# Patient Record
Sex: Male | Born: 1945
Health system: Southern US, Community
[De-identification: ages and names within clinical notes are randomized; demographics above are authoritative.]

## PROBLEM LIST (undated history)

## (undated) DIAGNOSIS — B20 Human immunodeficiency virus [HIV] disease: Secondary | ICD-10-CM

## (undated) DIAGNOSIS — C8590 Non-Hodgkin lymphoma, unspecified, unspecified site: Secondary | ICD-10-CM

## (undated) DIAGNOSIS — Z21 Asymptomatic human immunodeficiency virus [HIV] infection status: Secondary | ICD-10-CM

## (undated) DIAGNOSIS — A6920 Lyme disease, unspecified: Secondary | ICD-10-CM

## (undated) DIAGNOSIS — K900 Celiac disease: Secondary | ICD-10-CM

## (undated) HISTORY — DX: Celiac disease: K90.0

## (undated) HISTORY — DX: Human immunodeficiency virus (HIV) disease: B20

## (undated) HISTORY — DX: Asymptomatic human immunodeficiency virus (hiv) infection status: Z21

---

## 2015-06-14 DIAGNOSIS — H2510 Age-related nuclear cataract, unspecified eye: Secondary | ICD-10-CM | POA: Insufficient documentation

## 2016-04-19 DIAGNOSIS — L03211 Cellulitis of face: Secondary | ICD-10-CM | POA: Insufficient documentation

## 2016-04-19 HISTORY — DX: Cellulitis of face: L03.211

## 2016-04-20 DIAGNOSIS — N529 Male erectile dysfunction, unspecified: Secondary | ICD-10-CM | POA: Insufficient documentation

## 2016-04-20 DIAGNOSIS — E785 Hyperlipidemia, unspecified: Secondary | ICD-10-CM | POA: Insufficient documentation

## 2017-08-14 LAB — HM HEPATITIS C SCREENING LAB: HM Hepatitis Screen: NEGATIVE

## 2018-02-17 LAB — CBC AND DIFFERENTIAL
HCT: 30 — AB (ref 41–53)
Hemoglobin: 8.8 — AB (ref 13.5–17.5)

## 2018-02-17 LAB — IRON,TIBC AND FERRITIN PANEL
%SAT: 4
Iron: 17
TIBC: 425

## 2018-02-17 LAB — VITAMIN B12: Vitamin B-12: 229

## 2018-05-19 LAB — CBC AND DIFFERENTIAL
HCT: 37 — AB (ref 41–53)
Hemoglobin: 12 — AB (ref 13.5–17.5)
WBC: 3.8

## 2018-08-15 LAB — HM COLONOSCOPY

## 2018-08-19 LAB — CBC AND DIFFERENTIAL
HCT: 40 — AB (ref 41–53)
Hemoglobin: 13.8 (ref 13.5–17.5)
Platelets: 236 (ref 150–399)
WBC: 4.4

## 2018-08-19 LAB — TSH: TSH: 5.12 (ref 0.41–5.90)

## 2018-08-19 LAB — LIPID PANEL
Cholesterol: 159 (ref 0–200)
HDL: 31 — AB (ref 35–70)
LDL Cholesterol: 109
Triglycerides: 91 (ref 40–160)

## 2018-08-19 LAB — HEPATIC FUNCTION PANEL
ALT: 10 (ref 10–40)
AST: 21 (ref 14–40)
Alkaline Phosphatase: 82 (ref 25–125)
Bilirubin, Total: 0.5

## 2018-08-19 LAB — COMPREHENSIVE METABOLIC PANEL
Albumin: 3.5 (ref 3.5–5.0)
Calcium: 8.7 (ref 8.7–10.7)
Globulin: 3.5

## 2018-08-19 LAB — BASIC METABOLIC PANEL
BUN: 23 — AB (ref 4–21)
CO2: 27 — AB (ref 13–22)
Chloride: 108 (ref 99–108)
Creatinine: 1 (ref 0.6–1.3)
Glucose: 88
Potassium: 4.1 (ref 3.4–5.3)
Sodium: 141 (ref 137–147)

## 2018-08-19 LAB — PSA: PSA: 1.7

## 2020-02-18 ENCOUNTER — Ambulatory Visit: Payer: Self-pay | Admitting: Family Medicine

## 2020-02-24 ENCOUNTER — Ambulatory Visit: Payer: Self-pay | Admitting: Family Medicine

## 2020-03-01 ENCOUNTER — Telehealth: Payer: Self-pay

## 2020-03-01 NOTE — Telephone Encounter (Signed)
Copied from [facility] [phone] [id number]. Topic: General - Inquiry >> Feb 29, 2020  3:54 PM Gillis Ends D wrote: Reason for CRM: Patient believes that he has walking pneumonia and wants to know if it is something he can take besides advil. He asks that the nurse calls him back as soon as she can. Please advise

## 2020-03-01 NOTE — Telephone Encounter (Signed)
Called patient back he is new patient on 03/10/2020, informed him that we can not give any medical advise before seeing Physical but can get evaluated by Urgent care or can be seen sooner, he will wait till his appointment time for next week.

## 2020-03-01 NOTE — Telephone Encounter (Signed)
Thank you. Yes, we cannot treat a new patient before their appointment.  Urgent Care is an excellent place to diagnose and treat Walking Pneumonia if he has problem before 03/10/20  Nobie Putnam, [facility] 03/01/2020, 12:11 PM

## 2020-03-10 ENCOUNTER — Ambulatory Visit (INDEPENDENT_AMBULATORY_CARE_PROVIDER_SITE_OTHER): Payer: Medicare HMO | Admitting: Family Medicine

## 2020-03-10 ENCOUNTER — Telehealth: Payer: Self-pay | Admitting: Family Medicine

## 2020-03-10 ENCOUNTER — Encounter: Payer: Self-pay | Admitting: Family Medicine

## 2020-03-10 ENCOUNTER — Other Ambulatory Visit: Payer: Self-pay

## 2020-03-10 VITALS — BP 92/54 | HR 82 | Temp 97.5°F | Resp 16 | Ht 70.0 in | Wt 158.6 lb

## 2020-03-10 DIAGNOSIS — D508 Other iron deficiency anemias: Secondary | ICD-10-CM

## 2020-03-10 DIAGNOSIS — E559 Vitamin D deficiency, unspecified: Secondary | ICD-10-CM

## 2020-03-10 DIAGNOSIS — K900 Celiac disease: Secondary | ICD-10-CM

## 2020-03-10 DIAGNOSIS — E538 Deficiency of other specified B group vitamins: Secondary | ICD-10-CM | POA: Diagnosis not present

## 2020-03-10 DIAGNOSIS — R5382 Chronic fatigue, unspecified: Secondary | ICD-10-CM | POA: Diagnosis not present

## 2020-03-10 DIAGNOSIS — D649 Anemia, unspecified: Secondary | ICD-10-CM

## 2020-03-10 NOTE — Telephone Encounter (Signed)
Patient is calling to report that he received his [place] vaccine 09/21/19, 10/12/19 Please advise CB- [phone]

## 2020-03-10 NOTE — Patient Instructions (Addendum)
Thank you for coming to the office today.  Labs today, stay tuned for results.  We will advise based on the results how to treat - if there is an anemia or deficiency  Probably regardless of result we can refer - but once we see results we will let you know.  [facility]) [place], [place] [address] Phone: [phone]  [facility]) [place]. [place], [place] [address] Main: [phone]  Please schedule a Follow-up Appointment to: Return in about 3 months (around 06/10/2020) for 3 month follow-up fatigue / celiac / GI / iron deficiency.  If you have any other questions or concerns, please feel free to call the office or send a message through [facility]. You may also schedule an earlier appointment if necessary.  Additionally, you may be receiving a survey about your experience at our office within a few days to 1 week by e-mail or mail. We value your feedback.  Justin Putnam, DO [place]

## 2020-03-10 NOTE — Telephone Encounter (Signed)
Patient is calling to ask has he signed a release for medical records? Patient is request records to request from his previous office [facility] Fax Number [phone] Telephone number- [phone]

## 2020-03-10 NOTE — Progress Notes (Signed)
Subjective:    Patient ID: Justin Wallace, male    DOB: 02/26/1946, 74 y.o.   MRN: [id number]  Justin Wallace is a 74 y.o. male presenting on 03/10/2020 for Fever (as per patient had self diagnosed for walking pneumonia since had low grade fever, chills, bodyache, occ cough, HA but don't last long denies sore throat onset 2 weeks getting improve from yesterday)  Moved from [place], has children/grandkids in this area.  HPI   Fatigue / Reduced Energy Follow-up Low Grade Fever / Chills Bodyache Cough  He was tested NEGATIVE for COVID19 2 weeks ago after not feeling well with symptoms He felt generalized malaise and fatigue, soreness, aching, reduced energy - the respiratory symptoms were very mild or minor, and he does not endorse significant cough or shortness of breath. Yesterday symptoms started feeling better He says self diagnosed based on prior history of "walking pneumonia" in past. However, he seems to endorse more chronic symptoms, Gradual over past 3 years, some decline in his function, he is very active, plays pickleball, and outdoor activities with yardwork  Mild low BP, usually lower but this seems lower than before.  Celiac Disease Weight Loss Diagnosed by [facility] back in 07/2018 on colonoscopy with biopsies and lab confirm. Also had EGD at that time with biopsy see results below. Has major changes to his diet, with gluten free overall He is interested to see GI locally now prefers [place].   Health Maintenance:  Colon CA Screening - see above  COVID19 vaccine UTD, Pfizer - will submit dates to [place] when available.  Screening Hepatitis C antibody was negative 08/14/17 - [facility]  Depression screen PHQ 2/9 03/10/2020  Decreased Interest 0  Down, Depressed, Hopeless 0  PHQ - 2 Score 0    Past Medical History:  Diagnosis Date  . Celiac disease    History reviewed. No pertinent surgical history. Social History   Socioeconomic History  . Marital status: Married    Spouse  name: Not on file  . Number of children: Not on file  . Years of education: Not on file  . Highest education level: Not on file  Occupational History  . Not on file  Tobacco Use  . Smoking status: Never Smoker  . Smokeless tobacco: Never Used  Substance and Sexual Activity  . Alcohol use: Not Currently  . Drug use: Never  . Sexual activity: Never  Other Topics Concern  . Not on file  Social History Narrative  . Not on file   Social Determinants of Health   Financial Resource Strain:   . Difficulty of Paying Living Expenses:   Food Insecurity:   . Worried About Charity fundraiser in the Last Year:   . Arboriculturist in the Last Year:   Transportation Needs:   . Film/video editor (Medical):   Marland Kitchen Lack of Transportation (Non-Medical):   Physical Activity:   . Days of Exercise per Week:   . Minutes of Exercise per Session:   Stress:   . Feeling of Stress :   Social Connections:   . Frequency of Communication with Friends and Family:   . Frequency of Social Gatherings with Friends and Family:   . Attends Religious Services:   . Active Member of Clubs or Organizations:   . Attends Archivist Meetings:   Marland Kitchen Marital Status:   Intimate Partner Violence:   . Fear of Current or Ex-Partner:   . Emotionally Abused:   Marland Kitchen Physically  Abused:   . Sexually Abused:    History reviewed. No pertinent family history. Current Outpatient Medications on File Prior to Visit  Medication Sig  . ferrous sulfate 325 (65 FE) MG EC tablet Take by mouth.  . Multiple Vitamin (THERA) TABS Take 1 tablet by mouth daily.  . tadalafil (CIALIS) 20 MG tablet May take 1/2 to 1 tablet as needed.   No current facility-administered medications on file prior to visit.    Review of Systems Per HPI unless specifically indicated above      Objective:    BP (!) 92/54   Pulse 82   Temp (!) 97.5 F (36.4 C) (Temporal)   Resp 16   Ht 5' 10"  (1.778 m)   Wt 158 lb 9.6 oz (71.9 kg)   SpO2  99%   BMI 22.76 kg/m   Wt Readings from Last 3 Encounters:  03/10/20 158 lb 9.6 oz (71.9 kg)    Physical Exam Vitals and nursing note reviewed.  Constitutional:      General: He is not in acute distress.    Appearance: He is well-developed. He is not diaphoretic.     Comments: Well-appearing, comfortable, cooperative  HENT:     Head: Normocephalic and atraumatic.  Eyes:     General:        Right eye: No discharge.        Left eye: No discharge.     Conjunctiva/sclera: Conjunctivae normal.  Neck:     Thyroid: No thyromegaly.  Cardiovascular:     Rate and Rhythm: Normal rate and regular rhythm.     Heart sounds: Murmur (1-2 / 6 left sternal border systolic, some radiation to L carotid) heard.   Pulmonary:     Effort: Pulmonary effort is normal. No respiratory distress.     Breath sounds: Normal breath sounds. No wheezing or rales.  Musculoskeletal:        General: Normal range of motion.     Cervical back: Normal range of motion and neck supple.     Right lower leg: No edema.     Left lower leg: No edema.  Lymphadenopathy:     Cervical: No cervical adenopathy.  Skin:    General: Skin is warm and dry.     Findings: No erythema or rash.  Neurological:     Mental Status: He is alert and oriented to person, place, and time.  Psychiatric:        Behavior: Behavior normal.     Comments: Well groomed, good eye contact, normal speech and thoughts         Results for orders placed or performed in visit on 03/10/20  HM HEPATITIS C SCREENING LAB  Result Value Ref Range   HM Hepatitis Screen Negative-Validated   HM COLONOSCOPY  Result Value Ref Range   HM Colonoscopy See Report (in chart) See Report (in chart), Patient Reported       Colonoscopy  Anatomical Region Laterality Modality  -- -- Endoscopy  Impression  External hemorrhoids.   Procedure: Colonoscopy   Date of procedure: 08/15/2018 Physician: Sharen Hint H: Proceduralist   Recommendations:   Pending path from upper GI endoscopy may need small bowel capsule exam    Findings:  All observed locations appeared normal.  Protruding and external hemorrhoids with no bleeding       Procedure Information   Procedure Details: The patient underwent general anesthesia, which was  administered by an anesthesia professional. The patient's blood pressure,  heart  rate, level of consciousness, oxygen and respirations were monitored  throughout the procedure. A digital rectal exam was performed. The scope  was introduced through the anus and advanced to the terminal ileum.  Photodocumentation was obtained at the ileocecal valve, appendiceal  orifice and terminal ileum. Retroflexion was performed in the cecum. Bowel  prep was adequate. The patient experienced no blood loss. The procedure  was not difficult. The patient tolerated the procedure well.There were no  apparent complications.   Scope Times:  Insertion Time: 11m59s  Withdrawal Time: 720m4s   Nurse-administered sedation medications (excludes anesthesia  provider-administered medications) (Filter: Administrations occurring from  08/15/18 0827 to 08/15/18 1007) As of 08/15/18 1007   LR infusion (mL/hr) Total volume: Not documented*  *Total volume has not been documented. View each administration to see the  amount administered.   Dosing weight: 77.1 kg  Dose Action RouteAdmin Date/Time  Admin User  25 mL/hr New Bag IntraVENous 08/15/18 0909 SyRoxanne GatesRN    --------------------------------------------------  Specimens:  ID Type Source Tests Collected by Time  1 : Bulbar Polyps bx Tissue Duodenum, Biopsy PATHOLOGY TISSUE REQUEST JoEustaquio MaizeMD 08/15/2018 9:43 AM  2 : (R/O H Pylori) Tissue Gastric Antrum, Biopsy PATHOLOGY TISSUE REQUEST  JoEustaquio MaizeMD 08/15/2018 9:44 AM    Instrument/Scope:  GATheophilus [id number] [id number] Pathology Tissue  Request Specimen:  Tissue - Duodenal structure (body structure), Tissue specimen (specimen) - Stomach structure (bod... Component 1 yr ago  Case Report  Surgical Pathology                Case: [id number]                  Authorizing Provider: JoEustaquio MaizeMD    Collected:      08/15/2018 0943        [facility]  Received:      08/15/2018 1023        Pathologist:      MiJeral Pinch                                     MD                                      Specimens:  1) - Duodenum, Biopsy, Bulbar Polyps bx                               2) - Gastric Antrum, Biopsy, (R/O H Pylori)                         Addendum 1    A Helicobacter pylori immunostain is negative for organisms.  Addendum electronically signed by MiJeral PinchMD on 08/19/2018 at 11:56 AM  Final Diagnosis    1.  Duodenum, biopsy: --Increased intraepithelial lymphocytes with villous blunting and prominent lymphoid aggregates with focal giant cells (see comment).    2.  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 --A Helicobacter pylori immunostain will be performed with the results to be reported in an addendum.  Electronically signed by MiJeral PinchMD  on 08/18/2018 at 2:30 PM    Celiac Ab tTG DGP TIgA   Ref Range & Units 1 yr ago Comments  IgA 61 - 437 mg/dL 436        Deamidated Gliadin Abs, IgA 0 - 19 units 4           Negative          0 - 19           Weak Positive       20 - 30           Moderate to Strong Positive  >30      Deamidated Gliadin Abs, IgG 0 - 19 units 3           Negative          0 - 19           Weak Positive        20 - 30           Moderate to Strong Positive  >30      TTG IgA 0 - 3 U/mL <2                Negative    0 - 3                Weak Positive  4 - 10                Positive      >10  Tissue Transglutaminase (tTG) has been identified  as the endomysial antigen. Studies have demonstr-  ated that endomysial IgA antibodies have over 99%  specificity for gluten sensitive enteropathy.      Tissue Transglut Ab 0 - 5 U/mL 14High                Negative    0 - 5                Weak Positive  6 - 9                Positive      >9      Resulting Agency  [facility]   Narrative Performed by [facility] Performed at: [facility]  [address], [place], [place] [id number]  Lab Director: Rush Farmer MD, Phone: [phone] Specimen Collected: 08/26/18 8:14 AM     Assessment & Plan:   Problem List Items Addressed This Visit    None    Visit Diagnoses    Celiac disease    -  Primary   Relevant Orders   CBC with Differential/Platelet   COMPLETE METABOLIC PANEL WITH GFR   Iron, TIBC and Ferritin Panel   Vitamin B12   VITAMIN D 25 Hydroxy (Vit-D Deficiency, Fractures)   Folate   Other iron deficiency anemia       Relevant Medications   ferrous sulfate 325 (65 FE) MG EC tablet   Other Relevant Orders   Iron, TIBC and Ferritin Panel   Vitamin B12   Folate   Vitamin D deficiency       Relevant Orders   VITAMIN D 25 Hydroxy (Vit-D Deficiency, Fractures)   Vitamin B12 nutritional deficiency       Relevant Orders   Vitamin B12   Folate   Chronic fatigue       Relevant Orders   CBC with Differential/Platelet   COMPLETE METABOLIC PANEL WITH GFR      Request records from  prior PCP Dr Scarlette Ar, who has relocated after leaving [place] in 2019.  Clinical constellation of fatigue, reduced energy, also some initially reported mild  respiratory symptoms however those have resolved. He had NEGATIVE COVID19 test recently. UTD on COVID vaccination [employer]), some of his symptoms seem to be more chronic and gradual in nature.  Based on prior history Celiac disease dx in 2020 on colonoscopy biopsy/testing, he has concerns with iron deficiency anemia, prior results in recent history have shown significant anemia Hgb 9-10 range by report.  No prior documentation on nutritional vitamin deficiency.  No sign of active respiratory disease at this time. Afebrile.  Plan - Initiate lab work up today for source of his symptoms, no recent comparison labs, will check CMET CBC, Anemia Panel, Vitamin B12 and Folate, Vitamin D  Based on results, will determine if any significant nutritional deficiency or anemia and will treat accordingly, anticipate referral to [facility] once labs are reviewed given history of Celiac and some persistent fatigue. Prior GI record notes he may warrant capsule endoscopy in future.  #Possible heart murmur Identified possible new heart murmur today on exam Will follow-up future exams, based on rest of exam and history seems unlikely to be cardiovascular etiology of his current symptoms. Defer further cardiac work up today.  No orders of the defined types were placed in this encounter.  Orders Placed This Encounter  Procedures  . CBC with Differential/Platelet  . COMPLETE METABOLIC PANEL WITH GFR  . Iron, TIBC and Ferritin Panel  . Vitamin B12  . VITAMIN D 25 Hydroxy (Vit-D Deficiency, Fractures)  . Folate  . HM HEPATITIS C SCREENING LAB    This external order was created through the Results Console.  Marland Kitchen HM COLONOSCOPY    This external order was created through the Results Console.    Follow up plan: Return in about 3 months (around 06/10/2020) for 3 month follow-up fatigue / celiac / GI / iron deficiency.  Nobie Putnam, [facility] 03/10/2020,  10:36 AM

## 2020-03-11 LAB — COMPLETE METABOLIC PANEL WITH GFR
AG Ratio: 0.7 (calc) — ABNORMAL LOW (ref 1.0–2.5)
ALT: 13 U/L (ref 9–46)
AST: 20 U/L (ref 10–35)
Albumin: 2.9 g/dL — ABNORMAL LOW (ref 3.6–5.1)
Alkaline phosphatase (APISO): 141 U/L (ref 35–144)
BUN: 23 mg/dL (ref 7–25)
CO2: 27 mmol/L (ref 20–32)
Calcium: 8.4 mg/dL — ABNORMAL LOW (ref 8.6–10.3)
Chloride: 106 mmol/L (ref 98–110)
Creat: 0.91 mg/dL (ref 0.70–1.18)
GFR, Est African American: 98 mL/min/{1.73_m2} (ref >=60)
GFR, Est Non African American: 84 mL/min/{1.73_m2} (ref >=60)
Globulin: 3.9 g/dL (calc) — ABNORMAL HIGH (ref 1.9–3.7)
Glucose, Bld: 94 mg/dL (ref 65–99)
Potassium: 4.8 mmol/L (ref 3.5–5.3)
Sodium: 138 mmol/L (ref 135–146)
Total Bilirubin: 0.4 mg/dL (ref 0.2–1.2)
Total Protein: 6.8 g/dL (ref 6.1–8.1)

## 2020-03-11 LAB — VITAMIN B12: Vitamin B-12: 302 pg/mL (ref 200–1100)

## 2020-03-11 LAB — CBC WITH DIFFERENTIAL/PLATELET
Absolute Monocytes: 706 cells/uL (ref 200–950)
Basophils Absolute: 49 cells/uL (ref 0–200)
Basophils Relative: 1 %
Eosinophils Absolute: 78 cells/uL (ref 15–500)
Eosinophils Relative: 1.6 %
HCT: 40.9 % (ref 38.5–50.0)
Hemoglobin: 13.1 g/dL — ABNORMAL LOW (ref 13.2–17.1)
Lymphs Abs: 931 cells/uL (ref 850–3900)
MCH: 28.2 pg (ref 27.0–33.0)
MCHC: 32 g/dL (ref 32.0–36.0)
MCV: 88 fL (ref 80.0–100.0)
MPV: 10.5 fL (ref 7.5–12.5)
Monocytes Relative: 14.4 %
Neutro Abs: 3136 cells/uL (ref 1500–7800)
Neutrophils Relative %: 64 %
Platelets: 349 10*3/uL (ref 140–400)
RBC: 4.65 10*6/uL (ref 4.20–5.80)
RDW: 12 % (ref 11.0–15.0)
Total Lymphocyte: 19 %
WBC: 4.9 10*3/uL (ref 3.8–10.8)

## 2020-03-11 LAB — VITAMIN D 25 HYDROXY (VIT D DEFICIENCY, FRACTURES): Vit D, 25-Hydroxy: 43 ng/mL (ref 30–100)

## 2020-03-11 LAB — IRON,TIBC AND FERRITIN PANEL
%SAT: 8 % (calc) — ABNORMAL LOW (ref 20–48)
Ferritin: 218 ng/mL (ref 24–380)
Iron: 24 ug/dL — ABNORMAL LOW (ref 50–180)
TIBC: 291 mcg/dL (calc) (ref 250–425)

## 2020-03-11 LAB — FOLATE: Folate: 6.1 ng/mL

## 2020-03-11 NOTE — Addendum Note (Signed)
Addended by: Olin Hauser on: 03/11/2020 07:07 PM   Modules accepted: Orders

## 2020-03-15 NOTE — Telephone Encounter (Signed)
Copied from [facility] [id number]. Topic: General - Other >> Mar 15, 2020 12:40 PM Leward Quan A wrote: Reason for CRM: Patient called to inform Dr Raliegh Ip that the GI Dr that he was referred to will not be able to see him until October. He states that he is not getting any better say he is still weak and would love to see someone this week or next. He is asking Dr Raliegh Ip for what ever help he can give Ph# [phone]

## 2020-03-15 NOTE — Telephone Encounter (Signed)
I called patient.  He has symptomatic anemia with iron deficiency.  His B12 and Folate were normal.  I advised him he should keep his new patient apt with Dr Allen Norris on 05/18/20 for GI consultation for Celiac.  However, he should get a consultation sooner now with Hematology for his anemia, perhaps that is one of main causes of his weakness and they can give [place] further advice and treat his current problem.  I advised for now, we will hold off on switching GI, but we can consider switching it if he absolutely needs it.  Referral sent to [facility] - he will take first available [facility] or [place] site.  Nobie Putnam, [facility] 03/15/2020, 6:04 PM

## 2020-03-17 ENCOUNTER — Inpatient Hospital Stay: Payer: Medicare HMO

## 2020-03-17 ENCOUNTER — Encounter: Payer: Self-pay | Admitting: Oncology

## 2020-03-17 ENCOUNTER — Other Ambulatory Visit: Payer: Self-pay

## 2020-03-17 ENCOUNTER — Inpatient Hospital Stay: Payer: Medicare HMO | Attending: Oncology | Admitting: Oncology

## 2020-03-17 VITALS — BP 102/56 | HR 82 | Temp 98.8°F | Wt 153.6 lb

## 2020-03-17 DIAGNOSIS — D649 Anemia, unspecified: Secondary | ICD-10-CM

## 2020-03-17 DIAGNOSIS — R5383 Other fatigue: Secondary | ICD-10-CM

## 2020-03-17 DIAGNOSIS — R634 Abnormal weight loss: Secondary | ICD-10-CM | POA: Insufficient documentation

## 2020-03-17 DIAGNOSIS — R509 Fever, unspecified: Secondary | ICD-10-CM | POA: Diagnosis not present

## 2020-03-17 DIAGNOSIS — K900 Celiac disease: Secondary | ICD-10-CM | POA: Insufficient documentation

## 2020-03-17 DIAGNOSIS — R519 Headache, unspecified: Secondary | ICD-10-CM | POA: Diagnosis not present

## 2020-03-17 LAB — RETIC PANEL
Immature Retic Fract: 1.3 % — ABNORMAL LOW (ref 2.3–15.9)
RBC.: 4.55 MIL/uL (ref 4.22–5.81)
Retic Count, Absolute: 41 10*3/uL (ref 19.0–186.0)
Retic Ct Pct: 0.9 % (ref 0.4–3.1)
Reticulocyte Hemoglobin: 30.2 pg (ref >27.9)

## 2020-03-17 LAB — CBC WITH DIFFERENTIAL/PLATELET
Abs Immature Granulocytes: 0.09 10*3/uL — ABNORMAL HIGH (ref 0.00–0.07)
Basophils Absolute: 0.1 10*3/uL (ref 0.0–0.1)
Basophils Relative: 1 %
Eosinophils Absolute: 0.1 10*3/uL (ref 0.0–0.5)
Eosinophils Relative: 1 %
HCT: 38.2 % — ABNORMAL LOW (ref 39.0–52.0)
Hemoglobin: 13 g/dL (ref 13.0–17.0)
Immature Granulocytes: 1 %
Lymphocytes Relative: 15 %
Lymphs Abs: 1 10*3/uL (ref 0.7–4.0)
MCH: 29 pg (ref 26.0–34.0)
MCHC: 34 g/dL (ref 30.0–36.0)
MCV: 85.3 fL (ref 80.0–100.0)
Monocytes Absolute: 0.8 10*3/uL (ref 0.1–1.0)
Monocytes Relative: 12 %
Neutro Abs: 4.7 10*3/uL (ref 1.7–7.7)
Neutrophils Relative %: 70 %
Platelets: 337 10*3/uL (ref 150–400)
RBC: 4.48 MIL/uL (ref 4.22–5.81)
RDW: 13 % (ref 11.5–15.5)
WBC: 6.8 10*3/uL (ref 4.0–10.5)
nRBC: 0 % (ref 0.0–0.2)

## 2020-03-17 LAB — COMPREHENSIVE METABOLIC PANEL
ALT: 18 U/L (ref 0–44)
AST: 24 U/L (ref 15–41)
Albumin: 2.7 g/dL — ABNORMAL LOW (ref 3.5–5.0)
Alkaline Phosphatase: 140 U/L — ABNORMAL HIGH (ref 38–126)
Anion gap: 6 (ref 5–15)
BUN: 21 mg/dL (ref 8–23)
CO2: 27 mmol/L (ref 22–32)
Calcium: 8.1 mg/dL — ABNORMAL LOW (ref 8.9–10.3)
Chloride: 104 mmol/L (ref 98–111)
Creatinine, Ser: 1.09 mg/dL (ref 0.61–1.24)
GFR calc Af Amer: >60 mL/min (ref >60)
GFR calc non Af Amer: >60 mL/min (ref >60)
Glucose, Bld: 97 mg/dL (ref 70–99)
Potassium: 3.8 mmol/L (ref 3.5–5.1)
Sodium: 137 mmol/L (ref 135–145)
Total Bilirubin: 0.3 mg/dL (ref 0.3–1.2)
Total Protein: 8 g/dL (ref 6.5–8.1)

## 2020-03-17 LAB — LACTATE DEHYDROGENASE: LDH: 113 U/L (ref 98–192)

## 2020-03-17 LAB — HEPATITIS PANEL, ACUTE
HCV Ab: NONREACTIVE
Hep A IgM: NONREACTIVE
Hep B C IgM: NONREACTIVE
Hepatitis B Surface Ag: NONREACTIVE

## 2020-03-17 LAB — IRON AND TIBC
Iron: 22 ug/dL — ABNORMAL LOW (ref 45–182)
Saturation Ratios: 8 % — ABNORMAL LOW (ref 17.9–39.5)
TIBC: 286 ug/dL (ref 250–450)
UIBC: 264 ug/dL

## 2020-03-17 LAB — FERRITIN: Ferritin: 284 ng/mL (ref 24–336)

## 2020-03-17 LAB — TECHNOLOGIST SMEAR REVIEW: Plt Morphology: ADEQUATE

## 2020-03-17 LAB — TSH: TSH: 4.776 u[IU]/mL — ABNORMAL HIGH (ref 0.350–4.500)

## 2020-03-17 NOTE — Progress Notes (Signed)
Patient here to establish care. Pt reports having severe fatigue and stomach cramps.

## 2020-03-17 NOTE — Progress Notes (Signed)
Hematology/Oncology Consult note [facility] [phone] [phone]   Patient Care Team: Olin Hauser, DO as PCP - General (Family Medicine)  REFERRING PROVIDER: Nobie Putnam *  CHIEF COMPLAINTS/REASON FOR VISIT:  Evaluation of symptomatic anemia  HISTORY OF PRESENTING ILLNESS:   Justin Wallace is a  74 y.o.  male with PMH listed below was seen in consultation at the request of  Nobie Putnam *  for evaluation of symptomatic anemia Patient reported history of sudden onset of diffuse body aches, decreased appetite, headache low-grade fever, profound weakness/fatigue about 2 weeks ago.  Prior to the onset of symptoms, he went to a funeral as well as applicable clinic.  He reports that he has received COVID-19 vaccination previously.  He moved from [place].  Daughter is an Therapist, sports and told him to get COVID-19 PCR checked and he was tested negative. Patient also reports a sudden drop of weight since the onset of symptoms.  Patient has a history of celiac disease.  No colonoscopy records or pathology records in current EMR.  Diagnosis was done by [facility] gastroenterology.  Patient reports that he was in his usual state of health until the onset of symptoms.  03/10/2020, patient was evaluated by primary care provider.  Blood work showed mild anemia with hemoglobin of 13.1, Iron panel showed decreased saturation of 8, ferritin 218, TIBC 291, patient was referred to hematology for evaluation of symptomatic anemia and iron deficiency.  Patient reports that her body aches, fever and headache symptoms have improved.  However he continues to feel very tired and fatigued.  Appetite has decreased and weight remains low.   Review of Systems  Constitutional: Positive for fatigue. Negative for appetite change, chills, fever and unexpected weight change.  HENT:   Negative for hearing loss and voice change.   Eyes: Negative for eye problems  and icterus.  Respiratory: Negative for chest tightness, cough and shortness of breath.   Cardiovascular: Negative for chest pain and leg swelling.  Gastrointestinal: Negative for abdominal distention and abdominal pain.  Endocrine: Negative for hot flashes.  Genitourinary: Negative for difficulty urinating, dysuria and frequency.   Musculoskeletal: Negative for arthralgias.  Skin: Negative for itching and rash.  Neurological: Negative for light-headedness and numbness.  Hematological: Negative for adenopathy. Does not bruise/bleed easily.  Psychiatric/Behavioral: Negative for confusion.    MEDICAL HISTORY:  Past Medical History:  Diagnosis Date  . Celiac disease     SURGICAL HISTORY: History reviewed. No pertinent surgical history.  SOCIAL HISTORY: Social History   Socioeconomic History  . Marital status: Married    Spouse name: Not on file  . Number of children: Not on file  . Years of education: Not on file  . Highest education level: Not on file  Occupational History  . Not on file  Tobacco Use  . Smoking status: Never Smoker  . Smokeless tobacco: Never Used  Vaping Use  . Vaping Use: Never used  Substance and Sexual Activity  . Alcohol use: Not Currently  . Drug use: Never  . Sexual activity: Never  Other Topics Concern  . Not on file  Social History Narrative  . Not on file   Social Determinants of Health   Financial Resource Strain:   . Difficulty of Paying Living Expenses: Not on file  Food Insecurity:   . Worried About Charity fundraiser in the Last Year: Not on file  . Ran Out of Food in the Last Year: Not on file  Transportation Needs:   . Film/video editor (Medical): Not on file  . Lack of Transportation (Non-Medical): Not on file  Physical Activity:   . Days of Exercise per Week: Not on file  . Minutes of Exercise per Session: Not on file  Stress:   . Feeling of Stress : Not on file  Social Connections:   . Frequency of Communication  with Friends and Family: Not on file  . Frequency of Social Gatherings with Friends and Family: Not on file  . Attends Religious Services: Not on file  . Active Member of Clubs or Organizations: Not on file  . Attends Archivist Meetings: Not on file  . Marital Status: Not on file  Intimate Partner Violence:   . Fear of Current or Ex-Partner: Not on file  . Emotionally Abused: Not on file  . Physically Abused: Not on file  . Sexually Abused: Not on file    FAMILY HISTORY: Family History  Problem Relation Age of Onset  . Hyperlipidemia Mother   . Brain cancer Father     ALLERGIES:  has No Known Allergies.  MEDICATIONS:  Current Outpatient Medications  Medication Sig Dispense Refill  . ferrous sulfate 325 (65 FE) MG EC tablet Take 325 mg by mouth daily with breakfast.     . Multiple Vitamin (THERA) TABS Take 1 tablet by mouth daily.    . tadalafil (CIALIS) 20 MG tablet May take 1/2 to 1 tablet as needed.     No current facility-administered medications for this visit.     PHYSICAL EXAMINATION: ECOG PERFORMANCE STATUS: 1 - Symptomatic but completely ambulatory Vitals:   03/17/20 0921  BP: (!) 102/56  Pulse: 82  Temp: 98.8 F (37.1 C)  SpO2: 98%   Filed Weights   03/17/20 0921  Weight: 153 lb 9.6 oz (69.7 kg)    Physical Exam Constitutional:      General: He is not in acute distress. HENT:     Head: Normocephalic and atraumatic.  Eyes:     General: No scleral icterus. Cardiovascular:     Rate and Rhythm: Normal rate and regular rhythm.     Heart sounds: Normal heart sounds.  Pulmonary:     Effort: Pulmonary effort is normal. No respiratory distress.     Breath sounds: No wheezing.  Abdominal:     General: Bowel sounds are normal. There is no distension.     Palpations: Abdomen is soft.  Musculoskeletal:        General: No deformity. Normal range of motion.     Cervical back: Normal range of motion and neck supple.     Comments: Left axillary  enlarged lymph node.   Skin:    General: Skin is warm and dry.     Findings: No erythema or rash.  Neurological:     Mental Status: He is alert and oriented to person, place, and time. Mental status is at baseline.     Cranial Nerves: No cranial nerve deficit.     Coordination: Coordination normal.  Psychiatric:        Mood and Affect: Mood normal.     LABORATORY DATA:  I have reviewed the data as listed Lab Results  Component Value Date   WBC 4.9 03/10/2020   HGB 13.1 (L) 03/10/2020   HCT 40.9 03/10/2020   MCV 88.0 03/10/2020   PLT 349 03/10/2020   Recent Labs    03/10/20 1113  NA 138  K 4.8  CL 106  CO2 27  GLUCOSE 94  BUN 23  CREATININE 0.91  CALCIUM 8.4*  GFRNONAA 84  GFRAA 98  PROT 6.8  AST 20  ALT 13  BILITOT 0.4   Iron/TIBC/Ferritin/ %Sat    Component Value Date/Time   IRON 24 (L) 03/10/2020 1113   TIBC 291 03/10/2020 1113   FERRITIN 218 03/10/2020 1113   IRONPCTSAT 8 (L) 03/10/2020 1113      RADIOGRAPHIC STUDIES: I have personally reviewed the radiological images as listed and agreed with the findings in the report. No results found.    ASSESSMENT & PLAN:  1. Anemia, unspecified type   2. Other fatigue   3. Weight loss    # Anemia, very mild. Hb is 13.1 and I doubt that his fatigue is secondary to this mild level of anemia.  Iron panel also is not typical for iron deficiency, with ferritin >200, decreased iron saturation but normal TIBC, more consistent with inflammation.  Repeat cbc, cmp iron tibc ferritin.   # fatigue/weight loss, etiology unknown.  From his history, it appears that he was at usual state of health until sudden onset of symptoms, more likely due to any acute infection/inflammation.  Check TSH, LDH, hepatitis panel. Recommend pt to further discuss with primary care provide for evaluation of infection/inflammation.   # left axillary mass.  Will need to ask the time frame of his COVID 19 vaccination and which arm he gets.    [place] axillary for further characterization. If we believe this is due to vaccination, may hold off eval.   # celiac disease, recommend pt to continue to establish care with local GI physician.   Orders Placed This Encounter  Procedures  . [place] UPPER EXTREMITY DUPLEX LEFT (NON-WBI)    Standing Status:   Future    Standing Expiration Date:   03/17/2021    Order Specific Question:   Reason for Exam (SYMPTOM  OR DIAGNOSIS REQUIRED)    Answer:   left axillary nod    Order Specific Question:   Preferred imaging location?    Answer:    [facility]  . CBC with Differential/Platelet    Standing Status:   Future    Number of Occurrences:   1    Standing Expiration Date:   03/17/2021  . Comprehensive metabolic panel    Standing Status:   Future    Number of Occurrences:   1    Standing Expiration Date:   03/17/2021  . TSH    Standing Status:   Future    Number of Occurrences:   1    Standing Expiration Date:   03/17/2021  . Hepatitis panel, acute    Standing Status:   Future    Number of Occurrences:   1    Standing Expiration Date:   03/17/2021  . Lactate dehydrogenase    Standing Status:   Future    Number of Occurrences:   1    Standing Expiration Date:   03/17/2021  . Retic Panel    Standing Status:   Future    Number of Occurrences:   1    Standing Expiration Date:   03/17/2021  . Ferritin    Standing Status:   Future    Number of Occurrences:   1    Standing Expiration Date:   09/17/2020  . Iron and TIBC    Standing Status:   Future    Number of Occurrences:   1    Standing Expiration Date:  03/17/2021  . Technologist smear review    Standing Status:   Future    Number of Occurrences:   1    Standing Expiration Date:   03/17/2021    All questions were answered. The patient knows to call the clinic with any problems questions or concerns.  cc Nobie Putnam *    Return of visit: 2 weeks.  Thank you for this kind referral and the opportunity to participate in the  care of this patient. A copy of today's note is routed to referring provider    Earlie Server, MD, PhD Hematology Oncology [facility] at [facility] Pager- [phone] 03/17/2020

## 2020-03-22 ENCOUNTER — Encounter: Payer: Self-pay | Admitting: Oncology

## 2020-03-22 ENCOUNTER — Other Ambulatory Visit: Payer: Self-pay | Admitting: Oncology

## 2020-03-22 ENCOUNTER — Telehealth: Payer: Self-pay | Admitting: *Deleted

## 2020-03-22 DIAGNOSIS — D649 Anemia, unspecified: Secondary | ICD-10-CM

## 2020-03-22 NOTE — Telephone Encounter (Signed)
Did they give details of any symptoms that he is currently having?

## 2020-03-22 NOTE — Telephone Encounter (Signed)
It was a message and I believe she was more upset that she could not see the results in MyChart and was expecting a call from our office and wants something done for him. She just said that he is very sick at the end of her message

## 2020-03-22 NOTE — Telephone Encounter (Signed)
Wife called requesting test results stating they were told they would get a call from our office with them. His next appointment is 9/2. She states her husband is very sick and they need to know what is going on. Please return call to her

## 2020-03-22 NOTE — Telephone Encounter (Signed)
Patient reports he has no energy.  Informed him of Dr. Tasia Catchings recommendation that his lab showed normal hemoglobin, no critical resutls.  He has borderline TSH, elevated ALK. MD is waiting for his [place] axilla to be done (scheduled tomorrow). Dr. Tasia Catchings would like for him to have additional labs of free T4, and GGT, hepatitis.  Will add him to lab scheduled tomorrow after [place].

## 2020-03-23 ENCOUNTER — Ambulatory Visit
Admission: RE | Admit: 2020-03-23 | Discharge: 2020-03-23 | Disposition: A | Discharge status: Home / Self Care | Payer: Medicare HMO | Source: Ambulatory Visit | Attending: Oncology | Admitting: Oncology

## 2020-03-23 ENCOUNTER — Other Ambulatory Visit: Payer: Self-pay

## 2020-03-23 ENCOUNTER — Other Ambulatory Visit
Admission: RE | Admit: 2020-03-23 | Discharge: 2020-03-23 | Disposition: A | Discharge status: Home / Self Care | Payer: Medicare HMO | Source: Home / Self Care | Attending: Oncology | Admitting: Oncology

## 2020-03-23 ENCOUNTER — Inpatient Hospital Stay: Payer: Medicare HMO

## 2020-03-23 DIAGNOSIS — R5383 Other fatigue: Secondary | ICD-10-CM

## 2020-03-23 DIAGNOSIS — R222 Localized swelling, mass and lump, trunk: Secondary | ICD-10-CM | POA: Insufficient documentation

## 2020-03-23 DIAGNOSIS — R2232 Localized swelling, mass and lump, left upper limb: Secondary | ICD-10-CM | POA: Diagnosis not present

## 2020-03-23 DIAGNOSIS — D649 Anemia, unspecified: Secondary | ICD-10-CM

## 2020-03-23 DIAGNOSIS — R2242 Localized swelling, mass and lump, left lower limb: Secondary | ICD-10-CM | POA: Diagnosis not present

## 2020-03-23 DIAGNOSIS — R634 Abnormal weight loss: Secondary | ICD-10-CM | POA: Diagnosis not present

## 2020-03-23 DIAGNOSIS — R531 Weakness: Secondary | ICD-10-CM | POA: Diagnosis not present

## 2020-03-23 LAB — GAMMA GT: GGT: 23 U/L (ref 7–50)

## 2020-03-23 LAB — T4, FREE: Free T4: 1.01 ng/dL (ref 0.61–1.12)

## 2020-03-24 ENCOUNTER — Encounter: Payer: Self-pay | Admitting: Family Medicine

## 2020-03-24 ENCOUNTER — Telehealth: Payer: Self-pay | Admitting: *Deleted

## 2020-03-24 DIAGNOSIS — R5383 Other fatigue: Secondary | ICD-10-CM

## 2020-03-24 DIAGNOSIS — R2232 Localized swelling, mass and lump, left upper limb: Secondary | ICD-10-CM

## 2020-03-24 DIAGNOSIS — R2231 Localized swelling, mass and lump, right upper limb: Secondary | ICD-10-CM

## 2020-03-24 DIAGNOSIS — R634 Abnormal weight loss: Secondary | ICD-10-CM

## 2020-03-24 NOTE — Telephone Encounter (Signed)
Please let him know that his US showed a axillary mass. I recommend additional work up with CT chest w contrast. Thank you. If he agrees, please arrange. If he has questions, please move his follow up appt. Virtual visit is ok if he is able to do.

## 2020-03-24 NOTE — Telephone Encounter (Signed)
Called Report  Study Result  Narrative & Impression  CLINICAL DATA:  Palpable abnormality in the left axilla. Weight loss, anemia, weakness  EXAM: ULTRASOUND LEFT UPPER EXTREMITY LIMITED  TECHNIQUE: Ultrasound examination of the upper extremity soft tissues was performed in the area of clinical concern.  COMPARISON:  None.  FINDINGS: Targeted ultrasound was performed at site of patient's palpable abnormality within the left axilla. Corresponding to this site is a large heterogeneously hypoechoic solid soft tissue mass within the subcutaneous soft tissues measuring approximately 6.8 x 2.0 x 3.7 cm. Internal vascularity is seen throughout the mass. Along the deep margin of this mass are multiple small morphologically normal appearing axillary lymph nodes.  IMPRESSION: Large soft tissue mass within the left axilla measuring up to 6.8 cm with internal vascularity. Differential includes primary soft tissue neoplasm such as sarcoma versus a markedly enlarged axillary lymph node in the setting of lymphoma or other primary malignancy. Contrast enhanced CT of the chest is recommended for further evaluation.  These results will be called to the ordering clinician or representative by the Radiologist Assistant, and communication documented in the PACS or [employer].   Electronically Signed   By: Davina Poke D.O.   On: 03/24/2020 08:52

## 2020-03-24 NOTE — Telephone Encounter (Signed)
Please move MD appt to after CT.

## 2020-03-24 NOTE — Telephone Encounter (Signed)
Done. CT has been sched as requested Called and made pt aware of his sched CT @  [facility] CT [address] Npo 4hrs prior/liquids only and to arrive by 9:45am

## 2020-03-24 NOTE — Telephone Encounter (Signed)
Left message for patient to call for results.

## 2020-03-24 NOTE — Telephone Encounter (Signed)
Patient informed and would like to have CT scheduled.  Please schedule and inform him of appt detail.

## 2020-03-29 ENCOUNTER — Telehealth: Payer: Self-pay | Admitting: *Deleted

## 2020-03-29 NOTE — Telephone Encounter (Signed)
Wife called reporting that patient is having CT tomorrow and that she wants patient seen ASAP after CT results are back because he is losing weight and it has been a month of testing being done and he needs something to be done. His next follow up appointment is 04/05/20. Please advise

## 2020-03-29 NOTE — Telephone Encounter (Signed)
Justin Wallace has called again asking about moving [name] appointment up to this week after his CT scan. Please call her

## 2020-03-30 ENCOUNTER — Ambulatory Visit
Admission: RE | Admit: 2020-03-30 | Discharge: 2020-03-30 | Disposition: A | Discharge status: Home / Self Care | Payer: Medicare HMO | Source: Ambulatory Visit | Attending: Oncology | Admitting: Oncology

## 2020-03-30 ENCOUNTER — Other Ambulatory Visit: Payer: Self-pay

## 2020-03-30 DIAGNOSIS — R918 Other nonspecific abnormal finding of lung field: Secondary | ICD-10-CM | POA: Diagnosis not present

## 2020-03-30 DIAGNOSIS — R5383 Other fatigue: Secondary | ICD-10-CM | POA: Diagnosis not present

## 2020-03-30 DIAGNOSIS — I700 Atherosclerosis of aorta: Secondary | ICD-10-CM | POA: Diagnosis not present

## 2020-03-30 DIAGNOSIS — R2232 Localized swelling, mass and lump, left upper limb: Secondary | ICD-10-CM

## 2020-03-30 DIAGNOSIS — I2510 Atherosclerotic heart disease of native coronary artery without angina pectoris: Secondary | ICD-10-CM | POA: Insufficient documentation

## 2020-03-30 DIAGNOSIS — R634 Abnormal weight loss: Secondary | ICD-10-CM | POA: Diagnosis not present

## 2020-03-30 MED ORDER — IOHEXOL 300 MG/ML  SOLN
75.0000 mL | Freq: Once | INTRAMUSCULAR | Status: AC | PRN
  Administered 2020-03-30: 75 mL via INTRAVENOUS

## 2020-03-30 NOTE — Telephone Encounter (Signed)
Done. A detailed message was left on pts wife VM making her aware

## 2020-03-30 NOTE — Telephone Encounter (Signed)
Per Dr Tasia Catchings, move appointment to tomorrow

## 2020-03-30 NOTE — Telephone Encounter (Signed)
Wife has called this morning again asking for appointment to be moved up to this week ASAP after CT and she is requesting a call regarding appointment

## 2020-03-31 ENCOUNTER — Other Ambulatory Visit: Payer: Self-pay

## 2020-03-31 ENCOUNTER — Inpatient Hospital Stay: Payer: Medicare HMO | Attending: Oncology | Admitting: Oncology

## 2020-03-31 ENCOUNTER — Ambulatory Visit: Payer: Medicare HMO | Admitting: Oncology

## 2020-03-31 ENCOUNTER — Ambulatory Visit: Payer: Medicare HMO

## 2020-03-31 ENCOUNTER — Inpatient Hospital Stay: Payer: Medicare HMO

## 2020-03-31 ENCOUNTER — Encounter: Payer: Self-pay | Admitting: Oncology

## 2020-03-31 VITALS — BP 95/59 | HR 88 | Temp 96.7°F | Resp 16 | Wt 148.6 lb

## 2020-03-31 DIAGNOSIS — N6332 Unspecified lump in axillary tail of the left breast: Secondary | ICD-10-CM | POA: Diagnosis not present

## 2020-03-31 DIAGNOSIS — R591 Generalized enlarged lymph nodes: Secondary | ICD-10-CM | POA: Insufficient documentation

## 2020-03-31 DIAGNOSIS — C8514 Unspecified B-cell lymphoma, lymph nodes of axilla and upper limb: Secondary | ICD-10-CM | POA: Diagnosis not present

## 2020-03-31 DIAGNOSIS — A6920 Lyme disease, unspecified: Secondary | ICD-10-CM | POA: Insufficient documentation

## 2020-03-31 DIAGNOSIS — D649 Anemia, unspecified: Secondary | ICD-10-CM | POA: Diagnosis not present

## 2020-03-31 DIAGNOSIS — R2232 Localized swelling, mass and lump, left upper limb: Secondary | ICD-10-CM | POA: Insufficient documentation

## 2020-03-31 DIAGNOSIS — R519 Headache, unspecified: Secondary | ICD-10-CM | POA: Insufficient documentation

## 2020-03-31 DIAGNOSIS — R590 Localized enlarged lymph nodes: Secondary | ICD-10-CM | POA: Insufficient documentation

## 2020-03-31 DIAGNOSIS — R634 Abnormal weight loss: Secondary | ICD-10-CM | POA: Diagnosis not present

## 2020-03-31 LAB — SEDIMENTATION RATE: Sed Rate: 127 mm/hr — ABNORMAL HIGH (ref 0–20)

## 2020-03-31 NOTE — Progress Notes (Signed)
Hematology/Oncology follow up note [facility] Telephone:([phone] [phone]   Patient Care Team: Olin Hauser, DO as PCP - General (Family Medicine)  REFERRING PROVIDER: Nobie Putnam *  CHIEF COMPLAINTS/REASON FOR VISIT:  Follow up for weight loss. Fatigue, axillary mass  HISTORY OF PRESENTING ILLNESS:   Justin Wallace is a  74 y.o.  male with PMH listed below was seen in consultation at the request of  Nobie Putnam *  for evaluation of symptomatic anemia Patient reported history of sudden onset of diffuse body aches, decreased appetite, headache low-grade fever, profound weakness/fatigue about 2 weeks ago.  Prior to the onset of symptoms, he went to a funeral as well as applicable clinic.  He reports that he has received COVID-19 vaccination previously.  He moved from [place].  Daughter is an Therapist, sports and told him to get COVID-19 PCR checked and he was tested negative. Patient also reports a sudden drop of weight since the onset of symptoms.  Patient has a history of celiac disease.  No colonoscopy records or pathology records in current EMR.  Diagnosis was done by [facility] gastroenterology.  Patient reports that he was in his usual state of health until the onset of symptoms.  03/10/2020, patient was evaluated by primary care provider.  Blood work showed mild anemia with hemoglobin of 13.1, Iron panel showed decreased saturation of 8, ferritin 218, TIBC 291, patient was referred to hematology for evaluation of symptomatic anemia and iron deficiency.  Patient reports that her body aches, fever and headache symptoms have improved.  However he continues to feel very tired and fatigued.  Appetite has decreased and weight remains low.  INTERVAL HISTORY Justin Wallace is a 74 y.o. male who has above history reviewed by me today presents for follow up visit for weight loss, axillary mass, fatigue Problems and complaints are listed  below: Patient reports continues to have weight loss lack of appetite.  Physical examination during last examination showed left axillary mass and he has had ultrasound followed by CT chest.  He presents for further discussion. Today he weighs 148 pounds which is 5 pounds loss since last visit.   Review of Systems  Constitutional: Positive for fatigue and unexpected weight change. Negative for appetite change, chills and fever.  HENT:   Negative for hearing loss and voice change.   Eyes: Negative for eye problems and icterus.  Respiratory: Negative for chest tightness, cough and shortness of breath.   Cardiovascular: Negative for chest pain and leg swelling.  Gastrointestinal: Negative for abdominal distention and abdominal pain.  Endocrine: Negative for hot flashes.  Genitourinary: Negative for difficulty urinating, dysuria and frequency.   Musculoskeletal: Negative for arthralgias.  Skin: Negative for itching and rash.  Neurological: Negative for light-headedness and numbness.  Hematological: Negative for adenopathy. Does not bruise/bleed easily.  Psychiatric/Behavioral: Negative for confusion.    MEDICAL HISTORY:  Past Medical History:  Diagnosis Date  . Celiac disease     SURGICAL HISTORY: History reviewed. No pertinent surgical history.  SOCIAL HISTORY: Social History   Socioeconomic History  . Marital status: Married    Spouse name: Not on file  . Number of children: Not on file  . Years of education: Not on file  . Highest education level: Not on file  Occupational History  . Not on file  Tobacco Use  . Smoking status: Never Smoker  . Smokeless tobacco: Never Used  Vaping Use  . Vaping Use: Never used  Substance and Sexual Activity  .  Alcohol use: Not Currently  . Drug use: Never  . Sexual activity: Never  Other Topics Concern  . Not on file  Social History Narrative  . Not on file   Social Determinants of Health   Financial Resource Strain:   .  Difficulty of Paying Living Expenses: Not on file  Food Insecurity:   . Worried About Charity fundraiser in the Last Year: Not on file  . Ran Out of Food in the Last Year: Not on file  Transportation Needs:   . Lack of Transportation (Medical): Not on file  . Lack of Transportation (Non-Medical): Not on file  Physical Activity:   . Days of Exercise per Week: Not on file  . Minutes of Exercise per Session: Not on file  Stress:   . Feeling of Stress : Not on file  Social Connections:   . Frequency of Communication with Friends and Family: Not on file  . Frequency of Social Gatherings with Friends and Family: Not on file  . Attends Religious Services: Not on file  . Active Member of Clubs or Organizations: Not on file  . Attends Archivist Meetings: Not on file  . Marital Status: Not on file  Intimate Partner Violence:   . Fear of Current or Ex-Partner: Not on file  . Emotionally Abused: Not on file  . Physically Abused: Not on file  . Sexually Abused: Not on file    FAMILY HISTORY: Family History  Problem Relation Age of Onset  . Hyperlipidemia Mother   . Brain cancer Father     ALLERGIES:  has No Known Allergies.  MEDICATIONS:  Current Outpatient Medications  Medication Sig Dispense Refill  . ferrous sulfate 325 (65 FE) MG EC tablet Take 325 mg by mouth daily with breakfast.     . Multiple Vitamin (THERA) TABS Take 1 tablet by mouth daily.    . tadalafil (CIALIS) 20 MG tablet May take 1/2 to 1 tablet as needed.     No current facility-administered medications for this visit.     PHYSICAL EXAMINATION: ECOG PERFORMANCE STATUS: 1 - Symptomatic but completely ambulatory Vitals:   03/31/20 0854  BP: (!) 95/59  Pulse: 88  Resp: 16  Temp: (!) 96.7 F (35.9 C)   Filed Weights   03/31/20 0854  Weight: 148 lb 9.6 oz (67.4 kg)    Physical Exam Constitutional:      General: He is not in acute distress. HENT:     Head: Normocephalic and atraumatic.   Eyes:     General: No scleral icterus. Cardiovascular:     Rate and Rhythm: Normal rate and regular rhythm.     Heart sounds: Normal heart sounds.  Pulmonary:     Effort: Pulmonary effort is normal. No respiratory distress.     Breath sounds: No wheezing.  Abdominal:     General: Bowel sounds are normal. There is no distension.     Palpations: Abdomen is soft.  Musculoskeletal:        General: No deformity. Normal range of motion.     Cervical back: Normal range of motion and neck supple.     Comments: Left axillary enlarged lymph node.   Skin:    General: Skin is warm and dry.     Findings: No erythema or rash.  Neurological:     Mental Status: He is alert and oriented to person, place, and time. Mental status is at baseline.     Cranial Nerves: No cranial  nerve deficit.     Coordination: Coordination normal.  Psychiatric:        Mood and Affect: Mood normal.     LABORATORY DATA:  I have reviewed the data as listed Lab Results  Component Value Date   WBC 6.8 03/17/2020   HGB 13.0 03/17/2020   HCT 38.2 (L) 03/17/2020   MCV 85.3 03/17/2020   PLT 337 03/17/2020   Recent Labs    03/10/20 1113 03/17/20 1029  NA 138 137  K 4.8 3.8  CL 106 104  CO2 27 27  GLUCOSE 94 97  BUN 23 21  CREATININE 0.91 1.09  CALCIUM 8.4* 8.1*  GFRNONAA 84 >60  GFRAA 98 >60  PROT 6.8 8.0  ALBUMIN  --  2.7*  AST 20 24  ALT 13 18  ALKPHOS  --  140*  BILITOT 0.4 0.3   Iron/TIBC/Ferritin/ %Sat    Component Value Date/Time   IRON 22 (L) 03/17/2020 1029   IRON 17 02/17/2018 0000   TIBC 286 03/17/2020 1029   TIBC 425 02/17/2018 0000   FERRITIN 284 03/17/2020 1029   IRONPCTSAT 8 (L) 03/17/2020 1029   IRONPCTSAT 8 (L) 03/10/2020 1113      RADIOGRAPHIC STUDIES: I have personally reviewed the radiological images as listed and agreed with the findings in the report. CT CHEST W CONTRAST  Result Date: 03/30/2020 CLINICAL DATA:  Left axillary mass detected by ultrasound, fatigue,  weight loss EXAM: CT CHEST WITH CONTRAST TECHNIQUE: Multidetector CT imaging of the chest was performed during intravenous contrast administration. CONTRAST:  56m OMNIPAQUE IOHEXOL 300 MG/ML  SOLN COMPARISON:  None. FINDINGS: Cardiovascular: Aortic atherosclerosis. Normal heart size. Left coronary artery calcifications. No pericardial effusion. Mediastinum/Nodes: There is a soft tissue mass or grossly enlarged lymph node in the left axilla measuring 6.3 x 4.6 x 3.0 cm (series 2, image 47, series 6, image 155). There are additional prominent, although much smaller right axillary lymph nodes, pretracheal, and superior mediastinal lymph nodes, measuring up to 1.0 x 0.8 cm in the superior mediastinum (series 2, image 42). No other enlarged mediastinal, hilar, or axillary lymph nodes. Thyroid gland, trachea, and esophagus demonstrate no significant findings. Lungs/Pleura: Mild dependent bibasilar scarring and or atelectasis. No pleural effusion or pneumothorax. Upper Abdomen: No acute abnormality. Musculoskeletal: No chest wall mass or suspicious bone lesions identified. IMPRESSION: 1. There is a soft tissue mass or grossly enlarged lymph node in the left axilla measuring 6.3 cm, concerning for primary mass or metastatic lymphadenopathy. 2. There are additional prominent, although much smaller right axillary lymph nodes, pretracheal, and superior mediastinal lymph nodes. Findings are concerning for malignancy such as lymphoma or nodal metastatic disease. Whole-body PET-CT may be helpful to assess for abnormal metabolic activity and additional abnormal lymph nodes or other evidence of metastatic disease in the abdomen and pelvis. 3. No evidence of pulmonary metastatic disease. 4. Coronary artery disease.  Aortic Atherosclerosis (ICD10-I70.0). Electronically Signed   By: AEddie CandleM.D.   On: 03/30/2020 11:46   [place] UPPER EXTREM LTD SOFT TISSUE NON VASCULAR  Result Date: 03/24/2020 CLINICAL DATA:  Palpable  abnormality in the left axilla. Weight loss, anemia, weakness EXAM: ULTRASOUND LEFT UPPER EXTREMITY LIMITED TECHNIQUE: Ultrasound examination of the upper extremity soft tissues was performed in the area of clinical concern. COMPARISON:  None. FINDINGS: Targeted ultrasound was performed at site of patient's palpable abnormality within the left axilla. Corresponding to this site is a large heterogeneously hypoechoic solid soft tissue mass within the subcutaneous  soft tissues measuring approximately 6.8 x 2.0 x 3.7 cm. Internal vascularity is seen throughout the mass. Along the deep margin of this mass are multiple small morphologically normal appearing axillary lymph nodes. IMPRESSION: Large soft tissue mass within the left axilla measuring up to 6.8 cm with internal vascularity. Differential includes primary soft tissue neoplasm such as sarcoma versus a markedly enlarged axillary lymph node in the setting of lymphoma or other primary malignancy. Contrast enhanced CT of the chest is recommended for further evaluation. These results will be called to the ordering clinician or representative by the Radiologist Assistant, and communication documented in the PACS or [employer]. Electronically Signed   By: Davina Poke D.O.   On: 03/24/2020 08:52      ASSESSMENT & PLAN:  1. Lymphadenopathy   2. Axillary mass, left   3. Weight loss    #Left axillary mass, ultrasound confirmed a left axillary large soft tissue mass up to 6.8 cm with internal vascularity. CT chest with contrast showed soft tissue mass/enlarged lymph node in the left axilla 6.3 cm, concerning for primary mass or metastatic lymphadenopathy.  There are additional prominent although much smaller right axillary lymph nodes, pretracheal and superior mediastinal lymph nodes.  Findings are concerning for malignancy such as lymphoma or nodal metastatic disease. Image were independently viewed by me and discussed with patient and wife.   Differential diagnosis includes lymphoma, nodal metastasis from solid tumor, soft tissue neoplasm.  Onset of symptoms was abrupt.  Check infectious etiology as well.  Check flow cytometry, ESR, Lyme's disease and cat scrach disease work-up.  Recommend to obtain ultrasound-guided left axillary mass biopsy.  Obtain PET scan whole body for further evaluation. Further management plan pending work-up. # celiac disease,  to establish care with local GI physician.   Orders Placed This Encounter  Procedures  . NM PET Image Initial (PI) Skull Base To Thigh    Standing Status:   Future    Standing Expiration Date:   03/31/2021    Order Specific Question:   If indicated for the ordered procedure, I authorize the administration of a radiopharmaceutical per Radiology protocol    Answer:   Yes    Order Specific Question:   Preferred imaging location?    Answer:   [facility]    Order Specific Question:   Radiology Contrast Protocol - do NOT remove file path    Answer:   [email or web address]  . [place] AXILLARY NODE CORE BIOPSY LEFT    Standing Status:   Future    Standing Expiration Date:   03/31/2021    Order Specific Question:   Reason for Exam (SYMPTOM  OR DIAGNOSIS REQUIRED)    Answer:   biopsy of left axillary mass    Order Specific Question:   Preferred location?    Answer:   [facility]  . Flow cytometry panel-leukemia/lymphoma work-up    Standing Status:   Future    Standing Expiration Date:   03/31/2021  . Sedimentation rate    Standing Status:   Future    Number of Occurrences:   1    Standing Expiration Date:   03/31/2021  . B. burgdorfi antibodies (Lyme, total Ab test/reflex)    Standing Status:   Future    Number of Occurrences:   1    Standing Expiration Date:   03/31/2021  . Bartonella Antibody Panel    Standing Status:   Future    Number of Occurrences:   1    Standing  Expiration Date:   03/31/2021  . Multiple Myeloma Panel (SPEP&IFE w/QIG)     Standing Status:   Future    Number of Occurrences:   1    Standing Expiration Date:   03/31/2021  . Kappa/lambda light chains    Standing Status:   Future    Number of Occurrences:   1    Standing Expiration Date:   03/31/2021    All questions were answered. The patient knows to call the clinic with any problems questions or concerns.  cc Nobie Putnam *    Return of visit: To be determined Thank you for this kind referral and the opportunity to participate in the care of this patient. A copy of today's note is routed to referring provider    Earlie Server, MD, PhD Hematology Oncology [facility] Pager- [phone] 03/31/2020

## 2020-03-31 NOTE — Progress Notes (Signed)
Patient here to discuss results of CT yesterday.  He feels that his energy is slowly improving.

## 2020-04-01 ENCOUNTER — Ambulatory Visit: Payer: Medicare HMO

## 2020-04-01 ENCOUNTER — Encounter: Payer: Self-pay | Admitting: Oncology

## 2020-04-01 ENCOUNTER — Telehealth: Payer: Self-pay | Admitting: *Deleted

## 2020-04-01 LAB — KAPPA/LAMBDA LIGHT CHAINS
Kappa free light chain: 183.3 mg/L — ABNORMAL HIGH (ref 3.3–19.4)
Kappa, lambda light chain ratio: 0.83 (ref 0.26–1.65)
Lambda free light chains: 221.2 mg/L — ABNORMAL HIGH (ref 5.7–26.3)

## 2020-04-01 LAB — BARTONELLA ANTIBODY PANEL
B Quintana IgM: NEGATIVE titer
B henselae IgG: NEGATIVE titer
B henselae IgM: NEGATIVE titer
B quintana IgG: NEGATIVE titer

## 2020-04-01 NOTE — Telephone Encounter (Signed)
Per Margreta Journey 04/01/20 staff message- cx 04/06/20 PET scan due to pending auth.  A detailed message was left on pts VM making him aware of the 04/06/20 sched PET being cx and the reasoning. Pt was asked to contact the office letting us know that the message was received. Pt called back/Message received.

## 2020-04-02 LAB — MULTIPLE MYELOMA PANEL, SERUM
Albumin SerPl Elph-Mcnc: 2.7 g/dL — ABNORMAL LOW (ref 2.9–4.4)
Albumin/Glob SerPl: 0.7 (ref 0.7–1.7)
Alpha 1: 0.2 g/dL (ref 0.0–0.4)
Alpha2 Glob SerPl Elph-Mcnc: 0.8 g/dL (ref 0.4–1.0)
B-Globulin SerPl Elph-Mcnc: 1 g/dL (ref 0.7–1.3)
Gamma Glob SerPl Elph-Mcnc: 2.4 g/dL — ABNORMAL HIGH (ref 0.4–1.8)
Globulin, Total: 4.4 g/dL — ABNORMAL HIGH (ref 2.2–3.9)
IgA: 644 mg/dL — ABNORMAL HIGH (ref 61–437)
IgG (Immunoglobin G), Serum: 2273 mg/dL — ABNORMAL HIGH (ref 603–1613)
IgM (Immunoglobulin M), Srm: 212 mg/dL — ABNORMAL HIGH (ref 15–143)
Total Protein ELP: 7.1 g/dL (ref 6.0–8.5)

## 2020-04-05 ENCOUNTER — Inpatient Hospital Stay: Payer: Medicare HMO | Admitting: Oncology

## 2020-04-05 ENCOUNTER — Telehealth: Payer: Self-pay | Admitting: Oncology

## 2020-04-05 ENCOUNTER — Inpatient Hospital Stay: Payer: Medicare HMO

## 2020-04-05 ENCOUNTER — Other Ambulatory Visit: Payer: Self-pay

## 2020-04-05 DIAGNOSIS — N6332 Unspecified lump in axillary tail of the left breast: Secondary | ICD-10-CM | POA: Diagnosis not present

## 2020-04-05 DIAGNOSIS — R519 Headache, unspecified: Secondary | ICD-10-CM | POA: Diagnosis not present

## 2020-04-05 DIAGNOSIS — A6920 Lyme disease, unspecified: Secondary | ICD-10-CM | POA: Diagnosis not present

## 2020-04-05 DIAGNOSIS — R590 Localized enlarged lymph nodes: Secondary | ICD-10-CM | POA: Diagnosis not present

## 2020-04-05 DIAGNOSIS — R591 Generalized enlarged lymph nodes: Secondary | ICD-10-CM

## 2020-04-05 DIAGNOSIS — D649 Anemia, unspecified: Secondary | ICD-10-CM | POA: Diagnosis not present

## 2020-04-05 DIAGNOSIS — C8514 Unspecified B-cell lymphoma, lymph nodes of axilla and upper limb: Secondary | ICD-10-CM | POA: Diagnosis not present

## 2020-04-05 DIAGNOSIS — R634 Abnormal weight loss: Secondary | ICD-10-CM | POA: Diagnosis not present

## 2020-04-05 LAB — LYME, WESTERN BLOT, SERUM (REFLEXED)
IgG P28 Ab.: ABSENT
IgG P30 Ab.: ABSENT
IgG P45 Ab.: ABSENT
IgG P58 Ab.: ABSENT
IgG P66 Ab.: ABSENT
IgG P93 Ab.: ABSENT
IgM P23 Ab.: ABSENT
Lyme IgG Wb: NEGATIVE
Lyme IgM Wb: POSITIVE — AB

## 2020-04-05 LAB — B. BURGDORFI ANTIBODIES: B burgdorferi Ab IgG+IgM: 2.48 {ISR} — ABNORMAL HIGH (ref 0.00–0.90)

## 2020-04-05 NOTE — Telephone Encounter (Signed)
Message received from Margreta Journey that the PET is approved Auth# [id number] valid 04/05/20-05/05/20. Please schedule and let patient know appt details.  Thanks!

## 2020-04-05 NOTE — Telephone Encounter (Signed)
Pt had question regarding a "missed appt on 9/2". This was a symptom management encounter for possible IVF that he did not need that day. Explained to him this and he voiced understanding.

## 2020-04-05 NOTE — Telephone Encounter (Signed)
Pt would like a call to discuss his canceled appts. Informed pt that a team member had been notified and he would receive a call later.

## 2020-04-06 ENCOUNTER — Other Ambulatory Visit: Payer: Self-pay | Admitting: Internal Medicine

## 2020-04-06 ENCOUNTER — Other Ambulatory Visit: Payer: Self-pay

## 2020-04-06 ENCOUNTER — Telehealth: Payer: Self-pay | Admitting: Family Medicine

## 2020-04-06 ENCOUNTER — Ambulatory Visit: Payer: Medicare HMO

## 2020-04-06 DIAGNOSIS — R2232 Localized swelling, mass and lump, left upper limb: Secondary | ICD-10-CM

## 2020-04-06 DIAGNOSIS — R591 Generalized enlarged lymph nodes: Secondary | ICD-10-CM

## 2020-04-06 DIAGNOSIS — A6920 Lyme disease, unspecified: Secondary | ICD-10-CM

## 2020-04-06 MED ORDER — DOXYCYCLINE HYCLATE 100 MG PO TABS: 100.0000 mg | ORAL_TABLET | Freq: Two times a day (BID) | ORAL | 0 refills | Status: DC

## 2020-04-06 NOTE — Telephone Encounter (Signed)
Details of new appt given to patient's wife, per [place].

## 2020-04-06 NOTE — Telephone Encounter (Signed)
Called patient. Reviewed notes below. He agrees to start Doxycycline.  Start taking Doxycycline antibiotic 166m twice daily for 21 days - Take with full glass of water and stay upright for at least 30 min after taking, may be seated or standing, but should NOT lay down. This is just a safety precaution, if this medicine does not go all the way down throat well it could cause some burning discomfort to throat and esophagus.  Instructions given.  Sent rx to [facility] pills for 3 weeks.  See him next week. If not improving we can refer to Dr RDelaine LameID specialist.  ANobie Putnam [facility] 04/06/2020, 6:17 PM

## 2020-04-06 NOTE — Telephone Encounter (Signed)
Appointment scheduled for 04/12/2020 around 2:20 pm as office visit.

## 2020-04-06 NOTE — Telephone Encounter (Signed)
Notified by Dr Tasia Catchings [facility] Heme/Onc regarding recent infectious work up done for Left Axillary LAD/mass, identified on exam and CT imaging - he will have PET scan done as well.  She is doing further work up and he has symptomatic anemia.  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  I advised that I would like to speak to patient more and follow-up on this topic.  I will also reach out to Dr Delaine Lame Larence Penning ID specialist to get input to see if this is something she would recommend a consultation or if we should proceed with treatment first through PCP now.  Please notify patient that I would like to see him in follow-up within about 1 week - we can do in person or virtual telephone or mychart video - I would prefer next week if possible to give me time to speak with Infection Disease specialist.  Nobie Putnam, DO [facility] 04/06/2020, 12:55 PM

## 2020-04-07 ENCOUNTER — Telehealth: Payer: Self-pay

## 2020-04-07 NOTE — Telephone Encounter (Signed)
Done.. Pt has been scheduled to RTC on 04/21/20 @ 9:30

## 2020-04-07 NOTE — Telephone Encounter (Signed)
Justin Wallace scheduled for Biopsy on Wed 04/13/20 @ 1:30p, needs to arrive at 12:30p. Please schedule patient for MD only 1 week after biopsy. I will contact pt with appt details.

## 2020-04-07 NOTE — Telephone Encounter (Signed)
Pt has been informed of appts and repeated appt details.

## 2020-04-08 LAB — COMP PANEL: LEUKEMIA/LYMPHOMA

## 2020-04-11 ENCOUNTER — Other Ambulatory Visit: Payer: Self-pay | Admitting: Student

## 2020-04-11 NOTE — Progress Notes (Signed)
Patient on schedule for LN biopsy 04/13/2020, spoke with patient on phone. Made aware to be here @ 1230, NPO after 0600 in case sedation necessary, and driver for discharge post recovery.

## 2020-04-12 ENCOUNTER — Ambulatory Visit (INDEPENDENT_AMBULATORY_CARE_PROVIDER_SITE_OTHER): Payer: Medicare HMO | Admitting: Family Medicine

## 2020-04-12 ENCOUNTER — Other Ambulatory Visit: Payer: Self-pay

## 2020-04-12 ENCOUNTER — Encounter: Payer: Self-pay | Admitting: Family Medicine

## 2020-04-12 ENCOUNTER — Ambulatory Visit
Admission: RE | Admit: 2020-04-12 | Discharge: 2020-04-12 | Disposition: A | Discharge status: Home / Self Care | Payer: Medicare HMO | Source: Ambulatory Visit | Attending: Oncology | Admitting: Oncology

## 2020-04-12 VITALS — BP 80/44 | HR 82 | Temp 97.3°F | Resp 16 | Ht 69.0 in | Wt 149.0 lb

## 2020-04-12 DIAGNOSIS — R2232 Localized swelling, mass and lump, left upper limb: Secondary | ICD-10-CM | POA: Diagnosis not present

## 2020-04-12 DIAGNOSIS — R591 Generalized enlarged lymph nodes: Secondary | ICD-10-CM

## 2020-04-12 DIAGNOSIS — A6920 Lyme disease, unspecified: Secondary | ICD-10-CM | POA: Diagnosis not present

## 2020-04-12 DIAGNOSIS — C9030 Solitary plasmacytoma not having achieved remission: Secondary | ICD-10-CM | POA: Diagnosis not present

## 2020-04-12 LAB — GLUCOSE, CAPILLARY: Glucose-Capillary: 85 mg/dL (ref 70–99)

## 2020-04-12 MED ORDER — FLUDEOXYGLUCOSE F - 18 (FDG) INJECTION
7.8800 | Freq: Once | INTRAVENOUS | Status: AC | PRN
  Administered 2020-04-12: 7.88 via INTRAVENOUS

## 2020-04-12 NOTE — Patient Instructions (Addendum)
Thank you for coming to the office today.  Keep in touch with [place] and we will follow your results.  For iron, low reserves, but current appropriate hemoglobin, keep on oral OTC iron supplement 367m daily WITH FOOD (Fe 652m  May start Multivitamin with variety - double check if it has iron.  Recommend Vitamin D3 up to 2,000 unit daily  Recommend Folic Acid up to 10761PJaily  Recommend Vitamin B12 up to 100017mdaily  Stay tuned for results  Keep a watch on BP, if low blood pressure continues, weight loss, dizziness, lightheaded, may need further management.   Please schedule a Follow-up Appointment to: Return if symptoms worsen or fail to improve, for return as scheduled November 2021.  If you have any other questions or concerns, please feel free to call the office or send a message through [facility] may also schedule an earlier appointment if necessary.  Additionally, you may be receiving a survey about your experience at our office within a few days to 1 week by e-mail or mail. We value your feedback.  AleNobie PutnamO [facility]

## 2020-04-12 NOTE — Progress Notes (Signed)
Subjective:    Patient ID: Justin Wallace, male    DOB: [birth year], 74 y.o.   MRN: [id number]  Justin Wallace is a 74 y.o. male presenting on 04/12/2020 for Follow-up ( after Infection Disease specialist) and Lyme Disease   HPI   Suspected Lyme Disease Left Axillary Lymphadenopathy, worsening Reports initial size of lymph node was about size of half pencil eraser, now increased to approximately size of 50 cent piece PET Scan this AM, awaiting result. Tomorrow is the scheduled Ultrasound and Biopsy of this lymph node Diagnosed on labs with Lyme Disease based on antibody, case was discussed with [facility] ID Dr Delaine Lame and patient was started on doxycycline last week 04/06/20 after he was contacted by me and discussed potential diagnosis. Reports gradual improvement in fatigue/tired each day, he is on 21 day course doxycycline, still taking, tolerating well Onset about 6 weeks ago, now about 50% improved overall Admits some episodic pain associated with lymph node under arm Lowest weight down to 142 lbs, seems to avg up to 144-145, without clothes, and our scales have read around 148-149 lbs. Recently, overall weight loss approximately 20 lbs from onset.  Denies focal joint pain or swelling, chest pain or dyspnea, near syncope, dizziness, lightheadedness, numbness tingling  Depression screen [facility] 2/9 03/10/2020  Decreased Interest 0  Down, Depressed, Hopeless 0  PHQ - 2 Score 0    Social History   Tobacco Use  . Smoking status: Never Smoker  . Smokeless tobacco: Never Used  Vaping Use  . Vaping Use: Never used  Substance Use Topics  . Alcohol use: Not Currently  . Drug use: Never    Review of Systems Per HPI unless specifically indicated above     Objective:    BP (!) 80/44   Pulse 82   Temp (!) 97.3 F (36.3 C) (Temporal)   Resp 16   Ht 5' 9"  (1.753 m)   Wt 149 lb (67.6 kg)   SpO2 99%   BMI 22.00 kg/m   Wt Readings from Last 3 Encounters:  04/12/20 149 lb (67.6 kg)    03/31/20 148 lb 9.6 oz (67.4 kg)  03/17/20 153 lb 9.6 oz (69.7 kg)    Physical Exam Vitals and nursing note reviewed.  Constitutional:      General: He is not in acute distress.    Appearance: He is well-developed. He is not diaphoretic.     Comments: Well-appearing, comfortable, cooperative  HENT:     Head: Normocephalic and atraumatic.  Eyes:     General:        Right eye: No discharge.        Left eye: No discharge.     Conjunctiva/sclera: Conjunctivae normal.  Neck:     Thyroid: No thyromegaly.  Cardiovascular:     Rate and Rhythm: Normal rate and regular rhythm.     Heart sounds: Normal heart sounds. No murmur heard.   Pulmonary:     Effort: Pulmonary effort is normal. No respiratory distress.     Breath sounds: Normal breath sounds. No wheezing or rales.  Musculoskeletal:        General: Normal range of motion.     Cervical back: Normal range of motion and neck supple.  Lymphadenopathy:     Head:     Right side of head: No submental, submandibular, tonsillar, preauricular, posterior auricular or occipital adenopathy.     Left side of head: No submental, submandibular, tonsillar, preauricular, posterior auricular or occipital adenopathy.  Cervical: No cervical adenopathy.     Right cervical: No superficial or posterior cervical adenopathy.    Left cervical: No superficial or posterior cervical adenopathy.     Upper Body:     Right upper body: No supraclavicular or axillary adenopathy.     Left upper body: Axillary adenopathy present. No supraclavicular adenopathy.  Skin:    General: Skin is warm and dry.     Findings: No erythema or rash.  Neurological:     Mental Status: He is alert and oriented to person, place, and time.  Psychiatric:        Behavior: Behavior normal.     Comments: Well groomed, good eye contact, normal speech and thoughts      NM PET Image Initial (PI) Whole Body [[id number]] Resulted: 04/12/20 1440  Order Status: Completed Updated:  04/12/20 1442  Narrative:   CLINICAL DATA: Initial treatment strategy for LEFT axillary mass.   EXAM:  NUCLEAR MEDICINE PET WHOLE BODY   TECHNIQUE:  7.8 mCi F-18 FDG was injected intravenously. Full-ring PET imaging  was performed from the head to foot after the radiotracer. CT data  was obtained and used for attenuation correction and anatomic  localization.   Fasting blood glucose: 85 mg/dl   COMPARISON: None.   FINDINGS:  Mediastinal blood pool activity: SUV max 2.3   HEAD/NECK: No hypermetabolic activity in the scalp. No  hypermetabolic cervical lymph nodes.   Incidental CT findings: none   CHEST: Large LEFT axillary mass measures 2.7 by 5.0 cm short axis  with intense metabolic activity (SUV max equal 28)   Several smaller LEFT axillary nodes more superiorly are not  pathologic by size criteria and have minimal metabolic activity (SUV  max 2.0)   Similar small RIGHT axillary lymph nodes are normal morphology with  low metabolic activity (SUV max equal 2.7 (image 472.)   Incidental CT findings: No suspicious pulmonary nodules.   ABDOMEN/PELVIS: There is moderate activity in the porta hepatis  region with SUV max equal 4.7. No discrete enlarged nodes are  present. No abnormal activity liver.   The spleen is normal size and normal metabolic activity. No  periaortic or iliac adenopathy. Pelvic adenopathy or inguinal  adenopathy.   Incidental CT findings: none   SKELETON: No focal hypermetabolic activity to suggest skeletal  metastasis.   Incidental CT findings: No lytic lesion.   EXTREMITIES: No abnormal hypermetabolic activity in the lower  extremities.   Incidental CT findings: No lytic or sclerotic lesion   IMPRESSION:  1. Large intensely hypermetabolic LEFT axillary mass with  differential including solitary massive lymph node versus soft  tissue plasmacytoma.  2. Additional small bilateral axial lymph nodes with mild metabolic  activity. Favor  reactive nodes.  3. Metabolic activity through the porta hepatis region without  enlarged nodes identified. Favor reactive activity or benign GI  activity.  4. Normal spleen and bone marrow.  5. No lytic or hypermetabolic skeletal lesion.    Electronically Signed  By: Suzy Bouchard M.D.  On: 04/12/2020 14:40       Results for orders placed or performed during the hospital encounter of 04/12/20  Glucose, capillary  Result Value Ref Range   Glucose-Capillary 85 70 - 99 mg/dL      Assessment & Plan:   Problem List Items Addressed This Visit    Lymphadenopathy    Other Visit Diagnoses    Lyme disease    -  Primary      #Lyme Positive lyme  b burgdorferi titers and confirm western blot Discussed case with Dr Sidney Ace ID on Epic Chat Continue doxycycline course up to 21 days, 1st dose 04/06/20, last dose 04/27/20. Clinical evaluation of possible lyme complications today, no history or exam to support, discussed may monitor in future if ultimately lyme was issue can investigate with ECHO for lyme carditis or other complications if indicated. May refer to ID if not improved  #Lymphadenopathy, L Axillary Followed by Heme/Onc with current work up underway PET scan earlier today Upcoming US / Biopsy tomorrow of mass Discussed goal for tissue diagnosis and other testing to evaluate cause of mass and etiology    No orders of the defined types were placed in this encounter.     Follow up plan: Return if symptoms worsen or fail to improve, for return as scheduled November 2021.   Nobie Putnam, [facility] 04/12/2020, 2:49 PM

## 2020-04-13 ENCOUNTER — Ambulatory Visit
Admission: RE | Admit: 2020-04-13 | Discharge: 2020-04-13 | Disposition: A | Discharge status: Home / Self Care | Payer: Medicare HMO | Source: Ambulatory Visit | Attending: Oncology | Admitting: Oncology

## 2020-04-13 DIAGNOSIS — R591 Generalized enlarged lymph nodes: Secondary | ICD-10-CM

## 2020-04-13 DIAGNOSIS — R2232 Localized swelling, mass and lump, left upper limb: Secondary | ICD-10-CM | POA: Diagnosis not present

## 2020-04-13 DIAGNOSIS — R590 Localized enlarged lymph nodes: Secondary | ICD-10-CM | POA: Insufficient documentation

## 2020-04-13 DIAGNOSIS — R599 Enlarged lymph nodes, unspecified: Secondary | ICD-10-CM | POA: Diagnosis not present

## 2020-04-13 DIAGNOSIS — C8514 Unspecified B-cell lymphoma, lymph nodes of axilla and upper limb: Secondary | ICD-10-CM | POA: Diagnosis not present

## 2020-04-13 DIAGNOSIS — C8519 Unspecified B-cell lymphoma, extranodal and solid organ sites: Secondary | ICD-10-CM | POA: Diagnosis not present

## 2020-04-13 MED ORDER — SODIUM CHLORIDE 0.9 % IV SOLN: INTRAVENOUS | Status: DC

## 2020-04-13 NOTE — Discharge Instructions (Signed)
Needle Biopsy, Care After These instructions tell you how to care for yourself after your procedure. Your doctor may also give you more specific instructions. Call your doctor if you have any problems or questions. What can I expect after the procedure? After the procedure, it is common to have:  Soreness.  Bruising.  Mild pain. Follow these instructions at home:   Return to your normal activities as told by your doctor. Ask your doctor what activities are safe for you.  Take over-the-counter and prescription medicines only as told by your doctor.  Wash your hands with soap and water before you change your bandage (dressing). If you cannot use soap and water, use hand sanitizer.  Follow instructions from your doctor about: ? How to take care of your puncture site. ? When and how to change your bandage. ? When to remove your bandage.  Check your puncture site every day for signs of infection. Watch for: ? Redness, swelling, or pain. ? Fluid or blood. ? Pus or a bad smell. ? Warmth.  Do not take baths, swim, or use a hot tub until your doctor approves. Ask your doctor if you may take showers. You may only be allowed to take sponge baths.  Keep all follow-up visits as told by your doctor. This is important. Contact a doctor if you have:  A fever.  Redness, swelling, or pain at the puncture site, and it lasts longer than a few days.  Fluid, blood, or pus coming from the puncture site.  Warmth coming from the puncture site. Get help right away if:  You have a lot of bleeding from the puncture site. Summary  After the procedure, it is common to have soreness, bruising, or mild pain at the puncture site.  Check your puncture site every day for signs of infection, such as redness, swelling, or pain.  Get help right away if you have severe bleeding from your puncture site. This information is not intended to replace advice given to you by your health care provider. Make  sure you discuss any questions you have with your health care provider. Document Revised: 07/29/2017 Document Reviewed: 07/29/2017 Elsevier Patient Education  2020 [employer].

## 2020-04-13 NOTE — Procedures (Signed)
Pre Procedure Dx: Left axillary Lymphadenopathy Post Procedural Dx: Same  Technically successful US guided biopsy of hypermetabolic left axillary lymph node.  EBL: None  No immediate complications.   Ronny Bacon, MD Pager #: [phone]

## 2020-04-14 DIAGNOSIS — R599 Enlarged lymph nodes, unspecified: Secondary | ICD-10-CM | POA: Diagnosis not present

## 2020-04-19 ENCOUNTER — Encounter: Payer: Self-pay | Admitting: Oncology

## 2020-04-21 ENCOUNTER — Other Ambulatory Visit: Payer: Self-pay

## 2020-04-21 ENCOUNTER — Inpatient Hospital Stay: Payer: Medicare HMO | Admitting: Oncology

## 2020-04-21 ENCOUNTER — Telehealth (INDEPENDENT_AMBULATORY_CARE_PROVIDER_SITE_OTHER): Payer: Self-pay

## 2020-04-21 ENCOUNTER — Encounter: Payer: Self-pay | Admitting: Oncology

## 2020-04-21 VITALS — BP 102/57 | HR 71 | Temp 96.7°F | Resp 16 | Wt 150.9 lb

## 2020-04-21 DIAGNOSIS — C8510 Unspecified B-cell lymphoma, unspecified site: Secondary | ICD-10-CM | POA: Diagnosis not present

## 2020-04-21 DIAGNOSIS — C8514 Unspecified B-cell lymphoma, lymph nodes of axilla and upper limb: Secondary | ICD-10-CM | POA: Diagnosis not present

## 2020-04-21 DIAGNOSIS — N6332 Unspecified lump in axillary tail of the left breast: Secondary | ICD-10-CM | POA: Diagnosis not present

## 2020-04-21 DIAGNOSIS — Z7189 Other specified counseling: Secondary | ICD-10-CM | POA: Diagnosis not present

## 2020-04-21 DIAGNOSIS — R634 Abnormal weight loss: Secondary | ICD-10-CM | POA: Diagnosis not present

## 2020-04-21 DIAGNOSIS — D649 Anemia, unspecified: Secondary | ICD-10-CM | POA: Diagnosis not present

## 2020-04-21 DIAGNOSIS — R590 Localized enlarged lymph nodes: Secondary | ICD-10-CM | POA: Diagnosis not present

## 2020-04-21 DIAGNOSIS — A6920 Lyme disease, unspecified: Secondary | ICD-10-CM | POA: Diagnosis not present

## 2020-04-21 DIAGNOSIS — R519 Headache, unspecified: Secondary | ICD-10-CM | POA: Diagnosis not present

## 2020-04-21 NOTE — Progress Notes (Signed)
Patient denies new problems/concerns today.   °

## 2020-04-21 NOTE — Telephone Encounter (Signed)
Spoke with the patient, he is scheduled with Dr. Lucky Cowboy for a port placement on 04/25/20 with a 10:15 am arrival to the [facility]. Covid testing on 04/22/20 before 11:00 am. Pre-procedure instructions were discussed and per the patient he wrote down the information.

## 2020-04-21 NOTE — Progress Notes (Signed)
Hematology/Oncology follow up note [facility] Telephone:([phone]) [phone] [phone]   Patient Care Team: Olin Hauser, DO as PCP - General (Family Medicine)  REFERRING PROVIDER: Nobie Putnam *  CHIEF COMPLAINTS/REASON FOR VISIT:  Follow up for axillary mass  HISTORY OF PRESENTING ILLNESS:   Justin Wallace is a  74 y.o.  male with PMH listed below was seen in consultation at the request of  Nobie Putnam *  for evaluation of symptomatic anemia Patient reported history of sudden onset of diffuse body aches, decreased appetite, headache low-grade fever, profound weakness/fatigue about 2 weeks ago.  Prior to the onset of symptoms, he went to a funeral as well as applicable clinic.  He reports that he has received COVID-19 vaccination previously.  He moved from [place].  Daughter is an Therapist, sports and told him to get COVID-19 PCR checked and he was tested negative. Patient also reports a sudden drop of weight since the onset of symptoms.  Patient has a history of celiac disease.  No colonoscopy records or pathology records in current EMR.  Diagnosis was done by [facility] gastroenterology.  Patient reports that he was in his usual state of health until the onset of symptoms.  03/10/2020, patient was evaluated by primary care provider.  Blood work showed mild anemia with hemoglobin of 13.1, Iron panel showed decreased saturation of 8, ferritin 218, TIBC 291, patient was referred to hematology for evaluation of symptomatic anemia and iron deficiency.  Patient reports that her body aches, fever and headache symptoms have improved.  However he continues to feel very tired and fatigued.  Appetite has decreased and weight remains low.  # Physical examination showed left axillary mass  ultrasound confirmed a left axillary large soft tissue mass up to 6.8 cm with internal vascularity. 03/30/2020 CT chest with contrast showed soft tissue mass/enlarged lymph  node in the left axilla 6.3 cm, concerning for primary mass or metastatic lymphadenopathy.  There are additional prominent although much smaller right axillary lymph nodes, pretracheal and superior mediastinal lymph nodes.  Findings are concerning for malignancy such as lymphoma or nodal metastatic disease.   INTERVAL HISTORY Justin Wallace is a 74 y.o. 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  patient was treated for Lymes disease, he is on doxycycline 162m BID for 21 days course.  He feels better, appetite has improved and he has gained weight since last visit, gained 6 pounds.  Also he has had PET scan done and underwent axillary mass biopsy.  He presents to discuss results and management plan.      Review of Systems  Constitutional: Positive for fatigue. Negative for appetite change, chills, fever and unexpected weight change.  HENT:   Negative for hearing loss and voice change.   Eyes: Negative for eye problems and icterus.  Respiratory: Negative for chest tightness, cough and shortness of breath.   Cardiovascular: Negative for chest pain and leg swelling.  Gastrointestinal: Negative for abdominal distention and abdominal pain.  Endocrine: Negative for hot flashes.  Genitourinary: Negative for difficulty urinating, dysuria and frequency.   Musculoskeletal: Negative for arthralgias.  Skin: Negative for itching and rash.  Neurological: Negative for light-headedness and numbness.  Hematological: Negative for adenopathy. Does not bruise/bleed easily.  Psychiatric/Behavioral: Negative for confusion.    MEDICAL HISTORY:  Past Medical History:  Diagnosis Date  . Celiac disease     SURGICAL HISTORY: History reviewed. No pertinent surgical history.  SOCIAL HISTORY: Social History   Socioeconomic History   . Marital status: Married    Spouse name: Not on file  . Number of children: Not on file  . Years of education: Not on file  . Highest education level: Not on file  Occupational History  . Not on file  Tobacco Use  . Smoking status: Never Smoker  . Smokeless tobacco: Never Used  Vaping Use  . Vaping Use: Never used  Substance and Sexual Activity  . Alcohol use: Not Currently  . Drug use: Never  . Sexual activity: Never  Other Topics Concern  . Not on file  Social History Narrative  . Not on file   Social Determinants of Health   Financial Resource Strain:   . Difficulty of Paying Living Expenses: Not on file  Food Insecurity:   . Worried About Charity fundraiser in the Last Year: Not on file  . Ran Out of Food in the Last Year: Not on file  Transportation Needs:   . Lack of Transportation (Medical): Not on file  . Lack of Transportation (Non-Medical): Not on file  Physical Activity:   . Days of Exercise per Week: Not on file  . Minutes of Exercise per Session: Not on file  Stress:   . Feeling of Stress : Not on file  Social Connections:   . Frequency of Communication with Friends and Family: Not on file  . Frequency of Social Gatherings with Friends and Family: Not on file  . Attends Religious Services: Not on file  . Active Member of Clubs or Organizations: Not on file  . Attends Archivist Meetings: Not on file  . Marital Status: Not on file  Intimate Partner Violence:   . Fear of Current or Ex-Partner: Not on file  . Emotionally Abused: Not on file  . Physically Abused: Not on file  . Sexually Abused: Not on file    FAMILY HISTORY: Family History  Problem Relation Age of Onset  . Hyperlipidemia Mother   . Brain cancer Father     ALLERGIES:  has No Known Allergies.  MEDICATIONS:  Current Outpatient Medications  Medication Sig Dispense Refill  . doxycycline (VIBRA-TABS) 100 MG tablet Take 1 tablet (100 mg total) by mouth 2 (two) times daily.  For 21 days. Take with full glass of water, stay upright 30 min after taking. 42 tablet 0  . ferrous sulfate 325 (65 FE) MG EC tablet Take 325 mg by mouth daily with breakfast.     . Multiple Vitamin (THERA) TABS Take 1 tablet by mouth daily.    . tadalafil (CIALIS) 20 MG tablet May take 1/2 to 1 tablet as needed.     No current facility-administered medications for this visit.     PHYSICAL EXAMINATION: ECOG PERFORMANCE STATUS: 1 - Symptomatic but completely ambulatory Vitals:   04/21/20 0925  BP: (!) 102/57  Pulse: 71  Resp: 16  Temp: (!) 96.7 F (35.9 C)   Filed Weights   04/21/20 0925  Weight: 150 lb 14.4 oz (68.4 kg)    Physical Exam Constitutional:      General: He is not in acute distress. HENT:     Head: Normocephalic and atraumatic.  Eyes:     General: No scleral icterus. Cardiovascular:     Rate and Rhythm: Normal rate and regular rhythm.     Heart  sounds: Normal heart sounds.  Pulmonary:     Effort: Pulmonary effort is normal. No respiratory distress.     Breath sounds: No wheezing.  Abdominal:     General: Bowel sounds are normal. There is no distension.     Palpations: Abdomen is soft.  Musculoskeletal:        General: No deformity. Normal range of motion.     Cervical back: Normal range of motion and neck supple.     Comments: Left axillary enlarged lymph node.   Skin:    General: Skin is warm and dry.     Findings: No erythema or rash.  Neurological:     Mental Status: He is alert and oriented to person, place, and time. Mental status is at baseline.     Cranial Nerves: No cranial nerve deficit.     Coordination: Coordination normal.  Psychiatric:        Mood and Affect: Mood normal.     LABORATORY DATA:  I have reviewed the data as listed Lab Results  Component Value Date   WBC 6.8 03/17/2020   HGB 13.0 03/17/2020   HCT 38.2 (L) 03/17/2020   MCV 85.3 03/17/2020   PLT 337 03/17/2020   Recent Labs    03/10/20 1113 03/17/20 1029  NA  138 137  K 4.8 3.8  CL 106 104  CO2 27 27  GLUCOSE 94 97  BUN 23 21  CREATININE 0.91 1.09  CALCIUM 8.4* 8.1*  GFRNONAA 84 >60  GFRAA 98 >60  PROT 6.8 8.0  ALBUMIN  --  2.7*  AST 20 24  ALT 13 18  ALKPHOS  --  140*  BILITOT 0.4 0.3   Iron/TIBC/Ferritin/ %Sat    Component Value Date/Time   IRON 22 (L) 03/17/2020 1029   IRON 17 02/17/2018 0000   TIBC 286 03/17/2020 1029   TIBC 425 02/17/2018 0000   FERRITIN 284 03/17/2020 1029   IRONPCTSAT 8 (L) 03/17/2020 1029   IRONPCTSAT 8 (L) 03/10/2020 1113      RADIOGRAPHIC STUDIES: I have personally reviewed the radiological images as listed and agreed with the findings in the report. CT CHEST W CONTRAST  Result Date: 03/30/2020 CLINICAL DATA:  Left axillary mass detected by ultrasound, fatigue, weight loss EXAM: CT CHEST WITH CONTRAST TECHNIQUE: Multidetector CT imaging of the chest was performed during intravenous contrast administration. CONTRAST:  61m OMNIPAQUE IOHEXOL 300 MG/ML  SOLN COMPARISON:  None. FINDINGS: Cardiovascular: Aortic atherosclerosis. Normal heart size. Left coronary artery calcifications. No pericardial effusion. Mediastinum/Nodes: There is a soft tissue mass or grossly enlarged lymph node in the left axilla measuring 6.3 x 4.6 x 3.0 cm (series 2, image 47, series 6, image 155). There are additional prominent, although much smaller right axillary lymph nodes, pretracheal, and superior mediastinal lymph nodes, measuring up to 1.0 x 0.8 cm in the superior mediastinum (series 2, image 42). No other enlarged mediastinal, hilar, or axillary lymph nodes. Thyroid gland, trachea, and esophagus demonstrate no significant findings. Lungs/Pleura: Mild dependent bibasilar scarring and or atelectasis. No pleural effusion or pneumothorax. Upper Abdomen: No acute abnormality. Musculoskeletal: No chest wall mass or suspicious bone lesions identified. IMPRESSION: 1. There is a soft tissue mass or grossly enlarged lymph node in the left  axilla measuring 6.3 cm, concerning for primary mass or metastatic lymphadenopathy. 2. There are additional prominent, although much smaller right axillary lymph nodes, pretracheal, and superior mediastinal lymph nodes. Findings are concerning for malignancy such as lymphoma or nodal metastatic disease. Whole-body  PET-CT may be helpful to assess for abnormal metabolic activity and additional abnormal lymph nodes or other evidence of metastatic disease in the abdomen and pelvis. 3. No evidence of pulmonary metastatic disease. 4. Coronary artery disease.  Aortic Atherosclerosis (ICD10-I70.0). Electronically Signed   By: Eddie Candle M.D.   On: 03/30/2020 11:46   NM PET Image Initial (PI) Whole Body  Result Date: 04/12/2020 CLINICAL DATA:  Initial treatment strategy for LEFT axillary mass. EXAM: NUCLEAR MEDICINE PET WHOLE BODY TECHNIQUE: 7.8 mCi F-18 FDG was injected intravenously. Full-ring PET imaging was performed from the head to foot after the radiotracer. CT data was obtained and used for attenuation correction and anatomic localization. Fasting blood glucose: 85 mg/dl COMPARISON:  None. FINDINGS: Mediastinal blood pool activity: SUV max 2.3 HEAD/NECK: No hypermetabolic activity in the scalp. No hypermetabolic cervical lymph nodes. Incidental CT findings: none CHEST: Large LEFT axillary mass measures 2.7 by 5.0 cm short axis with intense metabolic activity (SUV max equal 28) Several smaller LEFT axillary nodes more superiorly are not pathologic by size criteria and have minimal metabolic activity (SUV max 2.0) Similar small RIGHT axillary lymph nodes are normal morphology with low metabolic activity (SUV max equal 2.7 (image 472.) Incidental CT findings: No suspicious pulmonary nodules. ABDOMEN/PELVIS: There is moderate activity in the porta hepatis region with SUV max equal 4.7. No discrete enlarged nodes are present. No abnormal activity liver. The spleen is normal size and normal metabolic activity. No  periaortic or iliac adenopathy. Pelvic adenopathy or inguinal adenopathy. Incidental CT findings: none SKELETON: No focal hypermetabolic activity to suggest skeletal metastasis. Incidental CT findings: No lytic lesion. EXTREMITIES: No abnormal hypermetabolic activity in the lower extremities. Incidental CT findings: No lytic or sclerotic lesion IMPRESSION: 1. Large intensely hypermetabolic LEFT axillary mass with differential including solitary massive lymph node versus soft tissue plasmacytoma. 2. Additional small bilateral axial lymph nodes with mild metabolic activity. Favor reactive nodes. 3. Metabolic activity through the porta hepatis region without enlarged nodes identified. Favor reactive activity or benign GI activity. 4. Normal spleen and bone marrow. 5. No lytic or hypermetabolic skeletal lesion. Electronically Signed   By: Suzy Bouchard M.D.   On: 04/12/2020 14:40   [place] LT UPPER EXTREM LTD SOFT TISSUE NON VASCULAR  Result Date: 03/24/2020 CLINICAL DATA:  Palpable abnormality in the left axilla. Weight loss, anemia, weakness EXAM: ULTRASOUND LEFT UPPER EXTREMITY LIMITED TECHNIQUE: Ultrasound examination of the upper extremity soft tissues was performed in the area of clinical concern. COMPARISON:  None. FINDINGS: Targeted ultrasound was performed at site of patient's palpable abnormality within the left axilla. Corresponding to this site is a large heterogeneously hypoechoic solid soft tissue mass within the subcutaneous soft tissues measuring approximately 6.8 x 2.0 x 3.7 cm. Internal vascularity is seen throughout the mass. Along the deep margin of this mass are multiple small morphologically normal appearing axillary lymph nodes. IMPRESSION: Large soft tissue mass within the left axilla measuring up to 6.8 cm with internal vascularity. Differential includes primary soft tissue neoplasm such as sarcoma versus a markedly enlarged axillary lymph node in the setting of lymphoma or other primary  malignancy. Contrast enhanced CT of the chest is recommended for further evaluation. These results will be called to the ordering clinician or representative by the Radiologist Assistant, and communication documented in the PACS or [employer]. Electronically Signed   By: Davina Poke D.O.   On: 03/24/2020 08:52   [place] CORE BIOPSY (LYMPH NODES)  Result Date: 04/13/2020 INDICATION: No known  primary, now with hypermetabolic left axillary nodal mass. Please perform ultrasound-guided biopsy for tissue diagnostic purposes. EXAM: ULTRASOUND-GUIDED LEFT AXILLARY LYMPH NODE BIOPSY COMPARISON:  PET-CT-04/12/2020; chest CT-03/30/2020 MEDICATIONS: None ANESTHESIA/SEDATION: None COMPLICATIONS: None immediate. TECHNIQUE: Informed written consent was obtained from the patient after a discussion of the risks, benefits and alternatives to treatment. Questions regarding the procedure were encouraged and answered. Initial ultrasound scanning demonstrated a large, at least 6.7 x 2.7 cm left axillary nodal conglomeration compatible with the findings seen on preceding PET-CT. An ultrasound image was saved for documentation purposes. The procedure was planned. A timeout was performed prior to the initiation of the procedure. The operative was prepped and draped in the usual sterile fashion, and a sterile drape was applied covering the operative field. A timeout was performed prior to the initiation of the procedure. Local anesthesia was provided with 1% lidocaine with epinephrine. Under direct ultrasound guidance, an 18 gauge core needle device was utilized to obtain to obtain 7 core needle biopsies of the hypermetabolic left axillary lymph node. The samples were placed in saline and submitted to pathology. The needle was removed and hemostasis was achieved with manual compression. Post procedure scan was negative for significant hematoma. A dressing was placed. The patient tolerated the procedure well without immediate  postprocedural complication. IMPRESSION: Technically successful ultrasound guided biopsy of dominant hypermetabolic left axillary lymph node. Electronically Signed   By: Sandi Mariscal M.D.   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 Goals of care, counseling/discussion    #Left axillary mass, 04/13/2020 PET Image were independently viewed by me and discussed with patient and wife.  Large intensely hypermetabolic LEFT axillary mass  Additional small bilateral axial lymph nodes with mild metabolic activity Metabolic activity through the porta hepatis region without enlarged nodes identified. Normal spleen and bone marrow. No lytic or hypermetabolic skeletal lesion.  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 Awaiting FISH to determine classification, Burkitt lymphoma vs DLBCL vs other high grade lymphoma.  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   Recommend bone marrow biopsy, MUGA for pre-chemotherapy evaluation and medi port placement.  He will most likely need R-CHOP vs Dose adjusted R- EPOCH vs other lymphoma regimens Will send him to chemotherapy class once the lymphoma is further classified.  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  Check HIV status  The diagnosis and care plan were discussed with patient in detail.  NCCN guidelines were reviewed and shared with patient.   The goal of treatment was discussed. Chemotherapy education was provided.  I explained to the patient the risks and benefits of chemotherapy including all but not limited to hair loss, mouth sore, nausea, vomiting, diarrhea, low blood counts, bleeding, neuropathy, cardiovascular disease,  and risk of life threatening infection and even death, secondary malignancy etc.  . Patient voices understanding and willing to proceed chemotherapy.   #  Antiemetics-Zofran and Compazine; EMLA cream will be sent to pharmacy  Supportive care measures are necessary for patient well-being and will be provided as necessary. We spent sufficient time to discuss many aspect of care, questions were answered to patient's satisfaction. Follow up to be determined. Pending above testing.    Orders Placed This Encounter  Procedures  . NM Cardiac [place] Rest    Standing Status:   Future  Standing Expiration Date:   04/21/2021    Order Specific Question:   Preferred imaging location?    Answer:   [facility]    Order Specific Question:   Radiology Contrast Protocol - do NOT remove file path    Answer:   _0 [email or web address]  . CT BONE MARROW BIOPSY & ASPIRATION    Standing Status:   Future    Standing Expiration Date:   04/21/2021    Order Specific Question:   Reason for Exam (SYMPTOM  OR DIAGNOSIS REQUIRED)    Answer:   high grade lymphoma staging    Order Specific Question:   Preferred location?    Answer:   [facility]    Order Specific Question:   Radiology Contrast Protocol - do NOT remove file path    Answer:   _1 [email or web address]  . HIV ANTIBODY (ROUTINE TETSING W RELFEX)    Standing Status:   Future    Standing Expiration Date:   04/21/2021  . Ambulatory referral to Vascular Surgery    Referral Priority:   Routine    Referral Type:   Surgical    Referral Reason:   Specialty Services Required    Referred to Provider:   Algernon Huxley, MD    Requested Specialty:   Vascular Surgery    Number of Visits Requested:   1    All questions were answered. The patient knows to call the clinic with any problems questions or concerns.  cc Jeneen Rinks, MD, PhD Hematology Oncology [facility] at [facility] Pager- [phone] 04/21/2020

## 2020-04-21 NOTE — H&P (View-Only) (Signed)
Hematology/Oncology follow up note [facility] [phone] [phone]   Patient Care Team: Olin Hauser, DO as PCP - General (Family Medicine)  REFERRING PROVIDER: Nobie Putnam *  CHIEF COMPLAINTS/REASON FOR VISIT:  Follow up for axillary mass  HISTORY OF PRESENTING ILLNESS:   Justin Wallace is a  74 y.o.  male with PMH listed below was seen in consultation at the request of  Nobie Putnam *  for evaluation of symptomatic anemia Patient reported history of sudden onset of diffuse body aches, decreased appetite, headache low-grade fever, profound weakness/fatigue about 2 weeks ago.  Prior to the onset of symptoms, he went to a funeral as well as applicable clinic.  He reports that he has received COVID-19 vaccination previously.  He moved from [place].  Daughter is an Therapist, sports and told him to get COVID-19 PCR checked and he was tested negative. Patient also reports a sudden drop of weight since the onset of symptoms.  Patient has a history of celiac disease.  No colonoscopy records or pathology records in current EMR.  Diagnosis was done by [facility] gastro[facility]ology.  Patient reports that he was in his usual state of health until the onset of symptoms.  03/10/2020, patient was evaluated by primary care provider.  Blood work showed mild anemia with hemoglobin of 13.1, Iron panel showed decreased saturation of 8, ferritin 218, TIBC 291, patient was referred to hematology for evaluation of symptomatic anemia and iron deficiency.  Patient reports that her body aches, fever and headache symptoms have improved.  However he continues to feel very tired and fatigued.  Appetite has decreased and weight remains low.  # Physical examination showed left axillary mass  ultrasound confirmed a left axillary large soft tissue mass up to 6.8 cm with internal vascularity. 03/30/2020 CT chest with contrast showed soft tissue mass/enlarged lymph  node in the left axilla 6.3 cm, concerning for primary mass or metastatic lymphadenopathy.  There are additional prominent although much smaller right axillary lymph nodes, pretracheal and superior mediastinal lymph nodes.  Findings are concerning for malignancy such as lymphoma or nodal metastatic disease.   INTERVAL HISTORY Justin Wallace is a 74 y.o. 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  patient was treated for Lymes disease, he is on doxycycline 154m BID for 21 days course.  He feels better, appetite has improved and he has gained weight since last visit, gained 6 pounds.  Also he has had PET scan done and underwent axillary mass biopsy.  He presents to discuss results and management plan.      Review of Systems  Constitutional: Positive for fatigue. Negative for appetite change, chills, fever and unexpected weight change.  HENT:   Negative for hearing loss and voice change.   Eyes: Negative for eye problems and icterus.  Respiratory: Negative for chest tightness, cough and shortness of breath.   Cardiovascular: Negative for chest pain and leg swelling.  Gastrointestinal: Negative for abdominal distention and abdominal pain.  Endocrine: Negative for hot flashes.  Genitourinary: Negative for difficulty urinating, dysuria and frequency.   Musculoskeletal: Negative for arthralgias.  Skin: Negative for itching and rash.  Neurological: Negative for light-headedness and numbness.  Hematological: Negative for adenopathy. Does not bruise/bleed easily.  Psychiatric/Behavioral: Negative for confusion.    MEDICAL HISTORY:  Past Medical History:  Diagnosis Date  . Celiac disease     SURGICAL HISTORY: History reviewed. No pertinent surgical history.  SOCIAL HISTORY: Social History   Socioeconomic History   . Marital status: Married    Spouse name: Not on file  . Number of children: Not on file  . Years of education: Not on file  . Highest education level: Not on file  Occupational History  . Not on file  Tobacco Use  . Smoking status: Never Smoker  . Smokeless tobacco: Never Used  Vaping Use  . Vaping Use: Never used  Substance and Sexual Activity  . Alcohol use: Not Currently  . Drug use: Never  . Sexual activity: Never  Other Topics Concern  . Not on file  Social History Narrative  . Not on file   Social Determinants of Health   Financial Resource Strain:   . Difficulty of Paying Living Expenses: Not on file  Food Insecurity:   . Worried About Running Out of Food in the Last Year: Not on file  . Ran Out of Food in the Last Year: Not on file  Transportation Needs:   . Lack of Transportation (Medical): Not on file  . Lack of Transportation (Non-Medical): Not on file  Physical Activity:   . Days of Exercise per Week: Not on file  . Minutes of Exercise per Session: Not on file  Stress:   . Feeling of Stress : Not on file  Social Connections:   . Frequency of Communication with Friends and Family: Not on file  . Frequency of Social Gatherings with Friends and Family: Not on file  . Attends Religious Services: Not on file  . Active Member of Clubs or Organizations: Not on file  . Attends Club or Organization Meetings: Not on file  . Marital Status: Not on file  Intimate Partner Violence:   . Fear of Current or Ex-Partner: Not on file  . Emotionally Abused: Not on file  . Physically Abused: Not on file  . Sexually Abused: Not on file    FAMILY HISTORY: Family History  Problem Relation Age of Onset  . Hyperlipidemia Mother   . Brain cancer Father     ALLERGIES:  has No Known Allergies.  MEDICATIONS:  Current Outpatient Medications  Medication Sig Dispense Refill  . doxycycline (VIBRA-TABS) 100 MG tablet Take 1 tablet (100 mg total) by mouth 2 (two) times daily.  For 21 days. Take with full glass of water, stay upright 30 min after taking. 42 tablet 0  . ferrous sulfate 325 (65 FE) MG EC tablet Take 325 mg by mouth daily with breakfast.     . Multiple Vitamin (THERA) TABS Take 1 tablet by mouth daily.    . tadalafil (CIALIS) 20 MG tablet May take 1/2 to 1 tablet as needed.     No current facility-administered medications for this visit.     PHYSICAL EXAMINATION: ECOG PERFORMANCE STATUS: 1 - Symptomatic but completely ambulatory Vitals:   04/21/20 0925  BP: (!) 102/57  Pulse: 71  Resp: 16  Temp: (!) 96.7 F (35.9 C)   Filed Weights   04/21/20 0925  Weight: 150 lb 14.4 oz (68.4 kg)    Physical Exam Constitutional:      General: He is not in acute distress. HENT:     Head: Normocephalic and atraumatic.  Eyes:     General: No scleral icterus. Cardiovascular:     Rate and Rhythm: Normal rate and regular rhythm.     Heart   sounds: Normal heart sounds.  Pulmonary:     Effort: Pulmonary effort is normal. No respiratory distress.     Breath sounds: No wheezing.  Abdominal:     General: Bowel sounds are normal. There is no distension.     Palpations: Abdomen is soft.  Musculoskeletal:        General: No deformity. Normal range of motion.     Cervical back: Normal range of motion and neck supple.     Comments: Left axillary enlarged lymph node.   Skin:    General: Skin is warm and dry.     Findings: No erythema or rash.  Neurological:     Mental Status: He is alert and oriented to person, place, and time. Mental status is at baseline.     Cranial Nerves: No cranial nerve deficit.     Coordination: Coordination normal.  Psychiatric:        Mood and Affect: Mood normal.     LABORATORY DATA:  I have reviewed the data as listed Lab Results  Component Value Date   WBC 6.8 03/17/2020   HGB 13.0 03/17/2020   HCT 38.2 (L) 03/17/2020   MCV 85.3 03/17/2020   PLT 337 03/17/2020   Recent Labs    03/10/20 1113 03/17/20 1029  NA  138 137  K 4.8 3.8  CL 106 104  CO2 27 27  GLUCOSE 94 97  BUN 23 21  CREATININE 0.91 1.09  CALCIUM 8.4* 8.1*  GFRNONAA 84 >60  GFRAA 98 >60  PROT 6.8 8.0  ALBUMIN  --  2.7*  AST 20 24  ALT 13 18  ALKPHOS  --  140*  BILITOT 0.4 0.3   Iron/TIBC/Ferritin/ %Sat    Component Value Date/Time   IRON 22 (L) 03/17/2020 1029   IRON 17 02/17/2018 0000   TIBC 286 03/17/2020 1029   TIBC 425 02/17/2018 0000   FERRITIN 284 03/17/2020 1029   IRONPCTSAT 8 (L) 03/17/2020 1029   IRONPCTSAT 8 (L) 03/10/2020 1113      RADIOGRAPHIC STUDIES: I have personally reviewed the radiological images as listed and agreed with the findings in the report. CT CHEST W CONTRAST  Result Date: 03/30/2020 CLINICAL DATA:  Left axillary mass detected by ultrasound, fatigue, weight loss EXAM: CT CHEST WITH CONTRAST TECHNIQUE: Multidetector CT imaging of the chest was performed during intravenous contrast administration. CONTRAST:  75mL OMNIPAQUE IOHEXOL 300 MG/ML  SOLN COMPARISON:  None. FINDINGS: Cardiovascular: Aortic atherosclerosis. Normal heart size. Left coronary artery calcifications. No pericardial effusion. Mediastinum/Nodes: There is a soft tissue mass or grossly enlarged lymph node in the left axilla measuring 6.3 x 4.6 x 3.0 cm (series 2, image 47, series 6, image 155). There are additional prominent, although much smaller right axillary lymph nodes, pretracheal, and superior mediastinal lymph nodes, measuring up to 1.0 x 0.8 cm in the superior mediastinum (series 2, image 42). No other enlarged mediastinal, hilar, or axillary lymph nodes. Thyroid gland, trachea, and esophagus demonstrate no significant findings. Lungs/Pleura: Mild dependent bibasilar scarring and or atelectasis. No pleural effusion or pneumothorax. Upper Abdomen: No acute abnormality. Musculoskeletal: No chest wall mass or suspicious bone lesions identified. IMPRESSION: 1. There is a soft tissue mass or grossly enlarged lymph node in the left  axilla measuring 6.3 cm, concerning for primary mass or metastatic lymphadenopathy. 2. There are additional prominent, although much smaller right axillary lymph nodes, pretracheal, and superior mediastinal lymph nodes. Findings are concerning for malignancy such as lymphoma or nodal metastatic disease. Whole-body   PET-CT may be helpful to assess for abnormal metabolic activity and additional abnormal lymph nodes or other evidence of metastatic disease in the abdomen and pelvis. 3. No evidence of pulmonary metastatic disease. 4. Coronary artery disease.  Aortic Atherosclerosis (ICD10-I70.0). Electronically Signed   By: Eddie Candle M.D.   On: 03/30/2020 11:46   NM PET Image Initial (PI) Whole Body  Result Date: 04/12/2020 CLINICAL DATA:  Initial treatment strategy for LEFT axillary mass. EXAM: NUCLEAR MEDICINE PET WHOLE BODY TECHNIQUE: 7.8 mCi F-18 FDG was injected intravenously. Full-ring PET imaging was performed from the head to foot after the radiotracer. CT data was obtained and used for attenuation correction and anatomic localization. Fasting blood glucose: 85 mg/dl COMPARISON:  None. FINDINGS: Mediastinal blood pool activity: SUV max 2.3 HEAD/NECK: No hypermetabolic activity in the scalp. No hypermetabolic cervical lymph nodes. Incidental CT findings: none CHEST: Large LEFT axillary mass measures 2.7 by 5.0 cm short axis with intense metabolic activity (SUV max equal 28) Several smaller LEFT axillary nodes more superiorly are not pathologic by size criteria and have minimal metabolic activity (SUV max 2.0) Similar small RIGHT axillary lymph nodes are normal morphology with low metabolic activity (SUV max equal 2.7 (image 472.) Incidental CT findings: No suspicious pulmonary nodules. ABDOMEN/PELVIS: There is moderate activity in the porta hepatis region with SUV max equal 4.7. No discrete enlarged nodes are present. No abnormal activity liver. The spleen is normal size and normal metabolic activity. No  periaortic or iliac adenopathy. Pelvic adenopathy or inguinal adenopathy. Incidental CT findings: none SKELETON: No focal hypermetabolic activity to suggest skeletal metastasis. Incidental CT findings: No lytic lesion. EXTREMITIES: No abnormal hypermetabolic activity in the lower extremities. Incidental CT findings: No lytic or sclerotic lesion IMPRESSION: 1. Large intensely hypermetabolic LEFT axillary mass with differential including solitary massive lymph node versus soft tissue plasmacytoma. 2. Additional small bilateral axial lymph nodes with mild metabolic activity. Favor reactive nodes. 3. Metabolic activity through the porta hepatis region without enlarged nodes identified. Favor reactive activity or benign GI activity. 4. Normal spleen and bone marrow. 5. No lytic or hypermetabolic skeletal lesion. Electronically Signed   By: Suzy Bouchard M.D.   On: 04/12/2020 14:40   [place] LT UPPER EXTREM LTD SOFT TISSUE NON VASCULAR  Result Date: 03/24/2020 CLINICAL DATA:  Palpable abnormality in the left axilla. Weight loss, anemia, weakness EXAM: ULTRASOUND LEFT UPPER EXTREMITY LIMITED TECHNIQUE: Ultrasound examination of the upper extremity soft tissues was performed in the area of clinical concern. COMPARISON:  None. FINDINGS: Targeted ultrasound was performed at site of patient's palpable abnormality within the left axilla. Corresponding to this site is a large heterogeneously hypoechoic solid soft tissue mass within the subcutaneous soft tissues measuring approximately 6.8 x 2.0 x 3.7 cm. Internal vascularity is seen throughout the mass. Along the deep margin of this mass are multiple small morphologically normal appearing axillary lymph nodes. IMPRESSION: Large soft tissue mass within the left axilla measuring up to 6.8 cm with internal vascularity. Differential includes primary soft tissue neoplasm such as sarcoma versus a markedly enlarged axillary lymph node in the setting of lymphoma or other primary  malignancy. Contrast enhanced CT of the chest is recommended for further evaluation. These results will be called to the ordering clinician or representative by the Radiologist Assistant, and communication documented in the PACS or [employer]. Electronically Signed   By: Davina Poke D.O.   On: 03/24/2020 08:52   [place] CORE BIOPSY (LYMPH NODES)  Result Date: 04/13/2020 INDICATION: No known  primary, now with hypermetabolic left axillary nodal mass. Please perform ultrasound-guided biopsy for tissue diagnostic purposes. EXAM: ULTRASOUND-GUIDED LEFT AXILLARY LYMPH NODE BIOPSY COMPARISON:  PET-CT-04/12/2020; chest CT-03/30/2020 MEDICATIONS: None ANESTHESIA/SEDATION: None COMPLICATIONS: None immediate. TECHNIQUE: Informed written consent was obtained from the patient after a discussion of the risks, benefits and alternatives to treatment. Questions regarding the procedure were encouraged and answered. Initial ultrasound scanning demonstrated a large, at least 6.7 x 2.7 cm left axillary nodal conglomeration compatible with the findings seen on preceding PET-CT. An ultrasound image was saved for documentation purposes. The procedure was planned. A timeout was performed prior to the initiation of the procedure. The operative was prepped and draped in the usual sterile fashion, and a sterile drape was applied covering the operative field. A timeout was performed prior to the initiation of the procedure. Local anesthesia was provided with 1% lidocaine with epinephrine. Under direct ultrasound guidance, an 18 gauge core needle device was utilized to obtain to obtain 7 core needle biopsies of the hypermetabolic left axillary lymph node. The samples were placed in saline and submitted to pathology. The needle was removed and hemostasis was achieved with manual compression. Post procedure scan was negative for significant hematoma. A dressing was placed. The patient tolerated the procedure well without immediate  postprocedural complication. IMPRESSION: Technically successful ultrasound guided biopsy of dominant hypermetabolic left axillary lymph node. Electronically Signed   By: John  Watts M.D.   On: 04/13/2020 15:49      ASSESSMENT & PLAN:  1. 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HCC)   2. Goals of care, counseling/discussion    #Left axillary mass, 04/13/2020 PET Image were independently viewed by me and discussed with patient and wife.  Large intensely hypermetabolic LEFT axillary mass  Additional small bilateral axial lymph nodes with mild metabolic activity Metabolic activity through the porta hepatis region without enlarged nodes identified. Normal spleen and bone marrow. No lytic or hypermetabolic skeletal lesion.  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 Awaiting FISH to determine classification, Burkitt lymphoma vs DLBCL vs other high grade lymphoma.  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   Recommend bone marrow biopsy, MUGA for pre-chemotherapy evaluation and medi port placement.  He will most likely need R-CHOP vs Dose adjusted R- EPOCH vs other lymphoma regimens Will send him to chemotherapy class once the lymphoma is further classified.  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  Check HIV status  The diagnosis and care plan were discussed with patient in detail.  NCCN guidelines were reviewed and shared with patient.   The goal of treatment was discussed. Chemotherapy education was provided.  I explained to the patient the risks and benefits of chemotherapy including all but not limited to hair loss, mouth sore, nausea, vomiting, diarrhea, low blood counts, bleeding, neuropathy, cardiovascular disease,  and risk of life threatening infection and even death, secondary malignancy etc.  . Patient voices understanding and willing to proceed chemotherapy.   #  Antiemetics-Zofran and Compazine; EMLA cream will be sent to pharmacy  Supportive care measures are necessary for patient well-being and will be provided as necessary. We spent sufficient time to discuss many aspect of care, questions were answered to patient's satisfaction. Follow up to be determined. Pending above testing.    Orders Placed This Encounter  Procedures  . NM Cardiac Muga Rest    Standing Status:   Future      Standing Expiration Date:   04/21/2021    Order Specific Question:   Preferred imaging location?    Answer:   [facility]    Order Specific Question:   Radiology Contrast Protocol - do NOT remove file path    Answer:   [email or web address]  . CT BONE MARROW BIOPSY & ASPIRATION    Standing Status:   Future    Standing Expiration Date:   04/21/2021    Order Specific Question:   Reason for Exam (SYMPTOM  OR DIAGNOSIS REQUIRED)    Answer:   high grade lymphoma staging    Order Specific Question:   Preferred location?    Answer:   [facility]    Order Specific Question:   Radiology Contrast Protocol - do NOT remove file path    Answer:   [email or web address]  . HIV ANTIBODY (ROUTINE TETSING W RELFEX)    Standing Status:   Future    Standing Expiration Date:   04/21/2021  . Ambulatory referral to Vascular Surgery    Referral Priority:   Routine    Referral Type:   Surgical    Referral Reason:   Specialty Services Required    Referred to Provider:   Dew, Jason S, MD    Requested Specialty:   Vascular Surgery    Number of Visits Requested:   1    All questions were answered. The patient knows to call the clinic with any problems questions or concerns.  cc Karamalegos, Alexander *    Elodie Panameno, MD, PhD Hematology Oncology [facility] Pager- [phone] 04/21/2020  

## 2020-04-22 ENCOUNTER — Other Ambulatory Visit
Admission: RE | Admit: 2020-04-22 | Discharge: 2020-04-22 | Disposition: A | Discharge status: Home / Self Care | Payer: Medicare HMO | Source: Ambulatory Visit | Attending: Vascular Surgery | Admitting: Vascular Surgery

## 2020-04-22 ENCOUNTER — Other Ambulatory Visit: Payer: Self-pay

## 2020-04-22 ENCOUNTER — Telehealth: Payer: Self-pay

## 2020-04-22 DIAGNOSIS — Z20822 Contact with and (suspected) exposure to covid-19: Secondary | ICD-10-CM | POA: Insufficient documentation

## 2020-04-22 DIAGNOSIS — Z01812 Encounter for preprocedural laboratory examination: Secondary | ICD-10-CM | POA: Diagnosis not present

## 2020-04-22 LAB — SARS CORONAVIRUS 2 (TAT 6-24 HRS): SARS Coronavirus 2: NEGATIVE

## 2020-04-22 NOTE — Telephone Encounter (Signed)
Justin Wallace is scheduled for his BMB on Mon 05/02/20 at 8:30a.  He needs to arrive at 7:30a and the IR Nurse will call him to go over all of his instructions with him at the end of next week. Spoke to pt and notified him of appt.

## 2020-04-25 ENCOUNTER — Ambulatory Visit
Admission: RE | Admit: 2020-04-25 | Discharge: 2020-04-25 | Disposition: A | Discharge status: Home / Self Care | Payer: Medicare HMO | Attending: Vascular Surgery | Admitting: Vascular Surgery

## 2020-04-25 ENCOUNTER — Other Ambulatory Visit: Payer: Self-pay

## 2020-04-25 ENCOUNTER — Other Ambulatory Visit (INDEPENDENT_AMBULATORY_CARE_PROVIDER_SITE_OTHER): Payer: Self-pay | Admitting: Nurse Practitioner

## 2020-04-25 ENCOUNTER — Encounter: Payer: Self-pay | Admitting: Vascular Surgery

## 2020-04-25 ENCOUNTER — Encounter
Admission: RE | Disposition: A | Discharge status: Home / Self Care | Payer: Self-pay | Source: Home / Self Care | Attending: Vascular Surgery

## 2020-04-25 DIAGNOSIS — Z79899 Other long term (current) drug therapy: Secondary | ICD-10-CM | POA: Diagnosis not present

## 2020-04-25 DIAGNOSIS — K900 Celiac disease: Secondary | ICD-10-CM | POA: Diagnosis not present

## 2020-04-25 DIAGNOSIS — C8510 Unspecified B-cell lymphoma, unspecified site: Secondary | ICD-10-CM | POA: Diagnosis not present

## 2020-04-25 DIAGNOSIS — D509 Iron deficiency anemia, unspecified: Secondary | ICD-10-CM | POA: Diagnosis not present

## 2020-04-25 DIAGNOSIS — C8590 Non-Hodgkin lymphoma, unspecified, unspecified site: Secondary | ICD-10-CM | POA: Diagnosis not present

## 2020-04-25 HISTORY — DX: Non-Hodgkin lymphoma, unspecified, unspecified site: C85.90

## 2020-04-25 HISTORY — PX: PORTA CATH INSERTION: CATH118285

## 2020-04-25 HISTORY — DX: Lyme disease, unspecified: A69.20

## 2020-04-25 SURGERY — PORTA CATH INSERTION
Anesthesia: Moderate Sedation

## 2020-04-25 MED ORDER — SODIUM CHLORIDE 0.9 % IV SOLN
Freq: Once | INTRAVENOUS | Status: DC
  Filled 2020-04-25: qty 2

## 2020-04-25 MED ORDER — CEFAZOLIN SODIUM-DEXTROSE 2-4 GM/100ML-% IV SOLN
INTRAVENOUS | Status: AC
  Administered 2020-04-25: 2 g via INTRAVENOUS
  Filled 2020-04-25: qty 100

## 2020-04-25 MED ORDER — MIDAZOLAM HCL 2 MG/2ML IJ SOLN
INTRAMUSCULAR | Status: DC | PRN
  Administered 2020-04-25: 2 mg via INTRAVENOUS
  Administered 2020-04-25: 1 mg via INTRAVENOUS

## 2020-04-25 MED ORDER — FAMOTIDINE 20 MG PO TABS: 40.0000 mg | ORAL_TABLET | Freq: Once | ORAL | Status: DC | PRN

## 2020-04-25 MED ORDER — DIPHENHYDRAMINE HCL 50 MG/ML IJ SOLN: 50.0000 mg | Freq: Once | INTRAMUSCULAR | Status: DC | PRN

## 2020-04-25 MED ORDER — MIDAZOLAM HCL 5 MG/5ML IJ SOLN
INTRAMUSCULAR | Status: AC
  Filled 2020-04-25: qty 5

## 2020-04-25 MED ORDER — FENTANYL CITRATE (PF) 100 MCG/2ML IJ SOLN
INTRAMUSCULAR | Status: DC | PRN
Start: 2020-04-25 — End: 2020-04-25
  Administered 2020-04-25: 50 ug via INTRAVENOUS

## 2020-04-25 MED ORDER — METHYLPREDNISOLONE SODIUM SUCC 125 MG IJ SOLR: 125.0000 mg | Freq: Once | INTRAMUSCULAR | Status: DC | PRN

## 2020-04-25 MED ORDER — CEFAZOLIN SODIUM-DEXTROSE 2-4 GM/100ML-% IV SOLN: 2.0000 g | Freq: Once | INTRAVENOUS | Status: AC

## 2020-04-25 MED ORDER — SODIUM CHLORIDE 0.9 % IV SOLN: INTRAVENOUS | Status: DC

## 2020-04-25 MED ORDER — CHLORHEXIDINE GLUCONATE CLOTH 2 % EX PADS: 6.0000 | MEDICATED_PAD | Freq: Every day | CUTANEOUS | Status: DC

## 2020-04-25 MED ORDER — FENTANYL CITRATE (PF) 100 MCG/2ML IJ SOLN
INTRAMUSCULAR | Status: AC
  Filled 2020-04-25: qty 2

## 2020-04-25 MED ORDER — HYDROMORPHONE HCL 1 MG/ML IJ SOLN: 1.0000 mg | Freq: Once | INTRAMUSCULAR | Status: DC | PRN

## 2020-04-25 MED ORDER — ONDANSETRON HCL 4 MG/2ML IJ SOLN: 4.0000 mg | Freq: Four times a day (QID) | INTRAMUSCULAR | Status: DC | PRN

## 2020-04-25 MED ORDER — MIDAZOLAM HCL 2 MG/ML PO SYRP: 8.0000 mg | ORAL_SOLUTION | Freq: Once | ORAL | Status: DC | PRN

## 2020-04-25 SURGICAL SUPPLY — 12 items
DERMABOND ADVANCED (GAUZE/BANDAGES/DRESSINGS) ×1
DERMABOND ADVANCED .7 DNX12 (GAUZE/BANDAGES/DRESSINGS) ×1 IMPLANT
KIT PORT POWER 8FR ISP CVUE (Port) ×2 IMPLANT
PACK ANGIOGRAPHY (CUSTOM PROCEDURE TRAY) ×2 IMPLANT
PENCIL ELECTRO HAND CTR (MISCELLANEOUS) ×2 IMPLANT
SUT MNCRL 4-0 (SUTURE) ×1
SUT MNCRL 4-0 27XMFL (SUTURE) ×1
SUT PROLENE 0 CT 1 30 (SUTURE) ×2 IMPLANT
SUT VIC AB 3-0 CT1 27 (SUTURE) ×1
SUT VIC AB 3-0 CT1 TAPERPNT 27 (SUTURE) ×1 IMPLANT
SUTURE MNCRL 4-0 27XMF (SUTURE) ×1 IMPLANT
TOWEL OR 17X26 4PK STRL BLUE (TOWEL DISPOSABLE) ×2 IMPLANT

## 2020-04-25 NOTE — Interval H&P Note (Signed)
History and Physical Interval Note:  04/25/2020 10:33 AM  Justin Wallace  has presented today for surgery, with the diagnosis of Porta Cath Placement   B-cell lymphoma  Covid  Sept 24.  The various methods of treatment have been discussed with the patient and family. After consideration of risks, benefits and other options for treatment, the patient has consented to  Procedure(s): PORTA CATH INSERTION (N/A) as a surgical intervention.  The patient's history has been reviewed, patient examined, no change in status, stable for surgery.  I have reviewed the patient's chart and labs.  Questions were answered to the patient's satisfaction.     Leotis Pain

## 2020-04-25 NOTE — Discharge Instructions (Signed)
Moderate Conscious Sedation, Adult, Care After These instructions provide you with information about caring for yourself after your procedure. Your health care provider may also give you more specific instructions. Your treatment has been planned according to current medical practices, but problems sometimes occur. Call your health care provider if you have any problems or questions after your procedure. What can I expect after the procedure? After your procedure, it is common:  To feel sleepy for several hours.  To feel clumsy and have poor balance for several hours.  To have poor judgment for several hours.  To vomit if you eat too soon. Follow these instructions at home: For at least 24 hours after the procedure:   Do not: ? Participate in activities where you could fall or become injured. ? Drive. ? Use heavy machinery. ? Drink alcohol. ? Take sleeping pills or medicines that cause drowsiness. ? Make important decisions or sign legal documents. ? Take care of children on your own.  Rest. Eating and drinking  Follow the diet recommended by your health care provider.  If you vomit: ? Drink water, juice, or soup when you can drink without vomiting. ? Make sure you have little or no nausea before eating solid foods. General instructions  Have a responsible adult stay with you until you are awake and alert.  Take over-the-counter and prescription medicines only as told by your health care provider.  If you smoke, do not smoke without supervision.  Keep all follow-up visits as told by your health care provider. This is important. Contact a health care provider if:  You keep feeling nauseous or you keep vomiting.  You feel light-headed.  You develop a rash.  You have a fever. Get help right away if:  You have trouble breathing. This information is not intended to replace advice given to you by your health care provider. Make sure you discuss any questions you have  with your health care provider. Document Revised: 06/28/2017 Document Reviewed: 11/05/2015 Elsevier Patient Education  [employer] Guide    Moderate Conscious Sedation, Adult, Care After These instructions provide you with information about caring for yourself after your procedure. Your health care provider may also give you more specific instructions. Your treatment has been planned according to current medical practices, but problems sometimes occur. Call your health care provider if you have any problems or questions after your procedure. What can I expect after the procedure? After your procedure, it is common:  To feel sleepy for several hours.  To feel clumsy and have poor balance for several hours.  To have poor judgment for several hours.  To vomit if you eat too soon. Follow these instructions at home: For at least 24 hours after the procedure:   Do not: ? Participate in activities where you could fall or become injured. ? Drive. ? Use heavy machinery. ? Drink alcohol. ? Take sleeping pills or medicines that cause drowsiness. ? Make important decisions or sign legal documents. ? Take care of children on your own.  Rest. Eating and drinking  Follow the diet recommended by your health care provider.  If you vomit: ? Drink water, juice, or soup when you can drink without vomiting. ? Make sure you have little or no nausea before eating solid foods. General instructions  Have a responsible adult stay with you until you are awake and alert.  Take over-the-counter and prescription medicines only as told by your health care provider.  If you smoke, do not smoke without supervision.  Keep all follow-up visits as told by your health care provider. This is important. Contact a health care provider if:  You keep feeling nauseous or you keep vomiting.  You feel light-headed.  You develop a rash.  You have a fever. Get help right  away if:  You have trouble breathing. This information is not intended to replace advice given to you by your health care provider. Make sure you discuss any questions you have with your health care provider. Document Revised: 06/28/2017 Document Reviewed: 11/05/2015 Elsevier Patient Education  [employer].  An implanted port is a device that is placed under the skin. It is usually placed in the chest. The device can be used to give IV medicine, to take blood, or for dialysis. You may have an implanted port if:  You need IV medicine that would be irritating to the small veins in your hands or arms.  You need IV medicines, such as antibiotics, for a long period of time.  You need IV nutrition for a long period of time.  You need dialysis. Having a port means that your health care provider will not need to use the veins in your arms for these procedures. You may have fewer limitations when using a port than you would if you used other types of long-term IVs, and you will likely be able to return to normal activities after your incision heals. An implanted port has two main parts:  Reservoir. The reservoir is the part where a needle is inserted to give medicines or draw blood. The reservoir is round. After it is placed, it appears as a small, raised area under your skin.  Catheter. The catheter is a thin, flexible tube that connects the reservoir to a vein. Medicine that is inserted into the reservoir goes into the catheter and then into the vein. How is my port accessed? To access your port:  A numbing cream may be placed on the skin over the port site.  Your health care provider will put on a mask and sterile gloves.  The skin over your port will be cleaned carefully with a germ-killing soap and allowed to dry.  Your health care provider will gently pinch the port and insert a needle into it.  Your health care provider will check for a blood return to make sure the port is in  the vein and is not clogged.  If your port needs to remain accessed to get medicine continuously (constant infusion), your health care provider will place a clear bandage (dressing) over the needle site. The dressing and needle will need to be changed every week, or as told by your health care provider. What is flushing? Flushing helps keep the port from getting clogged. Follow instructions from your health care provider about how and when to flush the port. Ports are usually flushed with saline solution or a medicine called heparin. The need for flushing will depend on how the port is used:  If the port is only used from time to time to give medicines or draw blood, the port may need to be flushed: ? Before and after medicines have been given. ? Before and after blood has been drawn. ? As part of routine maintenance. Flushing may be recommended every 4-6 weeks.  If a constant infusion is running, the port may not need to be flushed.  Throw away any syringes in a disposal container that is meant for sharp  items (sharps container). You can buy a sharps container from a pharmacy, or you can make one by using an empty hard plastic bottle with a cover. How long will my port stay implanted? The port can stay in for as long as your health care provider thinks it is needed. When it is time for the port to come out, a surgery will be done to remove it. The surgery will be similar to the procedure that was done to put the port in. Follow these instructions at home:   Flush your port as told by your health care provider.  If you need an infusion over several days, follow instructions from your health care provider about how to take care of your port site. Make sure you: ? Wash your hands with soap and water before you change your dressing. If soap and water are not available, use alcohol-based hand sanitizer. ? Change your dressing as told by your health care provider. ? Place any used dressings or  infusion bags into a plastic bag. Throw that bag in the trash. ? Keep the dressing that covers the needle clean and dry. Do not get it wet. ? Do not use scissors or sharp objects near the tube. ? Keep the tube clamped, unless it is being used.  Check your port site every day for signs of infection. Check for: ? Redness, swelling, or pain. ? Fluid or blood. ? Pus or a bad smell.  Protect the skin around the port site. ? Avoid wearing bra straps that rub or irritate the site. ? Protect the skin around your port from seat belts. Place a soft pad over your chest if needed.  Bathe or shower as told by your health care provider. The site may get wet as long as you are not actively receiving an infusion.  Return to your normal activities as told by your health care provider. Ask your health care provider what activities are safe for you.  Carry a medical alert card or wear a medical alert bracelet at all times. This will let health care providers know that you have an implanted port in case of an emergency. Get help right away if:  You have redness, swelling, or pain at the port site.  You have fluid or blood coming from your port site.  You have pus or a bad smell coming from the port site.  You have a fever. Summary  Implanted ports are usually placed in the chest for long-term IV access.  Follow instructions from your health care provider about flushing the port and changing bandages (dressings).  Take care of the area around your port by avoiding clothing that puts pressure on the area, and by watching for signs of infection.  Protect the skin around your port from seat belts. Place a soft pad over your chest if needed.  Get help right away if you have a fever or you have redness, swelling, pain, drainage, or a bad smell at the port site. This information is not intended to replace advice given to you by your health care provider. Make sure you discuss any questions you have with  your health care provider. Document Revised: 11/07/2018 Document Reviewed: 08/18/2016 Elsevier Patient Education  2020 [employer].

## 2020-04-25 NOTE — Op Note (Signed)
      [facility]       Operative Note  Date: 04/25/2020  Preoperative diagnosis:  1. lymphoma  Postoperative diagnosis:  Same as above  Procedures: #1. Ultrasound guidance for vascular access to the right internal jugular vein. #2. Fluoroscopic guidance for placement of catheter. #3. Placement of CT compatible Port-A-Cath, right internal jugular vein.  Surgeon: Leotis Pain, MD.   Anesthesia: Local with moderate conscious sedation for approximately 25  minutes using 3 mg of Versed and 50 mcg of Fentanyl  Fluoroscopy time: less than 1 minute  Contrast used: 0  Estimated blood loss: 3 cc  Indication for the procedure:  The patient is a 74 y.o.male with lymphoma.  The patient needs a Port-A-Cath for durable venous access, chemotherapy, lab draws, and CT scans. We are asked to place this. Risks and benefits were discussed and informed consent was obtained.  Description of procedure: The patient was brought to the vascular and interventional radiology suite.  Moderate conscious sedation was administered throughout the procedure during a face to face encounter with the patient with my supervision of the RN administering medicines and monitoring the patient's vital signs, pulse oximetry, telemetry and mental status throughout from the start of the procedure until the patient was taken to the recovery room. The right neck chest and shoulder were sterilely prepped and draped, and a sterile surgical field was created. Ultrasound was used to help visualize a patent right internal jugular vein. This was then accessed under direct ultrasound guidance without difficulty with the Seldinger needle and a permanent image was recorded. A J-wire was placed. After skin nick and dilatation, the peel-away sheath was then placed over the wire. I then anesthetized an area under the clavicle approximately 1-2 fingerbreadths. A transverse incision was created and an inferior pocket was created  with electrocautery and blunt dissection. The port was then brought onto the field, placed into the pocket and secured to the chest wall with 2 Prolene sutures. The catheter was connected to the port and tunneled from the subclavicular incision to the access site. Fluoroscopic guidance was then used to cut the catheter to an appropriate length. The catheter was then placed through the peel-away sheath and the peel-away sheath was removed. The catheter tip was parked in excellent location under fluorocoscopic guidance in the cavoatrial junction. The pocket was then irrigated with antibiotic impregnated saline and the wound was closed with a running 3-0 Vicryl and a 4-0 Monocryl. The access incision was closed with a single 4-0 Monocryl. The Huber needle was used to withdraw blood and flush the port with heparinized saline. Dermabond was then placed as a dressing. The patient tolerated the procedure well and was taken to the recovery room in stable condition.   Leotis Pain 04/25/2020 12:39 PM   This note was created with Dragon Medical transcription system. Any errors in dictation are purely unintentional.

## 2020-04-26 ENCOUNTER — Encounter
Admission: RE | Admit: 2020-04-26 | Discharge: 2020-04-26 | Disposition: A | Discharge status: Home / Self Care | Payer: Medicare HMO | Source: Ambulatory Visit | Attending: Oncology | Admitting: Oncology

## 2020-04-26 ENCOUNTER — Inpatient Hospital Stay: Payer: Medicare HMO

## 2020-04-26 DIAGNOSIS — N6332 Unspecified lump in axillary tail of the left breast: Secondary | ICD-10-CM | POA: Diagnosis not present

## 2020-04-26 DIAGNOSIS — R519 Headache, unspecified: Secondary | ICD-10-CM | POA: Diagnosis not present

## 2020-04-26 DIAGNOSIS — C8510 Unspecified B-cell lymphoma, unspecified site: Secondary | ICD-10-CM

## 2020-04-26 DIAGNOSIS — Z0189 Encounter for other specified special examinations: Secondary | ICD-10-CM | POA: Diagnosis not present

## 2020-04-26 DIAGNOSIS — Z01818 Encounter for other preprocedural examination: Secondary | ICD-10-CM | POA: Diagnosis not present

## 2020-04-26 DIAGNOSIS — A6920 Lyme disease, unspecified: Secondary | ICD-10-CM | POA: Diagnosis not present

## 2020-04-26 DIAGNOSIS — D649 Anemia, unspecified: Secondary | ICD-10-CM | POA: Diagnosis not present

## 2020-04-26 DIAGNOSIS — R590 Localized enlarged lymph nodes: Secondary | ICD-10-CM | POA: Diagnosis not present

## 2020-04-26 DIAGNOSIS — R634 Abnormal weight loss: Secondary | ICD-10-CM | POA: Diagnosis not present

## 2020-04-26 DIAGNOSIS — C8514 Unspecified B-cell lymphoma, lymph nodes of axilla and upper limb: Secondary | ICD-10-CM | POA: Diagnosis not present

## 2020-04-26 MED ORDER — TECHNETIUM TC 99M-LABELED RED BLOOD CELLS IV KIT: 20.0000 | PACK | Freq: Once | INTRAVENOUS | Status: DC | PRN

## 2020-04-27 LAB — HIV ANTIBODY (ROUTINE TESTING W REFLEX): HIV Screen 4th Generation wRfx: REACTIVE — AB

## 2020-04-28 ENCOUNTER — Encounter: Payer: Self-pay | Admitting: Oncology

## 2020-04-28 LAB — HIV-1/2 AB - DIFFERENTIATION
HIV 1 Ab: POSITIVE — AB
HIV 2 Ab: UNDETERMINED — AB

## 2020-04-29 ENCOUNTER — Other Ambulatory Visit: Payer: Self-pay | Admitting: Student

## 2020-04-29 ENCOUNTER — Telehealth: Payer: Self-pay | Admitting: *Deleted

## 2020-04-29 ENCOUNTER — Other Ambulatory Visit: Payer: Self-pay

## 2020-04-29 DIAGNOSIS — Z21 Asymptomatic human immunodeficiency virus [HIV] infection status: Secondary | ICD-10-CM

## 2020-04-29 NOTE — Telephone Encounter (Signed)
-----   Message from Golden Circle, Davis sent at 04/28/2020  5:15 PM EDT ----- Regarding: Positive HIV Test Hello Dr. Tasia Catchings. 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 Please let us know if we can be of any assistance or if you have any questions. We would be happy to have him get connected in care at [facility].  Thanks for all you do. Terri Piedra, NP

## 2020-04-29 NOTE — Telephone Encounter (Signed)
Reached out to patient  Justin Wallace spoke with him he will contact Dr Tasia Catchings and then contact us back

## 2020-04-29 NOTE — Telephone Encounter (Signed)
Faxed patient information to [facility], attention Gwenlyn Perking. Will connect patient to [facility] for scheduling new B20 appointment. [name], RN

## 2020-04-29 NOTE — Progress Notes (Signed)
Patient on schedule for BMB 05/02/2020, spoke with patient on phone with pre procedure instructions given. Made aware to be here @ 0730, NPO after MN as well as driver for discharge after procedure/recovery.

## 2020-05-02 ENCOUNTER — Ambulatory Visit
Admission: RE | Admit: 2020-05-02 | Discharge: 2020-05-02 | Disposition: A | Discharge status: Home / Self Care | Payer: Medicare HMO | Source: Ambulatory Visit | Attending: Oncology | Admitting: Oncology

## 2020-05-02 ENCOUNTER — Other Ambulatory Visit: Payer: Self-pay

## 2020-05-02 ENCOUNTER — Telehealth: Payer: Self-pay

## 2020-05-02 DIAGNOSIS — D72819 Decreased white blood cell count, unspecified: Secondary | ICD-10-CM | POA: Diagnosis not present

## 2020-05-02 DIAGNOSIS — C8330 Diffuse large B-cell lymphoma, unspecified site: Secondary | ICD-10-CM | POA: Diagnosis not present

## 2020-05-02 DIAGNOSIS — C8590 Non-Hodgkin lymphoma, unspecified, unspecified site: Secondary | ICD-10-CM | POA: Diagnosis not present

## 2020-05-02 DIAGNOSIS — Z79899 Other long term (current) drug therapy: Secondary | ICD-10-CM | POA: Insufficient documentation

## 2020-05-02 DIAGNOSIS — K900 Celiac disease: Secondary | ICD-10-CM | POA: Diagnosis not present

## 2020-05-02 DIAGNOSIS — D72822 Plasmacytosis: Secondary | ICD-10-CM | POA: Diagnosis not present

## 2020-05-02 DIAGNOSIS — C8510 Unspecified B-cell lymphoma, unspecified site: Secondary | ICD-10-CM | POA: Insufficient documentation

## 2020-05-02 LAB — CBC WITH DIFFERENTIAL/PLATELET
Abs Immature Granulocytes: 0.04 10*3/uL (ref 0.00–0.07)
Basophils Absolute: 0 10*3/uL (ref 0.0–0.1)
Basophils Relative: 1 %
Eosinophils Absolute: 0.1 10*3/uL (ref 0.0–0.5)
Eosinophils Relative: 4 %
HCT: 36.4 % — ABNORMAL LOW (ref 39.0–52.0)
Hemoglobin: 12.3 g/dL — ABNORMAL LOW (ref 13.0–17.0)
Immature Granulocytes: 1 %
Lymphocytes Relative: 34 %
Lymphs Abs: 1.1 10*3/uL (ref 0.7–4.0)
MCH: 28.7 pg (ref 26.0–34.0)
MCHC: 33.8 g/dL (ref 30.0–36.0)
MCV: 85 fL (ref 80.0–100.0)
Monocytes Absolute: 0.6 10*3/uL (ref 0.1–1.0)
Monocytes Relative: 17 %
Neutro Abs: 1.5 10*3/uL — ABNORMAL LOW (ref 1.7–7.7)
Neutrophils Relative %: 43 %
Platelets: 184 10*3/uL (ref 150–400)
RBC: 4.28 MIL/uL (ref 4.22–5.81)
RDW: 15.7 % — ABNORMAL HIGH (ref 11.5–15.5)
Smear Review: NORMAL
WBC: 3.4 10*3/uL — ABNORMAL LOW (ref 4.0–10.5)
nRBC: 0 % (ref 0.0–0.2)

## 2020-05-02 MED ORDER — HEPARIN SOD (PORK) LOCK FLUSH 100 UNIT/ML IV SOLN
INTRAVENOUS | Status: AC
  Filled 2020-05-02: qty 5

## 2020-05-02 MED ORDER — FENTANYL CITRATE (PF) 100 MCG/2ML IJ SOLN
INTRAMUSCULAR | Status: AC
  Filled 2020-05-02: qty 2

## 2020-05-02 MED ORDER — MIDAZOLAM HCL 2 MG/2ML IJ SOLN
INTRAMUSCULAR | Status: AC | PRN
  Administered 2020-05-02 (×2): 1 mg via INTRAVENOUS

## 2020-05-02 MED ORDER — FENTANYL CITRATE (PF) 100 MCG/2ML IJ SOLN
INTRAMUSCULAR | Status: AC | PRN
  Administered 2020-05-02 (×2): 50 ug via INTRAVENOUS

## 2020-05-02 MED ORDER — MIDAZOLAM HCL 2 MG/2ML IJ SOLN
INTRAMUSCULAR | Status: AC
  Filled 2020-05-02: qty 2

## 2020-05-02 MED ORDER — SODIUM CHLORIDE 0.9 % IV SOLN: INTRAVENOUS | Status: DC

## 2020-05-02 NOTE — Telephone Encounter (Signed)
Called [facility] to confirm that they received referral. Spoke to Jeff who states that they spoke to patient before he knew about the diagnosis. Pt told them that he would call back after he spoke with Dr. Tasia Catchings.

## 2020-05-02 NOTE — Progress Notes (Signed)
Patient clinically stable post BMB per Dr Laurence Ferrari, tolerated well. Vitals stable pre and post procedure. Denies complaints at this time. Awake/alert and oriented post procedure. Report given to Fransico Michael Rn/post procedure with questions answered. Received Versed 2 mg along with Fentanyl 100 mcg IV for procedure.

## 2020-05-02 NOTE — Procedures (Signed)
Interventional Radiology Procedure Note  Procedure: CT guided aspirate and core biopsy of right iliac bone Complications: None Recommendations: - Bedrest supine x 1 hrs - Hydrocodone PRN  Pain - Follow biopsy results  Signed,  Criselda Peaches, MD

## 2020-05-02 NOTE — Telephone Encounter (Signed)
-----   Message from Evelina Dun, RN sent at 05/02/2020  3:42 PM EDT ----- Regarding: FW: Positive HIV Test  ----- Message ----- From: Evelina Dun, RN Sent: 04/29/2020   3:56 PM EDT To: Golden Circle, FNP, Bing Quarry, # Subject: RE: Positive HIV Test                          Just put in referral for [facility].   ----- Message ----- From: Golden Circle, FNP Sent: 04/29/2020   3:30 PM EDT To: Bing Quarry, Evelina Dun, RN, # Subject: RE: Positive HIV Test                          [facility]! If you will put in the referral we can take it from there.   Marya Amsler ----- Message ----- From: Earlie Server, MD Sent: 04/29/2020   1:00 PM EDT To: Golden Circle, FNP, Evelina Dun, RN, # Subject: Melton Alar: Positive HIV Test                          Discussed with his PCP and we both agree that he needs to set up care with [facility] clinic.  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  Let me know what we can do from oncology to facilitate that. I sent him a mychart message regarding HIV test results.  ----- Message ----- From: Earlie Server, MD Sent: 04/28/2020  11:12 PM EDT To: Golden Circle, FNP, [name], RN Subject: RE: Positive HIV Test                          Thanks. I just sent Dr.Karamalegos a message regarding this before seeing your message.  ----- Message ----- From: Golden Circle, FNP Sent: 04/28/2020   5:15 PM EDT To: [name], RN, Earlie Server, MD Subject: Positive HIV Test                              Hello Dr. Tasia Catchings. We received notification that Mr. 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 Please let us know if we can be of any assistance or if you have any questions. We would be happy to have him get connected in care at [facility].  Thanks for all you do. Terri Piedra, NP

## 2020-05-02 NOTE — H&P (Signed)
Chief Complaint: Patient was seen in consultation today for high grade B-cell lymphoma at the request of Yu,Zhou  Referring Physician(s): Yu,Zhou  Patient Status: [facility] - Out-pt  History of Present Illness: Justin [facility] is a 74 y.o. male who was recently diagnosed with high-grade B-cell lymphoma.  He presents today at the kind request of Dr. Tasia Catchings for possible CT guided bone marrow biopsy.  He is currently in his usual state of health and denies acute complaints.   Past Medical History:  Diagnosis Date  . Celiac disease   . Lyme disease   . Lymphoma [facility])     Past Surgical History:  Procedure Laterality Date  . PORTA CATH INSERTION N/A 04/25/2020   Procedure: PORTA CATH INSERTION;  Surgeon: Algernon Huxley, MD;  Location: [facility];  Service: Cardiovascular;  Laterality: N/A;    Allergies: Patient has no known allergies.  Medications: Prior to Admission medications   Medication Sig Start Date End Date Taking? Authorizing Provider  ferrous sulfate 325 (65 FE) MG EC tablet Take 325 mg by mouth daily with breakfast.    Yes [provider]  Multiple Vitamin (THERA) TABS Take 1 tablet by mouth daily.   Yes [provider]  doxycycline (VIBRA-TABS) 100 MG tablet Take 1 tablet (100 mg total) by mouth 2 (two) times daily. For 21 days. Take with full glass of water, stay upright 30 min after taking. Patient not taking: Reported on 05/02/2020 04/06/20   Olin Hauser, DO  tadalafil (CIALIS) 20 MG tablet May take 1/2 to 1 tablet as needed. Patient not taking: Reported on 05/02/2020 07/15/14   [provider]     Family History  Problem Relation Age of Onset  . Hyperlipidemia Mother   . Brain cancer Father     Social History   Socioeconomic History  . Marital status: Married    Spouse name: Opal Sidles   . Number of children: 2  . Years of education: Not on file  . Highest education level: Not on file  Occupational History  . Not on file   Tobacco Use  . Smoking status: Never Smoker  . Smokeless tobacco: Never Used  Vaping Use  . Vaping Use: Never used  Substance and Sexual Activity  . Alcohol use: Yes    Comment: occasional  . Drug use: Never  . Sexual activity: Never  Other Topics Concern  . Not on file  Social History Narrative  . Not on file   Social Determinants of Health   Financial Resource Strain:   . Difficulty of Paying Living Expenses: Not on file  Food Insecurity:   . Worried About Charity fundraiser in the Last Year: Not on file  . Ran Out of Food in the Last Year: Not on file  Transportation Needs:   . Lack of Transportation (Medical): Not on file  . Lack of Transportation (Non-Medical): Not on file  Physical Activity:   . Days of Exercise per Week: Not on file  . Minutes of Exercise per Session: Not on file  Stress:   . Feeling of Stress : Not on file  Social Connections:   . Frequency of Communication with Friends and Family: Not on file  . Frequency of Social Gatherings with Friends and Family: Not on file  . Attends Religious Services: Not on file  . Active Member of Clubs or Organizations: Not on file  . Attends Archivist Meetings: Not on file  . Marital Status:  Not on file    ECOG Status: 1 - Symptomatic but completely ambulatory  Review of Systems: A 12 point ROS discussed and pertinent positives are indicated in the HPI above.  All other systems are negative.  Review of Systems  Vital Signs: BP 104/72   Pulse 66   Temp 97.7 F (36.5 C) (Oral)   Resp 13   Ht 5' 9"  (1.753 m)   Wt 66.7 kg   SpO2 96%   BMI 21.71 kg/m   Physical Exam Constitutional:      General: He is not in acute distress.    Appearance: Normal appearance. He is not ill-appearing.  HENT:     Head: Normocephalic and atraumatic.  Eyes:     General: No scleral icterus. Cardiovascular:     Rate and Rhythm: Normal rate.  Pulmonary:     Effort: Pulmonary effort is normal.     Breath  sounds: Normal breath sounds.  Abdominal:     General: Abdomen is flat.     Palpations: Abdomen is soft.  Skin:    General: Skin is warm and dry.  Neurological:     Mental Status: He is alert and oriented to person, place, and time.  Psychiatric:        Mood and Affect: Mood normal.        Behavior: Behavior normal.     Imaging: NM Cardiac Muga Rest  Result Date: 04/27/2020 CLINICAL DATA:  High-grade B-cell lymphoma, pre chemotherapy assessment EXAM: NUCLEAR MEDICINE CARDIAC BLOOD POOL IMAGING (MUGA) TECHNIQUE: Cardiac multi-gated acquisition was performed at rest following intravenous injection of Tc-9mlabeled red blood cells. RADIOPHARMACEUTICALS:  22.01 mCi Tc-948mertechnetate in-vitro labeled red blood cells IV COMPARISON:  None FINDINGS: Calculated LEFT ventricular ejection fraction is 56%, normal. Study was obtained at a cardiac rate of 62 bpm. Patient was rhythmic during imaging. Cine analysis of the LEFT ventricle in 3 projections demonstrates normal LV wall motion. IMPRESSION: Normal LEFT ventricular ejection fraction of 56% with normal LV wall motion. Electronically Signed   By: MaLavonia Dana.D.   On: 04/27/2020 11:42   PERIPHERAL VASCULAR CATHETERIZATION  Result Date: 04/25/2020 See op note  NM PET Image Initial (PI) Whole Body  Result Date: 04/12/2020 CLINICAL DATA:  Initial treatment strategy for LEFT axillary mass. EXAM: NUCLEAR MEDICINE PET WHOLE BODY TECHNIQUE: 7.8 mCi F-18 FDG was injected intravenously. Full-ring PET imaging was performed from the head to foot after the radiotracer. CT data was obtained and used for attenuation correction and anatomic localization. Fasting blood glucose: 85 mg/dl COMPARISON:  None. FINDINGS: Mediastinal blood pool activity: SUV max 2.3 HEAD/NECK: No hypermetabolic activity in the scalp. No hypermetabolic cervical lymph nodes. Incidental CT findings: none CHEST: Large LEFT axillary mass measures 2.7 by 5.0 cm short axis with intense  metabolic activity (SUV max equal 28) Several smaller LEFT axillary nodes more superiorly are not pathologic by size criteria and have minimal metabolic activity (SUV max 2.0) Similar small RIGHT axillary lymph nodes are normal morphology with low metabolic activity (SUV max equal 2.7 (image 472.) Incidental CT findings: No suspicious pulmonary nodules. ABDOMEN/PELVIS: There is moderate activity in the porta hepatis region with SUV max equal 4.7. No discrete enlarged nodes are present. No abnormal activity liver. The spleen is normal size and normal metabolic activity. No periaortic or iliac adenopathy. Pelvic adenopathy or inguinal adenopathy. Incidental CT findings: none SKELETON: No focal hypermetabolic activity to suggest skeletal metastasis. Incidental CT findings: No lytic lesion. EXTREMITIES: No abnormal hypermetabolic  activity in the lower extremities. Incidental CT findings: No lytic or sclerotic lesion IMPRESSION: 1. Large intensely hypermetabolic LEFT axillary mass with differential including solitary massive lymph node versus soft tissue plasmacytoma. 2. Additional small bilateral axial lymph nodes with mild metabolic activity. Favor reactive nodes. 3. Metabolic activity through the porta hepatis region without enlarged nodes identified. Favor reactive activity or benign GI activity. 4. Normal spleen and bone marrow. 5. No lytic or hypermetabolic skeletal lesion. Electronically Signed   By: Suzy Bouchard M.D.   On: 04/12/2020 14:40   [place] CORE BIOPSY (LYMPH NODES)  Result Date: 04/13/2020 INDICATION: No known primary, now with hypermetabolic left axillary nodal mass. Please perform ultrasound-guided biopsy for tissue diagnostic purposes. EXAM: ULTRASOUND-GUIDED LEFT AXILLARY LYMPH NODE BIOPSY COMPARISON:  PET-CT-04/12/2020; chest CT-03/30/2020 MEDICATIONS: None ANESTHESIA/SEDATION: None COMPLICATIONS: None immediate. TECHNIQUE: Informed written consent was obtained from the patient after a  discussion of the risks, benefits and alternatives to treatment. Questions regarding the procedure were encouraged and answered. Initial ultrasound scanning demonstrated a large, at least 6.7 x 2.7 cm left axillary nodal conglomeration compatible with the findings seen on preceding PET-CT. An ultrasound image was saved for documentation purposes. The procedure was planned. A timeout was performed prior to the initiation of the procedure. The operative was prepped and draped in the usual sterile fashion, and a sterile drape was applied covering the operative field. A timeout was performed prior to the initiation of the procedure. Local anesthesia was provided with 1% lidocaine with epinephrine. Under direct ultrasound guidance, an 18 gauge core needle device was utilized to obtain to obtain 7 core needle biopsies of the hypermetabolic left axillary lymph node. The samples were placed in saline and submitted to pathology. The needle was removed and hemostasis was achieved with manual compression. Post procedure scan was negative for significant hematoma. A dressing was placed. The patient tolerated the procedure well without immediate postprocedural complication. IMPRESSION: Technically successful ultrasound guided biopsy of dominant hypermetabolic left axillary lymph node. Electronically Signed   By: Sandi Mariscal M.D.   On: 04/13/2020 15:49    Labs:  CBC: Recent Labs    03/10/20 1113 03/17/20 1029 05/02/20 0741  WBC 4.9 6.8 3.4*  HGB 13.1* 13.0 12.3*  HCT 40.9 38.2* 36.4*  PLT 349 337 184    COAGS: No results for input(s): INR, APTT in the last 8760 hours.  BMP: Recent Labs    03/10/20 1113 03/17/20 1029  NA 138 137  K 4.8 3.8  CL 106 104  CO2 27 27  GLUCOSE 94 97  BUN 23 21  CALCIUM 8.4* 8.1*  CREATININE 0.91 1.09  GFRNONAA 84 >60  GFRAA 98 >60    LIVER FUNCTION TESTS: Recent Labs    03/10/20 1113 03/17/20 1029  BILITOT 0.4 0.3  AST 20 24  ALT 13 18  ALKPHOS  --  140*   PROT 6.8 8.0  ALBUMIN  --  2.7*    TUMOR MARKERS: No results for input(s): AFPTM, CEA, CA199, CHROMGRNA in the last 8760 hours.  Assessment and Plan:  Recently diagnosed high-grade B-cell lymphoma.  This gentleman needs a bone marrow biopsy to assess for marrow involvement for staging.   1.) CT guided bone marrow biopsy  Risks and benefits of bone marrow biopsy were discussed with the patient and/or patient's family including, but not limited to bleeding, infection, damage to adjacent structures or low yield requiring additional tests.  All of the questions were answered and there is agreement to proceed.  Consent signed and in chart.    Thank you for this interesting consult.  I greatly enjoyed meeting Justin [facility] and look forward to participating in their care.  A copy of this report was sent to the requesting provider on this date.  Electronically Signed: Jacqulynn Cadet, MD 05/02/2020, 9:18 AM   I spent a total of 15 Minutes in face to face in clinical consultation, greater than 50% of which was counseling/coordinating care for high grade B-Cell lymphoma.

## 2020-05-03 ENCOUNTER — Telehealth: Payer: Self-pay | Admitting: Oncology

## 2020-05-03 NOTE — Telephone Encounter (Signed)
I called patient and updated him that attempts of fish test failed and currently pathology not able to further narrow down his histology.  We also discussed about positive HIV test.  Patient has been referred to ID clinic and he has an appointment tomorrow. Patient also informs me that he has received a call from the state and he understands that his wife needs to be notified about his HIV status and needs to be tested.  He plans to further discuss with ID physician tomorrow.  He prefers to keep his HIV status confidential to other family members.

## 2020-05-03 NOTE — Telephone Encounter (Signed)
Dr. Tasia Catchings has spoken with patient. Patient to follow up with [facility].

## 2020-05-04 LAB — SURGICAL PATHOLOGY

## 2020-05-04 NOTE — Telephone Encounter (Signed)
Patient has scheduled appointments at [facility], connected with Almyra Free at [facility].

## 2020-05-05 ENCOUNTER — Other Ambulatory Visit: Payer: Self-pay

## 2020-05-05 ENCOUNTER — Other Ambulatory Visit: Payer: Medicare HMO

## 2020-05-05 ENCOUNTER — Ambulatory Visit: Payer: Medicare HMO | Admitting: *Deleted

## 2020-05-05 ENCOUNTER — Ambulatory Visit: Payer: Medicare HMO

## 2020-05-05 ENCOUNTER — Other Ambulatory Visit (HOSPITAL_COMMUNITY)
Admission: RE | Admit: 2020-05-05 | Discharge: 2020-05-05 | Disposition: A | Discharge status: Home / Self Care | Payer: Medicare HMO | Source: Ambulatory Visit | Attending: Infectious Diseases | Admitting: Infectious Diseases

## 2020-05-05 ENCOUNTER — Encounter: Payer: Self-pay | Admitting: Infectious Diseases

## 2020-05-05 ENCOUNTER — Telehealth: Payer: Self-pay

## 2020-05-05 DIAGNOSIS — C8510 Unspecified B-cell lymphoma, unspecified site: Secondary | ICD-10-CM

## 2020-05-05 DIAGNOSIS — Z113 Encounter for screening for infections with a predominantly sexual mode of transmission: Secondary | ICD-10-CM

## 2020-05-05 DIAGNOSIS — Z79899 Other long term (current) drug therapy: Secondary | ICD-10-CM

## 2020-05-05 DIAGNOSIS — B20 Human immunodeficiency virus [HIV] disease: Secondary | ICD-10-CM

## 2020-05-05 NOTE — Telephone Encounter (Signed)
Rcceived notification that patient had follow up at [facility] today and will follow up with Dr. Alphonsa Gin in 2 weeks.  Patient has been informed that final pathology results are still pending and chemotherapy regimen will be based upon those results. Meanwhile, he will need baseline echocardiogram.

## 2020-05-05 NOTE — Progress Notes (Signed)
Patient here for intake financial counseling and labs.  RN met with Justin [facility] before his labs today.  Introduced our clinic, support available.  He has spoken with Kristine Garbe ([place]) and Almyra Free [facility] Providers).  He will be following with Dr West Bali in 2 weeks to review lab results and to start treatment.  Some of his labs will take longer to result than others, but [name] knows we'll reach out if any of his results are troublesome or if he needs to start medication.   Cassie, our pharmacist, is available to talk about PrEP. [name], RN

## 2020-05-06 LAB — T-HELPER CELL (CD4) - (RCID CLINIC ONLY)
CD4 % Helper T Cell: 14 % — ABNORMAL LOW (ref 33–65)
CD4 T Cell Abs: 64 /uL — ABNORMAL LOW (ref 400–1790)

## 2020-05-06 LAB — URINE CYTOLOGY ANCILLARY ONLY
Chlamydia: NEGATIVE
Comment: NEGATIVE
Comment: NORMAL
Neisseria Gonorrhea: NEGATIVE

## 2020-05-06 NOTE — Telephone Encounter (Signed)
05/06/2020  Called pt and confirmed day and time for echocardiogram  srw

## 2020-05-10 ENCOUNTER — Encounter (HOSPITAL_COMMUNITY): Payer: Self-pay | Admitting: Oncology

## 2020-05-13 ENCOUNTER — Telehealth: Payer: Self-pay

## 2020-05-13 ENCOUNTER — Ambulatory Visit
Admission: RE | Admit: 2020-05-13 | Discharge: 2020-05-13 | Disposition: A | Discharge status: Home / Self Care | Payer: Medicare HMO | Source: Ambulatory Visit | Attending: Oncology | Admitting: Oncology

## 2020-05-13 ENCOUNTER — Telehealth: Payer: Self-pay | Admitting: *Deleted

## 2020-05-13 ENCOUNTER — Other Ambulatory Visit: Payer: Self-pay

## 2020-05-13 DIAGNOSIS — I080 Rheumatic disorders of both mitral and aortic valves: Secondary | ICD-10-CM | POA: Diagnosis not present

## 2020-05-13 DIAGNOSIS — C8510 Unspecified B-cell lymphoma, unspecified site: Secondary | ICD-10-CM

## 2020-05-13 DIAGNOSIS — B20 Human immunodeficiency virus [HIV] disease: Secondary | ICD-10-CM

## 2020-05-13 DIAGNOSIS — Z5111 Encounter for antineoplastic chemotherapy: Secondary | ICD-10-CM | POA: Diagnosis not present

## 2020-05-13 LAB — ECHOCARDIOGRAM LIMITED
AR max vel: 2.03 cm2
AV Area VTI: 2.56 cm2
AV Area mean vel: 2.01 cm2
AV Mean grad: 3 mmHg
AV Peak grad: 5.1 mmHg
Ao pk vel: 1.13 m/s
Area-P 1/2: 3.93 cm2
P 1/2 time: 499 msec
S' Lateral: 3.55 cm

## 2020-05-13 MED ORDER — SULFAMETHOXAZOLE-TRIMETHOPRIM 800-160 MG PO TABS: 1.0000 | ORAL_TABLET | Freq: Every day | ORAL | 3 refills | Status: DC

## 2020-05-13 NOTE — Progress Notes (Signed)
*  PRELIMINARY RESULTS* Echocardiogram 2D Echocardiogram has been performed.  Sherrie Sport 05/13/2020, 10:25 AM

## 2020-05-13 NOTE — Telephone Encounter (Signed)
Per verbal order from Dr Juleen China, RN sent in bactrim ds one tablet daily to patient's pharmacy - [facility] at [address].  RN left voicemail for Jarnell letting him know that the prescription had been sent as we had discussed. Reminded him of upcoming appointment with Dr Juleen China 10/20 an asked him to call back with any questions. [name], RN

## 2020-05-13 NOTE — Telephone Encounter (Signed)
[facility] Patient Teacher, English as a foreign language completed.    The patient is insured through [employer].  Insurance will pat for [place] or [place] with a PA.     will continue to follow to see if copay assistance is needed.  Ileene Patrick, [facility] Patient [facility] for Infectious Disease Phone: [phone] Fax:  [phone]

## 2020-05-13 NOTE — Telephone Encounter (Signed)
Called patient to inform him that Dr. Tasia Catchings is still watiing on additional testing results to decide on a tx regimen. Pt voiced understanding. Pt reports that he is feeling well.

## 2020-05-13 NOTE — Telephone Encounter (Signed)
Patient is calling to see if his "medication" was called to the pharmacy. He states Sharyn Lull, RN told him it would be at the pharmacy and he has not received a call. Patient was advised after his office visit with the provider a medication will be prescribed.  He is a new patient who was here for labs prior and met with Sharyn Lull since he was a new B-20.   Laverle Patter, RN

## 2020-05-16 ENCOUNTER — Telehealth: Payer: Self-pay | Admitting: Gastroenterology

## 2020-05-16 NOTE — Telephone Encounter (Signed)
Patient called is feeling better, appt is no longer needed scheduled on 05/18/2020.

## 2020-05-17 LAB — COMPLETE METABOLIC PANEL WITH GFR
AG Ratio: 0.9 (calc) — ABNORMAL LOW (ref 1.0–2.5)
ALT: 8 U/L — ABNORMAL LOW (ref 9–46)
AST: 15 U/L (ref 10–35)
Albumin: 3.5 g/dL — ABNORMAL LOW (ref 3.6–5.1)
Alkaline phosphatase (APISO): 96 U/L (ref 35–144)
BUN: 19 mg/dL (ref 7–25)
CO2: 27 mmol/L (ref 20–32)
Calcium: 9.1 mg/dL (ref 8.6–10.3)
Chloride: 107 mmol/L (ref 98–110)
Creat: 0.88 mg/dL (ref 0.70–1.18)
GFR, Est African American: 100 mL/min/{1.73_m2} (ref >=60)
GFR, Est Non African American: 86 mL/min/{1.73_m2} (ref >=60)
Globulin: 3.9 g/dL (calc) — ABNORMAL HIGH (ref 1.9–3.7)
Glucose, Bld: 97 mg/dL (ref 65–99)
Potassium: 4.4 mmol/L (ref 3.5–5.3)
Sodium: 139 mmol/L (ref 135–146)
Total Bilirubin: 0.5 mg/dL (ref 0.2–1.2)
Total Protein: 7.4 g/dL (ref 6.1–8.1)

## 2020-05-17 LAB — CBC WITH DIFFERENTIAL/PLATELET
Absolute Monocytes: 408 cells/uL (ref 200–950)
Basophils Absolute: 30 cells/uL (ref 0–200)
Basophils Relative: 1 %
Eosinophils Absolute: 39 cells/uL (ref 15–500)
Eosinophils Relative: 1.3 %
HCT: 38.6 % (ref 38.5–50.0)
Hemoglobin: 12.4 g/dL — ABNORMAL LOW (ref 13.2–17.1)
Lymphs Abs: 519 cells/uL — ABNORMAL LOW (ref 850–3900)
MCH: 28 pg (ref 27.0–33.0)
MCHC: 32.1 g/dL (ref 32.0–36.0)
MCV: 87.1 fL (ref 80.0–100.0)
MPV: 10.5 fL (ref 7.5–12.5)
Monocytes Relative: 13.6 %
Neutro Abs: 2004 cells/uL (ref 1500–7800)
Neutrophils Relative %: 66.8 %
Platelets: 211 10*3/uL (ref 140–400)
RBC: 4.43 10*6/uL (ref 4.20–5.80)
RDW: 14.9 % (ref 11.0–15.0)
Total Lymphocyte: 17.3 %
WBC: 3 10*3/uL — ABNORMAL LOW (ref 3.8–10.8)

## 2020-05-17 LAB — HEPATITIS B SURFACE ANTIGEN: Hepatitis B Surface Ag: NONREACTIVE

## 2020-05-17 LAB — HEPATITIS C ANTIBODY
Hepatitis C Ab: NONREACTIVE
SIGNAL TO CUT-OFF: 0.29 (ref <1.00)

## 2020-05-17 LAB — URINALYSIS
Bilirubin Urine: NEGATIVE
Glucose, UA: NEGATIVE
Hgb urine dipstick: NEGATIVE
Leukocytes,Ua: NEGATIVE
Nitrite: NEGATIVE
Specific Gravity, Urine: 1.023 (ref 1.001–1.03)
pH: 6 (ref 5.0–8.0)

## 2020-05-17 LAB — LIPID PANEL
Cholesterol: 158 mg/dL (ref <200)
HDL: 34 mg/dL — ABNORMAL LOW (ref >=40)
LDL Cholesterol (Calc): 105 mg/dL (calc) — ABNORMAL HIGH
Non-HDL Cholesterol (Calc): 124 mg/dL (calc) (ref <130)
Total CHOL/HDL Ratio: 4.6 (calc) (ref <5.0)
Triglycerides: 92 mg/dL (ref <150)

## 2020-05-17 LAB — QUANTIFERON-TB GOLD PLUS
Mitogen-NIL: 1.82 IU/mL
NIL: 0.03 IU/mL
QuantiFERON-TB Gold Plus: NEGATIVE
TB1-NIL: 0 IU/mL
TB2-NIL: 0 IU/mL

## 2020-05-17 LAB — HEPATITIS A ANTIBODY, TOTAL: Hepatitis A AB,Total: REACTIVE — AB

## 2020-05-17 LAB — RPR: RPR Ser Ql: NONREACTIVE

## 2020-05-17 LAB — HIV-1 GENOTYPE: HIV-1 Genotype: DETECTED — AB

## 2020-05-17 LAB — HEPATITIS B CORE ANTIBODY, TOTAL: Hep B Core Total Ab: NONREACTIVE

## 2020-05-17 LAB — HIV-1 RNA ULTRAQUANT REFLEX TO GENTYP+
HIV 1 RNA Quant: 642000 copies/mL — ABNORMAL HIGH
HIV-1 RNA Quant, Log: 5.81 Log copies/mL — ABNORMAL HIGH

## 2020-05-17 LAB — HEPATITIS B SURFACE ANTIBODY,QUALITATIVE: Hep B S Ab: NONREACTIVE

## 2020-05-17 LAB — HLA B*5701: HLA-B*5701 w/rflx HLA-B High: NEGATIVE

## 2020-05-18 ENCOUNTER — Ambulatory Visit: Payer: Medicare HMO | Admitting: Internal Medicine

## 2020-05-18 ENCOUNTER — Ambulatory Visit: Payer: Medicare HMO | Admitting: Gastroenterology

## 2020-05-18 ENCOUNTER — Telehealth: Payer: Self-pay

## 2020-05-18 ENCOUNTER — Other Ambulatory Visit: Payer: Self-pay | Admitting: Internal Medicine

## 2020-05-18 ENCOUNTER — Other Ambulatory Visit: Payer: Self-pay

## 2020-05-18 ENCOUNTER — Encounter: Payer: Self-pay | Admitting: Internal Medicine

## 2020-05-18 DIAGNOSIS — B20 Human immunodeficiency virus [HIV] disease: Secondary | ICD-10-CM | POA: Diagnosis not present

## 2020-05-18 DIAGNOSIS — C8510 Unspecified B-cell lymphoma, unspecified site: Secondary | ICD-10-CM | POA: Diagnosis not present

## 2020-05-18 DIAGNOSIS — Z7185 Encounter for immunization safety counseling: Secondary | ICD-10-CM | POA: Diagnosis not present

## 2020-05-18 DIAGNOSIS — C8590 Non-Hodgkin lymphoma, unspecified, unspecified site: Secondary | ICD-10-CM | POA: Insufficient documentation

## 2020-05-18 DIAGNOSIS — Z0000 Encounter for general adult medical examination without abnormal findings: Secondary | ICD-10-CM | POA: Diagnosis not present

## 2020-05-18 DIAGNOSIS — Z298 Encounter for other specified prophylactic measures: Secondary | ICD-10-CM | POA: Insufficient documentation

## 2020-05-18 MED ORDER — BICTEGRAVIR-EMTRICITAB-TENOFOV 50-200-25 MG PO TABS: 1.0000 | ORAL_TABLET | Freq: Every day | ORAL | 5 refills | Status: DC

## 2020-05-18 NOTE — Telephone Encounter (Signed)
[facility] Patient Advocate Encounter   I was successful in securing patient a $7500.00 grant from [employer] ([employer]) to provide copayment coverage for Biktarvy.  This will make the out of pocket cost $0.00.     I have spoken with the patient.    The billing information is as follows and has been shared with [facility].        Patient knows to call the office with questions or concerns.  Kiwana Deblasi, CPhT Specialty Pharmacy Patient Advocate [facility] for Infectious Disease Phone: [phone] Fax:  [phone]  

## 2020-05-18 NOTE — Assessment & Plan Note (Signed)
Recently diagnosed and following with Dr Tasia Catchings for oncology care. Hoping to start chemotherapy soon.

## 2020-05-18 NOTE — Progress Notes (Addendum)
[place] for Infectious Disease  Reason for Consult: HIV new patient Referring Provider: Dr Justin [facility]  HPI:  Justin [facility] is a 74 y.o. male with a past medical history as below who presents to clinic as a new patient for HIV care.  This was in the setting of a new diagnosis of high grade B-cell lymphoma.  He is unsure of when/how he acquired HIV.  He reports only being sexually active with women in the past.  Has not been sexually active in several years.  No hx of injection drugs or needle sticks.   HIV diagnosed: 03/2020 CD4 at diagnosis: 64 (14%) on 05/05/20 VL at diagnosis: 642,000 on 05/05/20 Recent CD4: as above Recent viral load: as above Prior ART: treatment naive Current ART: none Quantiferon: negative HLA-[id number]: negative Hx of OI: None Hx of STI: None   Genotype Hx: 05/05/2020: No resistance mutations predicted   Patient's Medications  New Prescriptions   BICTEGRAVIR-EMTRICITABINE-TENOFOVIR AF (BIKTARVY) 50-200-25 MG TABS TABLET    Take 1 tablet by mouth daily.  Previous Medications   FERROUS SULFATE 325 (65 FE) MG EC TABLET    Take 325 mg by mouth daily with breakfast.    MULTIPLE VITAMIN (THERA) TABS    Take 1 tablet by mouth daily.   SULFAMETHOXAZOLE-TRIMETHOPRIM (BACTRIM DS) 800-160 MG TABLET    Take 1 tablet by mouth daily.  Modified Medications   No medications on file  Discontinued Medications   DOXYCYCLINE (VIBRA-TABS) 100 MG TABLET    Take 1 tablet (100 mg total) by mouth 2 (two) times daily. For 21 days. Take with full glass of water, stay upright 30 min after taking.   TADALAFIL (CIALIS) 20 MG TABLET    May take 1/2 to 1 tablet as needed.      Past Medical History:  Diagnosis Date  . Celiac disease   . HIV (human immunodeficiency virus infection) ([place])   . Lyme disease   . Lymphoma ([place])     Social History   Tobacco Use  . Smoking status: Never Smoker  . Smokeless tobacco: Never Used  Vaping Use  . Vaping Use: Never used    Substance Use Topics  . Alcohol use: Yes    Comment: occasional  . Drug use: Never    Family History  Problem Relation Age of Onset  . Hyperlipidemia Mother   . Brain cancer Father     No Known Allergies  Review of Systems: Review of Systems  Constitutional: Positive for weight loss. Negative for chills and fever.  Respiratory: Negative.   Cardiovascular: Negative.   Gastrointestinal: Negative.   All other systems reviewed and are negative.    Objective: Vitals:   05/18/20 1419  BP: 123/70  Pulse: 77  Temp: 98.5 F (36.9 C)  TempSrc: Oral  SpO2: 98%  Weight: 161 lb (73 kg)  Height: 5' 9"  (1.753 m)     Body mass index is 23.78 kg/m.   Physical Exam Constitutional:      General: He is not in acute distress.    Appearance: Normal appearance.  HENT:     Head: Normocephalic and atraumatic.  Cardiovascular:     Rate and Rhythm: Normal rate and regular rhythm.     Comments: RIght sided Mediport in place. Pulmonary:     Effort: Pulmonary effort is normal. No respiratory distress.  Skin:    General: Skin is warm and dry.  Neurological:     General: No focal deficit  present.     Mental Status: He is alert and oriented to person, place, and time.     Pertinent Labs and Microbiology: CMP Latest Ref Rng & Units 05/05/2020 03/17/2020 03/10/2020  Glucose 65 - 99 mg/dL 97 97 94  BUN 7 - 25 mg/dL 19 21 23   Creatinine 0.70 - 1.18 mg/dL 0.88 1.09 0.91  Sodium 135 - 146 mmol/L 139 137 138  Potassium 3.5 - 5.3 mmol/L 4.4 3.8 4.8  Chloride 98 - 110 mmol/L 107 104 106  CO2 20 - 32 mmol/L 27 27 27   Calcium 8.6 - 10.3 mg/dL 9.1 8.1(L) 8.4(L)  Total Protein 6.1 - 8.1 g/dL 7.4 8.0 6.8  Total Bilirubin 0.2 - 1.2 mg/dL 0.5 0.3 0.4  Alkaline Phos 38 - 126 U/L - 140(H) -  AST 10 - 35 U/L 15 24 20   ALT 9 - 46 U/L 8(L) 18 13   CBC Latest Ref Rng & Units 05/05/2020 05/02/2020 03/17/2020  WBC 3.8 - 10.8 Thousand/uL 3.0(L) 3.4(L) 6.8  Hemoglobin 13.2 - 17.1 g/dL 12.4(L) 12.3(L)  13.0  Hematocrit 38 - 50 % 38.6 36.4(L) 38.2(L)  Platelets 140 - 400 Thousand/uL 211 184 337     Lab Results  Component Value Date   HIV1RNAQUANT 642,000 (H) 05/05/2020   CD4TABS 64 (L) 05/05/2020    RPR and STI Lab Results  Component Value Date   LABRPR NON-REACTIVE 05/05/2020    STI Results GC CT  05/05/2020 Negative Negative    Hepatitis B Lab Results  Component Value Date   HEPBSAB NON-REACTIVE 05/05/2020   HEPBSAG NON-REACTIVE 05/05/2020   HEPBCAB NON-REACTIVE 05/05/2020   Hepatitis C Lab Results  Component Value Date   HEPCAB NON-REACTIVE 05/05/2020   Hepatitis A Lab Results  Component Value Date   HAV REACTIVE (A) 05/05/2020   Lipids: Lab Results  Component Value Date   CHOL 158 05/05/2020   TRIG 92 05/05/2020   HDL 34 (L) 05/05/2020   CHOLHDL 4.6 05/05/2020   LDLCALC 105 (H) 05/05/2020    Assessment and Plan: Health care maintenance Immunizations: COVID-19: received Pfizer 08/2019 and 09/2019.  Reports will receive booster closer to home Influenza: Reports he will receive closer to his home Tdap: 2004 PCV-13: 02/2015 PPSV-23: 07/2017 Hepatitis A: immune by labs 04/2020 Hepatitis B: non-immune by labs 04/2020 Meningitis: 04/2003 Shingles: need to assess if previously been vaccinated MMR: born prior to 1955  Screening: Quantiferon: negative Hepatitis C: negative 04/2020 DEXA: needs to be addressed HLA [id number]: negative  Lymphoma ([place]) Recently diagnosed and following with Dr Justin Wallace for oncology care. Hoping to start chemotherapy soon.   AIDS (acquired immune deficiency syndrome) [facility]) Mr Justin Wallace is here today to initiate care for newly diagnosed HIV infection.  He is treatment naive with an initial HIV viral load of 642,000 and a CD4 count of 64.  No resistance mutations found on initial genotype. Will start patient on [place].  Discussed with patient treatment options and side effects, benefits of treatment, long term outcomes.  I discussed the  severity of untreated HIV including higher cancer risk, opportunistic infections, renal failure.  Also discussed needing to use condoms, partner disclosure, necessary vaccines, blood monitoring.  All questions answered.    Plan: -- start [place].  Will send Rx to [place] -- RTC labs 4 weeks, visit in 6 weeks   Need for pneumocystis prophylaxis Started on Bactrim for PCP prophylaxis.   Vaccine counseling Discussed COVID booster and influenza vaccination and have recommended both.  Patient will  obtain closer to his home.    Orders Placed This Encounter  Procedures  . Comprehensive metabolic panel    Standing Status:   Future    Standing Expiration Date:   07/18/2020  . CBC    Standing Status:   Future    Standing Expiration Date:   07/18/2020  . HIV-1 RNA quant-no reflex-bld    Standing Status:   Future    Standing Expiration Date:   07/18/2020  . T-helper cell (CD4)- ([facility] clinic only)    Standing Status:   Future    Standing Expiration Date:   07/18/2020   I spent greater than 60 minutes with the patient including greater than 50% of time in face to face counsel of the patient and in coordination of their care.   [name] for Infectious Disease [place] [facility] 05/18/2020, 3:04 PM

## 2020-05-18 NOTE — Assessment & Plan Note (Signed)
Mr Legrand is here today to initiate care for newly diagnosed HIV infection.  He is treatment naive with an initial HIV viral load of 642,000 and a CD4 count of 64.  No resistance mutations found on initial genotype. Will start patient on [place].  Discussed with patient treatment options and side effects, benefits of treatment, long term outcomes.  I discussed the severity of untreated HIV including higher cancer risk, opportunistic infections, renal failure.  Also discussed needing to use condoms, partner disclosure, necessary vaccines, blood monitoring.  All questions answered.    Plan: -- start [place].  Will send Rx to [place] -- RTC labs 4 weeks, visit in 6 weeks

## 2020-05-18 NOTE — Assessment & Plan Note (Signed)
Immunizations: COVID-19: received Pfizer 08/2019 and 09/2019.  Reports will receive booster closer to home Influenza: Reports he will receive closer to his home Tdap: 2004 PCV-13: 02/2015 PPSV-23: 07/2017 Hepatitis A: immune by labs 04/2020 Hepatitis B: non-immune by labs 04/2020 Meningitis: 04/2003 Shingles: need to assess if previously been vaccinated MMR: born prior to 1955  Screening: Quantiferon: negative Hepatitis C: negative 04/2020 DEXA: needs to be addressed HLA [id number]: negative

## 2020-05-18 NOTE — Patient Instructions (Signed)
It was nice meeting you today.  Please schedule an appointment with me in about 6 weeks and a lab appointment in about 4 weeks.  Below is some information regarding your medications.   Bictegravir; Emtricitabine; Tenofovir Alafenamide tablets What is this medicine? BICTEGRAVIR; EMTRICITABINE: TENOFOVIR ALAFENAMIDE (bik teg' ra veer; em tri SIT uh bean; ten of' oh vir al" a fen' a mide) is 3 antiretroviral medicines in 1 tablet. It is used to treat HIV. This medicine is not a cure for HIV. This medicine can lower, but not fully prevent, the risk of spreading HIV to others. This medicine may be used for other purposes; ask your health care provider or pharmacist if you have questions. COMMON BRAND NAME(S): BIKTARVY What should I tell my health care provider before I take this medicine? They need to know if you have any of these conditions:  kidney disease  liver disease  an unusual or allergic reaction to bictegravir, emtricitabine, tenofovir, other medicines, foods, dyes, or preservatives  pregnant or trying to get pregnant  breast-feeding How should I use this medicine? Take this medicine by mouth with a glass of water. Follow the directions on the prescription label. You can take it with or without food. If it upsets your stomach, take it with food. Take your medicine at regular intervals. Do not take your medicine more often than directed. For your anti-HIV therapy to work as well as possible, take each dose exactly as prescribed. Do not skip doses or stop your medicine even if you feel better. Skipping doses may make the HIV virus resistant to this medicine and other medicines. Do not stop taking except on your doctor's advice. Talk to your pediatrician regarding the use of this medicine in children. While this drug may be prescribed for selected conditions, precautions do apply. Overdosage: If you think you have taken too much of this medicine contact a poison control center or  emergency room at once. NOTE: This medicine is only for you. Do not share this medicine with others. What if I miss a dose? If you miss a dose, take it as soon as you can. If it is almost time for your next dose, take only that dose. Do not take double or extra doses. What may interact with this medicine? Do not take this medicine with any of the following medications:  adefovir  any medicine that contains lamivudine  dofetilide  rifampin This medicine may also interact with the following medications:  antacids  certain antibiotics like rifabutin, rifapentine, aminoglycosides  certain medicines for seizures like carbamazepine, oxcarbazepine, phenobarbital, phenytoin  medicines for viral infection like cidofovir, acyclovir, valacyclovir, ganciclovir, valganciclovir  metformin  non-steroidal antiinflammatory drugs (NSAIDs)  St. John's Wort  sucralfate  supplements containing calcium or iron This list may not describe all possible interactions. Give your health care provider a list of all the medicines, herbs, non-prescription drugs, or dietary supplements you use. Also tell them if you smoke, drink alcohol, or use illegal drugs. Some items may interact with your medicine. What should I watch for while using this medicine? Visit your doctor or health care professional for regular checkups. Discuss any new symptoms with your doctor. You will need to have important blood work done while on this medicine. HIV is spread to others through sexual or blood contact. Talk to your doctor about how to stop the spread of HIV. If you have hepatitis B, talk to your doctor if you plan to stop this medicine. The symptoms of hepatitis  B may get worse if you stop this medicine. What side effects may I notice from receiving this medicine? Side effects that you should report to your doctor or health care professional as soon as possible:  allergic reactions like skin rash, itching or hives,  swelling of the face, lips, or tongue  breathing problems  fast, irregular heartbeat  muscle pain or weakness  signs of infection like fever or chills; cough; sore throat  signs and symptoms of kidney injury like trouble passing urine or change in the amount of urine  signs and symptoms of liver injury like dark yellow or brown urine; general ill feeling or flu-like symptoms; light-colored stools; loss of appetite; right upper belly pain; unusually weak or tired; yellowing of the eyes or skin Side effects that usually do not require medical attention (report these to your doctor or health care professional if they continue or are bothersome):  diarrhea  headache  nausea This list may not describe all possible side effects. Call your doctor for medical advice about side effects. You may report side effects to FDA at 1-800-FDA-1088. Where should I keep my medicine? Keep out of the reach of children. Store at room temperature below 30 degrees C (86 degrees F). Throw away any unused medicine after the expiration date. NOTE: This sheet is a summary. It may not cover all possible information. If you have questions about this medicine, talk to your doctor, pharmacist, or health care provider.  2020 [employer] Standard (2018-01-15 09:59:22)

## 2020-05-18 NOTE — Assessment & Plan Note (Signed)
Discussed COVID booster and influenza vaccination and have recommended both.  Patient will obtain closer to his home.

## 2020-05-18 NOTE — Assessment & Plan Note (Signed)
Started on Bactrim for PCP prophylaxis.

## 2020-05-19 ENCOUNTER — Ambulatory Visit: Payer: Medicare HMO | Admitting: Pharmacist

## 2020-05-19 ENCOUNTER — Encounter: Payer: Medicare HMO | Admitting: Infectious Diseases

## 2020-05-19 MED FILL — BIKTARVY 50-200-25 MG TABS: 50-200-25 | 30 days supply | Qty: 30 | Fill #0

## 2020-05-25 ENCOUNTER — Encounter: Payer: Self-pay | Admitting: Oncology

## 2020-05-27 ENCOUNTER — Encounter: Payer: Self-pay | Admitting: Oncology

## 2020-05-27 ENCOUNTER — Telehealth: Payer: Self-pay

## 2020-05-27 ENCOUNTER — Ambulatory Visit (INDEPENDENT_AMBULATORY_CARE_PROVIDER_SITE_OTHER): Payer: Medicare HMO

## 2020-05-27 ENCOUNTER — Other Ambulatory Visit: Payer: Self-pay | Admitting: Oncology

## 2020-05-27 ENCOUNTER — Other Ambulatory Visit: Payer: Self-pay

## 2020-05-27 DIAGNOSIS — Z23 Encounter for immunization: Secondary | ICD-10-CM

## 2020-05-27 DIAGNOSIS — C8510 Unspecified B-cell lymphoma, unspecified site: Secondary | ICD-10-CM

## 2020-05-27 HISTORY — DX: Unspecified B-cell lymphoma, unspecified site: C85.10

## 2020-05-27 NOTE — Progress Notes (Signed)
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   [id number]:DA-EPOCH-R q21 Days:   A cycle is every 21 days:     Prednisone      Rituximab-xxxx      Etoposide      Doxorubicin      Vincristine      Cyclophosphamide      Filgrastim-xxxx   **Always confirm dose/schedule in your pharmacy ordering system**  REASON: Other Reason PRIOR TREATMENT: Off Pathway: DA-EPOCH-R q21 Days TREATMENT RESPONSE: Unable to Evaluate  START OFF PATHWAY REGIMEN - Lymphoma and CLL   [id number]:DA-EPOCH-R q21 Days:   A cycle is every 21 days:     Prednisone      Rituximab-xxxx      Etoposide      Doxorubicin      Vincristine      Cyclophosphamide      Filgrastim-xxxx   **Always confirm dose/schedule in your pharmacy ordering system**  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

## 2020-05-27 NOTE — Telephone Encounter (Signed)
Please schedule  chemo education for dose adjusted EPOCH one day next week.   Please schedule the following, the week of 11/8:  day 1: lab/MD/ Rituximab/doxorubicin/ etoposide/vincristine day 2-4 : doxorubicin, etoposide/ vincristine  day 5: Cytoxan & onpro   We will work on scheuduling intrathecal methotrexate and we will call pt with appts once everything is scheduled.

## 2020-05-27 NOTE — Telephone Encounter (Signed)
Dr. 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

## 2020-05-27 NOTE — Telephone Encounter (Signed)
Order for intrathecal methotrexate placed in epic. Request faxed to centralized scheduling.

## 2020-05-27 NOTE — Telephone Encounter (Signed)
Per secure chat conversation on 10/29.   Justin Wallace, NT: day 1 will takes about 8.5hrs. lab/MD will have to be on day 1 and Chemo on day 2. meaning this tx will run into another week with day 5.  Haywood Pao, RN: could he have labs & MD appt on a Friday and start chemo Monday?  Earlie Server, MD: can he do Rituximab on Day 0 with MD visit, will that decrease his infusion hours?  Kim Hinson,RN: Sonia Baller is going to look into it, she says there are several different ways to give it.    waiting on response from Pharmacy [facility] [place], [facility]).

## 2020-05-27 NOTE — Progress Notes (Signed)
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

## 2020-05-30 ENCOUNTER — Other Ambulatory Visit: Payer: Self-pay | Admitting: Oncology

## 2020-05-30 NOTE — Patient Instructions (Signed)
Rituximab injection What is this medicine? RITUXIMAB (ri TUX i mab) is a monoclonal antibody. It is used to treat certain types of cancer like non-Hodgkin lymphoma and chronic lymphocytic leukemia. It is also used to treat rheumatoid arthritis, granulomatosis with polyangiitis (or Wegener's granulomatosis), microscopic polyangiitis, and pemphigus vulgaris. This medicine may be used for other purposes; ask your health care provider or pharmacist if you have questions. COMMON BRAND NAME(S): Rituxan, RUXIENCE What should I tell my health care provider before I take this medicine? They need to know if you have any of these conditions:  heart disease  infection (especially a virus infection such as hepatitis B, chickenpox, cold sores, or herpes)  immune system problems  irregular heartbeat  kidney disease  low blood counts, like low white cell, platelet, or red cell counts  lung or breathing disease, like asthma  recently received or scheduled to receive a vaccine  an unusual or allergic reaction to rituximab, other medicines, foods, dyes, or preservatives  pregnant or trying to get pregnant  breast-feeding How should I use this medicine? This medicine is for infusion into a vein. It is administered in a hospital or clinic by a specially trained health care professional. A special MedGuide will be given to you by the pharmacist with each prescription and refill. Be sure to read this information carefully each time. Talk to your pediatrician regarding the use of this medicine in children. This medicine is not approved for use in children. Overdosage: If you think you have taken too much of this medicine contact a poison control center or emergency room at once. NOTE: This medicine is only for you. Do not share this medicine with others. What if I miss a dose? It is important not to miss a dose. Call your doctor or health care professional if you are unable to keep an appointment. What  may interact with this medicine?  cisplatin  live virus vaccines This list may not describe all possible interactions. Give your health care provider a list of all the medicines, herbs, non-prescription drugs, or dietary supplements you use. Also tell them if you smoke, drink alcohol, or use illegal drugs. Some items may interact with your medicine. What should I watch for while using this medicine? Your condition will be monitored carefully while you are receiving this medicine. You may need blood work done while you are taking this medicine. This medicine can cause serious allergic reactions. To reduce your risk you may need to take medicine before treatment with this medicine. Take your medicine as directed. In some patients, this medicine may cause a serious brain infection that may cause death. If you have any problems seeing, thinking, speaking, walking, or standing, tell your healthcare professional right away. If you cannot reach your healthcare professional, urgently seek other source of medical care. Call your doctor or health care professional for advice if you get a fever, chills or sore throat, or other symptoms of a cold or flu. Do not treat yourself. This drug decreases your body's ability to fight infections. Try to avoid being around people who are sick. Do not become pregnant while taking this medicine or for at least 12 months after stopping it. Women should inform their doctor if they wish to become pregnant or think they might be pregnant. There is a potential for serious side effects to an unborn child. Talk to your health care professional or pharmacist for more information. Do not breast-feed an infant while taking this medicine or for at  least 6 months after stopping it. What side effects may I notice from receiving this medicine? Side effects that you should report to your doctor or health care professional as soon as possible:  allergic reactions like skin rash, itching or  hives; swelling of the face, lips, or tongue  breathing problems  chest pain  changes in vision  diarrhea  headache with fever, neck stiffness, sensitivity to light, nausea, or confusion  fast, irregular heartbeat  loss of memory  low blood counts - this medicine may decrease the number of white blood cells, red blood cells and platelets. You may be at increased risk for infections and bleeding.  mouth sores  problems with balance, talking, or walking  redness, blistering, peeling or loosening of the skin, including inside the mouth  signs of infection - fever or chills, cough, sore throat, pain or difficulty passing urine  signs and symptoms of kidney injury like trouble passing urine or change in the amount of urine  signs and symptoms of liver injury like dark yellow or brown urine; general ill feeling or flu-like symptoms; light-colored stools; loss of appetite; nausea; right upper belly pain; unusually weak or tired; yellowing of the eyes or skin  signs and symptoms of low blood pressure like dizziness; feeling faint or lightheaded, falls; unusually weak or tired  stomach pain  swelling of the ankles, feet, hands  unusual bleeding or bruising  vomiting Side effects that usually do not require medical attention (report to your doctor or health care professional if they continue or are bothersome):  headache  joint pain  muscle cramps or muscle pain  nausea  tiredness This list may not describe all possible side effects. Call your doctor for medical advice about side effects. You may report side effects to FDA at 1-800-FDA-1088. Where should I keep my medicine? This drug is given in a hospital or clinic and will not be stored at home. NOTE: This sheet is a summary. It may not cover all possible information. If you have questions about this medicine, talk to your doctor, pharmacist, or health care provider.  2020 Elsevier/Gold Standard (2018-08-27  22:01:36) Doxorubicin injection What is this medicine? DOXORUBICIN (dox oh ROO bi sin) is a chemotherapy drug. It is used to treat many kinds of cancer like leukemia, lymphoma, neuroblastoma, sarcoma, and Wilms' tumor. It is also used to treat bladder cancer, breast cancer, lung cancer, ovarian cancer, stomach cancer, and thyroid cancer. This medicine may be used for other purposes; ask your health care provider or pharmacist if you have questions. COMMON BRAND NAME(S): Adriamycin, Adriamycin PFS, Adriamycin RDF, Rubex What should I tell my health care provider before I take this medicine? They need to know if you have any of these conditions:  heart disease  history of low blood counts caused by a medicine  liver disease  recent or ongoing radiation therapy  an unusual or allergic reaction to doxorubicin, other chemotherapy agents, other medicines, foods, dyes, or preservatives  pregnant or trying to get pregnant  breast-feeding How should I use this medicine? This drug is given as an infusion into a vein. It is administered in a hospital or clinic by a specially trained health care professional. If you have pain, swelling, burning or any unusual feeling around the site of your injection, tell your health care professional right away. Talk to your pediatrician regarding the use of this medicine in children. Special care may be needed. Overdosage: If you think you have taken too much of  this medicine contact a poison control center or emergency room at once. NOTE: This medicine is only for you. Do not share this medicine with others. What if I miss a dose? It is important not to miss your dose. Call your doctor or health care professional if you are unable to keep an appointment. What may interact with this medicine? This medicine may interact with the following medications:  6-mercaptopurine  paclitaxel  phenytoin  St. John's Wort  trastuzumab  verapamil This list may not  describe all possible interactions. Give your health care provider a list of all the medicines, herbs, non-prescription drugs, or dietary supplements you use. Also tell them if you smoke, drink alcohol, or use illegal drugs. Some items may interact with your medicine. What should I watch for while using this medicine? This drug may make you feel generally unwell. This is not uncommon, as chemotherapy can affect healthy cells as well as cancer cells. Report any side effects. Continue your course of treatment even though you feel ill unless your doctor tells you to stop. There is a maximum amount of this medicine you should receive throughout your life. The amount depends on the medical condition being treated and your overall health. Your doctor will watch how much of this medicine you receive in your lifetime. Tell your doctor if you have taken this medicine before. You may need blood work done while you are taking this medicine. Your urine may turn red for a few days after your dose. This is not blood. If your urine is dark or brown, call your doctor. In some cases, you may be given additional medicines to help with side effects. Follow all directions for their use. Call your doctor or health care professional for advice if you get a fever, chills or sore throat, or other symptoms of a cold or flu. Do not treat yourself. This drug decreases your body's ability to fight infections. Try to avoid being around people who are sick. This medicine may increase your risk to bruise or bleed. Call your doctor or health care professional if you notice any unusual bleeding. Talk to your doctor about your risk of cancer. You may be more at risk for certain types of cancers if you take this medicine. Do not become pregnant while taking this medicine or for 6 months after stopping it. Women should inform their doctor if they wish to become pregnant or think they might be pregnant. Men should not father a child while  taking this medicine and for 6 months after stopping it. There is a potential for serious side effects to an unborn child. Talk to your health care professional or pharmacist for more information. Do not breast-feed an infant while taking this medicine. This medicine has caused ovarian failure in some women and reduced sperm counts in some men This medicine may interfere with the ability to have a child. Talk with your doctor or health care professional if you are concerned about your fertility. This medicine may cause a decrease in Co-Enzyme Q-10. You should make sure that you get enough Co-Enzyme Q-10 while you are taking this medicine. Discuss the foods you eat and the vitamins you take with your health care professional. What side effects may I notice from receiving this medicine? Side effects that you should report to your doctor or health care professional as soon as possible:  allergic reactions like skin rash, itching or hives, swelling of the face, lips, or tongue  breathing problems  chest  pain  fast or irregular heartbeat  low blood counts - this medicine may decrease the number of white blood cells, red blood cells and platelets. You may be at increased risk for infections and bleeding.  pain, redness, or irritation at site where injected  signs of infection - fever or chills, cough, sore throat, pain or difficulty passing urine  signs of decreased platelets or bleeding - bruising, pinpoint red spots on the skin, black, tarry stools, blood in the urine  swelling of the ankles, feet, hands  tiredness  weakness Side effects that usually do not require medical attention (report to your doctor or health care professional if they continue or are bothersome):  diarrhea  hair loss  mouth sores  nail discoloration or damage  nausea  red colored urine  vomiting This list may not describe all possible side effects. Call your doctor for medical advice about side effects.  You may report side effects to FDA at 1-800-FDA-1088. Where should I keep my medicine? This drug is given in a hospital or clinic and will not be stored at home. NOTE: This sheet is a summary. It may not cover all possible information. If you have questions about this medicine, talk to your doctor, pharmacist, or health care provider.  2020 Elsevier/Gold Standard (2017-02-27 11:01:26) Vincristine injection What is this medicine? VINCRISTINE (vin KRIS teen) is a chemotherapy drug. It slows the growth of cancer cells. This medicine is used to treat many types of cancer like Hodgkin's disease, leukemia, non-Hodgkin's lymphoma, neuroblastoma (brain cancer), rhabdomyosarcoma, and Wilms' tumor. This medicine may be used for other purposes; ask your health care provider or pharmacist if you have questions. COMMON BRAND NAME(S): Oncovin, Vincasar PFS What should I tell my health care provider before I take this medicine? They need to know if you have any of these conditions:  blood disorders  gout  infection (especially chickenpox, cold sores, or herpes)  kidney disease  liver disease  lung disease  nervous system disease like Charcot-Marie-Tooth (CMT)  recent or ongoing radiation therapy  an unusual or allergic reaction to vincristine, other chemotherapy agents, other medicines, foods, dyes, or preservatives  pregnant or trying to get pregnant  breast-feeding How should I use this medicine? This drug is given as an infusion into a vein. It is administered in a hospital or clinic by a specially trained health care professional. If you have pain, swelling, burning, or any unusual feeling around the site of your injection, tell your health care professional right away. Talk to your pediatrician regarding the use of this medicine in children. While this drug may be prescribed for selected conditions, precautions do apply. Overdosage: If you think you have taken too much of this medicine  contact a poison control center or emergency room at once. NOTE: This medicine is only for you. Do not share this medicine with others. What if I miss a dose? It is important not to miss your dose. Call your doctor or health care professional if you are unable to keep an appointment. What may interact with this medicine? Do not take this medicine with any of the following medications:  itraconazole  mibefradil  voriconazole This medicine may also interact with the following medications:  cyclosporine  erythromycin  fluconazole  ketoconazole  medicines for HIV like delavirdine, efavirenz, nevirapine  medicines for seizures like ethotoin, fosphenotoin, phenytoin  medicines to increase blood counts like filgrastim, pegfilgrastim, sargramostim  other chemotherapy drugs like cisplatin, L-asparaginase, methotrexate, mitomycin, paclitaxel  pegaspargase  vaccines  zalcitabine, ddC Talk to your doctor or health care professional before taking any of these medicines:  acetaminophen  aspirin  ibuprofen  ketoprofen  naproxen This list may not describe all possible interactions. Give your health care provider a list of all the medicines, herbs, non-prescription drugs, or dietary supplements you use. Also tell them if you smoke, drink alcohol, or use illegal drugs. Some items may interact with your medicine. What should I watch for while using this medicine? This drug may make you feel generally unwell. This is not uncommon, as chemotherapy can affect healthy cells as well as cancer cells. Report any side effects. Continue your course of treatment even though you feel ill unless your doctor tells you to stop. You may need blood work done while you are taking this medicine. This medicine will cause constipation. Try to have a bowel movement at least every 2 to 3 days. If you do not have a bowel movement for 3 days, call your doctor or health care professional. In some cases, you  may be given additional medicines to help with side effects. Follow all directions for their use. Do not become pregnant while taking this medicine. Women should inform their doctor if they wish to become pregnant or think they might be pregnant. There is a potential for serious side effects to an unborn child. Talk to your health care professional or pharmacist for more information. Do not breast-feed an infant while taking this medicine. This medicine may make it more difficult to get pregnant or to father a child. Talk to your healthcare professional if you are concerned about your fertility. What side effects may I notice from receiving this medicine? Side effects that you should report to your doctor or health care professional as soon as possible:  allergic reactions like skin rash, itching or hives, swelling of the face, lips, or tongue  breathing problems  confusion or changes in emotions or moods  constipation  cough  mouth sores  muscle weakness  nausea and vomiting  pain, swelling, redness or irritation at the injection site  pain, tingling, numbness in the hands or feet  problems with balance, talking, walking  seizures  stomach pain  trouble passing urine or change in the amount of urine Side effects that usually do not require medical attention (report to your doctor or health care professional if they continue or are bothersome):  diarrhea  hair loss  jaw pain  loss of appetite This list may not describe all possible side effects. Call your doctor for medical advice about side effects. You may report side effects to FDA at 1-800-FDA-1088. Where should I keep my medicine? This drug is given in a hospital or clinic and will not be stored at home. NOTE: This sheet is a summary. It may not cover all possible information. If you have questions about this medicine, talk to your doctor, pharmacist, or health care provider.  2020 Elsevier/Gold Standard  (2018-04-18 08:55:02) Etoposide, VP-16 injection What is this medicine? ETOPOSIDE, VP-16 (e toe POE side) is a chemotherapy drug. It is used to treat testicular cancer, lung cancer, and other cancers. This medicine may be used for other purposes; ask your health care provider or pharmacist if you have questions. COMMON BRAND NAME(S): Etopophos, Toposar, VePesid What should I tell my health care provider before I take this medicine? They need to know if you have any of these conditions:  infection  kidney disease  liver disease  low blood counts, like  low white cell, platelet, or red cell counts  an unusual or allergic reaction to etoposide, other medicines, foods, dyes, or preservatives  pregnant or trying to get pregnant  breast-feeding How should I use this medicine? This medicine is for infusion into a vein. It is administered in a hospital or clinic by a specially trained health care professional. Talk to your pediatrician regarding the use of this medicine in children. Special care may be needed. Overdosage: If you think you have taken too much of this medicine contact a poison control center or emergency room at once. NOTE: This medicine is only for you. Do not share this medicine with others. What if I miss a dose? It is important not to miss your dose. Call your doctor or health care professional if you are unable to keep an appointment. What may interact with this medicine? This medicine may interact with the following medications:  warfarin This list may not describe all possible interactions. Give your health care provider a list of all the medicines, herbs, non-prescription drugs, or dietary supplements you use. Also tell them if you smoke, drink alcohol, or use illegal drugs. Some items may interact with your medicine. What should I watch for while using this medicine? Visit your doctor for checks on your progress. This drug may make you feel generally unwell. This is  not uncommon, as chemotherapy can affect healthy cells as well as cancer cells. Report any side effects. Continue your course of treatment even though you feel ill unless your doctor tells you to stop. In some cases, you may be given additional medicines to help with side effects. Follow all directions for their use. Call your doctor or health care professional for advice if you get a fever, chills or sore throat, or other symptoms of a cold or flu. Do not treat yourself. This drug decreases your body's ability to fight infections. Try to avoid being around people who are sick. This medicine may increase your risk to bruise or bleed. Call your doctor or health care professional if you notice any unusual bleeding. Talk to your doctor about your risk of cancer. You may be more at risk for certain types of cancers if you take this medicine. Do not become pregnant while taking this medicine or for at least 6 months after stopping it. Women should inform their doctor if they wish to become pregnant or think they might be pregnant. Women of child-bearing potential will need to have a negative pregnancy test before starting this medicine. There is a potential for serious side effects to an unborn child. Talk to your health care professional or pharmacist for more information. Do not breast-feed an infant while taking this medicine. Men must use a latex condom during sexual contact with a woman while taking this medicine and for at least 4 months after stopping it. A latex condom is needed even if you have had a vasectomy. Contact your doctor right away if your partner becomes pregnant. Do not donate sperm while taking this medicine and for at least 4 months after you stop taking this medicine. Men should inform their doctors if they wish to father a child. This medicine may lower sperm counts. What side effects may I notice from receiving this medicine? Side effects that you should report to your doctor or health  care professional as soon as possible:  allergic reactions like skin rash, itching or hives, swelling of the face, lips, or tongue  low blood counts - this medicine  may decrease the number of white blood cells, red blood cells, and platelets. You may be at increased risk for infections and bleeding  nausea, vomiting  redness, blistering, peeling or loosening of the skin, including inside the mouth  signs and symptoms of infection like fever; chills; cough; sore throat; pain or trouble passing urine  signs and symptoms of low red blood cells or anemia such as unusually weak or tired; feeling faint or lightheaded; falls; breathing problems  unusual bruising or bleeding Side effects that usually do not require medical attention (report to your doctor or health care professional if they continue or are bothersome):  changes in taste  diarrhea  hair loss  loss of appetite  mouth sores This list may not describe all possible side effects. Call your doctor for medical advice about side effects. You may report side effects to FDA at 1-800-FDA-1088. Where should I keep my medicine? This drug is given in a hospital or clinic and will not be stored at home. NOTE: This sheet is a summary. It may not cover all possible information. If you have questions about this medicine, talk to your doctor, pharmacist, or health care provider.  2020 Elsevier/Gold Standard (2018-09-10 16:57:15) Cyclophosphamide Injection What is this medicine? CYCLOPHOSPHAMIDE (sye kloe FOSS fa mide) is a chemotherapy drug. It slows the growth of cancer cells. This medicine is used to treat many types of cancer like lymphoma, myeloma, leukemia, breast cancer, and ovarian cancer, to name a few. This medicine may be used for other purposes; ask your health care provider or pharmacist if you have questions. COMMON BRAND NAME(S): Cytoxan, Neosar What should I tell my health care provider before I take this medicine? They need  to know if you have any of these conditions:  heart disease  history of irregular heartbeat  infection  kidney disease  liver disease  low blood counts, like white cells, platelets, or red blood cells  on hemodialysis  recent or ongoing radiation therapy  scarring or thickening of the lungs  trouble passing urine  an unusual or allergic reaction to cyclophosphamide, other medicines, foods, dyes, or preservatives  pregnant or trying to get pregnant  breast-feeding How should I use this medicine? This drug is usually given as an injection into a vein or muscle or by infusion into a vein. It is administered in a hospital or clinic by a specially trained health care professional. Talk to your pediatrician regarding the use of this medicine in children. Special care may be needed. Overdosage: If you think you have taken too much of this medicine contact a poison control center or emergency room at once. NOTE: This medicine is only for you. Do not share this medicine with others. What if I miss a dose? It is important not to miss your dose. Call your doctor or health care professional if you are unable to keep an appointment. What may interact with this medicine?  amphotericin B  azathioprine  certain antivirals for HIV or hepatitis  certain medicines for blood pressure, heart disease, irregular heart beat  certain medicines that treat or prevent blood clots like warfarin  certain other medicines for cancer  cyclosporine  etanercept  indomethacin  medicines that relax muscles for surgery  medicines to increase blood counts  metronidazole This list may not describe all possible interactions. Give your health care provider a list of all the medicines, herbs, non-prescription drugs, or dietary supplements you use. Also tell them if you smoke, drink alcohol, or use  illegal drugs. Some items may interact with your medicine. What should I watch for while using this  medicine? Your condition will be monitored carefully while you are receiving this medicine. You may need blood work done while you are taking this medicine. Drink water or other fluids as directed. Urinate often, even at night. Some products may contain alcohol. Ask your health care professional if this medicine contains alcohol. Be sure to tell all health care professionals you are taking this medicine. Certain medicines, like metronidazole and disulfiram, can cause an unpleasant reaction when taken with alcohol. The reaction includes flushing, headache, nausea, vomiting, sweating, and increased thirst. The reaction can last from 30 minutes to several hours. Do not become pregnant while taking this medicine or for 1 year after stopping it. Women should inform their health care professional if they wish to become pregnant or think they might be pregnant. Men should not father a child while taking this medicine and for 4 months after stopping it. There is potential for serious side effects to an unborn child. Talk to your health care professional for more information. Do not breast-feed an infant while taking this medicine or for 1 week after stopping it. This medicine has caused ovarian failure in some women. This medicine may make it more difficult to get pregnant. Talk to your health care professional if you are concerned about your fertility. This medicine has caused decreased sperm counts in some men. This may make it more difficult to father a child. Talk to your health care professional if you are concerned about your fertility. Call your health care professional for advice if you get a fever, chills, or sore throat, or other symptoms of a cold or flu. Do not treat yourself. This medicine decreases your body's ability to fight infections. Try to avoid being around people who are sick. Avoid taking medicines that contain aspirin, acetaminophen, ibuprofen, naproxen, or ketoprofen unless instructed by  your health care professional. These medicines may hide a fever. Talk to your health care professional about your risk of cancer. You may be more at risk for certain types of cancer if you take this medicine. If you are going to need surgery or other procedure, tell your health care professional that you are using this medicine. Be careful brushing or flossing your teeth or using a toothpick because you may get an infection or bleed more easily. If you have any dental work done, tell your dentist you are receiving this medicine. What side effects may I notice from receiving this medicine? Side effects that you should report to your doctor or health care professional as soon as possible:  allergic reactions like skin rash, itching or hives, swelling of the face, lips, or tongue  breathing problems  nausea, vomiting  signs and symptoms of bleeding such as bloody or black, tarry stools; red or dark brown urine; spitting up blood or brown material that looks like coffee grounds; red spots on the skin; unusual bruising or bleeding from the eyes, gums, or nose  signs and symptoms of heart failure like fast, irregular heartbeat, sudden weight gain; swelling of the ankles, feet, hands  signs and symptoms of infection like fever; chills; cough; sore throat; pain or trouble passing urine  signs and symptoms of kidney injury like trouble passing urine or change in the amount of urine  signs and symptoms of liver injury like dark yellow or brown urine; general ill feeling or flu-like symptoms; light-colored stools; loss of appetite; nausea; right  upper belly pain; unusually weak or tired; yellowing of the eyes or skin Side effects that usually do not require medical attention (report to your doctor or health care professional if they continue or are bothersome):  confusion  decreased hearing  diarrhea  facial flushing  hair loss  headache  loss of appetite  missed menstrual periods  signs  and symptoms of low red blood cells or anemia such as unusually weak or tired; feeling faint or lightheaded; falls  skin discoloration This list may not describe all possible side effects. Call your doctor for medical advice about side effects. You may report side effects to FDA at 1-800-FDA-1088. Where should I keep my medicine? This drug is given in a hospital or clinic and will not be stored at home. NOTE: This sheet is a summary. It may not cover all possible information. If you have questions about this medicine, talk to your doctor, pharmacist, or health care provider.  2020 [employer] (2019-04-20 09:53:29)

## 2020-05-31 ENCOUNTER — Other Ambulatory Visit: Payer: Self-pay

## 2020-05-31 ENCOUNTER — Inpatient Hospital Stay (HOSPITAL_BASED_OUTPATIENT_CLINIC_OR_DEPARTMENT_OTHER): Payer: Medicare HMO | Admitting: Oncology

## 2020-05-31 ENCOUNTER — Inpatient Hospital Stay: Payer: Medicare HMO | Attending: Oncology

## 2020-05-31 ENCOUNTER — Encounter: Payer: Self-pay | Admitting: Oncology

## 2020-05-31 DIAGNOSIS — Z5189 Encounter for other specified aftercare: Secondary | ICD-10-CM | POA: Insufficient documentation

## 2020-05-31 DIAGNOSIS — C8510 Unspecified B-cell lymphoma, unspecified site: Secondary | ICD-10-CM

## 2020-05-31 DIAGNOSIS — Z5111 Encounter for antineoplastic chemotherapy: Secondary | ICD-10-CM | POA: Insufficient documentation

## 2020-05-31 DIAGNOSIS — Z5112 Encounter for antineoplastic immunotherapy: Secondary | ICD-10-CM | POA: Insufficient documentation

## 2020-05-31 DIAGNOSIS — C8514 Unspecified B-cell lymphoma, lymph nodes of axilla and upper limb: Secondary | ICD-10-CM | POA: Insufficient documentation

## 2020-05-31 DIAGNOSIS — K900 Celiac disease: Secondary | ICD-10-CM | POA: Insufficient documentation

## 2020-05-31 DIAGNOSIS — B20 Human immunodeficiency virus [HIV] disease: Secondary | ICD-10-CM | POA: Insufficient documentation

## 2020-05-31 DIAGNOSIS — D649 Anemia, unspecified: Secondary | ICD-10-CM | POA: Insufficient documentation

## 2020-05-31 MED ORDER — MAGIC MOUTHWASH W/LIDOCAINE: 5.0000 mL | Freq: Four times a day (QID) | ORAL | 1 refills | Status: DC | PRN

## 2020-05-31 NOTE — Telephone Encounter (Signed)
Contacted Justin Wallace in Same Day Surgery dept and spoke to her regarding medication release, the day of procedure. She confirmed that medication is to be released here at the cancer center. [name] notified of this as well. Injection encounter added on 11/16 for medication release purposes.   Contacted main pharmacy and spoke to, [name], in regards to medication delivery to IR on the day of procedure. Per [name], once medication is ready, we can call main pharmacy to come and pickup medication and take to IR. [name] notified.   Pt has been called and notified of all appointments. He states that he received a call from [facility] regarding his procedure appt. Pt voiced being very pleased with the care he has been getting from Dr. Collie Siad team.

## 2020-05-31 NOTE — Telephone Encounter (Signed)
Per Haywood Pao RN, Ok to schedule lab/MD/ rituximamab and dox,etop,vin combo on day 1 as long as patient is back in infusion by 9am. days 2-4 will need to be in the afternoon because a new Rituxan is a long tx the first day.  Please schedule the following, the week of 11/8:    day 1: lab/MD/ Rituximab/doxorubicin/ etoposide/vincristine day 2-4 : doxorubicin, etoposide/ vincristine  day 5: Cytoxan & onpro   I will contact patient with appts once scheduled.

## 2020-05-31 NOTE — Telephone Encounter (Signed)
Done Pt has been scheduled as requested Day 1-5

## 2020-05-31 NOTE — Progress Notes (Signed)
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9 % 250 mL chemo infusion, 750 mg/m2, Intravenous,  Once, 0 of 4 cycles  for chemotherapy treatment.    06/06/2020 -  Chemotherapy   The patient had palonosetron (ALOXI) injection 0.25 mg, 0.25 mg, Intravenous,  Once, 0 of 6 cycles pegfilgrastim (NEULASTA ONPRO KIT) injection 6 mg, 6 mg, Subcutaneous, Once, 0 of 6 cycles methotrexate (PF) 12 mg in sodium chloride (PF) 0.9 % INTRATHECAL chemo injection, , Intrathecal,  Once, 0 of 1 cycle cyclophosphamide (CYTOXAN) 1,420 mg in sodium chloride 0.9 % 250 mL chemo infusion, 750 mg/m2, Intravenous,  Once, 0 of 6 cycles DOXOrubicin (ADRIAMYCIN) 18 mg, etoposide (VEPESID) 94 mg, vinCRIStine (ONCOVIN) 0.8 mg in sodium chloride 0.9 % 500 mL chemo infusion, , Intravenous, Once, 0 of 6 cycles riTUXimab-pvvr (RUXIENCE) 700 mg in sodium chloride 0.9 % 250 mL (2.1875 mg/mL)  infusion, 375 mg/m2 = 700 mg (original dose ), Intravenous,  Once, 0 of 6 cycles Dose modification: 375 mg/m2 (Cycle 1)  for chemotherapy treatment.      Interval history-Justin Wallace is a 74 year old male who presents to chemo care clinic today for initial meeting in preparation for starting chemotherapy. I introduced the chemo care clinic and we discussed that the role of the clinic is to assist those who are at an increased risk of emergency room visits and/or complications during the course of chemotherapy treatment. We discussed that the increased risk takes into account factors such as age, performance status, and co-morbidities. We also discussed that for some, this might include barriers to care such as not having a primary care provider, lack of insurance/transportation, or not being able to afford medications. We discussed that the goal of the program is to help prevent unplanned ER visits and help reduce complications during chemotherapy. We do this by discussing specific risk factors to each individual and identifying ways that we can help improve these risk factors and reduce barriers to care.   ECOG FS:1 - Symptomatic but completely ambulatory  Review of systems- Review of Systems  Constitutional: Negative.  Negative for chills, fever, malaise/fatigue and weight loss.  HENT: Negative for congestion, ear pain and tinnitus.   Eyes: Negative.  Negative for blurred vision and double vision.  Respiratory: Negative.  Negative for cough, sputum production and shortness of breath.   Cardiovascular: Negative.  Negative for chest pain, palpitations and leg swelling.  Gastrointestinal: Negative.  Negative for abdominal pain, constipation, diarrhea, nausea and vomiting.  Genitourinary: Negative for dysuria, frequency and urgency.  Musculoskeletal: Negative for back pain and falls.  Skin: Negative.  Negative for rash.  Neurological: Negative.  Negative for weakness and headaches.    Endo/Heme/Allergies: Negative.  Does not bruise/bleed easily.  Psychiatric/Behavioral: Negative.  Negative for depression. The patient is not nervous/anxious and does not have insomnia.      Current treatment-Rituxan  No Known Allergies  Past Medical History:  Diagnosis Date  . Celiac disease   . 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 HIV (human immunodeficiency virus infection) ([place])   . Lyme disease   . Lymphoma [facility])     Past Surgical History:  Procedure Laterality Date  . PORTA CATH INSERTION N/A 04/25/2020   Procedure: PORTA CATH INSERTION;  Surgeon: Algernon Huxley, MD;  Location: [place] [facility];  Service: Cardiovascular;  Laterality: N/A;    Social History   Socioeconomic History  . Marital status: Married    Spouse name: Justin Wallace   . Number of children: 2  . Years of education: Not on file  . Highest education level: Not on file  Occupational History  . Not on file  Tobacco Use  . Smoking status: Never Smoker  . Smokeless tobacco: Never Used  Vaping Use  . Vaping Use: Never used  Substance and Sexual Activity  . Alcohol use: Yes    Comment: occasional  . Drug use: Never  . Sexual activity: Never  Other Topics Concern  . Not on file  Social History Narrative  . Not on file   Social Determinants of Health   Financial Resource Strain:   . Difficulty of Paying Living Expenses: Not on file  Food Insecurity:   . Worried About Charity fundraiser in the Last Year: Not on file  . Ran Out of Food in the Last Year: Not on file  Transportation Needs:   . Lack of Transportation (Medical): Not on file  . Lack of Transportation (Non-Medical): Not on file  Physical Activity:   . Days of Exercise per Week: Not on file  . Minutes of Exercise per Session: Not on file  Stress:   . Feeling of Stress : Not on file  Social Connections:   . Frequency of Communication with Friends and Family: Not on file  . Frequency of Social Gatherings with Friends and  Family: Not on file  . Attends Religious Services: Not on file  . Active Member of Clubs or Organizations: Not on file  . Attends Archivist Meetings: Not on file  . Marital Status: Not on file  Intimate Partner Violence:   . Fear of Current or Ex-Partner: Not on file  . Emotionally Abused: Not on file  . Physically Abused: Not on file  . Sexually Abused: Not on file    Family History  Problem Relation Age of Onset  . Hyperlipidemia Mother   . Brain cancer Father      Current Outpatient Medications:  .  bictegravir-emtricitabine-tenofovir AF (BIKTARVY) 50-200-25 MG TABS tablet, Take 1 tablet by mouth daily., Disp: 30 tablet, Rfl: 5 .  ferrous sulfate 325 (65 FE) MG EC tablet, Take 325 mg by mouth daily with breakfast. , Disp: , Rfl:  .  Multiple Vitamin (THERA) TABS, Take 1 tablet by mouth daily., Disp: , Rfl:  .  sulfamethoxazole-trimethoprim (BACTRIM DS) 800-160 MG tablet, Take 1 tablet by mouth daily., Disp: 30 tablet, Rfl: 3  Physical exam: There were no vitals filed for this visit.  Physical Exam Constitutional:      Appearance: Normal appearance.  HENT:     Head: Normocephalic and atraumatic.  Eyes:     Pupils: Pupils are equal, round, and reactive to light.  Cardiovascular:     Rate and Rhythm: Normal rate and regular rhythm.     Heart sounds: Normal heart sounds. No murmur heard.   Pulmonary:     Effort: Pulmonary effort is normal.     Breath sounds: Normal breath sounds. No wheezing.  Abdominal:     General: Bowel sounds are normal. There is no distension.     Palpations: Abdomen is soft.     Tenderness: There is no abdominal tenderness.  Musculoskeletal:        General: Normal range of motion.     Cervical back: Normal range of motion.  Skin:    General: Skin is warm and dry.     Findings: No rash.  Neurological:     Mental Status: He is alert and oriented to person, place, and time.  Psychiatric:        Judgment: Judgment normal.      CMP  Latest Ref Rng & Units 05/05/2020  Glucose 65 - 99 mg/dL 97  BUN 7 - 25 mg/dL 19  Creatinine 0.70 - 1.18 mg/dL 0.88  Sodium 135 - 146 mmol/L 139  Potassium 3.5 - 5.3 mmol/L 4.4  Chloride 98 - 110 mmol/L 107  CO2 20 - 32 mmol/L 27  Calcium 8.6 - 10.3 mg/dL 9.1  Total Protein 6.1 - 8.1 g/dL 7.4  Total Bilirubin 0.2 - 1.2 mg/dL 0.5  Alkaline Phos 38 - 126 U/L -  AST 10 - 35 U/L 15  ALT 9 - 46 U/L 8(L)   CBC Latest Ref Rng & Units 05/05/2020  WBC 3.8 - 10.8 Thousand/uL 3.0(L)  Hemoglobin 13.2 - 17.1 g/dL 12.4(L)  Hematocrit 38 - 50 % 38.6  Platelets 140 - 400 Thousand/uL 211    No images are attached to the encounter.  CT BONE MARROW BIOPSY & ASPIRATION  Result Date: 05/02/2020 INDICATION: 74 year old male with recent diagnosis of high-grade lymphoma. He presents for CT-guided bone marrow aspiration and core biopsy for staging. EXAM: CT GUIDED BONE MARROW ASPIRATION AND CORE BIOPSY Interventional Radiologist:  Criselda Peaches, MD MEDICATIONS: None. ANESTHESIA/SEDATION: Moderate (conscious) sedation was employed during this procedure. A total of 2 milligrams versed and 100 micrograms fentanyl were administered intravenously. The patient's level of consciousness and vital signs were monitored continuously by radiology nursing throughout the procedure under my direct supervision. Total monitored sedation time: 8 minutes FLUOROSCOPY TIME:  None COMPLICATIONS: None immediate. Estimated blood loss: <25 mL PROCEDURE: Informed written consent was obtained from the patient after a thorough discussion of the procedural risks, benefits and alternatives. All questions were addressed. Maximal Sterile Barrier Technique was utilized including caps, mask, sterile gowns, sterile gloves, sterile drape, hand hygiene and skin antiseptic. A timeout was performed prior to the initiation of the procedure. The patient was positioned prone and non-contrast localization CT was performed of the pelvis to demonstrate  the iliac marrow spaces. Maximal barrier sterile technique utilized including caps, mask, sterile gowns, sterile gloves, large sterile drape, hand hygiene, and betadine prep. Under sterile conditions and local anesthesia, an 11 gauge coaxial bone biopsy needle was advanced into the right iliac marrow space. Needle position was confirmed with CT imaging. Initially, bone marrow aspiration was performed. Next, the 11 gauge outer cannula was utilized to obtain a right iliac bone marrow  core biopsy. Needle was removed. Hemostasis was obtained with compression. The patient tolerated the procedure well. Samples were prepared with the cytotechnologist. IMPRESSION: Technically successful CT-guided iliac bone marrow aspiration and core biopsy. Electronically Signed   By: Jacqulynn Cadet M.D.   On: 05/02/2020 10:06   ECHOCARDIOGRAM LIMITED  Result Date: 05/13/2020    ECHOCARDIOGRAM LIMITED REPORT   Patient Name:   NAHUEL Kluth  Date of Exam: 05/13/2020 Medical Rec #:  [id number]  Height:       69.0 in Accession #:    [id number] Weight:       147.0 lb Date of Birth:  03-30-1946  BSA:          1.813 m Patient Age:    36 years   BP:           93/63 mmHg Patient Gender: M          HR:           60 bpm. Exam Location:  [facility] Procedure: 2D Echo, Cardiac Doppler and Color Doppler Indications:     Chemotherapy evaluation V 87.41  History:         Patient has no prior history of Echocardiogram examinations. No                  heart history listed in chart.  Sonographer:     Sherrie Sport RDCS (AE) Referring Phys:  [id number] ZHOU YU Diagnosing Phys: Bartholome Bill MD IMPRESSIONS  1. Left ventricular ejection fraction, by estimation, is 50 to 55%. The left ventricle has low normal function. Left ventricular diastolic parameters were normal.  2. Right ventricular systolic function is normal. The right ventricular size is mildly enlarged.  3. Right atrial size was mildly dilated.  4. The mitral valve is grossly normal. Mild mitral valve  regurgitation.  5. The aortic valve is tricuspid. Aortic valve regurgitation is mild. FINDINGS  Left Ventricle: Left ventricular ejection fraction, by estimation, is 50 to 55%. The left ventricle has low normal function. The left ventricular internal cavity size was normal in size. There is no left ventricular hypertrophy. Left ventricular diastolic parameters were normal. Right Ventricle: The right ventricular size is mildly enlarged. No increase in right ventricular wall thickness. Right ventricular systolic function is normal. Left Atrium: Left atrial size was normal in size. Right Atrium: Right atrial size was mildly dilated. Pericardium: There is no evidence of pericardial effusion. Mitral Valve: The mitral valve is grossly normal. Mild mitral valve regurgitation. Tricuspid Valve: The tricuspid valve is grossly normal. Tricuspid valve regurgitation is mild. Aortic Valve: The aortic valve is tricuspid. Aortic valve regurgitation is mild. Aortic regurgitation PHT measures 499 msec. Aortic valve mean gradient measures 3.0 mmHg. Aortic valve peak gradient measures 5.1 mmHg. Aortic valve area, by VTI measures 2.56 cm. Pulmonic Valve: The pulmonic valve was not well visualized. Pulmonic valve regurgitation is mild. Aorta: The aortic root is normal in size and structure. IAS/Shunts: The interatrial septum was not assessed. LEFT VENTRICLE PLAX 2D LVIDd:         5.08 cm  Diastology LVIDs:         3.55 cm  LV e' medial:    7.07 cm/s LV PW:         1.02 cm  LV E/e' medial:  8.7 LV IVS:        0.77 cm  LV e' lateral:   4.03 cm/s LVOT diam:     2.10 cm  LV E/e' lateral: 15.2 LV  SV:         56 LV SV Index:   31 LVOT Area:     3.46 cm  RIGHT VENTRICLE RV Basal diam:  3.86 cm RV S prime:     12.70 cm/s TAPSE (M-mode): 3.5 cm LEFT ATRIUM             Index       RIGHT ATRIUM           Index LA diam:        3.30 cm 1.82 cm/m  RA Area:     19.00 cm LA Vol (A2C):   52.2 ml 28.80 ml/m RA Volume:   54.60 ml  30.12 ml/m LA Vol  (A4C):   22.9 ml 12.63 ml/m LA Biplane Vol: 36.4 ml 20.08 ml/m  AORTIC VALVE                   PULMONIC VALVE AV Area (Vmax):    2.03 cm    PV Vmax:        0.69 m/s AV Area (Vmean):   2.01 cm    PV Peak grad:   1.9 mmHg AV Area (VTI):     2.56 cm    RVOT Peak grad: 2 mmHg AV Vmax:           112.50 cm/s AV Vmean:          78.500 cm/s AV VTI:            0.218 m AV Peak Grad:      5.1 mmHg AV Mean Grad:      3.0 mmHg LVOT Vmax:         65.90 cm/s LVOT Vmean:        45.500 cm/s LVOT VTI:          0.161 m LVOT/AV VTI ratio: 0.74 AI PHT:            499 msec  AORTA Ao Root diam: 3.00 cm MITRAL VALVE               TRICUSPID VALVE MV Area (PHT): 3.93 cm    TR Peak grad:   28.9 mmHg MV Decel Time: 193 msec    TR Vmax:        269.00 cm/s MV E velocity: 61.30 cm/s MV A velocity: 66.80 cm/s  SHUNTS MV E/A ratio:  0.92        Systemic VTI:  0.16 m                            Systemic Diam: 2.10 cm Bartholome Bill MD Electronically signed by Bartholome Bill MD Signature Date/Time: 05/13/2020/12:58:21 PM    Final      Assessment and plan- Patient is a 74 y.o. male who presents to [facility] for initial meeting in preparation for starting chemotherapy for the treatment of B-cell lymphoma.   1. HPI: Mr. Fischel is a 74 year old male with past medical history significant for celiac disease, HIV and Lyme disease who recently presented for symptomatic anemia, body aches, decreased appetite, headache, low-grade fever for about 2 weeks.  He presented to his PCP where blood work showed a hemoglobin of 13.3, decreased iron saturations at 8%, ferritin 218 and he was referred to hematology for anemia.  On assessment he was noted to have left axillary lymphadenopathy.  Imaging showed soft tissue mass enlarged lymph nodes in the left axilla 6.3 cm in size concerning  for primary or metastatic lymphadenopathy.  Had biopsy which revealed high-grade B-cell lymphoma with Burkitt morphology.  Plan is for repeat bone marrow, MUGA and port  placement.  Patient will proceed with R-CHOP or R-EPOCH.  He will have to finish treatment for his Lyme disease prior to initiating chemo.  Plan is to begin in the next couple weeks.  2. Chemo Care Clinic/High Risk for ER/Hospitalization during chemotherapy- We discussed the role of the chemo care clinic and identified patient specific risk factors. I discussed that patient was identified as high risk primarily based on: Stage of disease  Patient has past medical history positive for: Past Medical History:  Diagnosis Date  . Celiac disease   . 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 HIV (human immunodeficiency virus infection) ([place])   . Lyme disease   . Lymphoma [facility])     Patient has past surgical history positive for: Past Surgical History:  Procedure Laterality Date  . PORTA CATH INSERTION N/A 04/25/2020   Procedure: PORTA CATH INSERTION;  Surgeon: Algernon Huxley, MD;  Location: [place] [facility];  Service: Cardiovascular;  Laterality: N/A;    Based on our high risk symptom management report; this patient has a high risk of ED utilization.  The percentage below indicates how "at risk "  this patient based on the factors in this table within one year.   General Risk Score: 2  Values used to calculate this score:   Points  Metrics      1        Age: 82      [facility] Admissions: 1      0        ED Visits: 0      0        Has Chronic Obstructive Pulmonary Disease: No      0        Has Diabetes: No      0        Has Congestive Heart Failure: No      0        Has liver disease: No      0        Has Depression: No      0        Current PCP: Justin Hauser, DO      0        Has Medicaid: No    3. We discussed that social determinants of health may have significant impacts on health and outcomes for cancer patients.  Today we discussed specific social determinants of performance status, alcohol use, depression, financial needs, food insecurity, housing,  interpersonal violence, social connections, stress, tobacco use, and transportation.    After lengthy discussion the following were identified as areas of need: None at this time  Outpatient services: We discussed options including home based and outpatient services, DME and care program. We discusssed that patients who participate in regular physical activity report fewer negative impacts of cancer and treatments and report less fatigue.   Financial Concerns: We discussed that living with cancer can create tremendous financial burden.  We discussed options for assistance. I asked that if assistance is needed in affording medications or paying bills to please let us know so that we can provide assistance. We discussed options for food including social services, [employer] ($50 every 2 weeks) and onsite food pantry.  We will also notify Elease Etienne  to see if cancer center can provide additional support.  Referral to Social work: Introduced Education officer, museum Elease Etienne and the services he can provide such as support with [employer], cell phone and gas vouchers.   Support groups: We discussed options for support groups at the cancer center. If interested, please notify nurse navigator to enroll. We discussed options for managing stress including healthy eating, exercise as well as participating in no charge counseling services at the cancer center and support groups.  If these are of interest, patient can notify either myself or primary nursing team.We discussed options for management including medications and referral to quit Smart program  Transportation: We discussed options for transportation including acta, paratransit, bus routes, link transit, taxi/uber/lyft, and cancer center [place].  I have notified primary oncology team who will help assist with arranging Lucianne Lei transportation for appointments when/if needed. We also discussed options for transportation on short notice/acute  visits.  Palliative care services: We have palliative care services available in the cancer center to discuss goals of care and advanced care planning.  Please let us know if you have any questions or would like to speak to our palliative nurse practitioner.  Symptom Management Clinic: We discussed our symptom management clinic which is available for acute concerns while receiving treatment such as nausea, vomiting or diarrhea.  We can be reached via telephone at [phone] or through my chart.  We are available for virtual or in person visits on the same day from 830 to 4 PM Monday through Friday. He denies needing specific assistance at this time and He will be followed by Dr. Tasia Catchings clinical team.  Plan: Discussed symptom management clinic. Discussed palliative care services. Discussed resources that are available here at the cancer center. Discussed medications and new prescriptions to begin treatment such as anti-nausea or steroids.   Disposition: RTC on 06/07/2020 for cardiology appointment. RTC on 06/14/20 for repeat bone marrow.   Visit Diagnosis No diagnosis found.  Patient expressed understanding and was in agreement with this plan. He also understands that He can call clinic at any time with any questions, concerns, or complaints.   Greater than 50% was spent in counseling and coordination of care with this patient including but not limited to discussion of the relevant topics above (See A&P) including, but not limited to diagnosis and management of acute and chronic medical conditions.   [facility]  CC: Dr. Tasia Catchings

## 2020-05-31 NOTE — Telephone Encounter (Signed)
Pt has been scheduled for intrathecal methotrexate on 11/16 @ 9:30, arrive @ 8:30.

## 2020-06-01 MED ORDER — LIDOCAINE-PRILOCAINE 2.5-2.5 % EX CREA: 1.0000 "application " | TOPICAL_CREAM | CUTANEOUS | 1 refills | Status: DC | PRN

## 2020-06-03 ENCOUNTER — Ambulatory Visit: Payer: Medicare HMO | Admitting: Internal Medicine

## 2020-06-03 ENCOUNTER — Other Ambulatory Visit: Payer: Self-pay

## 2020-06-03 DIAGNOSIS — C8510 Unspecified B-cell lymphoma, unspecified site: Secondary | ICD-10-CM

## 2020-06-06 ENCOUNTER — Encounter: Payer: Self-pay | Admitting: Oncology

## 2020-06-06 ENCOUNTER — Inpatient Hospital Stay: Payer: Medicare HMO

## 2020-06-06 ENCOUNTER — Other Ambulatory Visit: Payer: Self-pay

## 2020-06-06 ENCOUNTER — Inpatient Hospital Stay (HOSPITAL_BASED_OUTPATIENT_CLINIC_OR_DEPARTMENT_OTHER): Payer: Medicare HMO | Admitting: Oncology

## 2020-06-06 VITALS — BP 110/66 | HR 54 | Temp 96.6°F | Resp 16 | Wt 162.5 lb

## 2020-06-06 VITALS — BP 95/55 | HR 55 | Temp 96.7°F

## 2020-06-06 DIAGNOSIS — C8510 Unspecified B-cell lymphoma, unspecified site: Secondary | ICD-10-CM

## 2020-06-06 DIAGNOSIS — Z5112 Encounter for antineoplastic immunotherapy: Secondary | ICD-10-CM | POA: Diagnosis not present

## 2020-06-06 DIAGNOSIS — Z5189 Encounter for other specified aftercare: Secondary | ICD-10-CM | POA: Diagnosis not present

## 2020-06-06 DIAGNOSIS — C8512 Unspecified B-cell lymphoma, intrathoracic lymph nodes: Secondary | ICD-10-CM | POA: Diagnosis not present

## 2020-06-06 DIAGNOSIS — B20 Human immunodeficiency virus [HIV] disease: Secondary | ICD-10-CM | POA: Diagnosis not present

## 2020-06-06 DIAGNOSIS — K900 Celiac disease: Secondary | ICD-10-CM | POA: Diagnosis not present

## 2020-06-06 DIAGNOSIS — Z5111 Encounter for antineoplastic chemotherapy: Secondary | ICD-10-CM | POA: Diagnosis not present

## 2020-06-06 DIAGNOSIS — D649 Anemia, unspecified: Secondary | ICD-10-CM | POA: Diagnosis not present

## 2020-06-06 DIAGNOSIS — C8514 Unspecified B-cell lymphoma, lymph nodes of axilla and upper limb: Secondary | ICD-10-CM | POA: Diagnosis not present

## 2020-06-06 LAB — PHOSPHORUS: Phosphorus: 2 mg/dL — ABNORMAL LOW (ref 2.5–4.6)

## 2020-06-06 LAB — COMPREHENSIVE METABOLIC PANEL
ALT: 14 U/L (ref 0–44)
AST: 21 U/L (ref 15–41)
Albumin: 3.5 g/dL (ref 3.5–5.0)
Alkaline Phosphatase: 82 U/L (ref 38–126)
Anion gap: 5 (ref 5–15)
BUN: 29 mg/dL — ABNORMAL HIGH (ref 8–23)
CO2: 24 mmol/L (ref 22–32)
Calcium: 9 mg/dL (ref 8.9–10.3)
Chloride: 108 mmol/L (ref 98–111)
Creatinine, Ser: 0.96 mg/dL (ref 0.61–1.24)
GFR, Estimated: >60 mL/min (ref >60)
Glucose, Bld: 97 mg/dL (ref 70–99)
Potassium: 4.1 mmol/L (ref 3.5–5.1)
Sodium: 137 mmol/L (ref 135–145)
Total Bilirubin: 0.5 mg/dL (ref 0.3–1.2)
Total Protein: 7.9 g/dL (ref 6.5–8.1)

## 2020-06-06 LAB — CBC WITH DIFFERENTIAL/PLATELET
Abs Immature Granulocytes: 0.05 10*3/uL (ref 0.00–0.07)
Basophils Absolute: 0.1 10*3/uL (ref 0.0–0.1)
Basophils Relative: 2 %
Eosinophils Absolute: 0.1 10*3/uL (ref 0.0–0.5)
Eosinophils Relative: 3 %
HCT: 36.5 % — ABNORMAL LOW (ref 39.0–52.0)
Hemoglobin: 12.4 g/dL — ABNORMAL LOW (ref 13.0–17.0)
Immature Granulocytes: 1 %
Lymphocytes Relative: 24 %
Lymphs Abs: 1 10*3/uL (ref 0.7–4.0)
MCH: 29.4 pg (ref 26.0–34.0)
MCHC: 34 g/dL (ref 30.0–36.0)
MCV: 86.5 fL (ref 80.0–100.0)
Monocytes Absolute: 0.6 10*3/uL (ref 0.1–1.0)
Monocytes Relative: 14 %
Neutro Abs: 2.3 10*3/uL (ref 1.7–7.7)
Neutrophils Relative %: 56 %
Platelets: 246 10*3/uL (ref 150–400)
RBC: 4.22 MIL/uL (ref 4.22–5.81)
RDW: 15.9 % — ABNORMAL HIGH (ref 11.5–15.5)
WBC: 4 10*3/uL (ref 4.0–10.5)
nRBC: 0 % (ref 0.0–0.2)

## 2020-06-06 LAB — URIC ACID: Uric Acid, Serum: 7.1 mg/dL (ref 3.7–8.6)

## 2020-06-06 MED ORDER — SODIUM CHLORIDE 0.9 % IV SOLN
Freq: Once | INTRAVENOUS | Status: AC
  Filled 2020-06-06: qty 250

## 2020-06-06 MED ORDER — PROCHLORPERAZINE MALEATE 10 MG PO TABS: 10.0000 mg | ORAL_TABLET | Freq: Four times a day (QID) | ORAL | 1 refills | Status: DC | PRN

## 2020-06-06 MED ORDER — PALONOSETRON HCL INJECTION 0.25 MG/5ML
0.2500 mg | Freq: Once | INTRAVENOUS | Status: AC
  Administered 2020-06-06: 0.25 mg via INTRAVENOUS
  Filled 2020-06-06: qty 5

## 2020-06-06 MED ORDER — DIPHENHYDRAMINE HCL 25 MG PO CAPS
50.0000 mg | ORAL_CAPSULE | Freq: Once | ORAL | Status: AC
  Administered 2020-06-06: 50 mg via ORAL
  Filled 2020-06-06: qty 2

## 2020-06-06 MED ORDER — LIDOCAINE-PRILOCAINE 2.5-2.5 % EX CREA: TOPICAL_CREAM | CUTANEOUS | 3 refills | Status: DC

## 2020-06-06 MED ORDER — SODIUM CHLORIDE 0.9 % IV SOLN
20.0000 mg | Freq: Once | INTRAVENOUS | Status: AC
  Administered 2020-06-06: 20 mg via INTRAVENOUS
  Filled 2020-06-06: qty 20

## 2020-06-06 MED ORDER — SODIUM CHLORIDE 0.9 % IV SOLN
375.0000 mg/m2 | Freq: Once | INTRAVENOUS | Status: AC
  Administered 2020-06-06: 700 mg via INTRAVENOUS
  Filled 2020-06-06: qty 50

## 2020-06-06 MED ORDER — ONDANSETRON HCL 8 MG PO TABS: 8.0000 mg | ORAL_TABLET | Freq: Two times a day (BID) | ORAL | 1 refills | Status: DC | PRN

## 2020-06-06 MED ORDER — PREDNISONE 20 MG PO TABS: 60.0000 mg/m2 | ORAL_TABLET | Freq: Every day | ORAL | 6 refills | Status: DC

## 2020-06-06 MED ORDER — ALLOPURINOL 300 MG PO TABS: 300.0000 mg | ORAL_TABLET | Freq: Two times a day (BID) | ORAL | 0 refills | Status: DC

## 2020-06-06 MED ORDER — ACYCLOVIR 400 MG PO TABS: 400.0000 mg | ORAL_TABLET | Freq: Two times a day (BID) | ORAL | 5 refills | Status: DC

## 2020-06-06 MED ORDER — ACETAMINOPHEN 325 MG PO TABS
650.0000 mg | ORAL_TABLET | Freq: Once | ORAL | Status: AC
  Administered 2020-06-06: 650 mg via ORAL
  Filled 2020-06-06: qty 2

## 2020-06-06 MED ORDER — VINCRISTINE SULFATE CHEMO INJECTION 1 MG/ML
Freq: Once | INTRAVENOUS | Status: DC
  Filled 2020-06-06: qty 9

## 2020-06-06 MED ORDER — SULFAMETHOXAZOLE-TRIMETHOPRIM 800-160 MG PO TABS: 1.0000 | ORAL_TABLET | ORAL | 5 refills | Status: DC

## 2020-06-06 NOTE — Progress Notes (Signed)
Hematology/Oncology follow up note [facility] Telephone:([phone] Fax:([phone]) [phone]   Patient Care Team: Olin Hauser, DO as PCP - General (Family Medicine)  REFERRING PROVIDER: Nobie Putnam *  CHIEF COMPLAINTS/REASON FOR VISIT:  Follow up for axillary mass  HISTORY OF PRESENTING ILLNESS:   Justin Wallace is a  74 y.o.  male with PMH listed below was seen in consultation at the request of  Nobie Putnam *  for evaluation of symptomatic anemia Patient reported history of sudden onset of diffuse body aches, decreased appetite, headache low-grade fever, profound weakness/fatigue about 2 weeks ago.  Prior to the onset of symptoms, he went to a funeral as well as applicable clinic.  He reports that he has received COVID-19 vaccination previously.  He moved from [place].  Daughter is an Therapist, sports and told him to get COVID-19 PCR checked and he was tested negative. Patient also reports a sudden drop of weight since the onset of symptoms.  Patient has a history of celiac disease.  No colonoscopy records or pathology records in current EMR.  Diagnosis was done by [facility].  Patient reports that he was in his usual state of health until the onset of symptoms.  03/10/2020, patient was evaluated by primary care provider.  Blood work showed mild anemia with hemoglobin of 13.1, Iron panel showed decreased saturation of 8, ferritin 218, TIBC 291, patient was referred to hematology for evaluation of symptomatic anemia and iron deficiency.  Patient reports that her body aches, fever and headache symptoms have improved.  However he continues to feel very tired and fatigued.  Appetite has decreased and weight remains low.  # Physical examination showed left axillary mass  ultrasound confirmed a left axillary large soft tissue mass up to 6.8 cm with internal vascularity. 03/30/2020 CT chest with contrast showed soft tissue mass/enlarged lymph  node in the left axilla 6.3 cm, concerning for primary mass or metastatic lymphadenopathy.  There are additional prominent although much smaller right axillary lymph nodes, pretracheal and superior mediastinal lymph nodes.  Findings are concerning for malignancy such as lymphoma or nodal metastatic disease.  # treated for Lymes disease, he is on doxycycline 160m BID for 21 days course.  INTERVAL HISTORY Justin Matuszakis a 74y.o. 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 he has started treatment with Biktarvy, also started on Bactrim daily.  04/26/2020 PET scan showed large intensely hypermetabolic left axillary mass SUV 28.  Additional small bilateral axilla lymph node with mild metabolic activity SUV 2.  Metabolic activity through the porta hepatis region without enlarged nodes identified.  No bone lesions. 05/02/2020 bone marrow biopsy showed hypercellular marrow for age with trilineage hematopoiesis.  Increased number of CD10 positive B cells, cannot rule out minimal or subtle involvement of marrow by the high-grade B-cell lymphoma. Normal cytogenetics. 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  Positive for BCL6 and MYC expression.  FISH testing showed negative for BCL-2/BCL6/MYC gene rearrangement.   Trisomy 3 and 18 were identified and are nonspecific findings seen in clonal lymphoid neoplasm.  Final results were signed out on 05/30/2020-due to the delay of  FISH results.  B sed on the FISH results, lymphoma is best classified as high-grade B-cell lymphoma not otherwise specified.  It does not meet morphological criteria for diffuse large B-cell  lymphoma nor molecular criteria for BRCA lymphoma.  11 q. alteration testing has been sent.  Results are pending. 04/26/2020, MUGA testing showed normal LVEF  of 56% with normal LV wall motion.  05/13/2020, echocardiogram showed low normal end ejection fraction of 50 to 55%.  I have communicated with patient about his PET scan and the final pathology results and plan of systemic chemotherapy treatments. He presents for evaluation prior to cycle 1 chemotherapy treatments. He has gained weight since last visit.  He is compliant with HIV treatments.  No unintentional weight loss, night sweats fever or chills.  Intermittently he has some pain of the left axillary mass.  Patient informs me that he has discussed his AIDS diagnosis with his wife and wife has been supportive.    Review of Systems  Constitutional: Positive for fatigue. Negative for appetite change, chills, fever and unexpected weight change.  HENT:   Negative for hearing loss and voice change.   Eyes: Negative for eye problems and icterus.  Respiratory: Negative for chest tightness, cough and shortness of breath.   Cardiovascular: Negative for chest pain and leg swelling.  Gastrointestinal: Negative for abdominal distention and abdominal pain.  Endocrine: Negative for hot flashes.  Genitourinary: Negative for difficulty urinating, dysuria and frequency.   Musculoskeletal: Negative for arthralgias.       Left axillary mass pain  Skin: Negative for itching and rash.  Neurological: Negative for light-headedness and numbness.  Hematological: Negative for adenopathy. Does not bruise/bleed easily.  Psychiatric/Behavioral: Negative for confusion.    MEDICAL HISTORY:  Past Medical History:  Diagnosis Date  . Celiac disease   . 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 HIV (human immunodeficiency virus infection) ([place])   . Lyme disease   . Lymphoma ([place])     SURGICAL HISTORY: Past Surgical History:  Procedure Laterality Date  . PORTA CATH INSERTION N/A 04/25/2020   Procedure: PORTA CATH INSERTION;  Surgeon: Algernon Huxley, MD;  Location: [place] [facility];  Service:  Cardiovascular;  Laterality: N/A;    SOCIAL HISTORY: Social History   Socioeconomic History  . Marital status: Married    Spouse name: Opal Sidles   . Number of children: 2  . Years of education: Not on file  . Highest education level: Not on file  Occupational History  . Not on file  Tobacco Use  . Smoking status: Never Smoker  . Smokeless tobacco: Never Used  Vaping Use  . Vaping Use: Never used  Substance and Sexual Activity  . Alcohol use: Yes    Comment: occasional  . Drug use: Never  . Sexual activity: Never  Other Topics Concern  . Not on file  Social History Narrative  . Not on file   Social Determinants of Health   Financial Resource Strain:   . Difficulty of Paying Living Expenses: Not on file  Food Insecurity:   . Worried About Charity fundraiser in the Last Year: Not on file  . Ran Out of Food in the Last Year: Not on file  Transportation Needs:   . Lack of Transportation (Medical): Not on file  . Lack of Transportation (Non-Medical): Not on file  Physical Activity:   . Days of Exercise per Week: Not on file  . Minutes of Exercise per Session: Not on file  Stress:   . Feeling of Stress : Not on file  Social Connections:   . Frequency of Communication with Friends and Family: Not on file  . Frequency  of Social Gatherings with Friends and Family: Not on file  . Attends Religious Services: Not on file  . Active Member of Clubs or Organizations: Not on file  . Attends Archivist Meetings: Not on file  . Marital Status: Not on file  Intimate Partner Violence:   . Fear of Current or Ex-Partner: Not on file  . Emotionally Abused: Not on file  . Physically Abused: Not on file  . Sexually Abused: Not on file    FAMILY HISTORY: Family History  Problem Relation Age of Onset  . Hyperlipidemia Mother   . Brain cancer Father     ALLERGIES:  has No Known Allergies.  MEDICATIONS:  Current Outpatient Medications  Medication Sig Dispense Refill  .  acyclovir (ZOVIRAX) 400 MG tablet Take 1 tablet (400 mg total) by mouth 2 (two) times daily. 60 tablet 5  . bictegravir-emtricitabine-tenofovir AF (BIKTARVY) 50-200-25 MG TABS tablet Take 1 tablet by mouth daily. 30 tablet 5  . lidocaine-prilocaine (EMLA) cream Apply to affected area once 30 g 3  . magic mouthwash w/lidocaine SOLN Take 5 mLs by mouth 4 (four) times daily as needed for mouth pain. Sig: Swish/spit 5-10 ml four times a day as needed 480 mL 1  . sulfamethoxazole-trimethoprim (BACTRIM DS) 800-160 MG tablet Take 1 tablet by mouth daily. 30 tablet 3  . ferrous sulfate 325 (65 FE) MG EC tablet Take 325 mg by mouth daily with breakfast.  (Patient not taking: Reported on 06/06/2020)    . Multiple Vitamin (THERA) TABS Take 1 tablet by mouth daily. (Patient not taking: Reported on 06/06/2020)    . ondansetron (ZOFRAN) 8 MG tablet Take 1 tablet (8 mg total) by mouth 2 (two) times daily as needed for refractory nausea / vomiting. Start on the third day after chemotherapy. (Patient not taking: Reported on 06/06/2020) 30 tablet 1  . predniSONE (DELTASONE) 20 MG tablet Take 5.5 tablets (110 mg total) by mouth daily before breakfast. Daily for from Day 2 -Day 5 (Patient not taking: Reported on 06/06/2020) 22 tablet 6  . prochlorperazine (COMPAZINE) 10 MG tablet Take 1 tablet (10 mg total) by mouth every 6 (six) hours as needed (Nausea or vomiting). (Patient not taking: Reported on 06/06/2020) 30 tablet 1   No current facility-administered medications for this visit.   Facility-Administered Medications Ordered in Other Visits  Medication Dose Route Frequency Provider Last Rate Last Admin  . DOXOrubicin (ADRIAMYCIN) 18 mg, etoposide (VEPESID) 94 mg, vinCRIStine (ONCOVIN) 0.8 mg in sodium chloride 0.9 % 500 mL chemo infusion   Intravenous Once Earlie Server, MD      . palonosetron (ALOXI) injection 0.25 mg  0.25 mg Intravenous Once Earlie Server, MD         PHYSICAL EXAMINATION: ECOG PERFORMANCE STATUS: 1 -  Symptomatic but completely ambulatory Vitals:   06/06/20 0843  BP: 110/66  Pulse: (!) 54  Resp: 16  Temp: (!) 96.6 F (35.9 C)   Filed Weights   06/06/20 0843  Weight: 162 lb 8 oz (73.7 kg)    Physical Exam Constitutional:      General: He is not in acute distress. HENT:     Head: Normocephalic and atraumatic.  Eyes:     General: No scleral icterus. Cardiovascular:     Rate and Rhythm: Normal rate and regular rhythm.     Heart sounds: Normal heart sounds.  Pulmonary:     Effort: Pulmonary effort is normal. No respiratory distress.     Breath sounds: No  wheezing.  Abdominal:     General: Bowel sounds are normal. There is no distension.     Palpations: Abdomen is soft.  Musculoskeletal:        General: No deformity. Normal range of motion.     Cervical back: Normal range of motion and neck supple.     Comments: Left axillary mass  Skin:    General: Skin is warm and dry.     Findings: No erythema or rash.  Neurological:     Mental Status: He is alert and oriented to person, place, and time. Mental status is at baseline.     Cranial Nerves: No cranial nerve deficit.     Coordination: Coordination normal.  Psychiatric:        Mood and Affect: Mood normal.     LABORATORY DATA:  I have reviewed the data as listed Lab Results  Component Value Date   WBC 4.0 06/06/2020   HGB 12.4 (L) 06/06/2020   HCT 36.5 (L) 06/06/2020   MCV 86.5 06/06/2020   PLT 246 06/06/2020   Recent Labs    03/10/20 1113 03/10/20 1113 03/17/20 1029 05/05/20 0956 06/06/20 0822  NA 138   < > 137 139 137  K 4.8   < > 3.8 4.4 4.1  CL 106   < > 104 107 108  CO2 27   < > _0 GLUCOSE 94   < > 97 97 97  BUN 23   < > 21 19 29*  CREATININE 0.91  --  1.09 0.88 0.96  CALCIUM 8.4*   < > 8.1* 9.1 9.0  GFRNONAA 84  --  >60 86 >60  GFRAA 98  --  >60 100  --   PROT 6.8   < > 8.0 7.4 7.9  ALBUMIN  --   --  2.7*  --  3.5  AST 20   < > _1 ALT 13   < > 18 8* 14  ALKPHOS  --   --  140*   --  82  BILITOT 0.4   < > 0.3 0.5 0.5   < > = values in this interval not displayed.   Iron/TIBC/Ferritin/ %Sat    Component Value Date/Time   IRON 22 (L) 03/17/2020 1029   IRON 17 02/17/2018 0000   TIBC 286 03/17/2020 1029   TIBC 425 02/17/2018 0000   FERRITIN 284 03/17/2020 1029   IRONPCTSAT 8 (L) 03/17/2020 1029   IRONPCTSAT 8 (L) 03/10/2020 1113      RADIOGRAPHIC STUDIES: I have personally reviewed the radiological images as listed and agreed with the findings in the report. ECHOCARDIOGRAM LIMITED  Result Date: 05/13/2020    ECHOCARDIOGRAM LIMITED REPORT   Patient Name:   YIANNIS TULLOCH  Date of Exam: 05/13/2020 Medical Rec #:  [id number]  Height:       69.0 in Accession #:    [id number] Weight:       147.0 lb Date of Birth:  Oct 04, 1945  BSA:          1.813 m Patient Age:    19 years   BP:           93/63 mmHg Patient Gender: M          HR:           60 bpm. Exam Location:  [facility] Procedure: 2D Echo, Cardiac Doppler and Color Doppler Indications:     Chemotherapy evaluation V 87.41  History:  Patient has no prior history of Echocardiogram examinations. No                  heart history listed in chart.  Sonographer:     Sherrie Sport RDCS (AE) Referring Phys:  [id number]   Diagnosing Phys: Bartholome Bill MD IMPRESSIONS  1. Left ventricular ejection fraction, by estimation, is 50 to 55%. The left ventricle has low normal function. Left ventricular diastolic parameters were normal.  2. Right ventricular systolic function is normal. The right ventricular size is mildly enlarged.  3. Right atrial size was mildly dilated.  4. The mitral valve is grossly normal. Mild mitral valve regurgitation.  5. The aortic valve is tricuspid. Aortic valve regurgitation is mild. FINDINGS  Left Ventricle: Left ventricular ejection fraction, by estimation, is 50 to 55%. The left ventricle has low normal function. The left ventricular internal cavity size was normal in size. There is no left ventricular hypertrophy.  Left ventricular diastolic parameters were normal. Right Ventricle: The right ventricular size is mildly enlarged. No increase in right ventricular wall thickness. Right ventricular systolic function is normal. Left Atrium: Left atrial size was normal in size. Right Atrium: Right atrial size was mildly dilated. Pericardium: There is no evidence of pericardial effusion. Mitral Valve: The mitral valve is grossly normal. Mild mitral valve regurgitation. Tricuspid Valve: The tricuspid valve is grossly normal. Tricuspid valve regurgitation is mild. Aortic Valve: The aortic valve is tricuspid. Aortic valve regurgitation is mild. Aortic regurgitation PHT measures 499 msec. Aortic valve mean gradient measures 3.0 mmHg. Aortic valve peak gradient measures 5.1 mmHg. Aortic valve area, by VTI measures 2.56 cm. Pulmonic Valve: The pulmonic valve was not well visualized. Pulmonic valve regurgitation is mild. Aorta: The aortic root is normal in size and structure. IAS/Shunts: The interatrial septum was not assessed. LEFT VENTRICLE PLAX 2D LVIDd:         5.08 cm  Diastology LVIDs:         3.55 cm  LV e' medial:    7.07 cm/s LV PW:         1.02 cm  LV E/e' medial:  8.7 LV IVS:        0.77 cm  LV e' lateral:   4.03 cm/s LVOT diam:     2.10 cm  LV E/e' lateral: 15.2 LV SV:         56 LV SV Index:   31 LVOT Area:     3.46 cm  RIGHT VENTRICLE RV Basal diam:  3.86 cm RV S prime:     12.70 cm/s TAPSE (M-mode): 3.5 cm LEFT ATRIUM             Index       RIGHT ATRIUM           Index LA diam:        3.30 cm 1.82 cm/m  RA Area:     19.00 cm LA Vol (A2C):   52.2 ml 28.80 ml/m RA Volume:   54.60 ml  30.12 ml/m LA Vol (A4C):   22.9 ml 12.63 ml/m LA Biplane Vol: 36.4 ml 20.08 ml/m  AORTIC VALVE                   PULMONIC VALVE AV Area (Vmax):    2.03 cm    PV Vmax:        0.69 m/s AV Area (Vmean):   2.01 cm    PV Peak grad:   1.9 mmHg AV Area (  VTI):     2.56 cm    RVOT Peak grad: 2 mmHg AV Vmax:           112.50 cm/s AV Vmean:           78.500 cm/s AV VTI:            0.218 m AV Peak Grad:      5.1 mmHg AV Mean Grad:      3.0 mmHg LVOT Vmax:         65.90 cm/s LVOT Vmean:        45.500 cm/s LVOT VTI:          0.161 m LVOT/AV VTI ratio: 0.74 AI PHT:            499 msec  AORTA Ao Root diam: 3.00 cm MITRAL VALVE               TRICUSPID VALVE MV Area (PHT): 3.93 cm    TR Peak grad:   28.9 mmHg MV Decel Time: 193 msec    TR Vmax:        269.00 cm/s MV E velocity: 61.30 cm/s MV A velocity: 66.80 cm/s  SHUNTS MV E/A ratio:  0.92        Systemic VTI:  0.16 m                            Systemic Diam: 2.10 cm Bartholome Bill MD Electronically signed by Bartholome Bill MD Signature Date/Time: 05/13/2020/12:58:21 PM    Final       ASSESSMENT & PLAN:  1. 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 Left axillary mass final pathology is high-grade B-cell lymphoma, nonspecific type.   CD 20+, MYC+, BCL2-, BCL-6+, Cyclin D1-, Ki-67 >95% I communicated with Dr.Rubinas Recommend systemic chemotherapy with dose adjusted EPOCH with rituximab. The diagnosis and care plan were discussed with patient in detail.  The goal of treatment with curative intent.  chemotherapy education was provided.  We had discussed the composition of chemotherapy regimen, length of chemo cycle, duration of treatment and the time to assess response to treatment.    I explained to the patient the risks and benefits of chemotherapy R-EPOCH including all but not limited to hair loss, mouth sore, nausea, vomiting, diarrhea, low blood counts, bleeding, neuropathy, reactivation of virus infection, heart failure, and risk of life threatening infection and even death, secondary malignancy etc.  Patient voices understanding and willing to proceed chemotherapy.   Patient is at high risk of developing CNS disease.  Due to age above 47, HIV-associated lymphoma.  I recommend CNS prophylactic therapy.  Patient will  receive intrathecal methotrexate along with his systemic treatments.  Discussed about the rationale and potential side effects of intrathecal chemo therapy -Continue Bactrim daily. -Start acyclovir 400 mg twice daily -TLS prophylaxis check uric acid and phosphorus level today.  Recommend oral hydration- at least 8 glasses of liquid intake.  Recommend patient to start allopurinol 300 mg twice daily daily  # Chemotherapy education; patient has had port placed.. Antiemetics-Zofran and Compazine; EMLA cream sent to pharmacy and I discussed with patient about the antiemetics instructions.  # AIDS patient follows with ID physician.  Has been on antiretroviral therapy with Biktarvy  Supportive care measures are necessary for patient well-being and will be provided as necessary. We spent sufficient time to discuss many aspect of care, questions  were answered to patient's satisfaction. Follow up to be determined. Pending above testing.    Orders Placed This Encounter  Procedures  . CBC with Differential    Standing Status:   Standing    Number of Occurrences:   20    Standing Expiration Date:   06/06/2021  . Comprehensive metabolic panel    Standing Status:   Standing    Number of Occurrences:   20    Standing Expiration Date:   06/06/2021  . PHYSICIAN COMMUNICATION ORDER    A baseline Echo/ Muga should be obtained prior to initiation of Anthracycline Chemotherapy    All questions were answered. The patient knows to call the clinic with any problems questions or concerns.  cc Nobie Putnam *  Follow-up in 1 week plus minus IV fluid  Earlie Server, MD, PhD Hematology Oncology [facility] Pager- [phone] 06/06/2020

## 2020-06-06 NOTE — Progress Notes (Signed)
Pt evaluated prior to tx. Pt here for C1D1 of EPOCH (infusional)  Consent obtained. Reviewed regimen with pt. Educated on home pump. Premeds/ treatment given as ordered. Pt aware of meds that need to pick up at pharmacy today and start . Tx completed without incident. Pump connected . Pt discharged stable. Return as scheduled 1 day.

## 2020-06-06 NOTE — Progress Notes (Signed)
Patient denies new problems/concerns today.   °

## 2020-06-07 ENCOUNTER — Telehealth: Payer: Self-pay

## 2020-06-07 ENCOUNTER — Ambulatory Visit: Payer: Medicare HMO | Admitting: Cardiovascular Disease

## 2020-06-07 ENCOUNTER — Encounter: Payer: Self-pay | Admitting: Cardiovascular Disease

## 2020-06-07 ENCOUNTER — Inpatient Hospital Stay: Payer: Medicare HMO

## 2020-06-07 VITALS — BP 96/50 | HR 65 | Ht 69.0 in | Wt 170.0 lb

## 2020-06-07 VITALS — BP 111/64 | HR 67 | Temp 97.6°F | Resp 16

## 2020-06-07 DIAGNOSIS — I420 Dilated cardiomyopathy: Secondary | ICD-10-CM | POA: Diagnosis not present

## 2020-06-07 DIAGNOSIS — I428 Other cardiomyopathies: Secondary | ICD-10-CM

## 2020-06-07 DIAGNOSIS — C8514 Unspecified B-cell lymphoma, lymph nodes of axilla and upper limb: Secondary | ICD-10-CM | POA: Diagnosis not present

## 2020-06-07 DIAGNOSIS — C8510 Unspecified B-cell lymphoma, unspecified site: Secondary | ICD-10-CM

## 2020-06-07 DIAGNOSIS — D649 Anemia, unspecified: Secondary | ICD-10-CM | POA: Diagnosis not present

## 2020-06-07 DIAGNOSIS — K900 Celiac disease: Secondary | ICD-10-CM | POA: Diagnosis not present

## 2020-06-07 DIAGNOSIS — B20 Human immunodeficiency virus [HIV] disease: Secondary | ICD-10-CM | POA: Diagnosis not present

## 2020-06-07 DIAGNOSIS — Z5189 Encounter for other specified aftercare: Secondary | ICD-10-CM | POA: Diagnosis not present

## 2020-06-07 DIAGNOSIS — Z5111 Encounter for antineoplastic chemotherapy: Secondary | ICD-10-CM | POA: Diagnosis not present

## 2020-06-07 DIAGNOSIS — C8512 Unspecified B-cell lymphoma, intrathoracic lymph nodes: Secondary | ICD-10-CM | POA: Diagnosis not present

## 2020-06-07 DIAGNOSIS — Z5112 Encounter for antineoplastic immunotherapy: Secondary | ICD-10-CM | POA: Diagnosis not present

## 2020-06-07 MED ORDER — SODIUM CHLORIDE 0.9% FLUSH
10.0000 mL | INTRAVENOUS | Status: DC | PRN
  Administered 2020-06-07: 10 mL
  Filled 2020-06-07: qty 10

## 2020-06-07 MED ORDER — SODIUM CHLORIDE 0.9 % IV SOLN
Freq: Once | INTRAVENOUS | Status: AC
  Filled 2020-06-07: qty 250

## 2020-06-07 MED ORDER — SODIUM CHLORIDE 0.9 % IV SOLN
10.0000 mg | Freq: Once | INTRAVENOUS | Status: AC
  Administered 2020-06-07: 10 mg via INTRAVENOUS
  Filled 2020-06-07: qty 10

## 2020-06-07 MED ORDER — VINCRISTINE SULFATE CHEMO INJECTION 1 MG/ML
Freq: Once | INTRAVENOUS | Status: DC
  Filled 2020-06-07: qty 9

## 2020-06-07 NOTE — Progress Notes (Signed)
Cardiology Office Note  Date:  06/07/2020   ID:  Justin Wallace, DOB [birth year], MRN [id number]  PCP:  Olin Hauser, DO   Chief Complaint  Patient presents with  . New Patient (Initial Visit)    Referred by Dr. Tasia Catchings to discuss echo results. Patient is getting chemotherapy; Meds verbally reviewed with patient.    HPI:  Justin Wallace is a  74 y.o.  male with PMH of  symptomatic anemia HIV Followed by oncology Symptoms of body ache, shortness of breath anorexia, fever, weakness,, Diagnosed with lymphoma, Recently started chemotherapy Echocardiogram ejection fraction mildly depressed 50% Who presents by referral from Dr. Tasia Catchings for consultation of his cardiomyopathy  Presents with family on today's visit He reports no significant smoking history History of AIDS, HIV, treatment with Biktarvy  Left axillary mass noted by oncology,  large soft tissue mass up to 6.8 cm with internal vascularity.  03/30/2020 CT chest with contrast showed soft tissue mass/enlarged lymph node in the left axilla 6.3 cm, concerning for primary mass or metastatic lymphadenopathy.  oncerning for malignancy such as lymphoma or nodal metastatic disease.  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    05/13/2020, echocardiogram showed low normal end ejection fraction of 50 to 55%.  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  PSH:    Past Surgical History:  Procedure Laterality Date  . PORTA CATH INSERTION N/A 04/25/2020   Procedure: PORTA CATH INSERTION;  Surgeon: Algernon Huxley, MD;  Location: [facility];  Service: Cardiovascular;  Laterality: N/A;    Current Outpatient Medications  Medication Sig Dispense Refill  . acyclovir (ZOVIRAX) 400 MG tablet Take 1 tablet (400 mg total) by mouth 2 (two) times daily. 60 tablet 5  . allopurinol (ZYLOPRIM) 300 MG tablet Take 1 tablet (300 mg total) by mouth 2 (two) times daily. 60 tablet 0  . bictegravir-emtricitabine-tenofovir AF (BIKTARVY) 50-200-25 MG TABS tablet Take 1 tablet by mouth daily. 30 tablet 5  . lidocaine-prilocaine (EMLA) cream Apply to affected area once 30 g 3  . magic mouthwash w/lidocaine SOLN Take 5 mLs by mouth 4 (four) times daily as needed for mouth pain. Sig: Swish/spit 5-10 ml four times a day as needed 480 mL 1  . ondansetron (ZOFRAN) 8 MG tablet Take 1 tablet (8 mg total) by mouth 2 (two) times daily as needed for refractory nausea / vomiting. Start on the third day after chemotherapy. 30 tablet 1  . predniSONE (DELTASONE) 20 MG tablet Take 5.5 tablets (110 mg total) by mouth daily before breakfast. Daily for from Day 2 -Day 5 22 tablet 6  . sulfamethoxazole-trimethoprim (BACTRIM DS) 800-160 MG tablet Take 1 tablet by mouth daily. 30 tablet 3  . prochlorperazine (COMPAZINE) 10 MG tablet Take 1  tablet (10 mg total) by mouth every 6 (six) hours as needed (Nausea or vomiting). (Patient not taking: Reported on 06/06/2020) 30 tablet 1   No current facility-administered medications for this visit.   Facility-Administered Medications Ordered in Other Visits  Medication Dose Route Frequency Provider Last Rate Last Admin  . DOXOrubicin (ADRIAMYCIN) 18 mg, etoposide (VEPESID) 94 mg, vinCRIStine (ONCOVIN) 0.8  mg in sodium chloride 0.9 % 500 mL chemo infusion   Intravenous Once Earlie Server, MD   Given at 06/07/20 1441  . sodium chloride flush (NS) 0.9 % injection 10 mL  10 mL Intracatheter PRN Earlie Server, MD   10 mL at 06/07/20 1421     Allergies:   Patient has no known allergies.   Social History:  The patient  reports that he has never smoked. He has never used smokeless tobacco. He reports current alcohol use. He reports that he does not use drugs.   Family History:   family history includes Brain cancer in his father; Hyperlipidemia in his mother.    Review of Systems: Review of Systems  Constitutional: Positive for weight loss.  HENT: Negative.   Respiratory: Negative.   Cardiovascular: Negative.   Gastrointestinal: Negative.   Musculoskeletal: Negative.   Neurological: Negative.   Psychiatric/Behavioral: Negative.   All other systems reviewed and are negative.    PHYSICAL EXAM: VS:  BP (!) 96/50 (BP Location: Right Arm, Patient Position: Sitting, Cuff Size: Normal)   Pulse 65   Ht 5' 9"  (1.753 m)   Wt 170 lb (77.1 kg)   SpO2 98%   BMI 25.10 kg/m  , BMI Body mass index is 25.1 kg/m. GEN: Well nourished, well developed, in no acute distress HEENT: normal Neck: no JVD, carotid bruits, or masses Cardiac: RRR; no murmurs, rubs, or gallops,no edema  Respiratory:  clear to auscultation bilaterally, normal work of breathing GI: soft, nontender, nondistended, + BS MS: no deformity or atrophy Skin: warm and dry, no rash Neuro:  Strength and sensation are intact Psych: euthymic mood, full affect   Recent Labs: 03/17/2020: TSH 4.776 06/06/2020: ALT 14; BUN 29; Creatinine, Ser 0.96; Hemoglobin 12.4; Platelets 246; Potassium 4.1; Sodium 137    Lipid Panel Lab Results  Component Value Date   CHOL 158 05/05/2020   HDL 34 (L) 05/05/2020   LDLCALC 105 (H) 05/05/2020   TRIG 92 05/05/2020      Wt Readings from Last 3 Encounters:  06/07/20 170 lb (77.1 kg)  06/06/20 162 lb 8 oz  (73.7 kg)  05/18/20 161 lb (73 kg)     ASSESSMENT AND PLAN:  Problem List Items Addressed This Visit    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[place])   AIDS (acquired immune deficiency syndrome) ([place])    Other Visit Diagnoses    Dilated cardiomyopathy ([place])    -  Primary   Relevant Orders   EKG 12-Lead   NM Myocar Multi W/Spect W/Wall Motion / EF   Other cardiomyopathy ([place])         Dilated cardiomyopathy Etiology discussed with him, there is some coronary calcification, no significant prior smoking history, cholesterol is reasonable Ejection fraction I would estimate 45 to 50% Etiology is unclear Unable to exclude ischemia, he prefers full work-up to make sure we are not missing anything -After long discussion concerning various treatment options, we have arranged pharmacologic Myoview later this week -Unable to add medication such as ACE, ARB, Entresto, beta-blockers given markedly depressed blood pressure  Discussed that he monitor for symptoms  of worsening leg swelling or signs of heart failure as he goes through his chemo treatment This would include abdominal distention, PND orthopnea, cough    Total encounter time more than 45 minutes  Greater than 50% was spent in counseling and coordination of care with the patient    Signed, Esmond Plants, M.D., Ph.D. [place], [place]

## 2020-06-07 NOTE — Telephone Encounter (Signed)
Telephone call to patient for follow up after receiving first infusion.   Patient states infusion went great.  States eating good and drinking plenty of fluids.   Denies any nausea or vomiting.  Encouraged patient to call for any concerns or questions. 

## 2020-06-07 NOTE — Patient Instructions (Addendum)
Medication Instructions:  No changes  If you need a refill on your cardiac medications before your next appointment, please call your pharmacy.    Lab work: No new labs needed   If you have labs (blood work) drawn today and your tests are completely normal, you will receive your results only by: Marland Kitchen MyChart Message (if you have MyChart) OR . A paper copy in the mail If you have any lab test that is abnormal or we need to change your treatment, we will call you to review the results.   Testing/Procedures:lexiscan myoview for cardiomyopathy  [facility] MYOVIEW  Your caregiver has ordered a Stress Test with nuclear imaging. The purpose of this test is to evaluate the blood supply to your heart muscle. This procedure is referred to as a "Non-Invasive Stress Test." This is because other than having an IV started in your vein, nothing is inserted or "invades" your body. Cardiac stress tests are done to find areas of poor blood flow to the heart by determining the extent of coronary artery disease (CAD). Some patients exercise on a treadmill, which naturally increases the blood flow to your heart, while others who are  unable to walk on a treadmill due to physical limitations have a pharmacologic/chemical stress agent called Lexiscan . This medicine will mimic walking on a treadmill by temporarily increasing your coronary blood flow.   Please note: these test may take anywhere between 2-4 hours to complete  PLEASE REPORT TO [facility] AT THE FIRST DESK WILL DIRECT YOU WHERE TO GO  Date of Procedure:_____________________________________  Arrival Time for Procedure:______________________________    PLEASE NOTIFY THE OFFICE AT LEAST 24 HOURS IN ADVANCE IF YOU ARE UNABLE TO KEEP YOUR APPOINTMENT.  [phone] AND  PLEASE NOTIFY NUCLEAR MEDICINE AT [facility] AT LEAST 24 HOURS IN ADVANCE IF YOU ARE UNABLE TO KEEP YOUR APPOINTMENT. [phone]  How to prepare for your  Myoview test:  1. Do not eat or drink after midnight 2. No caffeine for 24 hours prior to test 3. No smoking 24 hours prior to test. 4. Your medication may be taken with water.  If your doctor stopped a medication because of this test, do not take that medication. 5. Ladies, please do not wear dresses.  Skirts or pants are appropriate. Please wear a short sleeve shirt. 6. No perfume, cologne or lotion. 7. Wear comfortable walking shoes. No heels!    Follow-Up: At [facility], you and your health needs are our priority.  As part of our continuing mission to provide you with exceptional heart care, we have created designated Provider Care Teams.  These Care Teams include your primary Cardiologist (physician) and Advanced Practice Providers (APPs -  Physician Assistants and Nurse Practitioners) who all work together to provide you with the care you need, when you need it.  . You will need a follow up appointment as needed  . Providers on your designated Care Team:   . Murray Hodgkins, NP . Christell Faith, PA-C . Marrianne Mood, PA-C  Any Other Special Instructions Will Be Listed Below (If Applicable).  COVID-19 Vaccine Information can be found at: ShippingScam.co.uk For questions related to vaccine distribution or appointments, please email vaccine@[email or web address] .com or call [phone].

## 2020-06-08 ENCOUNTER — Other Ambulatory Visit: Payer: Self-pay

## 2020-06-08 ENCOUNTER — Inpatient Hospital Stay: Payer: Medicare HMO

## 2020-06-08 VITALS — BP 120/60 | HR 67 | Temp 97.4°F | Resp 16

## 2020-06-08 DIAGNOSIS — C8510 Unspecified B-cell lymphoma, unspecified site: Secondary | ICD-10-CM

## 2020-06-08 DIAGNOSIS — D649 Anemia, unspecified: Secondary | ICD-10-CM | POA: Diagnosis not present

## 2020-06-08 DIAGNOSIS — K900 Celiac disease: Secondary | ICD-10-CM | POA: Diagnosis not present

## 2020-06-08 DIAGNOSIS — C8514 Unspecified B-cell lymphoma, lymph nodes of axilla and upper limb: Secondary | ICD-10-CM | POA: Diagnosis not present

## 2020-06-08 DIAGNOSIS — Z5111 Encounter for antineoplastic chemotherapy: Secondary | ICD-10-CM | POA: Diagnosis not present

## 2020-06-08 DIAGNOSIS — B20 Human immunodeficiency virus [HIV] disease: Secondary | ICD-10-CM | POA: Diagnosis not present

## 2020-06-08 DIAGNOSIS — C8512 Unspecified B-cell lymphoma, intrathoracic lymph nodes: Secondary | ICD-10-CM | POA: Diagnosis not present

## 2020-06-08 DIAGNOSIS — Z5112 Encounter for antineoplastic immunotherapy: Secondary | ICD-10-CM | POA: Diagnosis not present

## 2020-06-08 DIAGNOSIS — Z5189 Encounter for other specified aftercare: Secondary | ICD-10-CM | POA: Diagnosis not present

## 2020-06-08 MED ORDER — VINCRISTINE SULFATE CHEMO INJECTION 1 MG/ML
Freq: Once | INTRAVENOUS | Status: DC
  Filled 2020-06-08: qty 9

## 2020-06-08 MED ORDER — PALONOSETRON HCL INJECTION 0.25 MG/5ML
0.2500 mg | Freq: Once | INTRAVENOUS | Status: AC
  Administered 2020-06-08: 0.25 mg via INTRAVENOUS
  Filled 2020-06-08: qty 5

## 2020-06-09 ENCOUNTER — Inpatient Hospital Stay: Payer: Medicare HMO

## 2020-06-09 VITALS — BP 115/67 | HR 64 | Resp 16

## 2020-06-09 DIAGNOSIS — K900 Celiac disease: Secondary | ICD-10-CM | POA: Diagnosis not present

## 2020-06-09 DIAGNOSIS — Z5189 Encounter for other specified aftercare: Secondary | ICD-10-CM | POA: Diagnosis not present

## 2020-06-09 DIAGNOSIS — C8510 Unspecified B-cell lymphoma, unspecified site: Secondary | ICD-10-CM

## 2020-06-09 DIAGNOSIS — B20 Human immunodeficiency virus [HIV] disease: Secondary | ICD-10-CM | POA: Diagnosis not present

## 2020-06-09 DIAGNOSIS — D649 Anemia, unspecified: Secondary | ICD-10-CM | POA: Diagnosis not present

## 2020-06-09 DIAGNOSIS — Z5111 Encounter for antineoplastic chemotherapy: Secondary | ICD-10-CM | POA: Diagnosis not present

## 2020-06-09 DIAGNOSIS — C8514 Unspecified B-cell lymphoma, lymph nodes of axilla and upper limb: Secondary | ICD-10-CM | POA: Diagnosis not present

## 2020-06-09 DIAGNOSIS — Z5112 Encounter for antineoplastic immunotherapy: Secondary | ICD-10-CM | POA: Diagnosis not present

## 2020-06-09 DIAGNOSIS — C8512 Unspecified B-cell lymphoma, intrathoracic lymph nodes: Secondary | ICD-10-CM | POA: Diagnosis not present

## 2020-06-09 LAB — BASIC METABOLIC PANEL
Anion gap: 6 (ref 5–15)
BUN: 28 mg/dL — ABNORMAL HIGH (ref 8–23)
CO2: 24 mmol/L (ref 22–32)
Calcium: 9 mg/dL (ref 8.9–10.3)
Chloride: 107 mmol/L (ref 98–111)
Creatinine, Ser: 0.94 mg/dL (ref 0.61–1.24)
GFR, Estimated: >60 mL/min (ref >60)
Glucose, Bld: 110 mg/dL — ABNORMAL HIGH (ref 70–99)
Potassium: 4 mmol/L (ref 3.5–5.1)
Sodium: 137 mmol/L (ref 135–145)

## 2020-06-09 LAB — PHOSPHORUS: Phosphorus: 3.3 mg/dL (ref 2.5–4.6)

## 2020-06-09 LAB — URIC ACID: Uric Acid, Serum: 5 mg/dL (ref 3.7–8.6)

## 2020-06-09 MED ORDER — VINCRISTINE SULFATE CHEMO INJECTION 1 MG/ML
Freq: Once | INTRAVENOUS | Status: DC
  Filled 2020-06-09: qty 9

## 2020-06-10 ENCOUNTER — Other Ambulatory Visit: Payer: Self-pay

## 2020-06-10 ENCOUNTER — Inpatient Hospital Stay: Payer: Medicare HMO

## 2020-06-10 VITALS — BP 119/63 | HR 67 | Temp 98.2°F | Resp 16

## 2020-06-10 DIAGNOSIS — C8514 Unspecified B-cell lymphoma, lymph nodes of axilla and upper limb: Secondary | ICD-10-CM | POA: Diagnosis not present

## 2020-06-10 DIAGNOSIS — D649 Anemia, unspecified: Secondary | ICD-10-CM | POA: Diagnosis not present

## 2020-06-10 DIAGNOSIS — B20 Human immunodeficiency virus [HIV] disease: Secondary | ICD-10-CM | POA: Diagnosis not present

## 2020-06-10 DIAGNOSIS — Z5112 Encounter for antineoplastic immunotherapy: Secondary | ICD-10-CM | POA: Diagnosis not present

## 2020-06-10 DIAGNOSIS — Z5189 Encounter for other specified aftercare: Secondary | ICD-10-CM | POA: Diagnosis not present

## 2020-06-10 DIAGNOSIS — K900 Celiac disease: Secondary | ICD-10-CM | POA: Diagnosis not present

## 2020-06-10 DIAGNOSIS — C8510 Unspecified B-cell lymphoma, unspecified site: Secondary | ICD-10-CM

## 2020-06-10 DIAGNOSIS — Z5111 Encounter for antineoplastic chemotherapy: Secondary | ICD-10-CM | POA: Diagnosis not present

## 2020-06-10 MED ORDER — SODIUM CHLORIDE 0.9 % IV SOLN
Freq: Once | INTRAVENOUS | Status: AC
  Filled 2020-06-10: qty 250

## 2020-06-10 MED ORDER — HEPARIN SOD (PORK) LOCK FLUSH 100 UNIT/ML IV SOLN
500.0000 [IU] | Freq: Once | INTRAVENOUS | Status: AC | PRN
  Administered 2020-06-10: 500 [IU]
  Filled 2020-06-10: qty 5

## 2020-06-10 MED ORDER — PEGFILGRASTIM 6 MG/0.6ML ~~LOC~~ PSKT
6.0000 mg | PREFILLED_SYRINGE | Freq: Once | SUBCUTANEOUS | Status: AC
  Administered 2020-06-10: 6 mg via SUBCUTANEOUS
  Filled 2020-06-10: qty 0.6

## 2020-06-10 MED ORDER — SODIUM CHLORIDE 0.9 % IV SOLN
750.0000 mg/m2 | Freq: Once | INTRAVENOUS | Status: AC
  Administered 2020-06-10: 1420 mg via INTRAVENOUS
  Filled 2020-06-10: qty 50

## 2020-06-10 MED ORDER — PALONOSETRON HCL INJECTION 0.25 MG/5ML
0.2500 mg | Freq: Once | INTRAVENOUS | Status: AC
  Administered 2020-06-10: 0.25 mg via INTRAVENOUS
  Filled 2020-06-10: qty 5

## 2020-06-12 ENCOUNTER — Telehealth: Payer: Self-pay | Admitting: Internal Medicine

## 2020-06-12 NOTE — Telephone Encounter (Signed)
On 11/13-patient called regarding on pro malfunction [non-firing].  Patie on 11/15  has appointment in the cancer center at ~10 am.  Recommend keep that appointment.   Will inform Dr. Tasia Catchings if patient will need alternative growth factor/insurance permitting.  GB

## 2020-06-13 ENCOUNTER — Inpatient Hospital Stay (HOSPITAL_BASED_OUTPATIENT_CLINIC_OR_DEPARTMENT_OTHER): Payer: Medicare HMO | Admitting: Oncology

## 2020-06-13 ENCOUNTER — Inpatient Hospital Stay: Payer: Medicare HMO

## 2020-06-13 ENCOUNTER — Encounter: Payer: Self-pay | Admitting: Oncology

## 2020-06-13 VITALS — BP 109/63 | HR 73 | Temp 97.4°F | Resp 18 | Wt 176.4 lb

## 2020-06-13 DIAGNOSIS — Z5189 Encounter for other specified aftercare: Secondary | ICD-10-CM | POA: Diagnosis not present

## 2020-06-13 DIAGNOSIS — B20 Human immunodeficiency virus [HIV] disease: Secondary | ICD-10-CM

## 2020-06-13 DIAGNOSIS — Z21 Asymptomatic human immunodeficiency virus [HIV] infection status: Secondary | ICD-10-CM | POA: Insufficient documentation

## 2020-06-13 DIAGNOSIS — C8510 Unspecified B-cell lymphoma, unspecified site: Secondary | ICD-10-CM | POA: Diagnosis not present

## 2020-06-13 DIAGNOSIS — Z5111 Encounter for antineoplastic chemotherapy: Secondary | ICD-10-CM | POA: Diagnosis not present

## 2020-06-13 DIAGNOSIS — Z7189 Other specified counseling: Secondary | ICD-10-CM | POA: Diagnosis not present

## 2020-06-13 DIAGNOSIS — Z5112 Encounter for antineoplastic immunotherapy: Secondary | ICD-10-CM | POA: Diagnosis not present

## 2020-06-13 DIAGNOSIS — D649 Anemia, unspecified: Secondary | ICD-10-CM | POA: Diagnosis not present

## 2020-06-13 DIAGNOSIS — K900 Celiac disease: Secondary | ICD-10-CM | POA: Diagnosis not present

## 2020-06-13 DIAGNOSIS — C8514 Unspecified B-cell lymphoma, lymph nodes of axilla and upper limb: Secondary | ICD-10-CM | POA: Diagnosis not present

## 2020-06-13 LAB — COMPREHENSIVE METABOLIC PANEL
ALT: 18 U/L (ref 0–44)
AST: 18 U/L (ref 15–41)
Albumin: 2.8 g/dL — ABNORMAL LOW (ref 3.5–5.0)
Alkaline Phosphatase: 76 U/L (ref 38–126)
Anion gap: 6 (ref 5–15)
BUN: 33 mg/dL — ABNORMAL HIGH (ref 8–23)
CO2: 24 mmol/L (ref 22–32)
Calcium: 8 mg/dL — ABNORMAL LOW (ref 8.9–10.3)
Chloride: 105 mmol/L (ref 98–111)
Creatinine, Ser: 1.08 mg/dL (ref 0.61–1.24)
GFR, Estimated: >60 mL/min (ref >60)
Glucose, Bld: 92 mg/dL (ref 70–99)
Potassium: 3.8 mmol/L (ref 3.5–5.1)
Sodium: 135 mmol/L (ref 135–145)
Total Bilirubin: 0.5 mg/dL (ref 0.3–1.2)
Total Protein: 6 g/dL — ABNORMAL LOW (ref 6.5–8.1)

## 2020-06-13 LAB — CBC WITH DIFFERENTIAL/PLATELET
Abs Immature Granulocytes: 2.25 10*3/uL — ABNORMAL HIGH (ref 0.00–0.07)
Basophils Absolute: 0.1 10*3/uL (ref 0.0–0.1)
Basophils Relative: 1 %
Eosinophils Absolute: 0.3 10*3/uL (ref 0.0–0.5)
Eosinophils Relative: 2 %
HCT: 35.6 % — ABNORMAL LOW (ref 39.0–52.0)
Hemoglobin: 12.1 g/dL — ABNORMAL LOW (ref 13.0–17.0)
Immature Granulocytes: 17 %
Lymphocytes Relative: 2 %
Lymphs Abs: 0.3 10*3/uL — ABNORMAL LOW (ref 0.7–4.0)
MCH: 29.4 pg (ref 26.0–34.0)
MCHC: 34 g/dL (ref 30.0–36.0)
MCV: 86.4 fL (ref 80.0–100.0)
Monocytes Absolute: 0.2 10*3/uL (ref 0.1–1.0)
Monocytes Relative: 1 %
Neutro Abs: 10.4 10*3/uL — ABNORMAL HIGH (ref 1.7–7.7)
Neutrophils Relative %: 77 %
Platelets: 162 10*3/uL (ref 150–400)
RBC: 4.12 MIL/uL — ABNORMAL LOW (ref 4.22–5.81)
RDW: 15.7 % — ABNORMAL HIGH (ref 11.5–15.5)
Smear Review: NORMAL
WBC: 13.5 10*3/uL — ABNORMAL HIGH (ref 4.0–10.5)
nRBC: 0 % (ref 0.0–0.2)

## 2020-06-13 LAB — PHOSPHORUS: Phosphorus: 3.9 mg/dL (ref 2.5–4.6)

## 2020-06-13 LAB — URIC ACID: Uric Acid, Serum: 3.5 mg/dL — ABNORMAL LOW (ref 3.7–8.6)

## 2020-06-13 MED ORDER — SODIUM CHLORIDE 0.9 % IV SOLN
Freq: Once | INTRAVENOUS | Status: AC
  Filled 2020-06-13: qty 250

## 2020-06-13 MED ORDER — SODIUM CHLORIDE 0.9% FLUSH
10.0000 mL | Freq: Once | INTRAVENOUS | Status: AC
  Administered 2020-06-13: 10 mL via INTRAVENOUS
  Filled 2020-06-13: qty 10

## 2020-06-13 MED ORDER — HEPARIN SOD (PORK) LOCK FLUSH 100 UNIT/ML IV SOLN
INTRAVENOUS | Status: AC
  Filled 2020-06-13: qty 5

## 2020-06-13 MED FILL — BIKTARVY 50-200-25 MG TABS: 50-200-25 | 30 days supply | Qty: 30 | Fill #1

## 2020-06-13 NOTE — Telephone Encounter (Signed)
Message sent to [place]. To see if [place] needs insurance authorization

## 2020-06-13 NOTE — Telephone Encounter (Signed)
He will need neulasta today.

## 2020-06-13 NOTE — Telephone Encounter (Signed)
Dr. Tasia Catchings assessed patient today and she thinks the OnPro did work, no Neulasta needed today.

## 2020-06-13 NOTE — Progress Notes (Signed)
Pt here for follow up. Pt called over weekend reporting that his onpro malfunctioned. Pt now reporting that he believes he did receive medication as he is having some leg pain. He did not see any medication leaking from device.

## 2020-06-13 NOTE — Progress Notes (Signed)
Hematology/Oncology follow up note [facility] Telephone:([phone]) [phone] Fax:([phone]   Patient Care Team: Olin Hauser, DO as PCP - General (Family Medicine)  REFERRING PROVIDER: Nobie Putnam *  CHIEF COMPLAINTS/REASON FOR VISIT:  Follow up for axillary mass  HISTORY OF PRESENTING ILLNESS:   Justin Wallace is a  74 y.o.  male with PMH listed below was seen in consultation at the request of  Nobie Putnam *  for evaluation of symptomatic anemia Patient reported history of sudden onset of diffuse body aches, decreased appetite, headache low-grade fever, profound weakness/fatigue about 2 weeks ago.  Prior to the onset of symptoms, he went to a funeral as well as applicable clinic.  He reports that he has received COVID-19 vaccination previously.  He moved from [place].  Daughter is an Therapist, sports and told him to get COVID-19 PCR checked and he was tested negative. Patient also reports a sudden drop of weight since the onset of symptoms.  Patient has a history of celiac disease.  No colonoscopy records or pathology records in current EMR.  Diagnosis was done by [facility] gastroenterology.  Patient reports that he was in his usual state of health until the onset of symptoms.  03/10/2020, patient was evaluated by primary care provider.  Blood work showed mild anemia with hemoglobin of 13.1, Iron panel showed decreased saturation of 8, ferritin 218, TIBC 291, patient was referred to hematology for evaluation of symptomatic anemia and iron deficiency.  Patient reports that her body aches, fever and headache symptoms have improved.  However he continues to feel very tired and fatigued.  Appetite has decreased and weight remains low.  # Physical examination showed left axillary mass  ultrasound confirmed a left axillary large soft tissue mass up to 6.8 cm with internal vascularity. 03/30/2020 CT chest with contrast showed soft tissue mass/enlarged lymph  node in the left axilla 6.3 cm, concerning for primary mass or metastatic lymphadenopathy.  There are additional prominent although much smaller right axillary lymph nodes, pretracheal and superior mediastinal lymph nodes.  Findings are concerning for malignancy such as lymphoma or nodal metastatic disease.  # treated for Lymes disease, finished doxycycline 15m BID for 21 days course. 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 he has started treatment with Biktarvy, also started on Bactrim daily.  04/26/2020 PET scan showed large intensely hypermetabolic left axillary mass SUV 28.  Additional small bilateral axilla lymph node with mild metabolic activity SUV 2.  Metabolic activity through the porta hepatis region without enlarged nodes identified.  No bone lesions. 05/02/2020 bone marrow biopsy showed hypercellular marrow for age with trilineage hematopoiesis.  Increased number of CD10 positive B cells, cannot rule out minimal or subtle involvement of marrow by the high-grade B-cell lymphoma. Normal cytogenetics. 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  Positive for BCL6 and MYC expression.  FISH testing showed negative for BCL-2/BCL6/MYC gene rearrangement.   Trisomy 3 and 18 were identified and are nonspecific findings seen in clonal lymphoid neoplasm.  Final results were signed out on 05/30/2020-due to the delay of  FISH results.  B sed on the FISH results, lymphoma is best classified as high-grade B-cell lymphoma not otherwise specified.  It does not meet morphological criteria for diffuse large B-cell lymphoma nor molecular criteria for BRCA lymphoma.  11 q. alteration testing has been sent.  Results are pending. 04/26/2020, MUGA testing showed normal LVEF of 56% with normal LV wall motion.  05/13/2020, echocardiogram showed low normal end ejection fraction of 50 to 55%.  #04/19/2020 Mediport placed by Dr. Lucky Cowboy  INTERVAL  HISTORY Justin Wallace is a 74 y.o. male who has above history reviewed by me today presents for follow up visit for high-grade B-cell lymphoma Problems and complaints are listed below: Patient finished cycle 1R-EPOCH treatment.  Overall he tolerates well.  Denies any nausea vomiting diarrhea, denies fever or chills. He feels that the left axillary mass has decreased in size. Patient was accompanied by his wife today. Patient reports that he did not hear the ring from [place].  Prior examination today, Onpro Neulasta device was empty. He also reports having bilateral leg pain after.    Review of Systems  Constitutional: Negative for appetite change, chills, fatigue, fever and unexpected weight change.  HENT:   Negative for hearing loss and voice change.   Eyes: Negative for eye problems and icterus.  Respiratory: Negative for chest tightness, cough and shortness of breath.   Cardiovascular: Negative for chest pain and leg swelling.  Gastrointestinal: Negative for abdominal distention and abdominal pain.  Endocrine: Negative for hot flashes.  Genitourinary: Negative for difficulty urinating, dysuria and frequency.   Musculoskeletal: Negative for arthralgias.  Skin: Negative for itching and rash.  Neurological: Negative for light-headedness and numbness.  Hematological: Negative for adenopathy. Does not bruise/bleed easily.  Psychiatric/Behavioral: Negative for confusion.    MEDICAL HISTORY:  Past Medical History:  Diagnosis Date  . Celiac disease   . 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[place]) 05/27/2020  . HIV (human immunodeficiency virus infection) ([place])   . Lyme disease   . Lymphoma ([place])     SURGICAL HISTORY: Past Surgical History:  Procedure Laterality Date  . PORTA CATH INSERTION N/A 04/25/2020   Procedure: PORTA CATH INSERTION;  Surgeon: Algernon Huxley, MD;  Location: [place] [facility];  Service: Cardiovascular;  Laterality: N/A;    SOCIAL HISTORY: Social History    Socioeconomic History  . Marital status: Married    Spouse name: Opal Sidles   . Number of children: 2  . Years of education: Not on file  . Highest education level: Not on file  Occupational History  . Not on file  Tobacco Use  . Smoking status: Never Smoker  . Smokeless tobacco: Never Used  Vaping Use  . Vaping Use: Never used  Substance and Sexual Activity  . Alcohol use: Yes    Comment: occasional  . Drug use: Never  . Sexual activity: Never  Other Topics Concern  . Not on file  Social History Narrative  . Not on file   Social Determinants of Health   Financial Resource Strain:   . Difficulty of Paying Living Expenses: Not on file  Food Insecurity:   . Worried About Charity fundraiser in the Last Year: Not on file  . Ran Out of Food in the Last Year: Not on file  Transportation Needs:   . Lack of Transportation (Medical): Not on file  . Lack of Transportation (Non-Medical): Not on file  Physical Activity:   . Days of Exercise per Week: Not on file  . Minutes of Exercise per Session: Not on file  Stress:   . Feeling of Stress : Not on file  Social Connections:   . Frequency of Communication with Friends and Family: Not on file  . Frequency of Social Gatherings with Friends and Family: Not on file  . Attends Religious Services: Not on file  . Active Member of  Clubs or Organizations: Not on file  . Attends Archivist Meetings: Not on file  . Marital Status: Not on file  Intimate Partner Violence:   . Fear of Current or Ex-Partner: Not on file  . Emotionally Abused: Not on file  . Physically Abused: Not on file  . Sexually Abused: Not on file    FAMILY HISTORY: Family History  Problem Relation Age of Onset  . Hyperlipidemia Mother   . Brain cancer Father     ALLERGIES:  has No Known Allergies.  MEDICATIONS:  Current Outpatient Medications  Medication Sig Dispense Refill  . acyclovir (ZOVIRAX) 400 MG tablet Take 1 tablet (400 mg total) by mouth 2  (two) times daily. 60 tablet 5  . allopurinol (ZYLOPRIM) 300 MG tablet Take 1 tablet (300 mg total) by mouth 2 (two) times daily. 60 tablet 0  . bictegravir-emtricitabine-tenofovir AF (BIKTARVY) 50-200-25 MG TABS tablet Take 1 tablet by mouth daily. 30 tablet 5  . lidocaine-prilocaine (EMLA) cream Apply to affected area once 30 g 3  . magic mouthwash w/lidocaine SOLN Take 5 mLs by mouth 4 (four) times daily as needed for mouth pain. Sig: Swish/spit 5-10 ml four times a day as needed 480 mL 1  . ondansetron (ZOFRAN) 8 MG tablet Take 1 tablet (8 mg total) by mouth 2 (two) times daily as needed for refractory nausea / vomiting. Start on the third day after chemotherapy. 30 tablet 1  . predniSONE (DELTASONE) 20 MG tablet Take 5.5 tablets (110 mg total) by mouth daily before breakfast. Daily for from Day 2 -Day 5 22 tablet 6  . prochlorperazine (COMPAZINE) 10 MG tablet Take 1 tablet (10 mg total) by mouth every 6 (six) hours as needed (Nausea or vomiting). 30 tablet 1  . sulfamethoxazole-trimethoprim (BACTRIM DS) 800-160 MG tablet Take 1 tablet by mouth daily. 30 tablet 3   No current facility-administered medications for this visit.     PHYSICAL EXAMINATION: ECOG PERFORMANCE STATUS: 1 - Symptomatic but completely ambulatory Vitals:   06/13/20 1059  BP: 109/63  Pulse: 73  Resp: 18  Temp: (!) 97.4 F (36.3 C)   Filed Weights   06/13/20 1059  Weight: 176 lb 6.4 oz (80 kg)    Physical Exam Constitutional:      General: He is not in acute distress. HENT:     Head: Normocephalic and atraumatic.  Eyes:     General: No scleral icterus. Cardiovascular:     Rate and Rhythm: Normal rate and regular rhythm.     Heart sounds: Normal heart sounds.  Pulmonary:     Effort: Pulmonary effort is normal. No respiratory distress.     Breath sounds: No wheezing.  Abdominal:     General: Bowel sounds are normal. There is no distension.     Palpations: Abdomen is soft.  Musculoskeletal:         General: No deformity. Normal range of motion.     Cervical back: Normal range of motion and neck supple.     Comments: Left axillary mass has decreased in size  Skin:    General: Skin is warm and dry.     Findings: No erythema or rash.  Neurological:     Mental Status: He is alert and oriented to person, place, and time. Mental status is at baseline.     Cranial Nerves: No cranial nerve deficit.     Coordination: Coordination normal.  Psychiatric:        Mood and Affect: Mood  normal.     LABORATORY DATA:  I have reviewed the data as listed Lab Results  Component Value Date   WBC 13.5 (H) 06/13/2020   HGB 12.1 (L) 06/13/2020   HCT 35.6 (L) 06/13/2020   MCV 86.4 06/13/2020   PLT 162 06/13/2020   Recent Labs    03/10/20 1113 03/10/20 1113 03/17/20 1029 05/05/20 0956 05/05/20 0956 06/06/20 0822 06/09/20 1409 06/13/20 1025  NA 138   < > 137 139   < > 137 137 135  K 4.8   < > 3.8 4.4   < > 4.1 4.0 3.8  CL 106   < > 104 107   < > 108 107 105  CO2 27   < > 27 27   < > 24 24 24   GLUCOSE 94   < > 97 97   < > 97 110* 92  BUN 23   < > 21 19   < > 29* 28* 33*  CREATININE 0.91   < > 1.09 0.88  --  0.96 0.94 1.08  CALCIUM 8.4*   < > 8.1* 9.1   < > 9.0 9.0 8.0*  GFRNONAA 84   < > >60 86  --  >60 >60 >60  GFRAA 98  --  >60 100  --   --   --   --   PROT 6.8   < > 8.0 7.4  --  7.9  --  6.0*  ALBUMIN  --   --  2.7*  --   --  3.5  --  2.8*  AST 20   < > 24 15  --  21  --  18  ALT 13   < > 18 8*  --  14  --  18  ALKPHOS  --   --  140*  --   --  82  --  76  BILITOT 0.4   < > 0.3 0.5  --  0.5  --  0.5   < > = values in this interval not displayed.   Iron/TIBC/Ferritin/ %Sat    Component Value Date/Time   IRON 22 (L) 03/17/2020 1029   IRON 17 02/17/2018 0000   TIBC 286 03/17/2020 1029   TIBC 425 02/17/2018 0000   FERRITIN 284 03/17/2020 1029   IRONPCTSAT 8 (L) 03/17/2020 1029   IRONPCTSAT 8 (L) 03/10/2020 1113      RADIOGRAPHIC STUDIES: I have personally reviewed the  radiological images as listed and agreed with the findings in the report. No results found. CT CHEST W CONTRAST  Result Date: 03/30/2020 CLINICAL DATA:  Left axillary mass detected by ultrasound, fatigue, weight loss EXAM: CT CHEST WITH CONTRAST TECHNIQUE: Multidetector CT imaging of the chest was performed during intravenous contrast administration. CONTRAST:  24m OMNIPAQUE IOHEXOL 300 MG/ML  SOLN COMPARISON:  None. FINDINGS: Cardiovascular: Aortic atherosclerosis. Normal heart size. Left coronary artery calcifications. No pericardial effusion. Mediastinum/Nodes: There is a soft tissue mass or grossly enlarged lymph node in the left axilla measuring 6.3 x 4.6 x 3.0 cm (series 2, image 47, series 6, image 155). There are additional prominent, although much smaller right axillary lymph nodes, pretracheal, and superior mediastinal lymph nodes, measuring up to 1.0 x 0.8 cm in the superior mediastinum (series 2, image 42). No other enlarged mediastinal, hilar, or axillary lymph nodes. Thyroid gland, trachea, and esophagus demonstrate no significant findings. Lungs/Pleura: Mild dependent bibasilar scarring and or atelectasis. No pleural effusion or pneumothorax. Upper Abdomen: No acute abnormality. Musculoskeletal:  No chest wall mass or suspicious bone lesions identified. IMPRESSION: 1. There is a soft tissue mass or grossly enlarged lymph node in the left axilla measuring 6.3 cm, concerning for primary mass or metastatic lymphadenopathy. 2. There are additional prominent, although much smaller right axillary lymph nodes, pretracheal, and superior mediastinal lymph nodes. Findings are concerning for malignancy such as lymphoma or nodal metastatic disease. Whole-body PET-CT may be helpful to assess for abnormal metabolic activity and additional abnormal lymph nodes or other evidence of metastatic disease in the abdomen and pelvis. 3. No evidence of pulmonary metastatic disease. 4. Coronary artery disease.  Aortic  Atherosclerosis (ICD10-I70.0). Electronically Signed   By: Eddie Candle M.D.   On: 03/30/2020 11:46   NM Cardiac Muga Rest  Result Date: 04/27/2020 CLINICAL DATA:  High-grade B-cell lymphoma, pre chemotherapy assessment EXAM: NUCLEAR MEDICINE CARDIAC BLOOD POOL IMAGING (MUGA) TECHNIQUE: Cardiac multi-gated acquisition was performed at rest following intravenous injection of Tc-28mlabeled red blood cells. RADIOPHARMACEUTICALS:  22.01 mCi Tc-975mertechnetate in-vitro labeled red blood cells IV COMPARISON:  None FINDINGS: Calculated LEFT ventricular ejection fraction is 56%, normal. Study was obtained at a cardiac rate of 62 bpm. Patient was rhythmic during imaging. Cine analysis of the LEFT ventricle in 3 projections demonstrates normal LV wall motion. IMPRESSION: Normal LEFT ventricular ejection fraction of 56% with normal LV wall motion. Electronically Signed   By: MaLavonia Dana.D.   On: 04/27/2020 11:42   PERIPHERAL VASCULAR CATHETERIZATION  Result Date: 04/25/2020 See op note  NM PET Image Initial (PI) Whole Body  Result Date: 04/12/2020 CLINICAL DATA:  Initial treatment strategy for LEFT axillary mass. EXAM: NUCLEAR MEDICINE PET WHOLE BODY TECHNIQUE: 7.8 mCi F-18 FDG was injected intravenously. Full-ring PET imaging was performed from the head to foot after the radiotracer. CT data was obtained and used for attenuation correction and anatomic localization. Fasting blood glucose: 85 mg/dl COMPARISON:  None. FINDINGS: Mediastinal blood pool activity: SUV max 2.3 HEAD/NECK: No hypermetabolic activity in the scalp. No hypermetabolic cervical lymph nodes. Incidental CT findings: none CHEST: Large LEFT axillary mass measures 2.7 by 5.0 cm short axis with intense metabolic activity (SUV max equal 28) Several smaller LEFT axillary nodes more superiorly are not pathologic by size criteria and have minimal metabolic activity (SUV max 2.0) Similar small RIGHT axillary lymph nodes are normal morphology with low  metabolic activity (SUV max equal 2.7 (image 472.) Incidental CT findings: No suspicious pulmonary nodules. ABDOMEN/PELVIS: There is moderate activity in the porta hepatis region with SUV max equal 4.7. No discrete enlarged nodes are present. No abnormal activity liver. The spleen is normal size and normal metabolic activity. No periaortic or iliac adenopathy. Pelvic adenopathy or inguinal adenopathy. Incidental CT findings: none SKELETON: No focal hypermetabolic activity to suggest skeletal metastasis. Incidental CT findings: No lytic lesion. EXTREMITIES: No abnormal hypermetabolic activity in the lower extremities. Incidental CT findings: No lytic or sclerotic lesion IMPRESSION: 1. Large intensely hypermetabolic LEFT axillary mass with differential including solitary massive lymph node versus soft tissue plasmacytoma. 2. Additional small bilateral axial lymph nodes with mild metabolic activity. Favor reactive nodes. 3. Metabolic activity through the porta hepatis region without enlarged nodes identified. Favor reactive activity or benign GI activity. 4. Normal spleen and bone marrow. 5. No lytic or hypermetabolic skeletal lesion. Electronically Signed   By: StSuzy Bouchard.D.   On: 04/12/2020 14:40   CT BONE MARROW BIOPSY & ASPIRATION  Result Date: 05/02/2020 INDICATION: [age]ear old male with recent diagnosis of high-grade lymphoma.  He presents for CT-guided bone marrow aspiration and core biopsy for staging. EXAM: CT GUIDED BONE MARROW ASPIRATION AND CORE BIOPSY Interventional Radiologist:  Criselda Peaches, MD MEDICATIONS: None. ANESTHESIA/SEDATION: Moderate (conscious) sedation was employed during this procedure. A total of 2 milligrams versed and 100 micrograms fentanyl were administered intravenously. The patient's level of consciousness and vital signs were monitored continuously by radiology nursing throughout the procedure under my direct supervision. Total monitored sedation time: 8 minutes  FLUOROSCOPY TIME:  None COMPLICATIONS: None immediate. Estimated blood loss: <25 mL PROCEDURE: Informed written consent was obtained from the patient after a thorough discussion of the procedural risks, benefits and alternatives. All questions were addressed. Maximal Sterile Barrier Technique was utilized including caps, mask, sterile gowns, sterile gloves, sterile drape, hand hygiene and skin antiseptic. A timeout was performed prior to the initiation of the procedure. The patient was positioned prone and non-contrast localization CT was performed of the pelvis to demonstrate the iliac marrow spaces. Maximal barrier sterile technique utilized including caps, mask, sterile gowns, sterile gloves, large sterile drape, hand hygiene, and betadine prep. Under sterile conditions and local anesthesia, an 11 gauge coaxial bone biopsy needle was advanced into the right iliac marrow space. Needle position was confirmed with CT imaging. Initially, bone marrow aspiration was performed. Next, the 11 gauge outer cannula was utilized to obtain a right iliac bone marrow core biopsy. Needle was removed. Hemostasis was obtained with compression. The patient tolerated the procedure well. Samples were prepared with the cytotechnologist. IMPRESSION: Technically successful CT-guided iliac bone marrow aspiration and core biopsy. Electronically Signed   By: Jacqulynn Cadet M.D.   On: 05/02/2020 10:06   [place] LT UPPER EXTREM LTD SOFT TISSUE NON VASCULAR  Result Date: 03/24/2020 CLINICAL DATA:  Palpable abnormality in the left axilla. Weight loss, anemia, weakness EXAM: ULTRASOUND LEFT UPPER EXTREMITY LIMITED TECHNIQUE: Ultrasound examination of the upper extremity soft tissues was performed in the area of clinical concern. COMPARISON:  None. FINDINGS: Targeted ultrasound was performed at site of patient's palpable abnormality within the left axilla. Corresponding to this site is a large heterogeneously hypoechoic solid soft tissue mass  within the subcutaneous soft tissues measuring approximately 6.8 x 2.0 x 3.7 cm. Internal vascularity is seen throughout the mass. Along the deep margin of this mass are multiple small morphologically normal appearing axillary lymph nodes. IMPRESSION: Large soft tissue mass within the left axilla measuring up to 6.8 cm with internal vascularity. Differential includes primary soft tissue neoplasm such as sarcoma versus a markedly enlarged axillary lymph node in the setting of lymphoma or other primary malignancy. Contrast enhanced CT of the chest is recommended for further evaluation. These results will be called to the ordering clinician or representative by the Radiologist Assistant, and communication documented in the PACS or [employer]. Electronically Signed   By: Davina Poke D.O.   On: 03/24/2020 08:52   [place] CORE BIOPSY (LYMPH NODES)  Result Date: 04/13/2020 INDICATION: No known primary, now with hypermetabolic left axillary nodal mass. Please perform ultrasound-guided biopsy for tissue diagnostic purposes. EXAM: ULTRASOUND-GUIDED LEFT AXILLARY LYMPH NODE BIOPSY COMPARISON:  PET-CT-04/12/2020; chest CT-03/30/2020 MEDICATIONS: None ANESTHESIA/SEDATION: None COMPLICATIONS: None immediate. TECHNIQUE: Informed written consent was obtained from the patient after a discussion of the risks, benefits and alternatives to treatment. Questions regarding the procedure were encouraged and answered. Initial ultrasound scanning demonstrated a large, at least 6.7 x 2.7 cm left axillary nodal conglomeration compatible with the findings seen on preceding PET-CT. An ultrasound image was saved for  documentation purposes. The procedure was planned. A timeout was performed prior to the initiation of the procedure. The operative was prepped and draped in the usual sterile fashion, and a sterile drape was applied covering the operative field. A timeout was performed prior to the initiation of the procedure. Local  anesthesia was provided with 1% lidocaine with epinephrine. Under direct ultrasound guidance, an 18 gauge core needle device was utilized to obtain to obtain 7 core needle biopsies of the hypermetabolic left axillary lymph node. The samples were placed in saline and submitted to pathology. The needle was removed and hemostasis was achieved with manual compression. Post procedure scan was negative for significant hematoma. A dressing was placed. The patient tolerated the procedure well without immediate postprocedural complication. IMPRESSION: Technically successful ultrasound guided biopsy of dominant hypermetabolic left axillary lymph node. Electronically Signed   By: Sandi Mariscal M.D.   On: 04/13/2020 15:49   ECHOCARDIOGRAM LIMITED  Result Date: 05/13/2020    ECHOCARDIOGRAM LIMITED REPORT   Patient Name:   AVRY Ertel  Date of Exam: 05/13/2020 Medical Rec #:  [id number]  Height:       69.0 in Accession #:    [id number] Weight:       147.0 lb Date of Birth:  08/25/1945  BSA:          1.813 m Patient Age:    23 years   BP:           93/63 mmHg Patient Gender: M          HR:           60 bpm. Exam Location:  [facility] Procedure: 2D Echo, Cardiac Doppler and Color Doppler Indications:     Chemotherapy evaluation V 87.41  History:         Patient has no prior history of Echocardiogram examinations. No                  heart history listed in chart.  Sonographer:     Sherrie Sport RDCS (AE) Referring Phys:  [id number] Clyde Upshaw Diagnosing Phys: Bartholome Bill MD IMPRESSIONS  1. Left ventricular ejection fraction, by estimation, is 50 to 55%. The left ventricle has low normal function. Left ventricular diastolic parameters were normal.  2. Right ventricular systolic function is normal. The right ventricular size is mildly enlarged.  3. Right atrial size was mildly dilated.  4. The mitral valve is grossly normal. Mild mitral valve regurgitation.  5. The aortic valve is tricuspid. Aortic valve regurgitation is mild. FINDINGS  Left  Ventricle: Left ventricular ejection fraction, by estimation, is 50 to 55%. The left ventricle has low normal function. The left ventricular internal cavity size was normal in size. There is no left ventricular hypertrophy. Left ventricular diastolic parameters were normal. Right Ventricle: The right ventricular size is mildly enlarged. No increase in right ventricular wall thickness. Right ventricular systolic function is normal. Left Atrium: Left atrial size was normal in size. Right Atrium: Right atrial size was mildly dilated. Pericardium: There is no evidence of pericardial effusion. Mitral Valve: The mitral valve is grossly normal. Mild mitral valve regurgitation. Tricuspid Valve: The tricuspid valve is grossly normal. Tricuspid valve regurgitation is mild. Aortic Valve: The aortic valve is tricuspid. Aortic valve regurgitation is mild. Aortic regurgitation PHT measures 499 msec. Aortic valve mean gradient measures 3.0 mmHg. Aortic valve peak gradient measures 5.1 mmHg. Aortic valve area, by VTI measures 2.56 cm. Pulmonic Valve: The pulmonic valve was not well visualized. Pulmonic  valve regurgitation is mild. Aorta: The aortic root is normal in size and structure. IAS/Shunts: The interatrial septum was not assessed. LEFT VENTRICLE PLAX 2D LVIDd:         5.08 cm  Diastology LVIDs:         3.55 cm  LV e' medial:    7.07 cm/s LV PW:         1.02 cm  LV E/e' medial:  8.7 LV IVS:        0.77 cm  LV e' lateral:   4.03 cm/s LVOT diam:     2.10 cm  LV E/e' lateral: 15.2 LV SV:         56 LV SV Index:   31 LVOT Area:     3.46 cm  RIGHT VENTRICLE RV Basal diam:  3.86 cm RV S prime:     12.70 cm/s TAPSE (M-mode): 3.5 cm LEFT ATRIUM             Index       RIGHT ATRIUM           Index LA diam:        3.30 cm 1.82 cm/m  RA Area:     19.00 cm LA Vol (A2C):   52.2 ml 28.80 ml/m RA Volume:   54.60 ml  30.12 ml/m LA Vol (A4C):   22.9 ml 12.63 ml/m LA Biplane Vol: 36.4 ml 20.08 ml/m  AORTIC VALVE                    PULMONIC VALVE AV Area (Vmax):    2.03 cm    PV Vmax:        0.69 m/s AV Area (Vmean):   2.01 cm    PV Peak grad:   1.9 mmHg AV Area (VTI):     2.56 cm    RVOT Peak grad: 2 mmHg AV Vmax:           112.50 cm/s AV Vmean:          78.500 cm/s AV VTI:            0.218 m AV Peak Grad:      5.1 mmHg AV Mean Grad:      3.0 mmHg LVOT Vmax:         65.90 cm/s LVOT Vmean:        45.500 cm/s LVOT VTI:          0.161 m LVOT/AV VTI ratio: 0.74 AI PHT:            499 msec  AORTA Ao Root diam: 3.00 cm MITRAL VALVE               TRICUSPID VALVE MV Area (PHT): 3.93 cm    TR Peak grad:   28.9 mmHg MV Decel Time: 193 msec    TR Vmax:        269.00 cm/s MV E velocity: 61.30 cm/s MV A velocity: 66.80 cm/s  SHUNTS MV E/A ratio:  0.92        Systemic VTI:  0.16 m                            Systemic Diam: 2.10 cm Bartholome Bill MD Electronically signed by Bartholome Bill MD Signature Date/Time: 05/13/2020/12:58:21 PM    Final       ASSESSMENT & PLAN:  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 AIDS (acquired immune  deficiency syndrome) ([place])   3. 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 Left axillary mass final pathology is high-grade B-cell lymphoma, nonspecific type.   CD 20+, MYC+, BCL2-, BCL-6+, Cyclin D1-, Ki-67 >95% Status post cycle 1dose adjusted EPOCH with rituximab. Labs are monitored closely. Stable uric acid, phosphorus and potassium, kidney function No signs of tumor lysis syndrome. Encourage oral hydration.  Leukocytosis, I believe this is from G-CSF. Goals of care, curative. Patient is at high risk of developing CNS disease.  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  I recommend CNS prophylactic therapy.  Patient will receive intrathecal methotrexate tomorrow.   Prophylaxis -Continue Bactrim daily. -Continue acyclovir 400 mg twice daily -Continue allopurinol 300 mg twice daily daily   # AIDS patient follows  with ID physician.  Has been on antiretroviral therapy with Biktarvy  Supportive care measures are necessary for patient well-being and will be provided as necessary. We spent sufficient time to discuss many aspect of care, questions were answered to patient and wife's satisfaction.    Orders Placed This Encounter  Procedures  . DG FLUORO GUIDED LOC OF NEEDLE/CATH TIP FOR SPINAL INJECT LT    Standing Status:   Future    Standing Expiration Date:   06/13/2021    Order Specific Question:   Lab orders requested (DO NOT place separate lab orders, these will be automatically ordered during procedure specimen collection):    Answer:   None    Order Specific Question:   Reason for Exam (SYMPTOM  OR DIAGNOSIS REQUIRED)    Answer:   High Grade B-cell lymphoma, needs intrathecal methotrexate    Order Specific Question:   Preferred Imaging Location?    Answer:   [facility]    Order Specific Question:   Radiology Contrast Protocol - do NOT remove file path    Answer:   [email or web address]    All questions were answered. The patient knows to call the clinic with any problems questions or concerns.  cc Nobie Putnam *  Follow-up in 2 weeks for cycle 2 treatments.  Earlie Server, MD, PhD Hematology Oncology [facility] at [facility] Pager- [phone] 06/13/2020

## 2020-06-13 NOTE — Progress Notes (Signed)
Pt tolerated infusion well with no signs of complications. VSS. Pt stable for discharge. Pt discharged home.   Yashas Camilli [employer]

## 2020-06-13 NOTE — Telephone Encounter (Signed)
I was informed that it was OK to proceed with Neulasta.

## 2020-06-14 ENCOUNTER — Other Ambulatory Visit: Payer: Self-pay

## 2020-06-14 ENCOUNTER — Telehealth: Payer: Self-pay

## 2020-06-14 ENCOUNTER — Ambulatory Visit
Admission: RE | Admit: 2020-06-14 | Discharge: 2020-06-14 | Disposition: A | Discharge status: Home / Self Care | Payer: Medicare HMO | Source: Ambulatory Visit | Attending: Oncology | Admitting: Oncology

## 2020-06-14 ENCOUNTER — Inpatient Hospital Stay: Payer: Medicare HMO

## 2020-06-14 DIAGNOSIS — K900 Celiac disease: Secondary | ICD-10-CM | POA: Diagnosis not present

## 2020-06-14 DIAGNOSIS — C8514 Unspecified B-cell lymphoma, lymph nodes of axilla and upper limb: Secondary | ICD-10-CM | POA: Diagnosis not present

## 2020-06-14 DIAGNOSIS — C8510 Unspecified B-cell lymphoma, unspecified site: Secondary | ICD-10-CM | POA: Insufficient documentation

## 2020-06-14 DIAGNOSIS — D649 Anemia, unspecified: Secondary | ICD-10-CM | POA: Diagnosis not present

## 2020-06-14 DIAGNOSIS — Z5189 Encounter for other specified aftercare: Secondary | ICD-10-CM | POA: Diagnosis not present

## 2020-06-14 DIAGNOSIS — B20 Human immunodeficiency virus [HIV] disease: Secondary | ICD-10-CM | POA: Diagnosis not present

## 2020-06-14 DIAGNOSIS — C8330 Diffuse large B-cell lymphoma, unspecified site: Secondary | ICD-10-CM | POA: Diagnosis not present

## 2020-06-14 DIAGNOSIS — Z5111 Encounter for antineoplastic chemotherapy: Secondary | ICD-10-CM | POA: Diagnosis not present

## 2020-06-14 DIAGNOSIS — Z5112 Encounter for antineoplastic immunotherapy: Secondary | ICD-10-CM | POA: Diagnosis not present

## 2020-06-14 LAB — CSF CELL COUNT WITH DIFFERENTIAL
Eosinophils, CSF: 0 %
Lymphs, CSF: 97 %
Monocyte-Macrophage-Spinal Fluid: 3 %
RBC Count, CSF: 0 /mm3 (ref 0–3)
Segmented Neutrophils-CSF: 0 %
Tube #: 2
WBC, CSF: 31 /mm3 (ref 0–5)

## 2020-06-14 LAB — CBC
HCT: 37.2 % — ABNORMAL LOW (ref 39.0–52.0)
Hemoglobin: 12.7 g/dL — ABNORMAL LOW (ref 13.0–17.0)
MCH: 29.9 pg (ref 26.0–34.0)
MCHC: 34.1 g/dL (ref 30.0–36.0)
MCV: 87.5 fL (ref 80.0–100.0)
Platelets: 131 10*3/uL — ABNORMAL LOW (ref 150–400)
RBC: 4.25 MIL/uL (ref 4.22–5.81)
RDW: 15.7 % — ABNORMAL HIGH (ref 11.5–15.5)
WBC: 7.1 10*3/uL (ref 4.0–10.5)
nRBC: 0 % (ref 0.0–0.2)

## 2020-06-14 LAB — PROTIME-INR
INR: 1 (ref 0.8–1.2)
Prothrombin Time: 12.6 seconds (ref 11.4–15.2)

## 2020-06-14 LAB — PATHOLOGIST SMEAR REVIEW

## 2020-06-14 LAB — APTT: aPTT: 26 seconds (ref 24–36)

## 2020-06-14 LAB — PROTEIN, CSF: Total  Protein, CSF: 56 mg/dL — ABNORMAL HIGH (ref 15–45)

## 2020-06-14 LAB — GLUCOSE, CSF: Glucose, CSF: 45 mg/dL (ref 40–70)

## 2020-06-14 MED ORDER — ACETAMINOPHEN 500 MG PO TABS: 1000.0000 mg | ORAL_TABLET | Freq: Four times a day (QID) | ORAL | Status: DC | PRN

## 2020-06-14 MED ORDER — SODIUM CHLORIDE (PF) 0.9 % IJ SOLN
Freq: Once | INTRAMUSCULAR | Status: DC
  Filled 2020-06-14: qty 0.48

## 2020-06-14 MED ORDER — ACETAMINOPHEN 500 MG PO TABS
ORAL_TABLET | ORAL | Status: AC
  Administered 2020-06-14: 1000 mg via ORAL
  Filled 2020-06-14: qty 2

## 2020-06-14 MED ORDER — IOHEXOL 300 MG/ML  SOLN: 2.0000 mL | Freq: Once | INTRAMUSCULAR | Status: DC

## 2020-06-14 MED ORDER — LIDOCAINE HCL (PF) 1 % IJ SOLN
5.0000 mL | Freq: Once | INTRAMUSCULAR | Status: AC
  Administered 2020-06-14: 5 mL
  Filled 2020-06-14: qty 5

## 2020-06-14 NOTE — Discharge Instructions (Signed)
Myelogram  A myelogram is an imaging test. This test checks for problems in the spinal cord and the places where nerves attach to the spinal cord (nerve roots). A dye (contrast material) is put into your spine before the X-ray. This provides a clearer image for your doctor to see. You may need this test if you have a spinal cord problem that cannot be diagnosed with other imaging tests. You may also have this test to check your spine after surgery. Tell a doctor about:  Any allergies you have, especially to iodine.  All medicines you are taking, including vitamins, herbs, eye drops, creams, and over-the-counter medicines.  Any problems you or family members have had with anesthetic medicines or dye.  Any blood disorders you have.  Any surgeries you have had.  Any medical conditions you have or have had, including asthma.  Whether you are pregnant or may be pregnant. What are the risks? Generally, this is a safe procedure. However, problems may occur, including:  Infection.  Bleeding.  Allergic reaction to medicines or dyes.  Damage to your spinal cord or nerves.  Leaking of spinal fluid. This can cause a headache.  Damage to kidneys.  Seizures. This is rare. What happens before the procedure?  Follow instructions from your doctor about what you cannot eat or drink. You may be asked to drink more fluids.  Ask your doctor about changing or stopping your normal medicines. This is important if you take diabetes medicines or blood thinners.  Plan to have someone take you home from the hospital or clinic.  If you will be going home right after the procedure, plan to have someone with you for 24 hours. What happens during the procedure?  You will lie face down on a table.  Your doctor will find the best injection site on your spine. This is most often in the lower back.  This area will be washed with soap.  You will be given a medicine to numb the area (local  anesthetic).  Your doctor will place a long needle into the space around your spinal cord.  A sample of spinal fluid may be taken. This may be sent to the lab for testing.  The dye will be injected into the space around your spinal cord.  The exam table may be tilted. This helps the dye flow up or down your spine.  The X-ray will take images of your spinal cord.  A bandage (dressing) may be placed over the area where the dye was injected. The procedure may vary among doctors and hospitals. What can I expect after the procedure?  You may be monitored until you leave the hospital or clinic. This includes checking your blood pressure, heart rate, breathing rate, and blood oxygen level.  You may feel sore at the injection site. You may have a mild headache.  You will be told to lie flat with your head raised (elevated). This lowers the risk of a headache.  It is up to you to get the results of your procedure. Ask your doctor, or the department that is doing the procedure, when your results will be ready. Follow these instructions at home:   Rest as told by your doctor. Lie flat with your head slightly elevated.  Do not bend, lift, or do hard work for 24-48 hours, or as told by your doctor.  Take over-the-counter and prescription medicines only as told by your doctor.  Take care of your bandage as told by your  doctor.  Drink enough fluid to keep your pee (urine) pale yellow.  Bathe or shower as told by your doctor. Contact a doctor if:  You have a fever.  You have a headache that lasts longer than 24 hours.  You feel sick to your stomach (nauseous).  You vomit.  Your neck is stiff.  Your legs feel numb.  You cannot pee.  You cannot poop (no bowel movement).  You have a rash.  You are itchy or sneezing. Get help right away if:  You have new symptoms or your symptoms get worse.  You have a seizure.  You have trouble breathing. Summary  A myelogram is an  imaging test that checks for problems in the spinal cord and the places where nerves attach to the spinal cord (nerve roots).  Before the procedure, follow instructions from your doctor. You will be told what not to eat or drink, or what medicines to change or stop.  After the procedure, you will be told to lie flat with your head raised (elevated). This will lower your risk of a headache.  Do not bend, lift, or do any hard work for 24-48 hours, or as told by your doctor.  Contact a doctor if you have a stiff neck or numb legs. Get help right away if your symptoms get worse, or you have a seizure or trouble breathing. This information is not intended to replace advice given to you by your health care provider. Make sure you discuss any questions you have with your health care provider. Document Revised: 09/24/2018 Document Reviewed: 09/25/2018 Elsevier Patient Education  [place].

## 2020-06-14 NOTE — Addendum Note (Signed)
Addended by: Earlie Server on: 06/14/2020 02:27 PM   Modules accepted: Orders

## 2020-06-14 NOTE — Telephone Encounter (Signed)
Done

## 2020-06-14 NOTE — Progress Notes (Signed)
Pt given juice and jello per request. Pt in NAD, lying flat at this time. Will continue to monitor x4hrs per Dr. Rodrigo Ran

## 2020-06-14 NOTE — Telephone Encounter (Signed)
Pt scheduled for second cycle of intrathecal methotrexate in IR on 12/7 @ 9:30. Please add injection encounter on same day around 8 am (for medication release purpose only) and put in scheduling notes:           TO BE DONE IN IR (intrathecal methotrexate). Call     main pharmacy for pickup and delivery to IR once medicaiton is ready.

## 2020-06-14 NOTE — Progress Notes (Signed)
Lab called again at this time to check on APTT result. Per lab aprox 15-20 more min on the machine

## 2020-06-14 NOTE — Progress Notes (Signed)
Pt given discharge instruction and verbalizes understanding. PT in NAD, VS. Pt has until 1630 and pt can leave, pt verbalizes understanding.

## 2020-06-14 NOTE — Progress Notes (Signed)
Lab called at this time to check on results. Per lab delay due to issues with machine

## 2020-06-14 NOTE — Progress Notes (Signed)
PT updated and made aware of lab result delay. Pt verbalizes understanding.

## 2020-06-14 NOTE — Progress Notes (Signed)
Pt. stable after lp/ intrathecal MTX administration.Back stable.D/c instructions given to pt.F/u with his MD.THankyou

## 2020-06-15 ENCOUNTER — Encounter
Admission: RE | Admit: 2020-06-15 | Discharge: 2020-06-15 | Disposition: A | Discharge status: Home / Self Care | Payer: Medicare HMO | Source: Ambulatory Visit | Attending: Cardiovascular Disease | Admitting: Cardiovascular Disease

## 2020-06-15 ENCOUNTER — Other Ambulatory Visit: Payer: Self-pay | Admitting: *Deleted

## 2020-06-15 DIAGNOSIS — I420 Dilated cardiomyopathy: Secondary | ICD-10-CM | POA: Insufficient documentation

## 2020-06-15 DIAGNOSIS — B20 Human immunodeficiency virus [HIV] disease: Secondary | ICD-10-CM

## 2020-06-15 LAB — NM MYOCAR MULTI W/SPECT W/WALL MOTION / EF
Estimated workload: 1 METS
Exercise duration (min): 0 min
Exercise duration (sec): 0 s
LV dias vol: 114 mL (ref 62–150)
LV sys vol: 55 mL
MPHR: 149 {beats}/min
Peak HR: 97 {beats}/min
Percent HR: 65 %
Rest HR: 69 {beats}/min
SDS: 0
SRS: 1
SSS: 1
TID: 0.84

## 2020-06-15 MED ORDER — TECHNETIUM TC 99M TETROFOSMIN IV KIT
30.0000 | PACK | Freq: Once | INTRAVENOUS | Status: AC | PRN
  Administered 2020-06-15: 30.81 via INTRAVENOUS

## 2020-06-15 MED ORDER — REGADENOSON 0.4 MG/5ML IV SOLN
0.4000 mg | Freq: Once | INTRAVENOUS | Status: AC
  Administered 2020-06-15: 0.4 mg via INTRAVENOUS

## 2020-06-15 MED ORDER — TECHNETIUM TC 99M TETROFOSMIN IV KIT
10.0000 | PACK | Freq: Once | INTRAVENOUS | Status: AC | PRN
  Administered 2020-06-15: 10.494 via INTRAVENOUS

## 2020-06-16 ENCOUNTER — Encounter: Payer: Self-pay | Admitting: Family Medicine

## 2020-06-16 ENCOUNTER — Ambulatory Visit (INDEPENDENT_AMBULATORY_CARE_PROVIDER_SITE_OTHER): Payer: Medicare HMO | Admitting: Family Medicine

## 2020-06-16 ENCOUNTER — Other Ambulatory Visit: Payer: Self-pay

## 2020-06-16 VITALS — BP 122/55 | HR 85 | Temp 98.6°F | Resp 17 | Ht 69.0 in | Wt 165.2 lb

## 2020-06-16 DIAGNOSIS — K5900 Constipation, unspecified: Secondary | ICD-10-CM | POA: Diagnosis not present

## 2020-06-16 DIAGNOSIS — B20 Human immunodeficiency virus [HIV] disease: Secondary | ICD-10-CM

## 2020-06-16 DIAGNOSIS — C8510 Unspecified B-cell lymphoma, unspecified site: Secondary | ICD-10-CM | POA: Diagnosis not present

## 2020-06-16 LAB — COMP PANEL: LEUKEMIA/LYMPHOMA

## 2020-06-16 NOTE — Progress Notes (Signed)
Subjective:    Patient ID: Justin Wallace, male    DOB: March 28, 1946, 74 y.o.   MRN: [id number]  Justin Wallace is a 75 y.o. male presenting on 06/16/2020 for Fatigue (pt notice moderate improvement w/ his GI issues and the feeling of fatigue since last visit. Pt started chemotherapy last week for lymphoma)   HPI   Followed by specialists Oncology - Dr Earlie Server [facility]) Infectious Disease - Dr Jule Ser ([place]) Cardiology - Dr Ida Rogue ([facility] [place])  High Grade B-Cell Lymphoma HIV, AIDS - associated lymphoma  Today doing well with current treatments. He has port, has received chemotherapy series and continues to receive treatment.  Admits hand bilateral weakness and mild edema swelling - recently finished prednisone. For HIV he is on Biktarvy antiviral therapy  Constipation Hemorrhoids Secondary to chemotherapy Reports new problem with constipation Tried OTC preparation H. 1 week ago he did an enema treatment 2-3 times to get bowel movements.  ECHO showed abnormal valve - followed up with stress test was normal.   Depression screen [facility] 2/9 05/18/2020 03/10/2020  Decreased Interest 0 0  Down, Depressed, Hopeless 0 0  PHQ - 2 Score 0 0    Social History   Tobacco Use  . Smoking status: Never Smoker  . Smokeless tobacco: Never Used  Vaping Use  . Vaping Use: Never used  Substance Use Topics  . Alcohol use: Yes    Comment: occasional  . Drug use: Never    Review of Systems Per HPI unless specifically indicated above     Objective:    BP (!) 122/55 (BP Location: Right Arm, Patient Position: Sitting, Cuff Size: Normal)   Pulse 85   Temp 98.6 F (37 C) (Oral)   Resp 17   Ht 5' 9"  (1.753 m)   Wt 165 lb 3.2 oz (74.9 kg)   SpO2 100%   BMI 24.40 kg/m   Wt Readings from Last 3 Encounters:  06/16/20 165 lb 3.2 oz (74.9 kg)  06/13/20 176 lb 6.4 oz (80 kg)  06/07/20 170 lb (77.1 kg)    Physical Exam Vitals and nursing note reviewed.  Constitutional:       General: He is not in acute distress.    Appearance: He is well-developed. He is not diaphoretic.     Comments: Well-appearing, comfortable, cooperative  HENT:     Head: Normocephalic and atraumatic.  Eyes:     General:        Right eye: No discharge.        Left eye: No discharge.     Conjunctiva/sclera: Conjunctivae normal.  Neck:     Thyroid: No thyromegaly.  Cardiovascular:     Rate and Rhythm: Normal rate and regular rhythm.     Heart sounds: Normal heart sounds. No murmur heard.   Pulmonary:     Effort: Pulmonary effort is normal. No respiratory distress.     Breath sounds: Normal breath sounds. No wheezing or rales.  Musculoskeletal:        General: Normal range of motion.     Cervical back: Normal range of motion and neck supple.  Lymphadenopathy:     Cervical: No cervical adenopathy.  Skin:    General: Skin is warm and dry.     Findings: No erythema or rash.  Neurological:     Mental Status: He is alert and oriented to person, place, and time.  Psychiatric:        Behavior: Behavior normal.  Comments: Well groomed, good eye contact, normal speech and thoughts      Stress Test Nuclear Med  NM MYO MULT SPECT W/WALL 1DAY 06/15/2020 [place] [facility] Results  Procedure Component Value Ref Range Date/Time  NM Myocar Multi W/Spect W/Wall Motion / EF [[id number]] Resulted: 06/15/20 1436  Order Status: Completed Updated: 06/15/20 1438   Rest HR 69 bpm    Rest BP 113/59 mmHg    Exercise duration (sec) 0 sec    Percent HR 65 %    Exercise duration (min) 0 min    Estimated workload 1.0 METS    Peak HR 97 bpm    Peak BP 109/62 mmHg    MPHR 149 bpm    SSS 1   SRS 1   SDS 0   TID 0.84   LV sys vol 55 mL    LV dias vol 114 62 - 150 mL   Narrative:    There was no ST segment deviation noted during stress.   The study is normal.   This is a low risk study.   The left ventricular ejection fraction is low normal (51%).    There is no evidence for ischemia        Results for orders placed or performed during the hospital encounter of 06/15/20  NM Myocar Multi W/Spect W/Wall Motion / EF  Result Value Ref Range   Rest HR 69 bpm   Rest BP 113/59 mmHg   Exercise duration (sec) 0 sec   Percent HR 65 %   Exercise duration (min) 0 min   Estimated workload 1.0 METS   Peak HR 97 bpm   Peak BP 109/62 mmHg   MPHR 149 bpm   SSS 1    SRS 1    SDS 0    TID 0.84    LV sys vol 55 mL   LV dias vol 114 62 - 150 mL      Assessment & Plan:   Problem List Items Addressed This Visit    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HCC) - Primary   AIDS (acquired immune deficiency syndrome) ([place])    Other Visit Diagnoses    Constipation, unspecified constipation type         #Lymphoma, high grade B Cell Managed by [facility] Oncology Dr Carma Leaven chemotherapy Recent labs reviewed. Anemia stable to improved  #AIDS, HIV Managed by [facility] Dr Juleen [place], on Bactrim prophylaxis  #Constipation #Hemorrhoids Likely secondary side effect from meds and recent changes Trial on Miralax now OTC 1-2 cap daily can adjust dose and use PRN dosing if need.  May use preparation H still, treat underlying constipation first.  No orders of the defined types were placed in this encounter.     Follow up plan: Return in about 4 months (around 10/14/2020) for 4 month follow-up updates from specialists.   Nobie Putnam, DO [facility] 06/16/2020, 8:15 AM

## 2020-06-16 NOTE — Patient Instructions (Addendum)
Thank you for coming to the office today.  Keep up with your specialists.  For hemorrhoids can continue preparation H as needed.  For Constipation (less frequent bowel movement that can be hard dry or involve straining).  Recommend trying OTC Miralax 17g = 1 capful in large glass water once daily for now, try several days to see if working, goal is soft stool or BM 1-2 times daily, if too loose then reduce dose or try every other day. If not effective may need to increase it to 2 doses at once in AM or may do 1 in morning and 1 in afternoon/evening  - This medicine is very safe and can be used often without any problem and will not make you dehydrated. It is good for use on AS NEEDED BASIS or even MAINTENANCE therapy for longer term for several days to weeks at a time to help regulate bowel movements  Other more natural remedies or preventative treatment: - Increase hydration with water - Increase fiber in diet (high fiber foods = vegetables, leafy greens, oats/grains) - May take OTC Fiber supplement (metamucil powder or pill/gummy) - May try OTC Probiotic   Please schedule a Follow-up Appointment to: Return in about 4 months (around 10/14/2020) for 4 month follow-up updates from specialists.  If you have any other questions or concerns, please feel free to call the office or send a message through [place]. You may also schedule an earlier appointment if necessary.  Additionally, you may be receiving a survey about your experience at our office within a few days to 1 week by e-mail or mail. We value your feedback.  Nobie Putnam, DO [place]

## 2020-06-17 MED ORDER — SULFAMETHOXAZOLE-TRIMETHOPRIM 800-160 MG PO TABS: 1.0000 | ORAL_TABLET | Freq: Every day | ORAL | 2 refills | Status: DC

## 2020-06-17 NOTE — Telephone Encounter (Signed)
Received MyChart message from patient stating that his refill for his Bactrim was blocked. RN called [facility] and spoke with Joe who states a new prescription will need to be sent in order to reactivate. RN sent in refill per Dr. Alcario Drought last note.   Beryle Flock, RN

## 2020-06-20 ENCOUNTER — Telehealth: Payer: Self-pay | Admitting: Cardiovascular Disease

## 2020-06-20 NOTE — Telephone Encounter (Signed)
Attempted to call the patient. No answer- I left a message to please call back.  

## 2020-06-20 NOTE — Telephone Encounter (Signed)
Minna Merritts, MD  06/19/2020 11:44 AM EST     Stress test  No ischemia, low risk study Looks good overall

## 2020-06-21 NOTE — Telephone Encounter (Signed)
Attempted to call pt with results. No answer. LMOM TCB.

## 2020-06-21 NOTE — Telephone Encounter (Signed)
Pt returned call. Notified of results of stress test. All questions were answered.

## 2020-06-27 ENCOUNTER — Inpatient Hospital Stay: Payer: Medicare HMO

## 2020-06-27 ENCOUNTER — Other Ambulatory Visit: Payer: Self-pay | Admitting: Pharmacist

## 2020-06-27 ENCOUNTER — Other Ambulatory Visit: Payer: Self-pay

## 2020-06-27 ENCOUNTER — Encounter: Payer: Self-pay | Admitting: Oncology

## 2020-06-27 ENCOUNTER — Other Ambulatory Visit: Payer: Medicare HMO

## 2020-06-27 ENCOUNTER — Inpatient Hospital Stay (HOSPITAL_BASED_OUTPATIENT_CLINIC_OR_DEPARTMENT_OTHER): Payer: Medicare HMO | Admitting: Oncology

## 2020-06-27 ENCOUNTER — Telehealth: Payer: Self-pay

## 2020-06-27 VITALS — BP 110/69 | HR 76 | Temp 97.5°F | Wt 166.4 lb

## 2020-06-27 DIAGNOSIS — B20 Human immunodeficiency virus [HIV] disease: Secondary | ICD-10-CM | POA: Diagnosis not present

## 2020-06-27 DIAGNOSIS — C8510 Unspecified B-cell lymphoma, unspecified site: Secondary | ICD-10-CM

## 2020-06-27 DIAGNOSIS — D649 Anemia, unspecified: Secondary | ICD-10-CM | POA: Diagnosis not present

## 2020-06-27 DIAGNOSIS — C8512 Unspecified B-cell lymphoma, intrathoracic lymph nodes: Secondary | ICD-10-CM | POA: Diagnosis not present

## 2020-06-27 DIAGNOSIS — T451X5A Adverse effect of antineoplastic and immunosuppressive drugs, initial encounter: Secondary | ICD-10-CM | POA: Insufficient documentation

## 2020-06-27 DIAGNOSIS — C8514 Unspecified B-cell lymphoma, lymph nodes of axilla and upper limb: Secondary | ICD-10-CM | POA: Diagnosis not present

## 2020-06-27 DIAGNOSIS — Z5111 Encounter for antineoplastic chemotherapy: Secondary | ICD-10-CM | POA: Diagnosis not present

## 2020-06-27 DIAGNOSIS — D6481 Anemia due to antineoplastic chemotherapy: Secondary | ICD-10-CM

## 2020-06-27 DIAGNOSIS — Z5112 Encounter for antineoplastic immunotherapy: Secondary | ICD-10-CM | POA: Diagnosis not present

## 2020-06-27 DIAGNOSIS — Z5189 Encounter for other specified aftercare: Secondary | ICD-10-CM | POA: Diagnosis not present

## 2020-06-27 DIAGNOSIS — K900 Celiac disease: Secondary | ICD-10-CM | POA: Diagnosis not present

## 2020-06-27 LAB — URIC ACID: Uric Acid, Serum: 3.6 mg/dL — ABNORMAL LOW (ref 3.7–8.6)

## 2020-06-27 LAB — CBC WITH DIFFERENTIAL/PLATELET
Abs Immature Granulocytes: 1.76 10*3/uL — ABNORMAL HIGH (ref 0.00–0.07)
Basophils Absolute: 0 10*3/uL (ref 0.0–0.1)
Basophils Relative: 0 %
Eosinophils Absolute: 0.1 10*3/uL (ref 0.0–0.5)
Eosinophils Relative: 2 %
HCT: 33 % — ABNORMAL LOW (ref 39.0–52.0)
Hemoglobin: 11.1 g/dL — ABNORMAL LOW (ref 13.0–17.0)
Immature Granulocytes: 25 %
Lymphocytes Relative: 13 %
Lymphs Abs: 0.9 10*3/uL (ref 0.7–4.0)
MCH: 30.4 pg (ref 26.0–34.0)
MCHC: 33.6 g/dL (ref 30.0–36.0)
MCV: 90.4 fL (ref 80.0–100.0)
Monocytes Absolute: 0.7 10*3/uL (ref 0.1–1.0)
Monocytes Relative: 10 %
Neutro Abs: 3.7 10*3/uL (ref 1.7–7.7)
Neutrophils Relative %: 50 %
Platelets: 381 10*3/uL (ref 150–400)
RBC: 3.65 MIL/uL — ABNORMAL LOW (ref 4.22–5.81)
RDW: 16.7 % — ABNORMAL HIGH (ref 11.5–15.5)
Smear Review: NORMAL
WBC: 7.2 10*3/uL (ref 4.0–10.5)
nRBC: 0 % (ref 0.0–0.2)

## 2020-06-27 LAB — COMPREHENSIVE METABOLIC PANEL
ALT: 20 U/L (ref 0–44)
AST: 21 U/L (ref 15–41)
Albumin: 3.1 g/dL — ABNORMAL LOW (ref 3.5–5.0)
Alkaline Phosphatase: 113 U/L (ref 38–126)
Anion gap: 8 (ref 5–15)
BUN: 19 mg/dL (ref 8–23)
CO2: 25 mmol/L (ref 22–32)
Calcium: 8.6 mg/dL — ABNORMAL LOW (ref 8.9–10.3)
Chloride: 105 mmol/L (ref 98–111)
Creatinine, Ser: 0.97 mg/dL (ref 0.61–1.24)
GFR, Estimated: >60 mL/min (ref >60)
Glucose, Bld: 97 mg/dL (ref 70–99)
Potassium: 4.1 mmol/L (ref 3.5–5.1)
Sodium: 138 mmol/L (ref 135–145)
Total Bilirubin: 0.2 mg/dL — ABNORMAL LOW (ref 0.3–1.2)
Total Protein: 6.4 g/dL — ABNORMAL LOW (ref 6.5–8.1)

## 2020-06-27 LAB — PHOSPHORUS: Phosphorus: 4.1 mg/dL (ref 2.5–4.6)

## 2020-06-27 MED ORDER — SODIUM CHLORIDE 0.9 % IV SOLN
20.0000 mg | Freq: Once | INTRAVENOUS | Status: AC
  Administered 2020-06-27: 20 mg via INTRAVENOUS
  Filled 2020-06-27: qty 20

## 2020-06-27 MED ORDER — VINCRISTINE SULFATE CHEMO INJECTION 1 MG/ML
Freq: Once | INTRAVENOUS | Status: DC
  Filled 2020-06-27: qty 11

## 2020-06-27 MED ORDER — DIPHENHYDRAMINE HCL 25 MG PO CAPS
50.0000 mg | ORAL_CAPSULE | Freq: Once | ORAL | Status: AC
  Administered 2020-06-27: 50 mg via ORAL
  Filled 2020-06-27: qty 2

## 2020-06-27 MED ORDER — SODIUM CHLORIDE 0.9 % IV SOLN
Freq: Once | INTRAVENOUS | Status: AC
  Filled 2020-06-27: qty 250

## 2020-06-27 MED ORDER — SODIUM CHLORIDE 0.9 % IV SOLN
375.0000 mg/m2 | Freq: Once | INTRAVENOUS | Status: AC
  Administered 2020-06-27: 700 mg via INTRAVENOUS
  Filled 2020-06-27: qty 50

## 2020-06-27 MED ORDER — SODIUM CHLORIDE 0.9 % IV SOLN: 375.0000 mg/m2 | Freq: Once | INTRAVENOUS | Status: DC

## 2020-06-27 MED ORDER — ACETAMINOPHEN 325 MG PO TABS
650.0000 mg | ORAL_TABLET | Freq: Once | ORAL | Status: AC
  Administered 2020-06-27: 650 mg via ORAL
  Filled 2020-06-27: qty 2

## 2020-06-27 MED ORDER — DIPHENHYDRAMINE HCL 25 MG PO CAPS
ORAL_CAPSULE | ORAL | Status: AC
  Filled 2020-06-27: qty 1

## 2020-06-27 MED ORDER — PALONOSETRON HCL INJECTION 0.25 MG/5ML
0.2500 mg | Freq: Once | INTRAVENOUS | Status: AC
  Administered 2020-06-27: 0.25 mg via INTRAVENOUS
  Filled 2020-06-27: qty 5

## 2020-06-27 MED ORDER — ALLOPURINOL 300 MG PO TABS
300.0000 mg | ORAL_TABLET | Freq: Two times a day (BID) | ORAL | 0 refills | Status: DC
Start: 2020-06-27 — End: 2020-08-15

## 2020-06-27 NOTE — Progress Notes (Signed)
Doses increased per MD

## 2020-06-27 NOTE — Telephone Encounter (Signed)
Order for intrathecal methotrexate entered and request faxed to specialty scheduling.  Needs to be scheduled on 12/28 to coincide with his scheduled December treatments.

## 2020-06-27 NOTE — Progress Notes (Signed)
Pt tolerated all infusions today.  Brisk blood return noted from [facility] prior to initiation of prolonged chemo infusion pump.  Pt left chemo suite stable and ambulatory with his pump infusing as ordered.

## 2020-06-27 NOTE — Progress Notes (Signed)
Monitor therapy with Acyclovir and Biktarvy.  Acyclovir may increase the concentration of Biktarvy.

## 2020-06-27 NOTE — Progress Notes (Signed)
Hematology/Oncology follow up note [facility] Telephone:([phone] [phone]   Patient Care Team: Justin Hauser, DO as PCP - General (Family Medicine) Justin Server, MD as Consulting Physician (Hematology and Oncology)  REFERRING PROVIDER: Nobie Wallace *  CHIEF COMPLAINTS/REASON FOR VISIT:  Follow up for axillary mass  HISTORY OF PRESENTING ILLNESS:   Justin Wallace is a  74 y.o.  male with PMH listed below was seen in consultation at the request of  Justin Wallace *  for evaluation of symptomatic anemia Patient reported history of sudden onset of diffuse body aches, decreased appetite, headache low-grade fever, profound weakness/fatigue about 2 weeks ago.  Prior to the onset of symptoms, he went to a funeral as well as applicable clinic.  He reports that he has received COVID-19 vaccination previously.  He moved from [place].  Daughter is an Therapist, sports and told him to get COVID-19 PCR checked and he was tested negative. Patient also reports a sudden drop of weight since the onset of symptoms.  Patient has a history of celiac disease.  No colonoscopy records or pathology records in current EMR.  Diagnosis was done by [facility] gastro[facility].  Patient reports that he was in his usual state of health until the onset of symptoms.  03/10/2020, patient was evaluated by primary care provider.  Blood work showed mild anemia with hemoglobin of 13.1, Iron panel showed decreased saturation of 8, ferritin 218, TIBC 291, patient was referred to hematology for evaluation of symptomatic anemia and iron deficiency.  Patient reports that her body aches, fever and headache symptoms have improved.  However he continues to feel very tired and fatigued.  Appetite has decreased and weight remains low.  # Physical examination showed left axillary mass  ultrasound confirmed a left axillary large soft tissue mass up to 6.8 cm with internal vascularity. 03/30/2020  CT chest with contrast showed soft tissue mass/enlarged lymph node in the left axilla 6.3 cm, concerning for primary mass or metastatic lymphadenopathy.  There are additional prominent although much smaller right axillary lymph nodes, pretracheal and superior mediastinal lymph nodes.  Findings are concerning for malignancy such as lymphoma or nodal metastatic disease.  # treated for Lymes disease, finished doxycycline 162m BID for 21 days course. 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 he has started treatment with Biktarvy, also started on Bactrim daily.  04/26/2020 PET scan showed large intensely hypermetabolic left axillary mass SUV 28.  Additional small bilateral axilla lymph node with mild metabolic activity SUV 2.  Metabolic activity through the porta hepatis region without enlarged nodes identified.  No bone lesions. 05/02/2020 bone marrow biopsy showed hypercellular marrow for age with trilineage hematopoiesis.  Increased number of CD10 positive B cells, cannot rule out minimal or subtle involvement of marrow by the high-grade B-cell lymphoma. Normal cytogenetics. 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  Positive for BCL6 and MYC expression.  FISH testing showed negative for BCL-2/BCL6/MYC gene rearrangement.   Trisomy 3 and 18 were identified and are nonspecific findings seen in clonal lymphoid neoplasm.  Final results were signed out on 05/30/2020-due to the delay of  FISH results.  B sed on the FISH results, lymphoma is best classified as high-grade B-cell lymphoma not otherwise specified.  It does not meet morphological criteria for diffuse large B-cell lymphoma nor molecular criteria for BRCA lymphoma.  11 q. alteration testing has been sent.  Results are pending. 04/26/2020, MUGA testing showed normal  LVEF of 56% with normal LV wall motion.  05/13/2020, echocardiogram showed low normal end ejection fraction of 50 to  55%.  #04/19/2020 Mediport placed by Dr. Lucky Wallace  INTERVAL HISTORY Justin Wallace is a 74 y.o. male who has above history reviewed by me today presents for follow up visit for high-grade B-cell lymphoma Problems and complaints are listed below: Patient finished cycle 1R-EPOCH treatment.   Patient tolerates well with mild difficulties.  He reports being tired for a few days after the treatment. Status post cycle 1 intrathecal methotrexate prophylaxis treatment.  Tolerates well.  Denies headache, nausea vomiting. Denies any fever, chills, shortness of breath, cough.  Patient is accompanied by his wife today. His appetite is good.   Review of Systems  Constitutional: Positive for fatigue. Negative for appetite change, chills, fever and unexpected weight change.  HENT:   Negative for hearing loss and voice change.   Eyes: Negative for eye problems and icterus.  Respiratory: Negative for chest tightness, cough and shortness of breath.   Cardiovascular: Negative for chest pain and leg swelling.  Gastrointestinal: Negative for abdominal distention and abdominal pain.  Endocrine: Negative for hot flashes.  Genitourinary: Negative for difficulty urinating, dysuria and frequency.   Musculoskeletal: Negative for arthralgias.  Skin: Negative for itching and rash.  Neurological: Negative for light-headedness and numbness.  Hematological: Negative for adenopathy. Does not bruise/bleed easily.  Psychiatric/Behavioral: Negative for confusion.    MEDICAL HISTORY:  Past Medical History:  Diagnosis Date  . Celiac disease   . 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[place]) 05/27/2020  . HIV (human immunodeficiency virus infection) ([place])   . Lyme disease   . Lymphoma ([place])     SURGICAL HISTORY: Past Surgical History:  Procedure Laterality Date  . PORTA CATH INSERTION N/A 04/25/2020   Procedure: PORTA CATH INSERTION;  Surgeon: Algernon Huxley, MD;  Location: [facility];  Service: Cardiovascular;  Laterality: N/A;     SOCIAL HISTORY: Social History   Socioeconomic History  . Marital status: Married    Spouse name: Justin Wallace   . Number of children: 2  . Years of education: Not on file  . Highest education level: Not on file  Occupational History  . Not on file  Tobacco Use  . Smoking status: Never Smoker  . Smokeless tobacco: Never Used  Vaping Use  . Vaping Use: Never used  Substance and Sexual Activity  . Alcohol use: Yes    Comment: occasional  . Drug use: Never  . Sexual activity: Never  Other Topics Concern  . Not on file  Social History Narrative  . Not on file   Social Determinants of Health   Financial Resource Strain:   . Difficulty of Paying Living Expenses: Not on file  Food Insecurity:   . Worried About Charity fundraiser in the Last Year: Not on file  . Ran Out of Food in the Last Year: Not on file  Transportation Needs:   . Lack of Transportation (Medical): Not on file  . Lack of Transportation (Non-Medical): Not on file  Physical Activity:   . Days of Exercise per Week: Not on file  . Minutes of Exercise per Session: Not on file  Stress:   . Feeling of Stress : Not on file  Social Connections:   . Frequency of Communication with Friends and Family: Not on file  . Frequency of Social Gatherings with Friends and Family: Not on file  . Attends Religious Services: Not on file  .  Active Member of Clubs or Organizations: Not on file  . Attends Archivist Meetings: Not on file  . Marital Status: Not on file  Intimate Partner Violence:   . Fear of Current or Ex-Partner: Not on file  . Emotionally Abused: Not on file  . Physically Abused: Not on file  . Sexually Abused: Not on file    FAMILY HISTORY: Family History  Problem Relation Age of Onset  . Hyperlipidemia Mother   . Brain cancer Father     ALLERGIES:  has No Known Allergies.  MEDICATIONS:  Current Outpatient Medications  Medication Sig Dispense Refill  . acyclovir (ZOVIRAX) 400 MG tablet  Take 1 tablet (400 mg total) by mouth 2 (two) times daily. 60 tablet 5  . allopurinol (ZYLOPRIM) 300 MG tablet Take 1 tablet (300 mg total) by mouth 2 (two) times daily. 60 tablet 0  . bictegravir-emtricitabine-tenofovir AF (BIKTARVY) 50-200-25 MG TABS tablet Take 1 tablet by mouth daily. 30 tablet 5  . lidocaine-prilocaine (EMLA) cream Apply to affected area once 30 g 3  . magic mouthwash w/lidocaine SOLN Take 5 mLs by mouth 4 (four) times daily as needed for mouth pain. Sig: Swish/spit 5-10 ml four times a day as needed 480 mL 1  . ondansetron (ZOFRAN) 8 MG tablet Take 1 tablet (8 mg total) by mouth 2 (two) times daily as needed for refractory nausea / vomiting. Start on the third day after chemotherapy. 30 tablet 1  . prochlorperazine (COMPAZINE) 10 MG tablet Take 1 tablet (10 mg total) by mouth every 6 (six) hours as needed (Nausea or vomiting). 30 tablet 1  . sulfamethoxazole-trimethoprim (BACTRIM DS) 800-160 MG tablet Take 1 tablet by mouth daily. 30 tablet 2   No current facility-administered medications for this visit.   Facility-Administered Medications Ordered in Other Visits  Medication Dose Route Frequency Provider Last Rate Last Admin  . methotrexate (PF) 12 mg in sodium chloride (PF) 0.9 % INTRATHECAL chemo injection   Intrathecal Once Justin Server, MD         PHYSICAL EXAMINATION: ECOG PERFORMANCE STATUS: 1 - Symptomatic but completely ambulatory Vitals:   06/27/20 0846  BP: 110/69  Pulse: 76  Temp: (!) 97.5 F (36.4 C)  SpO2: 99%   Filed Weights   06/27/20 0846  Weight: 166 lb 6.4 oz (75.5 kg)    Physical Exam Constitutional:      General: He is not in acute distress. HENT:     Head: Normocephalic and atraumatic.  Eyes:     General: No scleral icterus. Cardiovascular:     Rate and Rhythm: Normal rate and regular rhythm.     Heart sounds: Normal heart sounds.  Pulmonary:     Effort: Pulmonary effort is normal. No respiratory distress.     Breath sounds: No  wheezing.  Abdominal:     General: Bowel sounds are normal. There is no distension.     Palpations: Abdomen is soft.  Musculoskeletal:        General: No deformity. Normal range of motion.     Cervical back: Normal range of motion and neck supple.     Comments: Left axillary mass is no longer palpable  Skin:    General: Skin is warm and dry.     Findings: No erythema or rash.  Neurological:     Mental Status: He is alert and oriented to person, place, and time. Mental status is at baseline.     Cranial Nerves: No cranial nerve deficit.  Coordination: Coordination normal.  Psychiatric:        Mood and Affect: Mood normal.     LABORATORY DATA:  I have reviewed the data as listed Lab Results  Component Value Date   WBC 7.1 06/14/2020   HGB 12.7 (L) 06/14/2020   HCT 37.2 (L) 06/14/2020   MCV 87.5 06/14/2020   PLT 131 (L) 06/14/2020   Recent Labs    03/10/20 1113 03/10/20 1113 03/17/20 1029 05/05/20 0956 05/05/20 0956 06/06/20 0822 06/09/20 1409 06/13/20 1025  NA 138   < > 137 139   < > 137 137 135  K 4.8   < > 3.8 4.4   < > 4.1 4.0 3.8  CL 106   < > 104 107   < > 108 107 105  CO2 27   < > 27 27   < > 24 24 24   GLUCOSE 94   < > 97 97   < > 97 110* 92  BUN 23   < > 21 19   < > 29* 28* 33*  CREATININE 0.91   < > 1.09 0.88  --  0.96 0.94 1.08  CALCIUM 8.4*   < > 8.1* 9.1   < > 9.0 9.0 8.0*  GFRNONAA 84   < > >60 86  --  >60 >60 >60  GFRAA 98  --  >60 100  --   --   --   --   PROT 6.8   < > 8.0 7.4  --  7.9  --  6.0*  ALBUMIN  --   --  2.7*  --   --  3.5  --  2.8*  AST 20   < > 24 15  --  21  --  18  ALT 13   < > 18 8*  --  14  --  18  ALKPHOS  --   --  140*  --   --  82  --  76  BILITOT 0.4   < > 0.3 0.5  --  0.5  --  0.5   < > = values in this interval not displayed.   Iron/TIBC/Ferritin/ %Sat    Component Value Date/Time   IRON 22 (L) 03/17/2020 1029   IRON 17 02/17/2018 0000   TIBC 286 03/17/2020 1029   TIBC 425 02/17/2018 0000   FERRITIN 284 03/17/2020  1029   IRONPCTSAT 8 (L) 03/17/2020 1029   IRONPCTSAT 8 (L) 03/10/2020 1113      RADIOGRAPHIC STUDIES: I have personally reviewed the radiological images as listed and agreed with the findings in the report. NM Myocar Multi W/Spect W/Wall Motion / EF  Result Date: 06/15/2020  There was no ST segment deviation noted during stress.  The study is normal.  This is a low risk study.  The left ventricular ejection fraction is low normal (51%).  There is no evidence for ischemia    DG FLUORO GUIDED LOC OF NEEDLE/CATH TIP FOR SPINAL INJECT LT  Result Date: 06/14/2020 CLINICAL DATA:  High-grade B-cell lymphoma. Patient in for intrathecal methotrexate injection. EXAM: FLUOROSCOPICALLY GUIDED LUMBAR PUNCTURE FOR INTRATHECAL CHEMOTHERAPY FLUOROSCOPY TIME:  1 minutes 42 seconds fluoroscopy time. 35.3 mGy radiation dose. Four images obtained. PROCEDURE: Informed consent was obtained from the patient prior to the procedure, including potential complications of headache, allergy, pain, and CSF leak. With the patient prone, the lower back was prepped with Betadine. 1% Lidocaine was used for local anesthesia. Lumbar puncture was performed at the  L3-L4 level using a gauge needle with return of clearCSF. 6 cc of CSF obtained for laboratory studies. Due to slow CSF flow 2 cc of Omnipaque 300 administered into the spinal canal to ensure intrathecal location. 12 mg of methotrexate was injected into the subarachnoid space. The patient tolerated the procedure well without apparent complication. IMPRESSION: Successful lumbar puncture for intrathecal chemotherapy. No complications. Electronically Signed   By: Marcello Moores  Register   On: 06/14/2020 13:05   CT CHEST W CONTRAST  Result Date: 03/30/2020 CLINICAL DATA:  Left axillary mass detected by ultrasound, fatigue, weight loss EXAM: CT CHEST WITH CONTRAST TECHNIQUE: Multidetector CT imaging of the chest was performed during intravenous contrast administration. CONTRAST:   33m OMNIPAQUE IOHEXOL 300 MG/ML  SOLN COMPARISON:  None. FINDINGS: Cardiovascular: Aortic atherosclerosis. Normal heart size. Left coronary artery calcifications. No pericardial effusion. Mediastinum/Nodes: There is a soft tissue mass or grossly enlarged lymph node in the left axilla measuring 6.3 x 4.6 x 3.0 cm (series 2, image 47, series 6, image 155). There are additional prominent, although much smaller right axillary lymph nodes, pretracheal, and superior mediastinal lymph nodes, measuring up to 1.0 x 0.8 cm in the superior mediastinum (series 2, image 42). No other enlarged mediastinal, hilar, or axillary lymph nodes. Thyroid gland, trachea, and esophagus demonstrate no significant findings. Lungs/Pleura: Mild dependent bibasilar scarring and or atelectasis. No pleural effusion or pneumothorax. Upper Abdomen: No acute abnormality. Musculoskeletal: No chest wall mass or suspicious bone lesions identified. IMPRESSION: 1. There is a soft tissue mass or grossly enlarged lymph node in the left axilla measuring 6.3 cm, concerning for primary mass or metastatic lymphadenopathy. 2. There are additional prominent, although much smaller right axillary lymph nodes, pretracheal, and superior mediastinal lymph nodes. Findings are concerning for malignancy such as lymphoma or nodal metastatic disease. Whole-body PET-CT may be helpful to assess for abnormal metabolic activity and additional abnormal lymph nodes or other evidence of metastatic disease in the abdomen and pelvis. 3. No evidence of pulmonary metastatic disease. 4. Coronary artery disease.  Aortic Atherosclerosis (ICD10-I70.0). Electronically Signed   By: AEddie CandleM.D.   On: 03/30/2020 11:46   NM Cardiac Muga Rest  Result Date: 04/27/2020 CLINICAL DATA:  High-grade B-cell lymphoma, pre chemotherapy assessment EXAM: NUCLEAR MEDICINE CARDIAC BLOOD POOL IMAGING (MUGA) TECHNIQUE: Cardiac multi-gated acquisition was performed at rest following intravenous  injection of Tc-913mabeled red blood cells. RADIOPHARMACEUTICALS:  22.01 mCi Tc-9910mrtechnetate in-vitro labeled red blood cells IV COMPARISON:  None FINDINGS: Calculated LEFT ventricular ejection fraction is 56%, normal. Study was obtained at a cardiac rate of 62 bpm. Patient was rhythmic during imaging. Cine analysis of the LEFT ventricle in 3 projections demonstrates normal LV wall motion. IMPRESSION: Normal LEFT ventricular ejection fraction of 56% with normal LV wall motion. Electronically Signed   By: MarLavonia DanaD.   On: 04/27/2020 11:42   PERIPHERAL VASCULAR CATHETERIZATION  Result Date: 04/25/2020 See op note  NM Myocar Multi W/Spect W/Wall Motion / EF  Result Date: 06/15/2020  There was no ST segment deviation noted during stress.  The study is normal.  This is a low risk study.  The left ventricular ejection fraction is low normal (51%).  There is no evidence for ischemia    NM PET Image Initial (PI) Whole Body  Result Date: 04/12/2020 CLINICAL DATA:  Initial treatment strategy for LEFT axillary mass. EXAM: NUCLEAR MEDICINE PET WHOLE BODY TECHNIQUE: 7.8 mCi F-18 FDG was injected intravenously. Full-ring PET imaging was performed from  the head to foot after the radiotracer. CT data was obtained and used for attenuation correction and anatomic localization. Fasting blood glucose: 85 mg/dl COMPARISON:  None. FINDINGS: Mediastinal blood pool activity: SUV max 2.3 HEAD/NECK: No hypermetabolic activity in the scalp. No hypermetabolic cervical lymph nodes. Incidental CT findings: none CHEST: Large LEFT axillary mass measures 2.7 by 5.0 cm short axis with intense metabolic activity (SUV max equal 28) Several smaller LEFT axillary nodes more superiorly are not pathologic by size criteria and have minimal metabolic activity (SUV max 2.0) Similar small RIGHT axillary lymph nodes are normal morphology with low metabolic activity (SUV max equal 2.7 (image 472.) Incidental CT findings: No  suspicious pulmonary nodules. ABDOMEN/PELVIS: There is moderate activity in the porta hepatis region with SUV max equal 4.7. No discrete enlarged nodes are present. No abnormal activity liver. The spleen is normal size and normal metabolic activity. No periaortic or iliac adenopathy. Pelvic adenopathy or inguinal adenopathy. Incidental CT findings: none SKELETON: No focal hypermetabolic activity to suggest skeletal metastasis. Incidental CT findings: No lytic lesion. EXTREMITIES: No abnormal hypermetabolic activity in the lower extremities. Incidental CT findings: No lytic or sclerotic lesion IMPRESSION: 1. Large intensely hypermetabolic LEFT axillary mass with differential including solitary massive lymph node versus soft tissue plasmacytoma. 2. Additional small bilateral axial lymph nodes with mild metabolic activity. Favor reactive nodes. 3. Metabolic activity through the porta hepatis region without enlarged nodes identified. Favor reactive activity or benign GI activity. 4. Normal spleen and bone marrow. 5. No lytic or hypermetabolic skeletal lesion. Electronically Signed   By: Suzy Bouchard M.D.   On: 04/12/2020 14:40   CT BONE MARROW BIOPSY & ASPIRATION  Result Date: 05/02/2020 INDICATION: 74 year old male with recent diagnosis of high-grade lymphoma. He presents for CT-guided bone marrow aspiration and core biopsy for staging. EXAM: CT GUIDED BONE MARROW ASPIRATION AND CORE BIOPSY Interventional Radiologist:  Criselda Peaches, MD MEDICATIONS: None. ANESTHESIA/SEDATION: Moderate (conscious) sedation was employed during this procedure. A total of 2 milligrams versed and 100 micrograms fentanyl were administered intravenously. The patient's level of consciousness and vital signs were monitored continuously by radiology nursing throughout the procedure under my direct supervision. Total monitored sedation time: 8 minutes FLUOROSCOPY TIME:  None COMPLICATIONS: None immediate. Estimated blood loss: <25  mL PROCEDURE: Informed written consent was obtained from the patient after a thorough discussion of the procedural risks, benefits and alternatives. All questions were addressed. Maximal Sterile Barrier Technique was utilized including caps, mask, sterile gowns, sterile gloves, sterile drape, hand hygiene and skin antiseptic. A timeout was performed prior to the initiation of the procedure. The patient was positioned prone and non-contrast localization CT was performed of the pelvis to demonstrate the iliac marrow spaces. Maximal barrier sterile technique utilized including caps, mask, sterile gowns, sterile gloves, large sterile drape, hand hygiene, and betadine prep. Under sterile conditions and local anesthesia, an 11 gauge coaxial bone biopsy needle was advanced into the right iliac marrow space. Needle position was confirmed with CT imaging. Initially, bone marrow aspiration was performed. Next, the 11 gauge outer cannula was utilized to obtain a right iliac bone marrow core biopsy. Needle was removed. Hemostasis was obtained with compression. The patient tolerated the procedure well. Samples were prepared with the cytotechnologist. IMPRESSION: Technically successful CT-guided iliac bone marrow aspiration and core biopsy. Electronically Signed   By: Jacqulynn Cadet M.D.   On: 05/02/2020 10:06   [place] CORE BIOPSY (LYMPH NODES)  Result Date: 04/13/2020 INDICATION: No known primary, now with hypermetabolic  left axillary nodal mass. Please perform ultrasound-guided biopsy for tissue diagnostic purposes. EXAM: ULTRASOUND-GUIDED LEFT AXILLARY LYMPH NODE BIOPSY COMPARISON:  PET-CT-04/12/2020; chest CT-03/30/2020 MEDICATIONS: None ANESTHESIA/SEDATION: None COMPLICATIONS: None immediate. TECHNIQUE: Informed written consent was obtained from the patient after a discussion of the risks, benefits and alternatives to treatment. Questions regarding the procedure were encouraged and answered. Initial ultrasound scanning  demonstrated a large, at least 6.7 x 2.7 cm left axillary nodal conglomeration compatible with the findings seen on preceding PET-CT. An ultrasound image was saved for documentation purposes. The procedure was planned. A timeout was performed prior to the initiation of the procedure. The operative was prepped and draped in the usual sterile fashion, and a sterile drape was applied covering the operative field. A timeout was performed prior to the initiation of the procedure. Local anesthesia was provided with 1% lidocaine with epinephrine. Under direct ultrasound guidance, an 18 gauge core needle device was utilized to obtain to obtain 7 core needle biopsies of the hypermetabolic left axillary lymph node. The samples were placed in saline and submitted to pathology. The needle was removed and hemostasis was achieved with manual compression. Post procedure scan was negative for significant hematoma. A dressing was placed. The patient tolerated the procedure well without immediate postprocedural complication. IMPRESSION: Technically successful ultrasound guided biopsy of dominant hypermetabolic left axillary lymph node. Electronically Signed   By: Sandi Mariscal M.D.   On: 04/13/2020 15:49   ECHOCARDIOGRAM LIMITED  Result Date: 05/13/2020    ECHOCARDIOGRAM LIMITED REPORT   Patient Name:   TYELER Pankowski  Date of Exam: 05/13/2020 Medical Rec #:  [id number]  Height:       69.0 in Accession #:    [id number] Weight:       147.0 lb Date of Birth:  Jun 09, 1946  BSA:          1.813 m Patient Age:    15 years   BP:           93/63 mmHg Patient Gender: M          HR:           60 bpm. Exam Location:  [facility] Procedure: 2D Echo, Cardiac Doppler and Color Doppler Indications:     Chemotherapy evaluation V 87.41  History:         Patient has no prior history of Echocardiogram examinations. No                  heart history listed in chart.  Sonographer:     Sherrie Sport RDCS (AE) Referring Phys:  [id number] Delayza Lungren Diagnosing Phys: Bartholome Bill MD IMPRESSIONS  1. Left ventricular ejection fraction, by estimation, is 50 to 55%. The left ventricle has low normal function. Left ventricular diastolic parameters were normal.  2. Right ventricular systolic function is normal. The right ventricular size is mildly enlarged.  3. Right atrial size was mildly dilated.  4. The mitral valve is grossly normal. Mild mitral valve regurgitation.  5. The aortic valve is tricuspid. Aortic valve regurgitation is mild. FINDINGS  Left Ventricle: Left ventricular ejection fraction, by estimation, is 50 to 55%. The left ventricle has low normal function. The left ventricular internal cavity size was normal in size. There is no left ventricular hypertrophy. Left ventricular diastolic parameters were normal. Right Ventricle: The right ventricular size is mildly enlarged. No increase in right ventricular wall thickness. Right ventricular systolic function is normal. Left Atrium: Left atrial size was normal in size. Right Atrium:  Right atrial size was mildly dilated. Pericardium: There is no evidence of pericardial effusion. Mitral Valve: The mitral valve is grossly normal. Mild mitral valve regurgitation. Tricuspid Valve: The tricuspid valve is grossly normal. Tricuspid valve regurgitation is mild. Aortic Valve: The aortic valve is tricuspid. Aortic valve regurgitation is mild. Aortic regurgitation PHT measures 499 msec. Aortic valve mean gradient measures 3.0 mmHg. Aortic valve peak gradient measures 5.1 mmHg. Aortic valve area, by VTI measures 2.56 cm. Pulmonic Valve: The pulmonic valve was not well visualized. Pulmonic valve regurgitation is mild. Aorta: The aortic root is normal in size and structure. IAS/Shunts: The interatrial septum was not assessed. LEFT VENTRICLE PLAX 2D LVIDd:         5.08 cm  Diastology LVIDs:         3.55 cm  LV e' medial:    7.07 cm/s LV PW:         1.02 cm  LV E/e' medial:  8.7 LV IVS:        0.77 cm  LV e' lateral:   4.03 cm/s LVOT diam:      2.10 cm  LV E/e' lateral: 15.2 LV SV:         56 LV SV Index:   31 LVOT Area:     3.46 cm  RIGHT VENTRICLE RV Basal diam:  3.86 cm RV S prime:     12.70 cm/s TAPSE (M-mode): 3.5 cm LEFT ATRIUM             Index       RIGHT ATRIUM           Index LA diam:        3.30 cm 1.82 cm/m  RA Area:     19.00 cm LA Vol (A2C):   52.2 ml 28.80 ml/m RA Volume:   54.60 ml  30.12 ml/m LA Vol (A4C):   22.9 ml 12.63 ml/m LA Biplane Vol: 36.4 ml 20.08 ml/m  AORTIC VALVE                   PULMONIC VALVE AV Area (Vmax):    2.03 cm    PV Vmax:        0.69 m/s AV Area (Vmean):   2.01 cm    PV Peak grad:   1.9 mmHg AV Area (VTI):     2.56 cm    RVOT Peak grad: 2 mmHg AV Vmax:           112.50 cm/s AV Vmean:          78.500 cm/s AV VTI:            0.218 m AV Peak Grad:      5.1 mmHg AV Mean Grad:      3.0 mmHg LVOT Vmax:         65.90 cm/s LVOT Vmean:        45.500 cm/s LVOT VTI:          0.161 m LVOT/AV VTI ratio: 0.74 AI PHT:            499 msec  AORTA Ao Root diam: 3.00 cm MITRAL VALVE               TRICUSPID VALVE MV Area (PHT): 3.93 cm    TR Peak grad:   28.9 mmHg MV Decel Time: 193 msec    TR Vmax:        269.00 cm/s MV E velocity: 61.30 cm/s MV A velocity: 66.80 cm/s  SHUNTS MV E/A ratio:  0.92        Systemic VTI:  0.16 m                            Systemic Diam: 2.10 cm Bartholome Bill MD Electronically signed by Bartholome Bill MD Signature Date/Time: 05/13/2020/12:58:21 PM    Final    DG FLUORO GUIDED LOC OF NEEDLE/CATH TIP FOR SPINAL INJECT LT  Result Date: 06/14/2020 CLINICAL DATA:  High-grade B-cell lymphoma. Patient in for intrathecal methotrexate injection. EXAM: FLUOROSCOPICALLY GUIDED LUMBAR PUNCTURE FOR INTRATHECAL CHEMOTHERAPY FLUOROSCOPY TIME:  1 minutes 42 seconds fluoroscopy time. 35.3 mGy radiation dose. Four images obtained. PROCEDURE: Informed consent was obtained from the patient prior to the procedure, including potential complications of headache, allergy, pain, and CSF leak. With the patient prone,  the lower back was prepped with Betadine. 1% Lidocaine was used for local anesthesia. Lumbar puncture was performed at the L3-L4 level using a gauge needle with return of clearCSF. 6 cc of CSF obtained for laboratory studies. Due to slow CSF flow 2 cc of Omnipaque 300 administered into the spinal canal to ensure intrathecal location. 12 mg of methotrexate was injected into the subarachnoid space. The patient tolerated the procedure well without apparent complication. IMPRESSION: Successful lumbar puncture for intrathecal chemotherapy. No complications. Electronically Signed   By: Marcello Moores  Register   On: 06/14/2020 13:05      ASSESSMENT & PLAN:  1. 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 AIDS (acquired immune deficiency syndrome) ([place])   3. Anemia due to antineoplastic chemotherapy   4. 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 Left axillary mass final pathology is high-grade B-cell lymphoma, nonspecific type.   CD 20+, MYC+, BCL2-, BCL-6+, Cyclin D1-, Ki-67 >95% Status post cycle 1 dose adjusted EPOCH with rituximab. Lab has been monitored closely. Nadir ANC >0.5, stable uric acid, phosphorus and potassium, kidney function.  No clinical signs of tumor lysis syndrome. Labs reviewed and discussed with patient. Recommend to proceed with cycle 3 dose adjusted  EPOCH +1 dose increased with rituximab. Patient will receive Onpro for G-CSF support. Repeat CBC on 12/6, 12/10, 12/17  #Continue CNS prophylactic therapy with intrathecal methotrexate Patient is scheduled on day 9 of treatment. Prophylaxis -Continue Bactrim daily. -Continue acyclovir 400 mg twice daily -Continue allopurinol 300 mg twice daily - Magic mouth wash for mucositis prophylaxis In the context of COVID-19 pandemic, we discussed about hand hygiene, precautions, social distancing etc.  # AIDS patient follows with ID physician.  Has been on  antiretroviral therapy with Biktarvy #Patient and wife have questions and I answered to their satisfaction.  Supportive care measures are necessary for patient well-being and will be provided as necessary. We spent sufficient time to discuss many aspect of care, questions were answered to patient and wife's satisfaction.    Orders Placed This Encounter  Procedures  . Uric acid    Standing Status:   Future    Standing Expiration Date:   06/27/2021  . Phosphorus    Standing Status:   Future    Standing Expiration Date:   06/27/2021  . CBC with Differential/Platelet    Standing Status:   Future    Standing Expiration Date:   06/27/2021  . Comprehensive metabolic panel    Standing Status:   Future    Standing Expiration Date:   06/27/2021  . Uric acid  Standing Status:   Future    Standing Expiration Date:   06/27/2021  . Phosphorus    Standing Status:   Future    Standing Expiration Date:   06/27/2021    All questions were answered. The patient knows to call the clinic with any problems questions or concerns.  cc Justin Wallace *  Follow-up in 2 weeks for evaluation of tolerability and 3 weeks for next cycle of treatmewnt.   Justin Server, MD, PhD Hematology Oncology [facility] Pager- [phone] 06/27/2020

## 2020-06-27 NOTE — Addendum Note (Signed)
Addended by: Earlie Server on: 06/27/2020 09:02 PM   Modules accepted: Orders

## 2020-06-28 ENCOUNTER — Inpatient Hospital Stay: Payer: Medicare HMO

## 2020-06-28 VITALS — BP 115/67 | HR 75 | Temp 97.3°F

## 2020-06-28 DIAGNOSIS — Z5111 Encounter for antineoplastic chemotherapy: Secondary | ICD-10-CM | POA: Diagnosis not present

## 2020-06-28 DIAGNOSIS — D649 Anemia, unspecified: Secondary | ICD-10-CM | POA: Diagnosis not present

## 2020-06-28 DIAGNOSIS — B20 Human immunodeficiency virus [HIV] disease: Secondary | ICD-10-CM | POA: Diagnosis not present

## 2020-06-28 DIAGNOSIS — C8514 Unspecified B-cell lymphoma, lymph nodes of axilla and upper limb: Secondary | ICD-10-CM | POA: Diagnosis not present

## 2020-06-28 DIAGNOSIS — C8512 Unspecified B-cell lymphoma, intrathoracic lymph nodes: Secondary | ICD-10-CM | POA: Diagnosis not present

## 2020-06-28 DIAGNOSIS — K900 Celiac disease: Secondary | ICD-10-CM | POA: Diagnosis not present

## 2020-06-28 DIAGNOSIS — Z5112 Encounter for antineoplastic immunotherapy: Secondary | ICD-10-CM | POA: Diagnosis not present

## 2020-06-28 DIAGNOSIS — C8510 Unspecified B-cell lymphoma, unspecified site: Secondary | ICD-10-CM

## 2020-06-28 DIAGNOSIS — Z5189 Encounter for other specified aftercare: Secondary | ICD-10-CM | POA: Diagnosis not present

## 2020-06-28 MED ORDER — VINCRISTINE SULFATE CHEMO INJECTION 1 MG/ML
Freq: Once | INTRAVENOUS | Status: DC
  Filled 2020-06-28: qty 11

## 2020-06-29 ENCOUNTER — Other Ambulatory Visit: Payer: Self-pay | Admitting: Oncology

## 2020-06-29 ENCOUNTER — Inpatient Hospital Stay: Payer: Medicare HMO | Attending: Oncology

## 2020-06-29 ENCOUNTER — Other Ambulatory Visit: Payer: Self-pay

## 2020-06-29 VITALS — BP 102/62 | HR 67 | Temp 97.2°F

## 2020-06-29 DIAGNOSIS — Z452 Encounter for adjustment and management of vascular access device: Secondary | ICD-10-CM | POA: Diagnosis not present

## 2020-06-29 DIAGNOSIS — D6481 Anemia due to antineoplastic chemotherapy: Secondary | ICD-10-CM | POA: Insufficient documentation

## 2020-06-29 DIAGNOSIS — Z5189 Encounter for other specified aftercare: Secondary | ICD-10-CM | POA: Diagnosis not present

## 2020-06-29 DIAGNOSIS — D709 Neutropenia, unspecified: Secondary | ICD-10-CM | POA: Insufficient documentation

## 2020-06-29 DIAGNOSIS — B20 Human immunodeficiency virus [HIV] disease: Secondary | ICD-10-CM | POA: Diagnosis not present

## 2020-06-29 DIAGNOSIS — C8512 Unspecified B-cell lymphoma, intrathoracic lymph nodes: Secondary | ICD-10-CM | POA: Diagnosis not present

## 2020-06-29 DIAGNOSIS — Z5111 Encounter for antineoplastic chemotherapy: Secondary | ICD-10-CM | POA: Insufficient documentation

## 2020-06-29 DIAGNOSIS — C8514 Unspecified B-cell lymphoma, lymph nodes of axilla and upper limb: Secondary | ICD-10-CM | POA: Insufficient documentation

## 2020-06-29 DIAGNOSIS — Z5112 Encounter for antineoplastic immunotherapy: Secondary | ICD-10-CM | POA: Insufficient documentation

## 2020-06-29 DIAGNOSIS — C8510 Unspecified B-cell lymphoma, unspecified site: Secondary | ICD-10-CM

## 2020-06-29 MED ORDER — PREDNISONE 50 MG PO TABS: ORAL_TABLET | ORAL | 0 refills | Status: DC

## 2020-06-29 MED ORDER — SODIUM CHLORIDE 0.9% FLUSH
10.0000 mL | INTRAVENOUS | Status: DC | PRN
  Administered 2020-06-29: 10 mL
  Filled 2020-06-29: qty 10

## 2020-06-29 MED ORDER — VINCRISTINE SULFATE CHEMO INJECTION 1 MG/ML
Freq: Once | INTRAVENOUS | Status: DC
  Filled 2020-06-29: qty 11

## 2020-06-29 MED ORDER — SODIUM CHLORIDE 0.9 % IV SOLN
Freq: Once | INTRAVENOUS | Status: AC
  Filled 2020-06-29: qty 250

## 2020-06-29 MED ORDER — PALONOSETRON HCL INJECTION 0.25 MG/5ML
0.2500 mg | Freq: Once | INTRAVENOUS | Status: AC
  Administered 2020-06-29: 0.25 mg via INTRAVENOUS
  Filled 2020-06-29: qty 5

## 2020-06-29 NOTE — Telephone Encounter (Signed)
12/28 injection encouter entered and  I have notified Christen Butter, pharmacist at [facility] with scheduled 12/28 intrathecal methotrexate in IR.  [facility] will call main pharmacy for pickup and delivery to IR once medicaiton is ready

## 2020-06-29 NOTE — Progress Notes (Signed)
Patient stable at discharge. Informed patient to pick up prescription for prednisone and to take two tablets today, tomorrow(12/2) and Friday(12/3).Patient verbalized understanding

## 2020-06-30 ENCOUNTER — Telehealth: Payer: Self-pay

## 2020-06-30 ENCOUNTER — Inpatient Hospital Stay: Payer: Medicare HMO

## 2020-06-30 ENCOUNTER — Other Ambulatory Visit: Payer: Self-pay

## 2020-06-30 VITALS — BP 110/61 | HR 77 | Temp 96.7°F | Resp 20

## 2020-06-30 DIAGNOSIS — B20 Human immunodeficiency virus [HIV] disease: Secondary | ICD-10-CM | POA: Diagnosis not present

## 2020-06-30 DIAGNOSIS — C8512 Unspecified B-cell lymphoma, intrathoracic lymph nodes: Secondary | ICD-10-CM | POA: Diagnosis not present

## 2020-06-30 DIAGNOSIS — C8514 Unspecified B-cell lymphoma, lymph nodes of axilla and upper limb: Secondary | ICD-10-CM | POA: Diagnosis not present

## 2020-06-30 DIAGNOSIS — D6481 Anemia due to antineoplastic chemotherapy: Secondary | ICD-10-CM | POA: Diagnosis not present

## 2020-06-30 DIAGNOSIS — C8510 Unspecified B-cell lymphoma, unspecified site: Secondary | ICD-10-CM

## 2020-06-30 DIAGNOSIS — Z5189 Encounter for other specified aftercare: Secondary | ICD-10-CM | POA: Diagnosis not present

## 2020-06-30 DIAGNOSIS — Z452 Encounter for adjustment and management of vascular access device: Secondary | ICD-10-CM | POA: Diagnosis not present

## 2020-06-30 DIAGNOSIS — Z5112 Encounter for antineoplastic immunotherapy: Secondary | ICD-10-CM | POA: Diagnosis not present

## 2020-06-30 DIAGNOSIS — D709 Neutropenia, unspecified: Secondary | ICD-10-CM | POA: Diagnosis not present

## 2020-06-30 DIAGNOSIS — Z5111 Encounter for antineoplastic chemotherapy: Secondary | ICD-10-CM | POA: Diagnosis not present

## 2020-06-30 MED ORDER — SODIUM CHLORIDE 0.9 % IV SOLN
Freq: Once | INTRAVENOUS | Status: AC
  Filled 2020-06-30: qty 250

## 2020-06-30 MED ORDER — VINCRISTINE SULFATE CHEMO INJECTION 1 MG/ML
Freq: Once | INTRAVENOUS | Status: DC
  Filled 2020-06-30: qty 11

## 2020-06-30 MED ORDER — SODIUM CHLORIDE 0.9% FLUSH
10.0000 mL | INTRAVENOUS | Status: DC | PRN
  Administered 2020-06-30: 10 mL
  Filled 2020-06-30: qty 10

## 2020-06-30 MED ORDER — SODIUM CHLORIDE 0.9 % IV SOLN
20.0000 mg | Freq: Once | INTRAVENOUS | Status: AC
  Administered 2020-06-30: 20 mg via INTRAVENOUS
  Filled 2020-06-30: qty 2

## 2020-06-30 NOTE — Progress Notes (Signed)
Patient reports he has not been able to pick up his Prednisone prescription from his pharmacy because he received a message from them yesterday saying it was delayed and reviewing insurance. Patient did not take Prednisone yesterday, 06/29/2020, and he has not taken Prednisone today, 06/30/2020. Patient reports he is trying to contact his insurance about this. MD, Dr. Tasia Catchings, notified and aware. MD ordering for patient to receive Dexamethasone 20 mg IV once in clinic today, see MAR. Duwayne Heck, CMA, contacted patient's pharmacy and resolved prescription delay issue. Patient informed to pick up Prednisone prescription from his pharmacy today and take it tomorrow, 07/01/2020, as prescribed. Patient educated and verbalized understanding.   1543- Patient stable and discharged to home with Adriamycin, Etoposide, Vincristine Continuous Infusion pump in place.

## 2020-06-30 NOTE — Addendum Note (Signed)
Addended by: [name] on: 06/30/2020 03:36 PM   Modules accepted: Orders

## 2020-06-30 NOTE — Telephone Encounter (Signed)
Pt scheduled for labs on 07/04/20 at [facility] infectious disease at 10 am and labs at [facility] at 145 pm. Contacted ID and spoke to [place] to see if they can draw CBC with diff. They will draw lab, per [facility]. Lab appt cancelled here and patient was notified. Also, went over appts with patient and notified of Methotrexate injection dates and that he does not need to come to cancer center, but [facility] day. Pt voiced understanding.

## 2020-07-01 ENCOUNTER — Inpatient Hospital Stay: Payer: Medicare HMO

## 2020-07-01 VITALS — BP 123/63 | HR 66 | Temp 97.1°F | Resp 18

## 2020-07-01 DIAGNOSIS — Z5112 Encounter for antineoplastic immunotherapy: Secondary | ICD-10-CM | POA: Diagnosis not present

## 2020-07-01 DIAGNOSIS — C8514 Unspecified B-cell lymphoma, lymph nodes of axilla and upper limb: Secondary | ICD-10-CM | POA: Diagnosis not present

## 2020-07-01 DIAGNOSIS — B20 Human immunodeficiency virus [HIV] disease: Secondary | ICD-10-CM | POA: Diagnosis not present

## 2020-07-01 DIAGNOSIS — Z5189 Encounter for other specified aftercare: Secondary | ICD-10-CM | POA: Diagnosis not present

## 2020-07-01 DIAGNOSIS — D709 Neutropenia, unspecified: Secondary | ICD-10-CM | POA: Diagnosis not present

## 2020-07-01 DIAGNOSIS — Z452 Encounter for adjustment and management of vascular access device: Secondary | ICD-10-CM | POA: Diagnosis not present

## 2020-07-01 DIAGNOSIS — Z5111 Encounter for antineoplastic chemotherapy: Secondary | ICD-10-CM | POA: Diagnosis not present

## 2020-07-01 DIAGNOSIS — C8510 Unspecified B-cell lymphoma, unspecified site: Secondary | ICD-10-CM

## 2020-07-01 DIAGNOSIS — D6481 Anemia due to antineoplastic chemotherapy: Secondary | ICD-10-CM | POA: Diagnosis not present

## 2020-07-01 MED ORDER — SODIUM CHLORIDE 0.9 % IV SOLN
900.0000 mg/m2 | Freq: Once | INTRAVENOUS | Status: AC
  Administered 2020-07-01: 1700 mg via INTRAVENOUS
  Filled 2020-07-01: qty 85

## 2020-07-01 MED ORDER — PALONOSETRON HCL INJECTION 0.25 MG/5ML
0.2500 mg | Freq: Once | INTRAVENOUS | Status: AC
  Administered 2020-07-01: 0.25 mg via INTRAVENOUS
  Filled 2020-07-01: qty 5

## 2020-07-01 MED ORDER — HEPARIN SOD (PORK) LOCK FLUSH 100 UNIT/ML IV SOLN
INTRAVENOUS | Status: AC
  Filled 2020-07-01: qty 5

## 2020-07-01 MED ORDER — HEPARIN SOD (PORK) LOCK FLUSH 100 UNIT/ML IV SOLN
500.0000 [IU] | Freq: Once | INTRAVENOUS | Status: AC | PRN
  Administered 2020-07-01: 500 [IU]
  Filled 2020-07-01: qty 5

## 2020-07-01 MED ORDER — PEGFILGRASTIM 6 MG/0.6ML ~~LOC~~ PSKT
6.0000 mg | PREFILLED_SYRINGE | Freq: Once | SUBCUTANEOUS | Status: AC
  Administered 2020-07-01: 6 mg via SUBCUTANEOUS
  Filled 2020-07-01: qty 0.6

## 2020-07-01 MED ORDER — SODIUM CHLORIDE 0.9 % IV SOLN
Freq: Once | INTRAVENOUS | Status: AC
  Filled 2020-07-01: qty 250

## 2020-07-01 NOTE — Progress Notes (Signed)
Pt here for C2D5.  Pt states that he picked up rx for prednisone and per MD direction will take today only. tx given as ordered. Completed without incident. Pt discharged stable.

## 2020-07-01 NOTE — Telephone Encounter (Signed)
Chemo dates have been changed so the intrathecal chemo in IR appt has been moved to 12/29 and inj encounter has been moved as well.

## 2020-07-04 ENCOUNTER — Other Ambulatory Visit: Payer: Medicare HMO

## 2020-07-04 ENCOUNTER — Other Ambulatory Visit: Payer: Self-pay

## 2020-07-04 ENCOUNTER — Inpatient Hospital Stay: Payer: Medicare HMO

## 2020-07-04 DIAGNOSIS — C8510 Unspecified B-cell lymphoma, unspecified site: Secondary | ICD-10-CM

## 2020-07-04 DIAGNOSIS — B20 Human immunodeficiency virus [HIV] disease: Secondary | ICD-10-CM | POA: Diagnosis not present

## 2020-07-05 ENCOUNTER — Other Ambulatory Visit: Payer: Self-pay | Admitting: *Deleted

## 2020-07-05 ENCOUNTER — Ambulatory Visit
Admission: RE | Admit: 2020-07-05 | Discharge: 2020-07-05 | Disposition: A | Discharge status: Home / Self Care | Payer: Medicare HMO | Source: Ambulatory Visit | Attending: Oncology | Admitting: Oncology

## 2020-07-05 ENCOUNTER — Telehealth: Payer: Self-pay

## 2020-07-05 ENCOUNTER — Inpatient Hospital Stay: Payer: Medicare HMO

## 2020-07-05 DIAGNOSIS — C8510 Unspecified B-cell lymphoma, unspecified site: Secondary | ICD-10-CM | POA: Diagnosis not present

## 2020-07-05 DIAGNOSIS — C8330 Diffuse large B-cell lymphoma, unspecified site: Secondary | ICD-10-CM | POA: Diagnosis not present

## 2020-07-05 LAB — PROTIME-INR
INR: 0.9 (ref 0.8–1.2)
Prothrombin Time: 12.2 seconds (ref 11.4–15.2)

## 2020-07-05 LAB — T-HELPER CELL (CD4) - (RCID CLINIC ONLY)
CD4 % Helper T Cell: 25 % — ABNORMAL LOW (ref 33–65)
CD4 T Cell Abs: 108 /uL — ABNORMAL LOW (ref 400–1790)

## 2020-07-05 LAB — APTT: aPTT: 28 seconds (ref 24–36)

## 2020-07-05 MED ORDER — LIDOCAINE HCL (PF) 1 % IJ SOLN
5.0000 mL | Freq: Once | INTRAMUSCULAR | Status: AC
  Administered 2020-07-05: 5 mL via INTRADERMAL
  Filled 2020-07-05: qty 5

## 2020-07-05 MED ORDER — SODIUM CHLORIDE (PF) 0.9 % IJ SOLN
Freq: Once | INTRAMUSCULAR | Status: DC
  Filled 2020-07-05: qty 0.48

## 2020-07-05 MED ORDER — IOHEXOL 180 MG/ML  SOLN
20.0000 mL | Freq: Once | INTRAMUSCULAR | Status: AC | PRN
  Administered 2020-07-05: 20 mL via INTRATHECAL

## 2020-07-05 NOTE — Telephone Encounter (Signed)
Received voicemail from [place] at [place] requesting callback regarding patient's CBC. Urgent results relayed were Abs Neutrophils of 40,082. Will route to provider.   Beryle Flock, RN

## 2020-07-05 NOTE — Telephone Encounter (Signed)
Thanks, Barista.  Yes, I also talked to his oncologist.  He received Neulasta 3 days prior to this blood draw which is why his WBC is so high.  Thanks, Mitzi Hansen

## 2020-07-05 NOTE — Progress Notes (Signed)
Pt stable after intrathecal MTX administration.Back stable.D/c instructions given.F/u with his MD.Thankyou.

## 2020-07-06 DIAGNOSIS — C8512 Unspecified B-cell lymphoma, intrathoracic lymph nodes: Secondary | ICD-10-CM | POA: Diagnosis not present

## 2020-07-06 LAB — CBC WITH DIFFERENTIAL/PLATELET
Absolute Monocytes: 123 cells/uL — ABNORMAL LOW (ref 200–950)
Basophils Absolute: 205 cells/uL — ABNORMAL HIGH (ref 0–200)
Basophils Relative: 0.5 %
Eosinophils Absolute: 0 cells/uL — ABNORMAL LOW (ref 15–500)
Eosinophils Relative: 0 %
HCT: 34.3 % — ABNORMAL LOW (ref 38.5–50.0)
Hemoglobin: 11.6 g/dL — ABNORMAL LOW (ref 13.2–17.1)
Lymphs Abs: 491 cells/uL — ABNORMAL LOW (ref 850–3900)
MCH: 30.5 pg (ref 27.0–33.0)
MCHC: 33.8 g/dL (ref 32.0–36.0)
MCV: 90.3 fL (ref 80.0–100.0)
MPV: 9.2 fL (ref 7.5–12.5)
Monocytes Relative: 0.3 %
Neutro Abs: 40082 cells/uL — ABNORMAL HIGH (ref 1500–7800)
Neutrophils Relative %: 98 %
Platelets: 547 10*3/uL — ABNORMAL HIGH (ref 140–400)
RBC: 3.8 10*6/uL — ABNORMAL LOW (ref 4.20–5.80)
RDW: 15.7 % — ABNORMAL HIGH (ref 11.0–15.0)
Total Lymphocyte: 1.2 %
WBC: 40.9 10*3/uL — ABNORMAL HIGH (ref 3.8–10.8)

## 2020-07-06 LAB — COMPREHENSIVE METABOLIC PANEL
AG Ratio: 1.5 (calc) (ref 1.0–2.5)
ALT: 13 U/L (ref 9–46)
AST: 13 U/L (ref 10–35)
Albumin: 3.5 g/dL — ABNORMAL LOW (ref 3.6–5.1)
Alkaline phosphatase (APISO): 132 U/L (ref 35–144)
BUN: 17 mg/dL (ref 7–25)
CO2: 30 mmol/L (ref 20–32)
Calcium: 9 mg/dL (ref 8.6–10.3)
Chloride: 105 mmol/L (ref 98–110)
Creat: 0.89 mg/dL (ref 0.70–1.18)
Globulin: 2.4 g/dL (calc) (ref 1.9–3.7)
Glucose, Bld: 80 mg/dL (ref 65–99)
Potassium: 4.3 mmol/L (ref 3.5–5.3)
Sodium: 141 mmol/L (ref 135–146)
Total Bilirubin: 0.4 mg/dL (ref 0.2–1.2)
Total Protein: 5.9 g/dL — ABNORMAL LOW (ref 6.1–8.1)

## 2020-07-06 LAB — HIV-1 RNA QUANT-NO REFLEX-BLD
HIV 1 RNA Quant: 137 Copies/mL — ABNORMAL HIGH
HIV-1 RNA Quant, Log: 2.14 Log cps/mL — ABNORMAL HIGH

## 2020-07-07 ENCOUNTER — Encounter: Payer: Self-pay | Admitting: Oncology

## 2020-07-07 LAB — SURGICAL PATHOLOGY

## 2020-07-08 ENCOUNTER — Inpatient Hospital Stay: Payer: Medicare HMO

## 2020-07-08 ENCOUNTER — Telehealth: Payer: Self-pay

## 2020-07-08 ENCOUNTER — Encounter: Payer: Self-pay | Admitting: Oncology

## 2020-07-08 ENCOUNTER — Other Ambulatory Visit: Payer: Self-pay

## 2020-07-08 ENCOUNTER — Inpatient Hospital Stay (HOSPITAL_BASED_OUTPATIENT_CLINIC_OR_DEPARTMENT_OTHER): Payer: Medicare HMO | Admitting: Oncology

## 2020-07-08 VITALS — BP 113/72 | HR 75 | Temp 97.0°F | Resp 16 | Wt 164.6 lb

## 2020-07-08 DIAGNOSIS — L989 Disorder of the skin and subcutaneous tissue, unspecified: Secondary | ICD-10-CM

## 2020-07-08 DIAGNOSIS — C8514 Unspecified B-cell lymphoma, lymph nodes of axilla and upper limb: Secondary | ICD-10-CM | POA: Diagnosis not present

## 2020-07-08 DIAGNOSIS — T451X5A Adverse effect of antineoplastic and immunosuppressive drugs, initial encounter: Secondary | ICD-10-CM

## 2020-07-08 DIAGNOSIS — B20 Human immunodeficiency virus [HIV] disease: Secondary | ICD-10-CM

## 2020-07-08 DIAGNOSIS — C8510 Unspecified B-cell lymphoma, unspecified site: Secondary | ICD-10-CM | POA: Diagnosis not present

## 2020-07-08 DIAGNOSIS — Z5111 Encounter for antineoplastic chemotherapy: Secondary | ICD-10-CM | POA: Diagnosis not present

## 2020-07-08 DIAGNOSIS — Z5189 Encounter for other specified aftercare: Secondary | ICD-10-CM | POA: Diagnosis not present

## 2020-07-08 DIAGNOSIS — D701 Agranulocytosis secondary to cancer chemotherapy: Secondary | ICD-10-CM

## 2020-07-08 DIAGNOSIS — D6481 Anemia due to antineoplastic chemotherapy: Secondary | ICD-10-CM | POA: Diagnosis not present

## 2020-07-08 DIAGNOSIS — Z452 Encounter for adjustment and management of vascular access device: Secondary | ICD-10-CM | POA: Diagnosis not present

## 2020-07-08 DIAGNOSIS — Z5112 Encounter for antineoplastic immunotherapy: Secondary | ICD-10-CM | POA: Diagnosis not present

## 2020-07-08 DIAGNOSIS — D709 Neutropenia, unspecified: Secondary | ICD-10-CM | POA: Diagnosis not present

## 2020-07-08 LAB — COMPREHENSIVE METABOLIC PANEL
ALT: 18 U/L (ref 0–44)
AST: 17 U/L (ref 15–41)
Albumin: 3.5 g/dL (ref 3.5–5.0)
Alkaline Phosphatase: 117 U/L (ref 38–126)
Anion gap: 9 (ref 5–15)
BUN: 20 mg/dL (ref 8–23)
CO2: 27 mmol/L (ref 22–32)
Calcium: 8.8 mg/dL — ABNORMAL LOW (ref 8.9–10.3)
Chloride: 100 mmol/L (ref 98–111)
Creatinine, Ser: 0.78 mg/dL (ref 0.61–1.24)
GFR, Estimated: >60 mL/min (ref >60)
Glucose, Bld: 115 mg/dL — ABNORMAL HIGH (ref 70–99)
Potassium: 4.3 mmol/L (ref 3.5–5.1)
Sodium: 136 mmol/L (ref 135–145)
Total Bilirubin: 0.7 mg/dL (ref 0.3–1.2)
Total Protein: 6.6 g/dL (ref 6.5–8.1)

## 2020-07-08 LAB — CBC WITH DIFFERENTIAL/PLATELET
Abs Immature Granulocytes: 0.19 10*3/uL — ABNORMAL HIGH (ref 0.00–0.07)
Basophils Absolute: 0 10*3/uL (ref 0.0–0.1)
Basophils Relative: 1 %
Eosinophils Absolute: 0 10*3/uL (ref 0.0–0.5)
Eosinophils Relative: 0 %
HCT: 33.2 % — ABNORMAL LOW (ref 39.0–52.0)
Hemoglobin: 11.2 g/dL — ABNORMAL LOW (ref 13.0–17.0)
Immature Granulocytes: 14 %
Lymphocytes Relative: 10 %
Lymphs Abs: 0.1 10*3/uL — ABNORMAL LOW (ref 0.7–4.0)
MCH: 30.7 pg (ref 26.0–34.0)
MCHC: 33.7 g/dL (ref 30.0–36.0)
MCV: 91 fL (ref 80.0–100.0)
Monocytes Absolute: 0 10*3/uL — ABNORMAL LOW (ref 0.1–1.0)
Monocytes Relative: 2 %
Neutro Abs: 1 10*3/uL — ABNORMAL LOW (ref 1.7–7.7)
Neutrophils Relative %: 73 %
Platelets: 257 10*3/uL (ref 150–400)
RBC: 3.65 MIL/uL — ABNORMAL LOW (ref 4.22–5.81)
RDW: 15.6 % — ABNORMAL HIGH (ref 11.5–15.5)
Smear Review: NORMAL
WBC: 1.4 10*3/uL — CL (ref 4.0–10.5)
nRBC: 0 % (ref 0.0–0.2)

## 2020-07-08 LAB — PHOSPHORUS: Phosphorus: 3.6 mg/dL (ref 2.5–4.6)

## 2020-07-08 LAB — URIC ACID: Uric Acid, Serum: 2.8 mg/dL — ABNORMAL LOW (ref 3.7–8.6)

## 2020-07-08 NOTE — Progress Notes (Signed)
Patient's appetite has been decreased for the past 2 days.  New numbness sensation in toes.

## 2020-07-08 NOTE — Telephone Encounter (Signed)
Per secure chat from Dr. Tasia Catchings: Please add cbc diff on 12/14. he knows the plan of doing extra cbc next week

## 2020-07-08 NOTE — Progress Notes (Signed)
Hematology/Oncology follow up note [facility] [phone] [phone]   Patient Care Team: Olin Hauser, DO as PCP - General (Family Medicine) Earlie Server, MD as Consulting Physician (Hematology and Oncology)  REFERRING PROVIDER: Nobie Putnam *  CHIEF COMPLAINTS/REASON FOR VISIT:  Follow up for axillary mass  HISTORY OF PRESENTING ILLNESS:   Justin Wallace is a  74 y.o.  male with PMH listed below was seen in consultation at the request of  Nobie Putnam *  for evaluation of symptomatic anemia Patient reported history of sudden onset of diffuse body aches, decreased appetite, headache low-grade fever, profound weakness/fatigue about 2 weeks ago.  Prior to the onset of symptoms, he went to a funeral as well as applicable clinic.  He reports that he has received COVID-19 vaccination previously.  He moved from [place].  Daughter is an Therapist, sports and told him to get COVID-19 PCR checked and he was tested negative. Patient also reports a sudden drop of weight since the onset of symptoms.  Patient has a history of celiac disease.  No colonoscopy records or pathology records in current EMR.  Diagnosis was done by [facility] gastroenterology.  Patient reports that he was in his usual state of health until the onset of symptoms.  03/10/2020, patient was evaluated by primary care provider.  Blood work showed mild anemia with hemoglobin of 13.1, Iron panel showed decreased saturation of 8, ferritin 218, TIBC 291, patient was referred to hematology for evaluation of symptomatic anemia and iron deficiency.  Patient reports that her body aches, fever and headache symptoms have improved.  However he continues to feel very tired and fatigued.  Appetite has decreased and weight remains low.  # Physical examination showed left axillary mass  ultrasound confirmed a left axillary large soft tissue mass up to 6.8 cm with internal vascularity. 03/30/2020  CT chest with contrast showed soft tissue mass/enlarged lymph node in the left axilla 6.3 cm, concerning for primary mass or metastatic lymphadenopathy.  There are additional prominent although much smaller right axillary lymph nodes, pretracheal and superior mediastinal lymph nodes.  Findings are concerning for malignancy such as lymphoma or nodal metastatic disease.  # treated for Lymes disease, finished doxycycline 176m BID for 21 days course. 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 he has started treatment with Biktarvy, also started on Bactrim daily.  04/26/2020 PET scan showed large intensely hypermetabolic left axillary mass SUV 28.  Additional small bilateral axilla lymph node with mild metabolic activity SUV 2.  Metabolic activity through the porta hepatis region without enlarged nodes identified.  No bone lesions. 05/02/2020 bone marrow biopsy showed hypercellular marrow for age with trilineage hematopoiesis.  Increased number of CD10 positive B cells, cannot rule out minimal or subtle involvement of marrow by the high-grade B-cell lymphoma. Normal cytogenetics. 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  Positive for BCL6 and MYC expression.  FISH testing showed negative for BCL-2/BCL6/MYC gene rearrangement.   Trisomy 3 and 18 were identified and are nonspecific findings seen in clonal lymphoid neoplasm.  Final results were signed out on 05/30/2020-due to the delay of  FISH results.  B sed on the FISH results, lymphoma is best classified as high-grade B-cell lymphoma not otherwise specified.  It does not meet morphological criteria for diffuse large B-cell lymphoma nor molecular criteria for BRCA lymphoma.  11 q. alteration testing has been sent.  Results are pending. 04/26/2020, MUGA testing showed normal  LVEF of 56% with normal LV wall motion.  05/13/2020, echocardiogram showed low normal end ejection fraction of 50 to  55%.  #04/19/2020 Mediport placed by Dr. Lucky Cowboy  INTERVAL HISTORY Justin Wallace is a 74 y.o. male who has above history reviewed by me today presents for follow up visit for high-grade B-cell lymphoma Problems and complaints are listed below: Patient finished cycle 2 dose adjusted R-EPOCH treatment.   He reports feeling slightly more tired.  Decreased appetite for the past 2 days. Otherwise no new complaints. Wife has concerns about some lesions on head and wanting to take a look at. There are a few erythematous scaly area on his scalp skin, wife has noticed these lesions for at least 3 to 4 months.   Review of Systems  Constitutional: Positive for fatigue. Negative for appetite change, chills, fever and unexpected weight change.  HENT:   Negative for hearing loss and voice change.   Eyes: Negative for eye problems and icterus.  Respiratory: Negative for chest tightness, cough and shortness of breath.   Cardiovascular: Negative for chest pain and leg swelling.  Gastrointestinal: Negative for abdominal distention and abdominal pain.  Endocrine: Negative for hot flashes.  Genitourinary: Negative for difficulty urinating, dysuria and frequency.   Musculoskeletal: Negative for arthralgias.  Skin: Negative for itching and rash.  Neurological: Negative for light-headedness and numbness.  Hematological: Negative for adenopathy. Does not bruise/bleed easily.  Psychiatric/Behavioral: Negative for confusion.    MEDICAL HISTORY:  Past Medical History:  Diagnosis Date  . Celiac disease   . 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[place]) 05/27/2020  . HIV (human immunodeficiency virus infection) ([place])   . Lyme disease   . Lymphoma ([place])     SURGICAL HISTORY: Past Surgical History:  Procedure Laterality Date  . PORTA CATH INSERTION N/A 04/25/2020   Procedure: PORTA CATH INSERTION;  Surgeon: Algernon Huxley, MD;  Location: [facility];  Service: Cardiovascular;  Laterality: N/A;    SOCIAL  HISTORY: Social History   Socioeconomic History  . Marital status: Married    Spouse name: Opal Sidles   . Number of children: 2  . Years of education: Not on file  . Highest education level: Not on file  Occupational History  . Not on file  Tobacco Use  . Smoking status: Never Smoker  . Smokeless tobacco: Never Used  Vaping Use  . Vaping Use: Never used  Substance and Sexual Activity  . Alcohol use: Yes    Comment: occasional  . Drug use: Never  . Sexual activity: Never  Other Topics Concern  . Not on file  Social History Narrative  . Not on file   Social Determinants of Health   Financial Resource Strain: Not on file  Food Insecurity: Not on file  Transportation Needs: Not on file  Physical Activity: Not on file  Stress: Not on file  Social Connections: Not on file  Intimate Partner Violence: Not on file    FAMILY HISTORY: Family History  Problem Relation Age of Onset  . Hyperlipidemia Mother   . Brain cancer Father     ALLERGIES:  has No Known Allergies.  MEDICATIONS:  Current Outpatient Medications  Medication Sig Dispense Refill  . acyclovir (ZOVIRAX) 400 MG tablet Take 1 tablet (400 mg total) by mouth 2 (two) times daily. 60 tablet 5  . allopurinol (ZYLOPRIM) 300 MG tablet Take 1 tablet (300 mg total) by mouth 2 (two) times daily. 60 tablet 0  . bictegravir-emtricitabine-tenofovir AF (BIKTARVY) 50-200-25 MG  TABS tablet Take 1 tablet by mouth daily. 30 tablet 5  . lidocaine-prilocaine (EMLA) cream Apply to affected area once 30 g 3  . magic mouthwash w/lidocaine SOLN Take 5 mLs by mouth 4 (four) times daily as needed for mouth pain. Sig: Swish/spit 5-10 ml four times a day as needed 480 mL 1  . ondansetron (ZOFRAN) 8 MG tablet Take 1 tablet (8 mg total) by mouth 2 (two) times daily as needed for refractory nausea / vomiting. Start on the third day after chemotherapy. 30 tablet 1  . polyethylene glycol powder (GLYCOLAX/MIRALAX) 17 GM/SCOOP powder Take by mouth as  needed.    . predniSONE (DELTASONE) 50 MG tablet Take 2 tablets on Day 3, 4, 5 of each chemotherapy treatments. 30 tablet 0  . prochlorperazine (COMPAZINE) 10 MG tablet Take 1 tablet (10 mg total) by mouth every 6 (six) hours as needed (Nausea or vomiting). 30 tablet 1  . sulfamethoxazole-trimethoprim (BACTRIM DS) 800-160 MG tablet Take 1 tablet by mouth daily. 30 tablet 2   No current facility-administered medications for this visit.   Facility-Administered Medications Ordered in Other Visits  Medication Dose Route Frequency Provider Last Rate Last Admin  . methotrexate (PF) 12 mg in sodium chloride (PF) 0.9 % INTRATHECAL chemo injection   Intrathecal Once Earlie Server, MD      . methotrexate (PF) 12 mg in sodium chloride (PF) 0.9 % INTRATHECAL chemo injection   Intrathecal Once Earlie Server, MD         PHYSICAL EXAMINATION: ECOG PERFORMANCE STATUS: 1 - Symptomatic but completely ambulatory Vitals:   07/08/20 0900  BP: 113/72  Pulse: 75  Resp: 16  Temp: (!) 97 F (36.1 C)  SpO2: 100%   Filed Weights   07/08/20 0900  Weight: 164 lb 9.6 oz (74.7 kg)    Physical Exam Constitutional:      General: He is not in acute distress. HENT:     Head: Normocephalic and atraumatic.  Eyes:     General: No scleral icterus. Cardiovascular:     Rate and Rhythm: Normal rate and regular rhythm.     Heart sounds: Normal heart sounds.  Pulmonary:     Effort: Pulmonary effort is normal. No respiratory distress.     Breath sounds: No wheezing.  Abdominal:     General: Bowel sounds are normal. There is no distension.     Palpations: Abdomen is soft.  Musculoskeletal:        General: No deformity. Normal range of motion.     Cervical back: Normal range of motion and neck supple.     Comments: Left axillary mass is no longer palpable  Skin:    General: Skin is warm and dry.     Findings: No erythema or rash.  Neurological:     Mental Status: He is alert and oriented to person, place, and time.  Mental status is at baseline.     Cranial Nerves: No cranial nerve deficit.     Coordination: Coordination normal.  Psychiatric:        Mood and Affect: Mood normal.       LABORATORY DATA:  I have reviewed the data as listed Lab Results  Component Value Date   WBC 1.4 (LL) 07/08/2020   HGB 11.2 (L) 07/08/2020   HCT 33.2 (L) 07/08/2020   MCV 91.0 07/08/2020   PLT 257 07/08/2020   Recent Labs    03/10/20 1113 03/17/20 1029 05/05/20 0956 06/06/20 0822 06/13/20 1025 06/27/20 0818 07/04/20  1001 07/08/20 0845  NA 138 137 139   < > 135 138 141 136  K 4.8 3.8 4.4   < > 3.8 4.1 4.3 4.3  CL 106 104 107   < > 105 105 105 100  CO2 _0 < > _1 GLUCOSE 94 97 97   < > 92 97 80 115*  BUN _2 < > 33* _3 CREATININE 0.91 1.09 0.88   < > 1.08 0.97 0.89 0.78  CALCIUM 8.4* 8.1* 9.1   < > 8.0* 8.6* 9.0 8.8*  GFRNONAA 84 >60 86   < > >60 >60  --  >60  GFRAA 98 >60 100  --   --   --   --   --   PROT 6.8 8.0 7.4   < > 6.0* 6.4* 5.9* 6.6  ALBUMIN  --  2.7*  --    < > 2.8* 3.1*  --  3.5  AST _4 < > _5 ALT 13 18 8*   < > _6 ALKPHOS  --  140*  --    < > 76 113  --  117  BILITOT 0.4 0.3 0.5   < > 0.5 0.2* 0.4 0.7   < > = values in this interval not displayed.   Iron/TIBC/Ferritin/ %Sat    Component Value Date/Time   IRON 22 (L) 03/17/2020 1029   IRON 17 02/17/2018 0000   TIBC 286 03/17/2020 1029   TIBC 425 02/17/2018 0000   FERRITIN 284 03/17/2020 1029   IRONPCTSAT 8 (L) 03/17/2020 1029   IRONPCTSAT 8 (L) 03/10/2020 1113      RADIOGRAPHIC STUDIES: I have personally reviewed the radiological images as listed and agreed with the findings in the report. NM Myocar Multi W/Spect W/Wall Motion / EF  Result Date: 06/15/2020  There was no ST segment deviation noted during stress.  The study is normal.  This is a low risk study.  The left ventricular ejection fraction is low normal (51%).  There is no evidence for ischemia     DG FLUORO GUIDED LOC OF NEEDLE/CATH TIP FOR SPINAL INJECT LT  Result Date: 07/05/2020 CLINICAL DATA:  High-grade B-cell lymphoma. EXAM: FLUOROSCOPICALLY GUIDED LUMBAR PUNCTURE FOR INTRATHECAL CHEMOTHERAPY FLUOROSCOPY TIME:  4 minutes 30 seconds fluoroscopy time. Radiation dose 69.4 mGy. PROCEDURE: Informed consent was obtained from the patient prior to the procedure, including potential complications of headache, allergy, and pain. With the patient prone, the lower back was prepped with Betadine. 1% Lidocaine was used for local anesthesia. Lumbar puncture was performed at the L3-L4 level using a gauge needle with return of clearCSF. Confirmation of intrathecal needle tip location was performed by administering 3 cc of Omnipaque 180. 12 mg of methotrexate supplied by pharmacy. 12 mg of methotrexate was injected into the subarachnoid space. The patient tolerated the procedure well without apparent complication. IMPRESSION: Successful intrathecal methotrexate administration. Electronically Signed   By: Marcello Moores  Register   On: 07/05/2020 11:03   DG FLUORO GUIDED LOC OF NEEDLE/CATH TIP FOR SPINAL INJECT LT  Result Date: 06/14/2020 CLINICAL DATA:  High-grade B-cell lymphoma. Patient in for intrathecal methotrexate injection. EXAM: FLUOROSCOPICALLY GUIDED LUMBAR PUNCTURE FOR INTRATHECAL CHEMOTHERAPY FLUOROSCOPY TIME:  1 minutes 42 seconds fluoroscopy time. 35.3 mGy radiation dose. Four images obtained. PROCEDURE: Informed consent was obtained from the patient prior to the procedure, including potential complications  of headache, allergy, pain, and CSF leak. With the patient prone, the lower back was prepped with Betadine. 1% Lidocaine was used for local anesthesia. Lumbar puncture was performed at the L3-L4 level using a gauge needle with return of clearCSF. 6 cc of CSF obtained for laboratory studies. Due to slow CSF flow 2 cc of Omnipaque 300 administered into the spinal canal to ensure intrathecal location. 12  mg of methotrexate was injected into the subarachnoid space. The patient tolerated the procedure well without apparent complication. IMPRESSION: Successful lumbar puncture for intrathecal chemotherapy. No complications. Electronically Signed   By: Marcello Moores  Register   On: 06/14/2020 13:05   NM Cardiac Muga Rest  Result Date: 04/27/2020 CLINICAL DATA:  High-grade B-cell lymphoma, pre chemotherapy assessment EXAM: NUCLEAR MEDICINE CARDIAC BLOOD POOL IMAGING (MUGA) TECHNIQUE: Cardiac multi-gated acquisition was performed at rest following intravenous injection of Tc-54mlabeled red blood cells. RADIOPHARMACEUTICALS:  22.01 mCi Tc-965mertechnetate in-vitro labeled red blood cells IV COMPARISON:  None FINDINGS: Calculated LEFT ventricular ejection fraction is 56%, normal. Study was obtained at a cardiac rate of 62 bpm. Patient was rhythmic during imaging. Cine analysis of the LEFT ventricle in 3 projections demonstrates normal LV wall motion. IMPRESSION: Normal LEFT ventricular ejection fraction of 56% with normal LV wall motion. Electronically Signed   By: MaLavonia Dana.D.   On: 04/27/2020 11:42   PERIPHERAL VASCULAR CATHETERIZATION  Result Date: 04/25/2020 See op note  NM Myocar Multi W/Spect W/Wall Motion / EF  Result Date: 06/15/2020  There was no ST segment deviation noted during stress.  The study is normal.  This is a low risk study.  The left ventricular ejection fraction is low normal (51%).  There is no evidence for ischemia    NM PET Image Initial (PI) Whole Body  Result Date: 04/12/2020 CLINICAL DATA:  Initial treatment strategy for LEFT axillary mass. EXAM: NUCLEAR MEDICINE PET WHOLE BODY TECHNIQUE: 7.8 mCi F-18 FDG was injected intravenously. Full-ring PET imaging was performed from the head to foot after the radiotracer. CT data was obtained and used for attenuation correction and anatomic localization. Fasting blood glucose: 85 mg/dl COMPARISON:  None. FINDINGS: Mediastinal blood pool  activity: SUV max 2.3 HEAD/NECK: No hypermetabolic activity in the scalp. No hypermetabolic cervical lymph nodes. Incidental CT findings: none CHEST: Large LEFT axillary mass measures 2.7 by 5.0 cm short axis with intense metabolic activity (SUV max equal 28) Several smaller LEFT axillary nodes more superiorly are not pathologic by size criteria and have minimal metabolic activity (SUV max 2.0) Similar small RIGHT axillary lymph nodes are normal morphology with low metabolic activity (SUV max equal 2.7 (image 472.) Incidental CT findings: No suspicious pulmonary nodules. ABDOMEN/PELVIS: There is moderate activity in the porta hepatis region with SUV max equal 4.7. No discrete enlarged nodes are present. No abnormal activity liver. The spleen is normal size and normal metabolic activity. No periaortic or iliac adenopathy. Pelvic adenopathy or inguinal adenopathy. Incidental CT findings: none SKELETON: No focal hypermetabolic activity to suggest skeletal metastasis. Incidental CT findings: No lytic lesion. EXTREMITIES: No abnormal hypermetabolic activity in the lower extremities. Incidental CT findings: No lytic or sclerotic lesion IMPRESSION: 1. Large intensely hypermetabolic LEFT axillary mass with differential including solitary massive lymph node versus soft tissue plasmacytoma. 2. Additional small bilateral axial lymph nodes with mild metabolic activity. Favor reactive nodes. 3. Metabolic activity through the porta hepatis region without enlarged nodes identified. Favor reactive activity or benign GI activity. 4. Normal spleen and bone marrow. 5.  No lytic or hypermetabolic skeletal lesion. Electronically Signed   By: Suzy Bouchard M.D.   On: 04/12/2020 14:40   CT BONE MARROW BIOPSY & ASPIRATION  Result Date: 05/02/2020 INDICATION: 74 year old male with recent diagnosis of high-grade lymphoma. He presents for CT-guided bone marrow aspiration and core biopsy for staging. EXAM: CT GUIDED BONE MARROW  ASPIRATION AND CORE BIOPSY Interventional Radiologist:  Criselda Peaches, MD MEDICATIONS: None. ANESTHESIA/SEDATION: Moderate (conscious) sedation was employed during this procedure. A total of 2 milligrams versed and 100 micrograms fentanyl were administered intravenously. The patient's level of consciousness and vital signs were monitored continuously by radiology nursing throughout the procedure under my direct supervision. Total monitored sedation time: 8 minutes FLUOROSCOPY TIME:  None COMPLICATIONS: None immediate. Estimated blood loss: <25 mL PROCEDURE: Informed written consent was obtained from the patient after a thorough discussion of the procedural risks, benefits and alternatives. All questions were addressed. Maximal Sterile Barrier Technique was utilized including caps, mask, sterile gowns, sterile gloves, sterile drape, hand hygiene and skin antiseptic. A timeout was performed prior to the initiation of the procedure. The patient was positioned prone and non-contrast localization CT was performed of the pelvis to demonstrate the iliac marrow spaces. Maximal barrier sterile technique utilized including caps, mask, sterile gowns, sterile gloves, large sterile drape, hand hygiene, and betadine prep. Under sterile conditions and local anesthesia, an 11 gauge coaxial bone biopsy needle was advanced into the right iliac marrow space. Needle position was confirmed with CT imaging. Initially, bone marrow aspiration was performed. Next, the 11 gauge outer cannula was utilized to obtain a right iliac bone marrow core biopsy. Needle was removed. Hemostasis was obtained with compression. The patient tolerated the procedure well. Samples were prepared with the cytotechnologist. IMPRESSION: Technically successful CT-guided iliac bone marrow aspiration and core biopsy. Electronically Signed   By: Jacqulynn Cadet M.D.   On: 05/02/2020 10:06   [place] CORE BIOPSY (LYMPH NODES)  Result Date: 04/13/2020 INDICATION:  No known primary, now with hypermetabolic left axillary nodal mass. Please perform ultrasound-guided biopsy for tissue diagnostic purposes. EXAM: ULTRASOUND-GUIDED LEFT AXILLARY LYMPH NODE BIOPSY COMPARISON:  PET-CT-04/12/2020; chest CT-03/30/2020 MEDICATIONS: None ANESTHESIA/SEDATION: None COMPLICATIONS: None immediate. TECHNIQUE: Informed written consent was obtained from the patient after a discussion of the risks, benefits and alternatives to treatment. Questions regarding the procedure were encouraged and answered. Initial ultrasound scanning demonstrated a large, at least 6.7 x 2.7 cm left axillary nodal conglomeration compatible with the findings seen on preceding PET-CT. An ultrasound image was saved for documentation purposes. The procedure was planned. A timeout was performed prior to the initiation of the procedure. The operative was prepped and draped in the usual sterile fashion, and a sterile drape was applied covering the operative field. A timeout was performed prior to the initiation of the procedure. Local anesthesia was provided with 1% lidocaine with epinephrine. Under direct ultrasound guidance, an 18 gauge core needle device was utilized to obtain to obtain 7 core needle biopsies of the hypermetabolic left axillary lymph node. The samples were placed in saline and submitted to pathology. The needle was removed and hemostasis was achieved with manual compression. Post procedure scan was negative for significant hematoma. A dressing was placed. The patient tolerated the procedure well without immediate postprocedural complication. IMPRESSION: Technically successful ultrasound guided biopsy of dominant hypermetabolic left axillary lymph node. Electronically Signed   By: Sandi Mariscal M.D.   On: 04/13/2020 15:49   ECHOCARDIOGRAM LIMITED  Result Date: 05/13/2020    ECHOCARDIOGRAM LIMITED  REPORT   Patient Name:   Justin Wallace  Date of Exam: 05/13/2020 Medical Rec #:  [id number]  Height:       69.0 in  Accession #:    [id number] Weight:       147.0 lb Date of Birth:  1945/09/23  BSA:          1.813 m Patient Age:    84 years   BP:           93/63 mmHg Patient Gender: M          HR:           60 bpm. Exam Location:  [facility] Procedure: 2D Echo, Cardiac Doppler and Color Doppler Indications:     Chemotherapy evaluation V 87.41  History:         Patient has no prior history of Echocardiogram examinations. No                  heart history listed in chart.  Sonographer:     Sherrie Sport RDCS (AE) Referring Phys:  [id number] Nelsie Domino Diagnosing Phys: Bartholome Bill MD IMPRESSIONS  1. Left ventricular ejection fraction, by estimation, is 50 to 55%. The left ventricle has low normal function. Left ventricular diastolic parameters were normal.  2. Right ventricular systolic function is normal. The right ventricular size is mildly enlarged.  3. Right atrial size was mildly dilated.  4. The mitral valve is grossly normal. Mild mitral valve regurgitation.  5. The aortic valve is tricuspid. Aortic valve regurgitation is mild. FINDINGS  Left Ventricle: Left ventricular ejection fraction, by estimation, is 50 to 55%. The left ventricle has low normal function. The left ventricular internal cavity size was normal in size. There is no left ventricular hypertrophy. Left ventricular diastolic parameters were normal. Right Ventricle: The right ventricular size is mildly enlarged. No increase in right ventricular wall thickness. Right ventricular systolic function is normal. Left Atrium: Left atrial size was normal in size. Right Atrium: Right atrial size was mildly dilated. Pericardium: There is no evidence of pericardial effusion. Mitral Valve: The mitral valve is grossly normal. Mild mitral valve regurgitation. Tricuspid Valve: The tricuspid valve is grossly normal. Tricuspid valve regurgitation is mild. Aortic Valve: The aortic valve is tricuspid. Aortic valve regurgitation is mild. Aortic regurgitation PHT measures 499 msec. Aortic valve  mean gradient measures 3.0 mmHg. Aortic valve peak gradient measures 5.1 mmHg. Aortic valve area, by VTI measures 2.56 cm. Pulmonic Valve: The pulmonic valve was not well visualized. Pulmonic valve regurgitation is mild. Aorta: The aortic root is normal in size and structure. IAS/Shunts: The interatrial septum was not assessed. LEFT VENTRICLE PLAX 2D LVIDd:         5.08 cm  Diastology LVIDs:         3.55 cm  LV e' medial:    7.07 cm/s LV PW:         1.02 cm  LV E/e' medial:  8.7 LV IVS:        0.77 cm  LV e' lateral:   4.03 cm/s LVOT diam:     2.10 cm  LV E/e' lateral: 15.2 LV SV:         56 LV SV Index:   31 LVOT Area:     3.46 cm  RIGHT VENTRICLE RV Basal diam:  3.86 cm RV S prime:     12.70 cm/s TAPSE (M-mode): 3.5 cm LEFT ATRIUM  Index       RIGHT ATRIUM           Index LA diam:        3.30 cm 1.82 cm/m  RA Area:     19.00 cm LA Vol (A2C):   52.2 ml 28.80 ml/m RA Volume:   54.60 ml  30.12 ml/m LA Vol (A4C):   22.9 ml 12.63 ml/m LA Biplane Vol: 36.4 ml 20.08 ml/m  AORTIC VALVE                   PULMONIC VALVE AV Area (Vmax):    2.03 cm    PV Vmax:        0.69 m/s AV Area (Vmean):   2.01 cm    PV Peak grad:   1.9 mmHg AV Area (VTI):     2.56 cm    RVOT Peak grad: 2 mmHg AV Vmax:           112.50 cm/s AV Vmean:          78.500 cm/s AV VTI:            0.218 m AV Peak Grad:      5.1 mmHg AV Mean Grad:      3.0 mmHg LVOT Vmax:         65.90 cm/s LVOT Vmean:        45.500 cm/s LVOT VTI:          0.161 m LVOT/AV VTI ratio: 0.74 AI PHT:            499 msec  AORTA Ao Root diam: 3.00 cm MITRAL VALVE               TRICUSPID VALVE MV Area (PHT): 3.93 cm    TR Peak grad:   28.9 mmHg MV Decel Time: 193 msec    TR Vmax:        269.00 cm/s MV E velocity: 61.30 cm/s MV A velocity: 66.80 cm/s  SHUNTS MV E/A ratio:  0.92        Systemic VTI:  0.16 m                            Systemic Diam: 2.10 cm Bartholome Bill MD Electronically signed by Bartholome Bill MD Signature Date/Time: 05/13/2020/12:58:21 PM    Final     DG FLUORO GUIDED LOC OF NEEDLE/CATH TIP FOR SPINAL INJECT LT  Result Date: 07/05/2020 CLINICAL DATA:  High-grade B-cell lymphoma. EXAM: FLUOROSCOPICALLY GUIDED LUMBAR PUNCTURE FOR INTRATHECAL CHEMOTHERAPY FLUOROSCOPY TIME:  4 minutes 30 seconds fluoroscopy time. Radiation dose 69.4 mGy. PROCEDURE: Informed consent was obtained from the patient prior to the procedure, including potential complications of headache, allergy, and pain. With the patient prone, the lower back was prepped with Betadine. 1% Lidocaine was used for local anesthesia. Lumbar puncture was performed at the L3-L4 level using a gauge needle with return of clearCSF. Confirmation of intrathecal needle tip location was performed by administering 3 cc of Omnipaque 180. 12 mg of methotrexate supplied by pharmacy. 12 mg of methotrexate was injected into the subarachnoid space. The patient tolerated the procedure well without apparent complication. IMPRESSION: Successful intrathecal methotrexate administration. Electronically Signed   By: Marcello Moores  Register   On: 07/05/2020 11:03   DG FLUORO GUIDED LOC OF NEEDLE/CATH TIP FOR SPINAL INJECT LT  Result Date: 06/14/2020 CLINICAL DATA:  High-grade B-cell lymphoma. Patient in for intrathecal methotrexate injection. EXAM: FLUOROSCOPICALLY GUIDED LUMBAR  PUNCTURE FOR INTRATHECAL CHEMOTHERAPY FLUOROSCOPY TIME:  1 minutes 42 seconds fluoroscopy time. 35.3 mGy radiation dose. Four images obtained. PROCEDURE: Informed consent was obtained from the patient prior to the procedure, including potential complications of headache, allergy, pain, and CSF leak. With the patient prone, the lower back was prepped with Betadine. 1% Lidocaine was used for local anesthesia. Lumbar puncture was performed at the L3-L4 level using a gauge needle with return of clearCSF. 6 cc of CSF obtained for laboratory studies. Due to slow CSF flow 2 cc of Omnipaque 300 administered into the spinal canal to ensure intrathecal location. 12  mg of methotrexate was injected into the subarachnoid space. The patient tolerated the procedure well without apparent complication. IMPRESSION: Successful lumbar puncture for intrathecal chemotherapy. No complications. Electronically Signed   By: Marcello Moores  Register   On: 06/14/2020 13:05      ASSESSMENT & PLAN:  1. 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 AIDS (acquired immune deficiency syndrome) ([place])   3. Anemia due to antineoplastic chemotherapy   4. Encounter for antineoplastic chemotherapy   5. Scalp lesion   6. 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 Left axillary mass final pathology is high-grade B-cell lymphoma, nonspecific type.   CD 20+, MYC+, BCL2-, BCL-6+, Cyclin D1-, Ki-67 >95% Status post 2 cycles of dose adjusted EPOCH with rituximab.  Patient had +1 dose level increase for cycle 2. Lab has been monitored twice weekly. ANC has been above 0.5. Today's labs are reviewed and discussed with patient.  Leukopenia, neutropenia ANC is 1.  Continue monitor labs twice weekly. If nadir ANC >0.5, potentially he may be able to go up another dose level for cycle 3 treatments. If ANC less than 0.5 for 1-2 occasions, then patient will stay with same dose level. Repeat CBC on 07/12/2020. Patient will come back to see me on 12/17 for evaluation prior to chemotherapy on 07/18/2020.  #Continue CNS prophylactic therapy with intrathecal methotrexate with each cycle of chemotherapy treatments. . Prophylaxis -Continue Bactrim  -Continue acyclovir 400 mg twice daily -Continue allopurinol 300 mg twice daily - Magic mouth wash for mucositis prophylaxis  # AIDS patient follows with ID physician.  Has been on antiretroviral therapy with Biktarvy #Scalp lesion, ?  Actinic keratosis versus other etiologies.  I recommend patient to establish care with dermatologist for evaluation.   Supportive care measures  are necessary for patient well-being and will be provided as necessary. We spent sufficient time to discuss many aspect of care, questions were answered to patient and wife's satisfaction.    No orders of the defined types were placed in this encounter.   All questions were answered. The patient knows to call the clinic with any problems questions or concerns.  cc Nobie Putnam *  Follow-up in 2 weeks for evaluation of tolerability and 3 weeks for next cycle of treatmewnt.   Earlie Server, MD, PhD Hematology Oncology [facility] at [facility] Pager- [phone] 07/08/2020

## 2020-07-11 NOTE — Telephone Encounter (Signed)
Done Pt will  RTC on 12/14 @ 3:15 for labs Time per pt request due to being out of town.

## 2020-07-12 ENCOUNTER — Inpatient Hospital Stay: Payer: Medicare HMO

## 2020-07-12 DIAGNOSIS — Z5111 Encounter for antineoplastic chemotherapy: Secondary | ICD-10-CM | POA: Diagnosis not present

## 2020-07-12 DIAGNOSIS — C8510 Unspecified B-cell lymphoma, unspecified site: Secondary | ICD-10-CM

## 2020-07-12 DIAGNOSIS — Z452 Encounter for adjustment and management of vascular access device: Secondary | ICD-10-CM | POA: Diagnosis not present

## 2020-07-12 DIAGNOSIS — B20 Human immunodeficiency virus [HIV] disease: Secondary | ICD-10-CM | POA: Diagnosis not present

## 2020-07-12 DIAGNOSIS — Z5189 Encounter for other specified aftercare: Secondary | ICD-10-CM | POA: Diagnosis not present

## 2020-07-12 DIAGNOSIS — D6481 Anemia due to antineoplastic chemotherapy: Secondary | ICD-10-CM | POA: Diagnosis not present

## 2020-07-12 DIAGNOSIS — Z5112 Encounter for antineoplastic immunotherapy: Secondary | ICD-10-CM | POA: Diagnosis not present

## 2020-07-12 DIAGNOSIS — C8514 Unspecified B-cell lymphoma, lymph nodes of axilla and upper limb: Secondary | ICD-10-CM | POA: Diagnosis not present

## 2020-07-12 DIAGNOSIS — D709 Neutropenia, unspecified: Secondary | ICD-10-CM | POA: Diagnosis not present

## 2020-07-12 LAB — CBC WITH DIFFERENTIAL/PLATELET
Abs Immature Granulocytes: 0.7 10*3/uL — ABNORMAL HIGH (ref 0.00–0.07)
Basophils Absolute: 0 10*3/uL (ref 0.0–0.1)
Basophils Relative: 0 %
Eosinophils Absolute: 0 10*3/uL (ref 0.0–0.5)
Eosinophils Relative: 0 %
HCT: 29.5 % — ABNORMAL LOW (ref 39.0–52.0)
Hemoglobin: 10.2 g/dL — ABNORMAL LOW (ref 13.0–17.0)
Immature Granulocytes: 10 %
Lymphocytes Relative: 6 %
Lymphs Abs: 0.5 10*3/uL — ABNORMAL LOW (ref 0.7–4.0)
MCH: 31.2 pg (ref 26.0–34.0)
MCHC: 34.6 g/dL (ref 30.0–36.0)
MCV: 90.2 fL (ref 80.0–100.0)
Monocytes Absolute: 0.4 10*3/uL (ref 0.1–1.0)
Monocytes Relative: 5 %
Neutro Abs: 5.5 10*3/uL (ref 1.7–7.7)
Neutrophils Relative %: 79 %
Platelets: 124 10*3/uL — ABNORMAL LOW (ref 150–400)
RBC: 3.27 MIL/uL — ABNORMAL LOW (ref 4.22–5.81)
RDW: 15.6 % — ABNORMAL HIGH (ref 11.5–15.5)
Smear Review: NORMAL
WBC: 7.1 10*3/uL (ref 4.0–10.5)
nRBC: 0.3 % — ABNORMAL HIGH (ref 0.0–0.2)

## 2020-07-14 MED FILL — BIKTARVY 50-200-25 MG TABS: 50-200-25 | 30 days supply | Qty: 30 | Fill #2

## 2020-07-15 ENCOUNTER — Ambulatory Visit: Payer: Medicare HMO

## 2020-07-15 ENCOUNTER — Encounter: Payer: Self-pay | Admitting: Oncology

## 2020-07-15 ENCOUNTER — Other Ambulatory Visit: Payer: Self-pay

## 2020-07-15 ENCOUNTER — Inpatient Hospital Stay: Payer: Medicare HMO

## 2020-07-15 ENCOUNTER — Inpatient Hospital Stay (HOSPITAL_BASED_OUTPATIENT_CLINIC_OR_DEPARTMENT_OTHER): Payer: Medicare HMO | Admitting: Oncology

## 2020-07-15 VITALS — BP 105/68 | HR 75 | Temp 96.9°F | Resp 16 | Wt 166.4 lb

## 2020-07-15 DIAGNOSIS — Z452 Encounter for adjustment and management of vascular access device: Secondary | ICD-10-CM | POA: Diagnosis not present

## 2020-07-15 DIAGNOSIS — C8510 Unspecified B-cell lymphoma, unspecified site: Secondary | ICD-10-CM

## 2020-07-15 DIAGNOSIS — C8514 Unspecified B-cell lymphoma, lymph nodes of axilla and upper limb: Secondary | ICD-10-CM | POA: Diagnosis not present

## 2020-07-15 DIAGNOSIS — D6481 Anemia due to antineoplastic chemotherapy: Secondary | ICD-10-CM | POA: Diagnosis not present

## 2020-07-15 DIAGNOSIS — T451X5A Adverse effect of antineoplastic and immunosuppressive drugs, initial encounter: Secondary | ICD-10-CM | POA: Diagnosis not present

## 2020-07-15 DIAGNOSIS — Z5111 Encounter for antineoplastic chemotherapy: Secondary | ICD-10-CM | POA: Insufficient documentation

## 2020-07-15 DIAGNOSIS — B20 Human immunodeficiency virus [HIV] disease: Secondary | ICD-10-CM | POA: Diagnosis not present

## 2020-07-15 DIAGNOSIS — Z5112 Encounter for antineoplastic immunotherapy: Secondary | ICD-10-CM | POA: Diagnosis not present

## 2020-07-15 DIAGNOSIS — D709 Neutropenia, unspecified: Secondary | ICD-10-CM | POA: Diagnosis not present

## 2020-07-15 DIAGNOSIS — Z5189 Encounter for other specified aftercare: Secondary | ICD-10-CM | POA: Diagnosis not present

## 2020-07-15 LAB — COMPREHENSIVE METABOLIC PANEL
ALT: 18 U/L (ref 0–44)
AST: 21 U/L (ref 15–41)
Albumin: 3.4 g/dL — ABNORMAL LOW (ref 3.5–5.0)
Alkaline Phosphatase: 103 U/L (ref 38–126)
Anion gap: 8 (ref 5–15)
BUN: 18 mg/dL (ref 8–23)
CO2: 27 mmol/L (ref 22–32)
Calcium: 8.9 mg/dL (ref 8.9–10.3)
Chloride: 105 mmol/L (ref 98–111)
Creatinine, Ser: 1.1 mg/dL (ref 0.61–1.24)
GFR, Estimated: >60 mL/min (ref >60)
Glucose, Bld: 96 mg/dL (ref 70–99)
Potassium: 4.2 mmol/L (ref 3.5–5.1)
Sodium: 140 mmol/L (ref 135–145)
Total Bilirubin: 0.5 mg/dL (ref 0.3–1.2)
Total Protein: 6.4 g/dL — ABNORMAL LOW (ref 6.5–8.1)

## 2020-07-15 LAB — CBC WITH DIFFERENTIAL/PLATELET
Abs Immature Granulocytes: 2.5 10*3/uL — ABNORMAL HIGH (ref 0.00–0.07)
Basophils Absolute: 0 10*3/uL (ref 0.0–0.1)
Basophils Relative: 0 %
Eosinophils Absolute: 0.1 10*3/uL (ref 0.0–0.5)
Eosinophils Relative: 1 %
HCT: 31.2 % — ABNORMAL LOW (ref 39.0–52.0)
Hemoglobin: 10.5 g/dL — ABNORMAL LOW (ref 13.0–17.0)
Immature Granulocytes: 33 %
Lymphocytes Relative: 7 %
Lymphs Abs: 0.6 10*3/uL — ABNORMAL LOW (ref 0.7–4.0)
MCH: 30.6 pg (ref 26.0–34.0)
MCHC: 33.7 g/dL (ref 30.0–36.0)
MCV: 91 fL (ref 80.0–100.0)
Monocytes Absolute: 0.9 10*3/uL (ref 0.1–1.0)
Monocytes Relative: 12 %
Neutro Abs: 3.6 10*3/uL (ref 1.7–7.7)
Neutrophils Relative %: 47 %
Platelets: 258 10*3/uL (ref 150–400)
RBC: 3.43 MIL/uL — ABNORMAL LOW (ref 4.22–5.81)
RDW: 17.3 % — ABNORMAL HIGH (ref 11.5–15.5)
Smear Review: ADEQUATE
WBC: 7.7 10*3/uL (ref 4.0–10.5)
nRBC: 1.3 % — ABNORMAL HIGH (ref 0.0–0.2)

## 2020-07-15 LAB — PHOSPHORUS: Phosphorus: 4.9 mg/dL — ABNORMAL HIGH (ref 2.5–4.6)

## 2020-07-15 LAB — URIC ACID: Uric Acid, Serum: 3.5 mg/dL — ABNORMAL LOW (ref 3.7–8.6)

## 2020-07-15 NOTE — Progress Notes (Signed)
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

## 2020-07-15 NOTE — Progress Notes (Signed)
Hematology/Oncology follow up note [facility] [phone] [phone]   Patient Care Team: Olin Hauser, DO as PCP - General (Family Medicine) Earlie Server, MD as Consulting Physician (Hematology and Oncology)  REFERRING PROVIDER: Nobie Putnam *  CHIEF COMPLAINTS/REASON FOR VISIT:  Follow up for axillary mass  HISTORY OF PRESENTING ILLNESS:   Justin Wallace is a  74 y.o.  male with PMH listed below was seen in consultation at the request of  Nobie Putnam *  for evaluation of symptomatic anemia Patient reported history of sudden onset of diffuse body aches, decreased appetite, headache low-grade fever, profound weakness/fatigue about 2 weeks ago.  Prior to the onset of symptoms, he went to a funeral as well as applicable clinic.  He reports that he has received COVID-19 vaccination previously.  He moved from [place].  Daughter is an Therapist, sports and told him to get COVID-19 PCR checked and he was tested negative. Patient also reports a sudden drop of weight since the onset of symptoms.  Patient has a history of celiac disease.  No colonoscopy records or pathology records in current EMR.  Diagnosis was done by [facility] gastroenterology.  Patient reports that he was in his usual state of health until the onset of symptoms.  03/10/2020, patient was evaluated by primary care provider.  Blood work showed mild anemia with hemoglobin of 13.1, Iron panel showed decreased saturation of 8, ferritin 218, TIBC 291, patient was referred to hematology for evaluation of symptomatic anemia and iron deficiency.  Patient reports that her body aches, fever and headache symptoms have improved.  However he continues to feel very tired and fatigued.  Appetite has decreased and weight remains low.  # Physical examination showed left axillary mass  ultrasound confirmed a left axillary large soft tissue mass up to 6.8 cm with internal vascularity. 03/30/2020  CT chest with contrast showed soft tissue mass/enlarged lymph node in the left axilla 6.3 cm, concerning for primary mass or metastatic lymphadenopathy.  There are additional prominent although much smaller right axillary lymph nodes, pretracheal and superior mediastinal lymph nodes.  Findings are concerning for malignancy such as lymphoma or nodal metastatic disease.  # treated for Lymes disease, finished doxycycline 176m BID for 21 days course. 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 he has started treatment with Biktarvy, also started on Bactrim daily.  04/26/2020 PET scan showed large intensely hypermetabolic left axillary mass SUV 28.  Additional small bilateral axilla lymph node with mild metabolic activity SUV 2.  Metabolic activity through the porta hepatis region without enlarged nodes identified.  No bone lesions. 05/02/2020 bone marrow biopsy showed hypercellular marrow for age with trilineage hematopoiesis.  Increased number of CD10 positive B cells, cannot rule out minimal or subtle involvement of marrow by the high-grade B-cell lymphoma. Normal cytogenetics. 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  Positive for BCL6 and MYC expression.  FISH testing showed negative for BCL-2/BCL6/MYC gene rearrangement.   Trisomy 3 and 18 were identified and are nonspecific findings seen in clonal lymphoid neoplasm.  Final results were signed out on 05/30/2020-due to the delay of  FISH results.  B sed on the FISH results, lymphoma is best classified as high-grade B-cell lymphoma not otherwise specified.  It does not meet morphological criteria for diffuse large B-cell lymphoma nor molecular criteria for BRCA lymphoma.  11 q. alteration testing has been sent.  Results are pending. 04/26/2020, MUGA testing showed normal  LVEF of 56% with normal LV wall motion.  05/13/2020, echocardiogram showed low normal end ejection fraction of 50 to  55%.  #04/19/2020 Mediport placed by Dr. Lucky Cowboy  INTERVAL HISTORY Justin Wallace is a 74 y.o. male who has above history reviewed by me today presents for follow up visit for high-grade B-cell lymphoma Problems and complaints are listed below: Patient finished cycle 2 dose adjusted R-EPOCH treatment.   Overall he tolerates well. Slightly more tired. Appetite is fair Weight is stable. Denies any fever, chills, nausea vomiting diarrhea.  Review of Systems  Constitutional: Positive for fatigue. Negative for appetite change, chills, fever and unexpected weight change.  HENT:   Negative for hearing loss and voice change.   Eyes: Negative for eye problems and icterus.  Respiratory: Negative for chest tightness, cough and shortness of breath.   Cardiovascular: Negative for chest pain and leg swelling.  Gastrointestinal: Negative for abdominal distention and abdominal pain.  Endocrine: Negative for hot flashes.  Genitourinary: Negative for difficulty urinating, dysuria and frequency.   Musculoskeletal: Negative for arthralgias.  Skin: Negative for itching and rash.  Neurological: Negative for light-headedness and numbness.  Hematological: Negative for adenopathy. Does not bruise/bleed easily.  Psychiatric/Behavioral: Negative for confusion.    MEDICAL HISTORY:  Past Medical History:  Diagnosis Date  . Celiac disease   . 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 HIV (human immunodeficiency virus infection) ([place])   . Lyme disease   . Lymphoma ([place])     SURGICAL HISTORY: Past Surgical History:  Procedure Laterality Date  . PORTA CATH INSERTION N/A 04/25/2020   Procedure: PORTA CATH INSERTION;  Surgeon: Algernon Huxley, MD;  Location: [place] [facility];  Service: Cardiovascular;  Laterality: N/A;    SOCIAL HISTORY: Social History   Socioeconomic History  . Marital status: Married    Spouse name: Opal Sidles   . Number of children: 2  . Years of education: Not on file  . Highest  education level: Not on file  Occupational History  . Not on file  Tobacco Use  . Smoking status: Never Smoker  . Smokeless tobacco: Never Used  Vaping Use  . Vaping Use: Never used  Substance and Sexual Activity  . Alcohol use: Yes    Comment: occasional  . Drug use: Never  . Sexual activity: Never  Other Topics Concern  . Not on file  Social History Narrative  . Not on file   Social Determinants of Health   Financial Resource Strain: Not on file  Food Insecurity: Not on file  Transportation Needs: Not on file  Physical Activity: Not on file  Stress: Not on file  Social Connections: Not on file  Intimate Partner Violence: Not on file    FAMILY HISTORY: Family History  Problem Relation Age of Onset  . Hyperlipidemia Mother   . Brain cancer Father     ALLERGIES:  has No Known Allergies.  MEDICATIONS:  Current Outpatient Medications  Medication Sig Dispense Refill  . acyclovir (ZOVIRAX) 400 MG tablet Take 1 tablet (400 mg total) by mouth 2 (two) times daily. 60 tablet 5  . allopurinol (ZYLOPRIM) 300 MG tablet Take 1 tablet (300 mg total) by mouth 2 (two) times daily. 60 tablet 0  . bictegravir-emtricitabine-tenofovir AF (BIKTARVY) 50-200-25 MG TABS tablet Take 1 tablet by mouth daily. 30 tablet 5  . lidocaine-prilocaine (EMLA) cream Apply to affected area once 30 g 3  . magic mouthwash w/lidocaine SOLN Take 5 mLs by mouth 4 (four)  times daily as needed for mouth pain. Sig: Swish/spit 5-10 ml four times a day as needed 480 mL 1  . ondansetron (ZOFRAN) 8 MG tablet Take 1 tablet (8 mg total) by mouth 2 (two) times daily as needed for refractory nausea / vomiting. Start on the third day after chemotherapy. 30 tablet 1  . polyethylene glycol powder (GLYCOLAX/MIRALAX) 17 GM/SCOOP powder Take by mouth as needed.    . predniSONE (DELTASONE) 50 MG tablet Take 2 tablets on Day 3, 4, 5 of each chemotherapy treatments. 30 tablet 0  . prochlorperazine (COMPAZINE) 10 MG tablet Take 1  tablet (10 mg total) by mouth every 6 (six) hours as needed (Nausea or vomiting). 30 tablet 1  . sulfamethoxazole-trimethoprim (BACTRIM DS) 800-160 MG tablet Take 1 tablet by mouth daily. 30 tablet 2   No current facility-administered medications for this visit.   Facility-Administered Medications Ordered in Other Visits  Medication Dose Route Frequency Provider Last Rate Last Admin  . methotrexate (PF) 12 mg in sodium chloride (PF) 0.9 % INTRATHECAL chemo injection   Intrathecal Once Earlie Server, MD      . methotrexate (PF) 12 mg in sodium chloride (PF) 0.9 % INTRATHECAL chemo injection   Intrathecal Once Earlie Server, MD         PHYSICAL EXAMINATION: ECOG PERFORMANCE STATUS: 1 - Symptomatic but completely ambulatory Vitals:   07/15/20 0907  BP: 105/68  Pulse: 75  Resp: 16  Temp: (!) 96.9 F (36.1 C)  SpO2: 100%   Filed Weights   07/15/20 0907  Weight: 166 lb 6.4 oz (75.5 kg)    Physical Exam Constitutional:      General: He is not in acute distress. HENT:     Head: Normocephalic and atraumatic.  Eyes:     General: No scleral icterus. Cardiovascular:     Rate and Rhythm: Normal rate and regular rhythm.     Heart sounds: Normal heart sounds.  Pulmonary:     Effort: Pulmonary effort is normal. No respiratory distress.     Breath sounds: No wheezing.  Abdominal:     General: Bowel sounds are normal. There is no distension.     Palpations: Abdomen is soft.  Musculoskeletal:        General: No deformity. Normal range of motion.     Cervical back: Normal range of motion and neck supple.     Comments: Left axillary mass is no longer palpable  Skin:    General: Skin is warm and dry.     Findings: No erythema or rash.  Neurological:     Mental Status: He is alert and oriented to person, place, and time. Mental status is at baseline.     Cranial Nerves: No cranial nerve deficit.     Coordination: Coordination normal.  Psychiatric:        Mood and Affect: Mood normal.        LABORATORY DATA:  I have reviewed the data as listed Lab Results  Component Value Date   WBC 7.7 07/15/2020   HGB 10.5 (L) 07/15/2020   HCT 31.2 (L) 07/15/2020   MCV 91.0 07/15/2020   PLT 258 07/15/2020   Recent Labs    03/10/20 1113 03/17/20 1029 05/05/20 0956 06/06/20 0822 06/27/20 0818 07/04/20 1001 07/08/20 0845 07/15/20 0848  NA 138 137 139   < > 138 141 136 140  K 4.8 3.8 4.4   < > 4.1 4.3 4.3 4.2  CL 106 104 107   < >  105 105 100 105  CO2 27 27 27    < > 25 30 27 27   GLUCOSE 94 97 97   < > 97 80 115* 96  BUN 23 21 19    < > 19 17 20 18   CREATININE 0.91 1.09 0.88   < > 0.97 0.89 0.78 1.10  CALCIUM 8.4* 8.1* 9.1   < > 8.6* 9.0 8.8* 8.9  GFRNONAA 84 >60 86   < > >60  --  >60 >60  GFRAA 98 >60 100  --   --   --   --   --   PROT 6.8 8.0 7.4   < > 6.4* 5.9* 6.6 6.4*  ALBUMIN  --  2.7*  --    < > 3.1*  --  3.5 3.4*  AST 20 24 15    < > 21 13 17 21   ALT 13 18 8*   < > 20 13 18 18   ALKPHOS  --  140*  --    < > 113  --  117 103  BILITOT 0.4 0.3 0.5   < > 0.2* 0.4 0.7 0.5   < > = values in this interval not displayed.   Iron/TIBC/Ferritin/ %Sat    Component Value Date/Time   IRON 22 (L) 03/17/2020 1029   IRON 17 02/17/2018 0000   TIBC 286 03/17/2020 1029   TIBC 425 02/17/2018 0000   FERRITIN 284 03/17/2020 1029   IRONPCTSAT 8 (L) 03/17/2020 1029   IRONPCTSAT 8 (L) 03/10/2020 1113      RADIOGRAPHIC STUDIES: I have personally reviewed the radiological images as listed and agreed with the findings in the report. DG FLUORO GUIDED LOC OF NEEDLE/CATH TIP FOR SPINAL INJECT LT  Result Date: 07/05/2020 CLINICAL DATA:  High-grade B-cell lymphoma. EXAM: FLUOROSCOPICALLY GUIDED LUMBAR PUNCTURE FOR INTRATHECAL CHEMOTHERAPY FLUOROSCOPY TIME:  4 minutes 30 seconds fluoroscopy time. Radiation dose 69.4 mGy. PROCEDURE: Informed consent was obtained from the patient prior to the procedure, including potential complications of headache, allergy, and pain. With the patient  prone, the lower back was prepped with Betadine. 1% Lidocaine was used for local anesthesia. Lumbar puncture was performed at the L3-L4 level using a gauge needle with return of clearCSF. Confirmation of intrathecal needle tip location was performed by administering 3 cc of Omnipaque 180. 12 mg of methotrexate supplied by pharmacy. 12 mg of methotrexate was injected into the subarachnoid space. The patient tolerated the procedure well without apparent complication. IMPRESSION: Successful intrathecal methotrexate administration. Electronically Signed   By: Marcello Moores  Register   On: 07/05/2020 11:03   NM Cardiac Muga Rest  Result Date: 04/27/2020 CLINICAL DATA:  High-grade B-cell lymphoma, pre chemotherapy assessment EXAM: NUCLEAR MEDICINE CARDIAC BLOOD POOL IMAGING (MUGA) TECHNIQUE: Cardiac multi-gated acquisition was performed at rest following intravenous injection of Tc-74mlabeled red blood cells. RADIOPHARMACEUTICALS:  22.01 mCi Tc-919mertechnetate in-vitro labeled red blood cells IV COMPARISON:  None FINDINGS: Calculated LEFT ventricular ejection fraction is 56%, normal. Study was obtained at a cardiac rate of 62 bpm. Patient was rhythmic during imaging. Cine analysis of the LEFT ventricle in 3 projections demonstrates normal LV wall motion. IMPRESSION: Normal LEFT ventricular ejection fraction of 56% with normal LV wall motion. Electronically Signed   By: MaLavonia Dana.D.   On: 04/27/2020 11:42   PERIPHERAL VASCULAR CATHETERIZATION  Result Date: 04/25/2020 See op note  NM Myocar Multi W/Spect W/Wall Motion / EF  Result Date: 06/15/2020  There was no ST segment deviation noted during stress.  The study is normal.  This is a low risk study.  The left ventricular ejection fraction is low normal (51%).  There is no evidence for ischemia    CT BONE MARROW BIOPSY & ASPIRATION  Result Date: 05/02/2020 INDICATION: 74 year old male with recent diagnosis of high-grade lymphoma. He presents for  CT-guided bone marrow aspiration and core biopsy for staging. EXAM: CT GUIDED BONE MARROW ASPIRATION AND CORE BIOPSY Interventional Radiologist:  Criselda Peaches, MD MEDICATIONS: None. ANESTHESIA/SEDATION: Moderate (conscious) sedation was employed during this procedure. A total of 2 milligrams versed and 100 micrograms fentanyl were administered intravenously. The patient's level of consciousness and vital signs were monitored continuously by radiology nursing throughout the procedure under my direct supervision. Total monitored sedation time: 8 minutes FLUOROSCOPY TIME:  None COMPLICATIONS: None immediate. Estimated blood loss: <25 mL PROCEDURE: Informed written consent was obtained from the patient after a thorough discussion of the procedural risks, benefits and alternatives. All questions were addressed. Maximal Sterile Barrier Technique was utilized including caps, mask, sterile gowns, sterile gloves, sterile drape, hand hygiene and skin antiseptic. A timeout was performed prior to the initiation of the procedure. The patient was positioned prone and non-contrast localization CT was performed of the pelvis to demonstrate the iliac marrow spaces. Maximal barrier sterile technique utilized including caps, mask, sterile gowns, sterile gloves, large sterile drape, hand hygiene, and betadine prep. Under sterile conditions and local anesthesia, an 11 gauge coaxial bone biopsy needle was advanced into the right iliac marrow space. Needle position was confirmed with CT imaging. Initially, bone marrow aspiration was performed. Next, the 11 gauge outer cannula was utilized to obtain a right iliac bone marrow core biopsy. Needle was removed. Hemostasis was obtained with compression. The patient tolerated the procedure well. Samples were prepared with the cytotechnologist. IMPRESSION: Technically successful CT-guided iliac bone marrow aspiration and core biopsy. Electronically Signed   By: Jacqulynn Cadet M.D.   On:  05/02/2020 10:06   ECHOCARDIOGRAM LIMITED  Result Date: 05/13/2020    ECHOCARDIOGRAM LIMITED REPORT   Patient Name:   Justin Wallace  Date of Exam: 05/13/2020 Medical Rec #:  [id number]  Height:       69.0 in Accession #:    [id number] Weight:       147.0 lb Date of Birth:  04-Jan-1946  BSA:          1.813 m Patient Age:    85 years   BP:           93/63 mmHg Patient Gender: M          HR:           60 bpm. Exam Location:  [facility] Procedure: 2D Echo, Cardiac Doppler and Color Doppler Indications:     Chemotherapy evaluation V 87.41  History:         Patient has no prior history of Echocardiogram examinations. No                  heart history listed in chart.  Sonographer:     Sherrie Sport RDCS (AE) Referring Phys:  [id number] Emma Birchler Diagnosing Phys: Bartholome Bill MD IMPRESSIONS  1. Left ventricular ejection fraction, by estimation, is 50 to 55%. The left ventricle has low normal function. Left ventricular diastolic parameters were normal.  2. Right ventricular systolic function is normal. The right ventricular size is mildly enlarged.  3. Right atrial size was mildly dilated.  4. The mitral valve is grossly normal. Mild mitral valve regurgitation.  5. The aortic valve is tricuspid. Aortic valve regurgitation is mild. FINDINGS  Left Ventricle: Left ventricular ejection fraction, by estimation, is 50 to 55%. The left ventricle has low normal function. The left ventricular internal cavity size was normal in size. There is no left ventricular hypertrophy. Left ventricular diastolic parameters were normal. Right Ventricle: The right ventricular size is mildly enlarged. No increase in right ventricular wall thickness. Right ventricular systolic function is normal. Left Atrium: Left atrial size was normal in size. Right Atrium: Right atrial size was mildly dilated. Pericardium: There is no evidence of pericardial effusion. Mitral Valve: The mitral valve is grossly normal. Mild mitral valve regurgitation. Tricuspid Valve: The  tricuspid valve is grossly normal. Tricuspid valve regurgitation is mild. Aortic Valve: The aortic valve is tricuspid. Aortic valve regurgitation is mild. Aortic regurgitation PHT measures 499 msec. Aortic valve mean gradient measures 3.0 mmHg. Aortic valve peak gradient measures 5.1 mmHg. Aortic valve area, by VTI measures 2.56 cm. Pulmonic Valve: The pulmonic valve was not well visualized. Pulmonic valve regurgitation is mild. Aorta: The aortic root is normal in size and structure. IAS/Shunts: The interatrial septum was not assessed. LEFT VENTRICLE PLAX 2D LVIDd:         5.08 cm  Diastology LVIDs:         3.55 cm  LV e' medial:    7.07 cm/s LV PW:         1.02 cm  LV E/e' medial:  8.7 LV IVS:        0.77 cm  LV e' lateral:   4.03 cm/s LVOT diam:     2.10 cm  LV E/e' lateral: 15.2 LV SV:         56 LV SV Index:   31 LVOT Area:     3.46 cm  RIGHT VENTRICLE RV Basal diam:  3.86 cm RV S prime:     12.70 cm/s TAPSE (M-mode): 3.5 cm LEFT ATRIUM             Index       RIGHT ATRIUM           Index LA diam:        3.30 cm 1.82 cm/m  RA Area:     19.00 cm LA Vol (A2C):   52.2 ml 28.80 ml/m RA Volume:   54.60 ml  30.12 ml/m LA Vol (A4C):   22.9 ml 12.63 ml/m LA Biplane Vol: 36.4 ml 20.08 ml/m  AORTIC VALVE                   PULMONIC VALVE AV Area (Vmax):    2.03 cm    PV Vmax:        0.69 m/s AV Area (Vmean):   2.01 cm    PV Peak grad:   1.9 mmHg AV Area (VTI):     2.56 cm    RVOT Peak grad: 2 mmHg AV Vmax:           112.50 cm/s AV Vmean:          78.500 cm/s AV VTI:            0.218 m AV Peak Grad:      5.1 mmHg AV Mean Grad:      3.0 mmHg LVOT Vmax:         65.90 cm/s LVOT Vmean:        45.500 cm/s LVOT VTI:          0.161 m LVOT/AV  VTI ratio: 0.74 AI PHT:            499 msec  AORTA Ao Root diam: 3.00 cm MITRAL VALVE               TRICUSPID VALVE MV Area (PHT): 3.93 cm    TR Peak grad:   28.9 mmHg MV Decel Time: 193 msec    TR Vmax:        269.00 cm/s MV E velocity: 61.30 cm/s MV A velocity: 66.80 cm/s  SHUNTS  MV E/A ratio:  0.92        Systemic VTI:  0.16 m                            Systemic Diam: 2.10 cm Bartholome Bill MD Electronically signed by Bartholome Bill MD Signature Date/Time: 05/13/2020/12:58:21 PM    Final    DG FLUORO GUIDED LOC OF NEEDLE/CATH TIP FOR SPINAL INJECT LT  Result Date: 07/05/2020 CLINICAL DATA:  High-grade B-cell lymphoma. EXAM: FLUOROSCOPICALLY GUIDED LUMBAR PUNCTURE FOR INTRATHECAL CHEMOTHERAPY FLUOROSCOPY TIME:  4 minutes 30 seconds fluoroscopy time. Radiation dose 69.4 mGy. PROCEDURE: Informed consent was obtained from the patient prior to the procedure, including potential complications of headache, allergy, and pain. With the patient prone, the lower back was prepped with Betadine. 1% Lidocaine was used for local anesthesia. Lumbar puncture was performed at the L3-L4 level using a gauge needle with return of clearCSF. Confirmation of intrathecal needle tip location was performed by administering 3 cc of Omnipaque 180. 12 mg of methotrexate supplied by pharmacy. 12 mg of methotrexate was injected into the subarachnoid space. The patient tolerated the procedure well without apparent complication. IMPRESSION: Successful intrathecal methotrexate administration. Electronically Signed   By: Marcello Moores  Register   On: 07/05/2020 11:03   DG FLUORO GUIDED LOC OF NEEDLE/CATH TIP FOR SPINAL INJECT LT  Result Date: 06/14/2020 CLINICAL DATA:  High-grade B-cell lymphoma. Patient in for intrathecal methotrexate injection. EXAM: FLUOROSCOPICALLY GUIDED LUMBAR PUNCTURE FOR INTRATHECAL CHEMOTHERAPY FLUOROSCOPY TIME:  1 minutes 42 seconds fluoroscopy time. 35.3 mGy radiation dose. Four images obtained. PROCEDURE: Informed consent was obtained from the patient prior to the procedure, including potential complications of headache, allergy, pain, and CSF leak. With the patient prone, the lower back was prepped with Betadine. 1% Lidocaine was used for local anesthesia. Lumbar puncture was performed at the L3-L4  level using a gauge needle with return of clearCSF. 6 cc of CSF obtained for laboratory studies. Due to slow CSF flow 2 cc of Omnipaque 300 administered into the spinal canal to ensure intrathecal location. 12 mg of methotrexate was injected into the subarachnoid space. The patient tolerated the procedure well without apparent complication. IMPRESSION: Successful lumbar puncture for intrathecal chemotherapy. No complications. Electronically Signed   By: Marcello Moores  Register   On: 06/14/2020 13:05      ASSESSMENT & PLAN:  1. 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 AIDS (acquired immune deficiency syndrome) ([place])   3. Anemia due to antineoplastic chemotherapy   4. 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 Left axillary mass final pathology is high-grade B-cell lymphoma, nonspecific type.   CD 20+, MYC+, BCL2-, BCL-6+, Cyclin D1-, Ki-67 >95% Status post 2 cycles of dose adjusted EPOCH with rituximab.  Patient had +1 dose level increase for cycle 2. Proceed with cycle 3 dose adjusted Epoch with rituximab. As stated +1 dose level-same  as cycle 2 Repeat PET after cycle 3  Leukopenia, ANC nadir is 1 Cancer center is going to be closed on 07/22/2020 as well as 07/29/2020. Patient patient will proceed with cycle 3 dose adjusted Epoch with same dose level as cycle 2 From 12/20-12/22, he will have pump discontinued on 12/23, he will resume the rest of the chemotherapy on 12/27-12/28. Dex 35m on 12/20, Prednisone 1075mon 12/21,12/22, 12/27, 12/28  #Continue CNS prophylactic therapy with intrathecal methotrexate with each cycle of chemotherapy treatments-12/29 Prophylaxis -Continue Bactrim  -Continue acyclovir 400 mg twice daily -Continue allopurinol 300 mg twice daily - Magic mouth wash for mucositis prophylaxis  # AIDS patient follows with ID physician.  Has been on antiretroviral therapy with  Biktarvy  Supportive care measures are necessary for patient well-being and will be provided as necessary. We spent sufficient time to discuss many aspect of care, questions were answered to patient and wife's satisfaction.    Orders Placed This Encounter  Procedures  . NM PET Image Restage (PS) Skull Base to Thigh    Standing Status:   Future    Standing Expiration Date:   07/15/2021    Order Specific Question:   If indicated for the ordered procedure, I authorize the administration of a radiopharmaceutical per Radiology protocol    Answer:   Yes    Order Specific Question:   Preferred imaging location?    Answer:   [facility]    Order Specific Question:   Radiology Contrast Protocol - do NOT remove file path    Answer:   [email or web address]  . CBC with Differential/Platelet    Standing Status:   Future    Standing Expiration Date:   07/15/2021  . CBC with Differential/Platelet    Standing Status:   Future    Standing Expiration Date:   07/15/2021    All questions were answered. The patient knows to call the clinic with any problems questions or concerns.  cc KaNobie Putnam  Follow-up in 3 weeks for next cycle of treatmewnt.   ZhEarlie ServerMD, PhD Hematology Oncology [facility] LLC[facility]- [phone]/17/2021

## 2020-07-18 ENCOUNTER — Ambulatory Visit: Payer: Medicare HMO | Admitting: Internal Medicine

## 2020-07-18 ENCOUNTER — Other Ambulatory Visit: Payer: Self-pay

## 2020-07-18 ENCOUNTER — Inpatient Hospital Stay: Payer: Medicare HMO

## 2020-07-18 VITALS — BP 101/62 | HR 72 | Temp 97.0°F | Resp 18 | Wt 168.4 lb

## 2020-07-18 DIAGNOSIS — C8514 Unspecified B-cell lymphoma, lymph nodes of axilla and upper limb: Secondary | ICD-10-CM | POA: Diagnosis not present

## 2020-07-18 DIAGNOSIS — C8512 Unspecified B-cell lymphoma, intrathoracic lymph nodes: Secondary | ICD-10-CM | POA: Diagnosis not present

## 2020-07-18 DIAGNOSIS — B20 Human immunodeficiency virus [HIV] disease: Secondary | ICD-10-CM | POA: Diagnosis not present

## 2020-07-18 DIAGNOSIS — C8510 Unspecified B-cell lymphoma, unspecified site: Secondary | ICD-10-CM

## 2020-07-18 DIAGNOSIS — Z452 Encounter for adjustment and management of vascular access device: Secondary | ICD-10-CM | POA: Diagnosis not present

## 2020-07-18 DIAGNOSIS — D6481 Anemia due to antineoplastic chemotherapy: Secondary | ICD-10-CM | POA: Diagnosis not present

## 2020-07-18 DIAGNOSIS — Z5112 Encounter for antineoplastic immunotherapy: Secondary | ICD-10-CM | POA: Diagnosis not present

## 2020-07-18 DIAGNOSIS — Z5111 Encounter for antineoplastic chemotherapy: Secondary | ICD-10-CM | POA: Diagnosis not present

## 2020-07-18 DIAGNOSIS — D709 Neutropenia, unspecified: Secondary | ICD-10-CM | POA: Diagnosis not present

## 2020-07-18 DIAGNOSIS — Z5189 Encounter for other specified aftercare: Secondary | ICD-10-CM | POA: Diagnosis not present

## 2020-07-18 MED ORDER — ACETAMINOPHEN 325 MG PO TABS
650.0000 mg | ORAL_TABLET | Freq: Once | ORAL | Status: AC
  Administered 2020-07-18: 650 mg via ORAL
  Filled 2020-07-18: qty 2

## 2020-07-18 MED ORDER — PALONOSETRON HCL INJECTION 0.25 MG/5ML
0.2500 mg | Freq: Once | INTRAVENOUS | Status: AC
  Administered 2020-07-18: 0.25 mg via INTRAVENOUS
  Filled 2020-07-18: qty 5

## 2020-07-18 MED ORDER — SODIUM CHLORIDE 0.9 % IV SOLN
20.0000 mg | Freq: Once | INTRAVENOUS | Status: AC
  Administered 2020-07-18: 20 mg via INTRAVENOUS
  Filled 2020-07-18: qty 20

## 2020-07-18 MED ORDER — SODIUM CHLORIDE 0.9 % IV SOLN
375.0000 mg/m2 | Freq: Once | INTRAVENOUS | Status: AC
  Administered 2020-07-18: 700 mg via INTRAVENOUS
  Filled 2020-07-18: qty 50

## 2020-07-18 MED ORDER — SODIUM CHLORIDE 0.9 % IV SOLN
Freq: Once | INTRAVENOUS | Status: AC
Start: 2020-07-18 — End: 2020-07-18
  Filled 2020-07-18: qty 250

## 2020-07-18 MED ORDER — DIPHENHYDRAMINE HCL 25 MG PO CAPS
50.0000 mg | ORAL_CAPSULE | Freq: Once | ORAL | Status: AC
  Administered 2020-07-18: 50 mg via ORAL
  Filled 2020-07-18: qty 2

## 2020-07-18 MED ORDER — SODIUM CHLORIDE 0.9% FLUSH
10.0000 mL | INTRAVENOUS | Status: DC | PRN
  Administered 2020-07-18 (×2): 10 mL
  Filled 2020-07-18: qty 10

## 2020-07-18 MED ORDER — VINCRISTINE SULFATE CHEMO INJECTION 1 MG/ML
Freq: Once | INTRAVENOUS | Status: AC
  Filled 2020-07-18: qty 11

## 2020-07-18 NOTE — Progress Notes (Signed)
1216- Patient tolerated treatment well. Patient stable and discharged to home with Adriamycin, Etoposide, Vincristine Continuous Infusion pump in place.

## 2020-07-19 ENCOUNTER — Other Ambulatory Visit: Payer: Self-pay

## 2020-07-19 ENCOUNTER — Telehealth: Payer: Self-pay

## 2020-07-19 ENCOUNTER — Encounter: Payer: Self-pay | Admitting: Internal Medicine

## 2020-07-19 ENCOUNTER — Inpatient Hospital Stay: Payer: Medicare HMO

## 2020-07-19 ENCOUNTER — Ambulatory Visit (INDEPENDENT_AMBULATORY_CARE_PROVIDER_SITE_OTHER): Payer: Medicare HMO | Admitting: Internal Medicine

## 2020-07-19 VITALS — BP 130/68 | HR 72 | Temp 96.5°F

## 2020-07-19 VITALS — BP 98/59 | HR 66 | Temp 97.5°F | Wt 176.0 lb

## 2020-07-19 DIAGNOSIS — Z5111 Encounter for antineoplastic chemotherapy: Secondary | ICD-10-CM | POA: Diagnosis not present

## 2020-07-19 DIAGNOSIS — Z298 Encounter for other specified prophylactic measures: Secondary | ICD-10-CM | POA: Diagnosis not present

## 2020-07-19 DIAGNOSIS — C8512 Unspecified B-cell lymphoma, intrathoracic lymph nodes: Secondary | ICD-10-CM | POA: Diagnosis not present

## 2020-07-19 DIAGNOSIS — D6481 Anemia due to antineoplastic chemotherapy: Secondary | ICD-10-CM | POA: Diagnosis not present

## 2020-07-19 DIAGNOSIS — B20 Human immunodeficiency virus [HIV] disease: Secondary | ICD-10-CM

## 2020-07-19 DIAGNOSIS — Z0000 Encounter for general adult medical examination without abnormal findings: Secondary | ICD-10-CM

## 2020-07-19 DIAGNOSIS — D709 Neutropenia, unspecified: Secondary | ICD-10-CM | POA: Diagnosis not present

## 2020-07-19 DIAGNOSIS — C8510 Unspecified B-cell lymphoma, unspecified site: Secondary | ICD-10-CM | POA: Diagnosis not present

## 2020-07-19 DIAGNOSIS — Z452 Encounter for adjustment and management of vascular access device: Secondary | ICD-10-CM | POA: Diagnosis not present

## 2020-07-19 DIAGNOSIS — Z5112 Encounter for antineoplastic immunotherapy: Secondary | ICD-10-CM | POA: Diagnosis not present

## 2020-07-19 DIAGNOSIS — Z5189 Encounter for other specified aftercare: Secondary | ICD-10-CM | POA: Diagnosis not present

## 2020-07-19 DIAGNOSIS — C8514 Unspecified B-cell lymphoma, lymph nodes of axilla and upper limb: Secondary | ICD-10-CM | POA: Diagnosis not present

## 2020-07-19 MED ORDER — PREDNISONE 50 MG PO TABS: ORAL_TABLET | ORAL | 0 refills | Status: DC

## 2020-07-19 MED ORDER — VINCRISTINE SULFATE CHEMO INJECTION 1 MG/ML
Freq: Once | INTRAVENOUS | Status: DC
  Filled 2020-07-19: qty 11

## 2020-07-19 NOTE — Assessment & Plan Note (Signed)
On chemotherapy and followed by Dr Tasia Catchings  Plan: -- continued follow up with oncology.   -- continue ART Systems developer)

## 2020-07-19 NOTE — Assessment & Plan Note (Signed)
CD4 count less than 200 in setting of HIV disease  Plan: -- continue TMP-SMX 1 DS daily for PCP prophylaxis until CD4 count greater than 200 for 3 months

## 2020-07-19 NOTE — Progress Notes (Signed)
Clarified directions of Prednisone with Dr Grayland Ormond as Dr Tasia Catchings not in office.  Pt is to start today- which is D2 and take as directed " D2-D5.  New rx will be sent in.  Janeann Merl RN  will call pt to notify.

## 2020-07-19 NOTE — Telephone Encounter (Signed)
Per [place] message from infusion nurse Hessie Knows: need advice on patient of Dr Tasia Catchings.. he is on C3 D2 of EPOCH....original Rx for Prednisone was written for Day 2-5....bu last cycle had insurance issue and could not fill to start on d2 - so we gave  decadron IV here and sent new RX once insurance resolved for prednisone days 3-5.Marland KitchenMarland KitchenMarland Kitchenjust want to clarify if just be starting today Day 2-5 of 100 mg....and states will need new rx sent to pharmacy as they only gave him 6 tabs last time  Eliazer Hemphill: Yes, he will need to take the prednisone day 2-5 with this cycle per [place] Ganja  Per last refill in computer 30 tablets were sent. Spoke to patient and he states they only gave him 6 pill (enough for days 3-5). Refill for Prednisone 50 mg (2 tablets on day 2, 3, 4 and 5 of treatment) was sent to pharmamcy. Quantity 8 tablets.

## 2020-07-19 NOTE — Telephone Encounter (Signed)
Pt scheduled for Intrathecal Methotrexate on Jan 17 at 9:30 with arrival time of 9am at [facility]. Will inform pt of this appt at next appt with Dr. Tasia Catchings.

## 2020-07-19 NOTE — Progress Notes (Signed)
[facility] for Infectious Disease   CHIEF COMPLAINT    HIV follow up.    SUBJECTIVE:    Justin Wallace is a 74 y.o. male with PMHx as below who presents to the clinic for HIV follow up.   Please see A&P for the details of today's visit and status of the patient's medical problems.   Patient's Medications  New Prescriptions   No medications on file  Previous Medications   ACYCLOVIR (ZOVIRAX) 400 MG TABLET    Take 1 tablet (400 mg total) by mouth 2 (two) times daily.   ALLOPURINOL (ZYLOPRIM) 300 MG TABLET    Take 1 tablet (300 mg total) by mouth 2 (two) times daily.   BICTEGRAVIR-EMTRICITABINE-TENOFOVIR AF (BIKTARVY) 50-200-25 MG TABS TABLET    Take 1 tablet by mouth daily.   LIDOCAINE-PRILOCAINE (EMLA) CREAM    Apply to affected area once   MAGIC MOUTHWASH W/LIDOCAINE SOLN    Take 5 mLs by mouth 4 (four) times daily as needed for mouth pain. Sig: Swish/spit 5-10 ml four times a day as needed   ONDANSETRON (ZOFRAN) 8 MG TABLET    Take 1 tablet (8 mg total) by mouth 2 (two) times daily as needed for refractory nausea / vomiting. Start on the third day after chemotherapy.   POLYETHYLENE GLYCOL POWDER (GLYCOLAX/MIRALAX) 17 GM/SCOOP POWDER    Take by mouth as needed.   PREDNISONE (DELTASONE) 50 MG TABLET    Take 2 tablets on Day 3, 4, 5 of each chemotherapy treatments.   PROCHLORPERAZINE (COMPAZINE) 10 MG TABLET    Take 1 tablet (10 mg total) by mouth every 6 (six) hours as needed (Nausea or vomiting).   SULFAMETHOXAZOLE-TRIMETHOPRIM (BACTRIM DS) 800-160 MG TABLET    Take 1 tablet by mouth daily.  Modified Medications   No medications on file  Discontinued Medications   No medications on file    Past Medical History:  Diagnosis Date  . Celiac disease   . 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 HIV (human immunodeficiency virus infection) ([place])   . Lyme disease   . Lymphoma ([place])     Family History  Problem Relation Age of Onset  . Hyperlipidemia Mother   .  Brain cancer Father     Social History   Socioeconomic History  . Marital status: Married    Spouse name: Opal Sidles   . Number of children: 2  . Years of education: Not on file  . Highest education level: Not on file  Occupational History  . Not on file  Tobacco Use  . Smoking status: Never Smoker  . Smokeless tobacco: Never Used  Vaping Use  . Vaping Use: Never used  Substance and Sexual Activity  . Alcohol use: Yes    Comment: occasional  . Drug use: Never  . Sexual activity: Never  Other Topics Concern  . Not on file  Social History Narrative  . Not on file   Social Determinants of Health   Financial Resource Strain: Not on file  Food Insecurity: Not on file  Transportation Needs: Not on file  Physical Activity: Not on file  Stress: Not on file  Social Connections: Not on file  Intimate Partner Violence: Not on file    Review of Systems  Constitutional: Positive for malaise/fatigue. Negative for chills and fever.  Respiratory: Negative.   Cardiovascular: Negative.   Gastrointestinal: Negative.      OBJECTIVE:    Vitals:   07/19/20 3300  BP: (!) 98/59  Pulse: 66  Temp: (!) 97.5 F (36.4 C)  TempSrc: Oral  Weight: 176 lb (79.8 kg)     Body mass index is 25.99 kg/m.  Physical Exam Constitutional:      General: He is not in acute distress.    Appearance: Normal appearance.  HENT:     Head: Normocephalic and atraumatic.  Eyes:     Extraocular Movements: Extraocular movements intact.     Conjunctiva/sclera: Conjunctivae normal.  Pulmonary:     Effort: Pulmonary effort is normal. No respiratory distress.  Neurological:     General: No focal deficit present.     Mental Status: He is alert and oriented to person, place, and time.  Psychiatric:        Mood and Affect: Mood normal.        Behavior: Behavior normal.     Pertinent Labs and Microbiology CMP Latest Ref Rng & Units 07/15/2020 07/08/2020 07/04/2020  Glucose 70 - 99 mg/dL 96 115(H) 80   BUN 8 - 23 mg/dL 18 20 17   Creatinine 0.61 - 1.24 mg/dL 1.10 0.78 0.89  Sodium 135 - 145 mmol/L 140 136 141  Potassium 3.5 - 5.1 mmol/L 4.2 4.3 4.3  Chloride 98 - 111 mmol/L 105 100 105  CO2 22 - 32 mmol/L 27 27 30   Calcium 8.9 - 10.3 mg/dL 8.9 8.8(L) 9.0  Total Protein 6.5 - 8.1 g/dL 6.4(L) 6.6 5.9(L)  Total Bilirubin 0.3 - 1.2 mg/dL 0.5 0.7 0.4  Alkaline Phos 38 - 126 U/L 103 117 -  AST 15 - 41 U/L 21 17 13   ALT 0 - 44 U/L 18 18 13    CBC Latest Ref Rng & Units 07/15/2020 07/12/2020 07/08/2020  WBC 4.0 - 10.5 K/uL 7.7 7.1 1.4(LL)  Hemoglobin 13.0 - 17.0 g/dL 10.5(L) 10.2(L) 11.2(L)  Hematocrit 39.0 - 52.0 % 31.2(L) 29.5(L) 33.2(L)  Platelets 150 - 400 K/uL 258 124(L) 257     Lab Results  Component Value Date   HIV1RNAQUANT 137 (H) 07/04/2020   HIV1RNAQUANT 642,000 (H) 05/05/2020   CD4TABS 108 (L) 07/04/2020   CD4TABS 64 (L) 05/05/2020    RPR and STI Lab Results  Component Value Date   LABRPR NON-REACTIVE 05/05/2020    STI Results GC CT  05/05/2020 Negative Negative    Hepatitis B Lab Results  Component Value Date   HEPBSAB NON-REACTIVE 05/05/2020   HEPBSAG NON-REACTIVE 05/05/2020   HEPBCAB NON-REACTIVE 05/05/2020   Hepatitis C Lab Results  Component Value Date   HEPCAB NON-REACTIVE 05/05/2020   Hepatitis A Lab Results  Component Value Date   HAV REACTIVE (A) 05/05/2020   Lipids: Lab Results  Component Value Date   CHOL 158 05/05/2020   TRIG 92 05/05/2020   HDL 34 (L) 05/05/2020   CHOLHDL 4.6 05/05/2020   LDLCALC 105 (H) 05/05/2020      ASSESSMENT & PLAN:    HIV disease ([place]) Justin Wallace is a 74 y.o. male with HIV infection who is doing very well on Biktarvy regimen with decreasing viral load and improving CD4 with evidence of some immune reconstitution.   Last seen by me on 05/18/20.  Started on Biktarvy at that time which he has been 100% adherent to.  Has started on chemotherapy and followed by Dr Tasia Catchings for his high grade B-cell lymphoma.  Had  labs done 07/04/20.  Also taking TMP-SMX for PCP prophylaxis and acyclovir (Rx'ed by Dr Tasia Catchings) for viral prophylaxis.    Tolerating Biktarvy without side effects  thus far.  Trying to stay active and misses being able to play badminton and pickleball.  Celebrating Christmas with his daughter and grandkids and also has been able to spend time with his wife's children.   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   -- continue ART [employer])    Orders Placed This Encounter  Procedures  . T-helper cell (CD4)- ([facility] clinic only)    Standing Status:   Future    Number of Occurrences:   1    Standing Expiration Date:   07/19/2021  . Comprehensive metabolic panel    Standing Status:   Future    Number of Occurrences:   1    Standing Expiration Date:   07/19/2021  . HIV-1 RNA quant-no reflex-bld    3 month viral load    Standing Status:   Future    Number of Occurrences:   1    Standing Expiration Date:   07/19/2021     [name] for Infectious [facility] 07/19/2020, 9:49 AM   I spent 30 minutes dedicated to the care of this patient on the date of this encounter to include pre-visit review of records, face-to-face time with the patient discussing HIV, PCP prophylaxis, lymphoma, and post-visit ordering of testing.

## 2020-07-19 NOTE — Patient Instructions (Addendum)
Thank you for coming to see me today. It was a pleasure seeing you.  Please follow-up with lab work in about 3 months and schedule an office visit a week or two after getting labs done.  If you have any questions or concerns, please do not hesitate to call the office at [phone].  Take Care,   Jule Ser, DO

## 2020-07-19 NOTE — Assessment & Plan Note (Signed)
Justin Wallace is a 74 y.o. male with HIV infection who is doing very well on Biktarvy regimen with decreasing viral load and improving CD4 with evidence of some immune reconstitution.   Last seen by me on 05/18/20.  Started on Biktarvy at that time which he has been 100% adherent to.  Has started on chemotherapy and followed by Dr Tasia Catchings for his high grade B-cell lymphoma.  Had labs done 07/04/20.  Also taking TMP-SMX for PCP prophylaxis and acyclovir (Rx'ed by Dr Tasia Catchings) for viral prophylaxis.    Tolerating Biktarvy without side effects thus far.  Trying to stay active and misses being able to play badminton and pickleball.  Celebrating Christmas with his daughter and grandkids and also has been able to spend time with his wife's children.   Plan: -- continue Biktarvy -- continue TMP-SMX -- RTC 3 months  HIV 1 RNA Quant  Date Value  07/04/2020 137 Copies/mL (H)  05/05/2020 642,000 copies/mL (H)   CD4 T Cell Abs (/uL)  Date Value  07/04/2020 108 (L)  05/05/2020 64 (L)

## 2020-07-20 ENCOUNTER — Inpatient Hospital Stay: Payer: Medicare HMO

## 2020-07-20 VITALS — BP 127/67 | HR 72 | Temp 97.5°F | Resp 18

## 2020-07-20 DIAGNOSIS — Z5189 Encounter for other specified aftercare: Secondary | ICD-10-CM | POA: Diagnosis not present

## 2020-07-20 DIAGNOSIS — Z5112 Encounter for antineoplastic immunotherapy: Secondary | ICD-10-CM | POA: Diagnosis not present

## 2020-07-20 DIAGNOSIS — Z5111 Encounter for antineoplastic chemotherapy: Secondary | ICD-10-CM | POA: Diagnosis not present

## 2020-07-20 DIAGNOSIS — B20 Human immunodeficiency virus [HIV] disease: Secondary | ICD-10-CM | POA: Diagnosis not present

## 2020-07-20 DIAGNOSIS — C8514 Unspecified B-cell lymphoma, lymph nodes of axilla and upper limb: Secondary | ICD-10-CM | POA: Diagnosis not present

## 2020-07-20 DIAGNOSIS — C8510 Unspecified B-cell lymphoma, unspecified site: Secondary | ICD-10-CM

## 2020-07-20 DIAGNOSIS — C8512 Unspecified B-cell lymphoma, intrathoracic lymph nodes: Secondary | ICD-10-CM | POA: Diagnosis not present

## 2020-07-20 DIAGNOSIS — D6481 Anemia due to antineoplastic chemotherapy: Secondary | ICD-10-CM | POA: Diagnosis not present

## 2020-07-20 DIAGNOSIS — Z452 Encounter for adjustment and management of vascular access device: Secondary | ICD-10-CM | POA: Diagnosis not present

## 2020-07-20 DIAGNOSIS — D709 Neutropenia, unspecified: Secondary | ICD-10-CM | POA: Diagnosis not present

## 2020-07-20 LAB — T-HELPER CELL (CD4) - (RCID CLINIC ONLY)
CD4 % Helper T Cell: 19 % — ABNORMAL LOW (ref 33–65)
CD4 T Cell Abs: 223 /uL — ABNORMAL LOW (ref 400–1790)

## 2020-07-20 MED ORDER — PALONOSETRON HCL INJECTION 0.25 MG/5ML
0.2500 mg | Freq: Once | INTRAVENOUS | Status: AC
  Administered 2020-07-20: 0.25 mg via INTRAVENOUS
  Filled 2020-07-20: qty 5

## 2020-07-20 MED ORDER — ETOPOSIDE CHEMO INJECTION 500 MG/25ML
Freq: Once | INTRAVENOUS | Status: DC
  Filled 2020-07-20: qty 11

## 2020-07-20 NOTE — Progress Notes (Signed)
Pt with no complaints noted today.  Pump d/c'ed and changed per policy.  Pt tolerated pump change well and left chemo infusion suite stable and ambulatory

## 2020-07-20 NOTE — Telephone Encounter (Signed)
Will get an inj encounter for 08/15/20 (no inj encounter added yet) for intrathecal Methotrexate to be done in IR on 08/15/20.

## 2020-07-21 ENCOUNTER — Other Ambulatory Visit: Payer: Self-pay

## 2020-07-21 ENCOUNTER — Inpatient Hospital Stay: Payer: Medicare HMO

## 2020-07-21 VITALS — BP 107/66 | HR 75 | Temp 98.7°F | Resp 20

## 2020-07-21 DIAGNOSIS — D709 Neutropenia, unspecified: Secondary | ICD-10-CM | POA: Diagnosis not present

## 2020-07-21 DIAGNOSIS — Z5112 Encounter for antineoplastic immunotherapy: Secondary | ICD-10-CM | POA: Diagnosis not present

## 2020-07-21 DIAGNOSIS — D6481 Anemia due to antineoplastic chemotherapy: Secondary | ICD-10-CM | POA: Diagnosis not present

## 2020-07-21 DIAGNOSIS — Z95828 Presence of other vascular implants and grafts: Secondary | ICD-10-CM

## 2020-07-21 DIAGNOSIS — B20 Human immunodeficiency virus [HIV] disease: Secondary | ICD-10-CM | POA: Diagnosis not present

## 2020-07-21 DIAGNOSIS — Z5189 Encounter for other specified aftercare: Secondary | ICD-10-CM | POA: Diagnosis not present

## 2020-07-21 DIAGNOSIS — Z452 Encounter for adjustment and management of vascular access device: Secondary | ICD-10-CM | POA: Diagnosis not present

## 2020-07-21 DIAGNOSIS — C8514 Unspecified B-cell lymphoma, lymph nodes of axilla and upper limb: Secondary | ICD-10-CM | POA: Diagnosis not present

## 2020-07-21 DIAGNOSIS — Z5111 Encounter for antineoplastic chemotherapy: Secondary | ICD-10-CM | POA: Diagnosis not present

## 2020-07-21 MED ORDER — HEPARIN SOD (PORK) LOCK FLUSH 100 UNIT/ML IV SOLN
500.0000 [IU] | Freq: Once | INTRAVENOUS | Status: AC
  Administered 2020-07-21: 500 [IU] via INTRAVENOUS
  Filled 2020-07-21: qty 5

## 2020-07-21 MED ORDER — HEPARIN SOD (PORK) LOCK FLUSH 100 UNIT/ML IV SOLN
INTRAVENOUS | Status: AC
  Filled 2020-07-21: qty 5

## 2020-07-21 MED ORDER — SODIUM CHLORIDE 0.9% FLUSH
10.0000 mL | INTRAVENOUS | Status: DC | PRN
  Administered 2020-07-21: 10 mL via INTRAVENOUS
  Filled 2020-07-21: qty 10

## 2020-07-21 NOTE — Progress Notes (Signed)
1320- Patient here for Adriamycin, Etoposide, Vincristine Pump disconnect. Patient and vital signs stable. Patient discharged to home at this time.

## 2020-07-25 ENCOUNTER — Inpatient Hospital Stay: Payer: Medicare HMO

## 2020-07-25 VITALS — BP 116/63 | HR 95 | Temp 96.5°F

## 2020-07-25 DIAGNOSIS — D6481 Anemia due to antineoplastic chemotherapy: Secondary | ICD-10-CM | POA: Diagnosis not present

## 2020-07-25 DIAGNOSIS — Z5189 Encounter for other specified aftercare: Secondary | ICD-10-CM | POA: Diagnosis not present

## 2020-07-25 DIAGNOSIS — B20 Human immunodeficiency virus [HIV] disease: Secondary | ICD-10-CM | POA: Diagnosis not present

## 2020-07-25 DIAGNOSIS — Z452 Encounter for adjustment and management of vascular access device: Secondary | ICD-10-CM | POA: Diagnosis not present

## 2020-07-25 DIAGNOSIS — C8510 Unspecified B-cell lymphoma, unspecified site: Secondary | ICD-10-CM

## 2020-07-25 DIAGNOSIS — Z5111 Encounter for antineoplastic chemotherapy: Secondary | ICD-10-CM | POA: Diagnosis not present

## 2020-07-25 DIAGNOSIS — Z5112 Encounter for antineoplastic immunotherapy: Secondary | ICD-10-CM | POA: Diagnosis not present

## 2020-07-25 DIAGNOSIS — D709 Neutropenia, unspecified: Secondary | ICD-10-CM | POA: Diagnosis not present

## 2020-07-25 DIAGNOSIS — C8512 Unspecified B-cell lymphoma, intrathoracic lymph nodes: Secondary | ICD-10-CM | POA: Diagnosis not present

## 2020-07-25 DIAGNOSIS — C8514 Unspecified B-cell lymphoma, lymph nodes of axilla and upper limb: Secondary | ICD-10-CM | POA: Diagnosis not present

## 2020-07-25 MED ORDER — PALONOSETRON HCL INJECTION 0.25 MG/5ML
0.2500 mg | Freq: Once | INTRAVENOUS | Status: AC
  Administered 2020-07-25: 0.25 mg via INTRAVENOUS

## 2020-07-25 MED ORDER — ETOPOSIDE CHEMO INJECTION 500 MG/25ML
Freq: Once | INTRAVENOUS | Status: DC
  Filled 2020-07-25: qty 11

## 2020-07-25 NOTE — Progress Notes (Signed)
Pt connected to 22 hr chemo pump. Ambulatory at d/c.

## 2020-07-25 NOTE — Telephone Encounter (Signed)
Please add injection encounter on 1/17 for medication release purposes. No need to call pt.

## 2020-07-26 ENCOUNTER — Ambulatory Visit: Payer: Medicare HMO

## 2020-07-26 ENCOUNTER — Inpatient Hospital Stay: Payer: Medicare HMO

## 2020-07-26 ENCOUNTER — Other Ambulatory Visit: Payer: Self-pay

## 2020-07-26 VITALS — BP 100/56 | HR 79 | Temp 97.0°F | Resp 16

## 2020-07-26 DIAGNOSIS — Z5111 Encounter for antineoplastic chemotherapy: Secondary | ICD-10-CM | POA: Diagnosis not present

## 2020-07-26 DIAGNOSIS — C8510 Unspecified B-cell lymphoma, unspecified site: Secondary | ICD-10-CM

## 2020-07-26 DIAGNOSIS — Z5189 Encounter for other specified aftercare: Secondary | ICD-10-CM | POA: Diagnosis not present

## 2020-07-26 DIAGNOSIS — Z452 Encounter for adjustment and management of vascular access device: Secondary | ICD-10-CM | POA: Diagnosis not present

## 2020-07-26 DIAGNOSIS — B20 Human immunodeficiency virus [HIV] disease: Secondary | ICD-10-CM | POA: Diagnosis not present

## 2020-07-26 DIAGNOSIS — C8514 Unspecified B-cell lymphoma, lymph nodes of axilla and upper limb: Secondary | ICD-10-CM | POA: Diagnosis not present

## 2020-07-26 DIAGNOSIS — D6481 Anemia due to antineoplastic chemotherapy: Secondary | ICD-10-CM | POA: Diagnosis not present

## 2020-07-26 DIAGNOSIS — Z5112 Encounter for antineoplastic immunotherapy: Secondary | ICD-10-CM | POA: Diagnosis not present

## 2020-07-26 DIAGNOSIS — D709 Neutropenia, unspecified: Secondary | ICD-10-CM | POA: Diagnosis not present

## 2020-07-26 MED ORDER — PEGFILGRASTIM 6 MG/0.6ML ~~LOC~~ PSKT: 6.0000 mg | PREFILLED_SYRINGE | Freq: Once | SUBCUTANEOUS | Status: DC

## 2020-07-26 MED ORDER — HEPARIN SOD (PORK) LOCK FLUSH 100 UNIT/ML IV SOLN
500.0000 [IU] | Freq: Once | INTRAVENOUS | Status: AC | PRN
  Administered 2020-07-26: 500 [IU]
  Filled 2020-07-26: qty 5

## 2020-07-26 MED ORDER — HEPARIN SOD (PORK) LOCK FLUSH 100 UNIT/ML IV SOLN
INTRAVENOUS | Status: AC
  Filled 2020-07-26: qty 5

## 2020-07-26 MED ORDER — SODIUM CHLORIDE 0.9 % IV SOLN
900.0000 mg/m2 | Freq: Once | INTRAVENOUS | Status: AC
  Administered 2020-07-26: 1700 mg via INTRAVENOUS
  Filled 2020-07-26: qty 85

## 2020-07-26 MED ORDER — SODIUM CHLORIDE 0.9 % IV SOLN
Freq: Once | INTRAVENOUS | Status: AC
  Filled 2020-07-26: qty 250

## 2020-07-26 NOTE — Telephone Encounter (Signed)
Done.. INJ for medication release purposes has been sched on 1/17 as requested.

## 2020-07-26 NOTE — Progress Notes (Signed)
Per Dr. Tasia Catchings, patient okay to wait until he gets home to take Prednisone for today. Per Debbora Lacrosse, pharmacy, no additional premeds needed at this time.  Patient verbalizes understanding [facility] and that he is to come to cancer center to get OnPro placed after procedure.   1500: Patient tolerated infusion well. Patient discharged home.

## 2020-07-27 ENCOUNTER — Other Ambulatory Visit: Payer: Self-pay

## 2020-07-27 ENCOUNTER — Ambulatory Visit
Admission: RE | Admit: 2020-07-27 | Discharge: 2020-07-27 | Disposition: A | Discharge status: Home / Self Care | Payer: Medicare HMO | Source: Ambulatory Visit | Attending: Oncology | Admitting: Oncology

## 2020-07-27 ENCOUNTER — Inpatient Hospital Stay: Payer: Medicare HMO

## 2020-07-27 VITALS — BP 117/66 | HR 76 | Temp 96.5°F

## 2020-07-27 DIAGNOSIS — Z5112 Encounter for antineoplastic immunotherapy: Secondary | ICD-10-CM | POA: Diagnosis not present

## 2020-07-27 DIAGNOSIS — D6481 Anemia due to antineoplastic chemotherapy: Secondary | ICD-10-CM | POA: Diagnosis not present

## 2020-07-27 DIAGNOSIS — Z452 Encounter for adjustment and management of vascular access device: Secondary | ICD-10-CM | POA: Diagnosis not present

## 2020-07-27 DIAGNOSIS — Z5189 Encounter for other specified aftercare: Secondary | ICD-10-CM | POA: Diagnosis not present

## 2020-07-27 DIAGNOSIS — D709 Neutropenia, unspecified: Secondary | ICD-10-CM | POA: Diagnosis not present

## 2020-07-27 DIAGNOSIS — C8510 Unspecified B-cell lymphoma, unspecified site: Secondary | ICD-10-CM | POA: Diagnosis not present

## 2020-07-27 DIAGNOSIS — Z5111 Encounter for antineoplastic chemotherapy: Secondary | ICD-10-CM | POA: Diagnosis not present

## 2020-07-27 DIAGNOSIS — C8514 Unspecified B-cell lymphoma, lymph nodes of axilla and upper limb: Secondary | ICD-10-CM | POA: Diagnosis not present

## 2020-07-27 DIAGNOSIS — C8330 Diffuse large B-cell lymphoma, unspecified site: Secondary | ICD-10-CM | POA: Diagnosis not present

## 2020-07-27 DIAGNOSIS — B20 Human immunodeficiency virus [HIV] disease: Secondary | ICD-10-CM | POA: Diagnosis not present

## 2020-07-27 LAB — CSF CELL COUNT WITH DIFFERENTIAL
Lymphs, CSF: 93 %
Monocyte-Macrophage-Spinal Fluid: 7 %
RBC Count, CSF: 8 /mm3 — ABNORMAL HIGH (ref 0–3)
Tube #: 1
WBC, CSF: 3 /mm3 (ref 0–5)

## 2020-07-27 LAB — CBC WITH DIFFERENTIAL/PLATELET
Abs Immature Granulocytes: 0.1 10*3/uL — ABNORMAL HIGH (ref 0.00–0.07)
Basophils Absolute: 0 10*3/uL (ref 0.0–0.1)
Basophils Relative: 0 %
Eosinophils Absolute: 0 10*3/uL (ref 0.0–0.5)
Eosinophils Relative: 0 %
HCT: 31.2 % — ABNORMAL LOW (ref 39.0–52.0)
Hemoglobin: 10.8 g/dL — ABNORMAL LOW (ref 13.0–17.0)
Immature Granulocytes: 3 %
Lymphocytes Relative: 7 %
Lymphs Abs: 0.3 10*3/uL — ABNORMAL LOW (ref 0.7–4.0)
MCH: 31.3 pg (ref 26.0–34.0)
MCHC: 34.6 g/dL (ref 30.0–36.0)
MCV: 90.4 fL (ref 80.0–100.0)
Monocytes Absolute: 0 10*3/uL — ABNORMAL LOW (ref 0.1–1.0)
Monocytes Relative: 1 %
Neutro Abs: 3.5 10*3/uL (ref 1.7–7.7)
Neutrophils Relative %: 89 %
Platelets: 296 10*3/uL (ref 150–400)
RBC: 3.45 MIL/uL — ABNORMAL LOW (ref 4.22–5.81)
RDW: 17.2 % — ABNORMAL HIGH (ref 11.5–15.5)
WBC: 3.9 10*3/uL — ABNORMAL LOW (ref 4.0–10.5)
nRBC: 0 % (ref 0.0–0.2)

## 2020-07-27 LAB — PROTIME-INR
INR: 1.1 (ref 0.8–1.2)
Prothrombin Time: 13.4 seconds (ref 11.4–15.2)

## 2020-07-27 LAB — APTT: aPTT: 24 seconds (ref 24–36)

## 2020-07-27 MED ORDER — ACETAMINOPHEN 500 MG PO TABS: 1000.0000 mg | ORAL_TABLET | Freq: Four times a day (QID) | ORAL | Status: DC | PRN

## 2020-07-27 MED ORDER — SODIUM CHLORIDE (PF) 0.9 % IJ SOLN
Freq: Once | INTRAMUSCULAR | Status: AC
  Filled 2020-07-27: qty 0.48

## 2020-07-27 MED ORDER — PEGFILGRASTIM 6 MG/0.6ML ~~LOC~~ PSKT
6.0000 mg | PREFILLED_SYRINGE | Freq: Once | SUBCUTANEOUS | Status: AC
  Administered 2020-07-27: 6 mg via SUBCUTANEOUS
  Filled 2020-07-27: qty 0.6

## 2020-08-01 ENCOUNTER — Other Ambulatory Visit: Payer: Self-pay | Admitting: *Deleted

## 2020-08-01 ENCOUNTER — Inpatient Hospital Stay: Payer: Medicare HMO | Attending: Oncology

## 2020-08-01 DIAGNOSIS — Z5112 Encounter for antineoplastic immunotherapy: Secondary | ICD-10-CM | POA: Diagnosis not present

## 2020-08-01 DIAGNOSIS — D6481 Anemia due to antineoplastic chemotherapy: Secondary | ICD-10-CM | POA: Diagnosis not present

## 2020-08-01 DIAGNOSIS — D701 Agranulocytosis secondary to cancer chemotherapy: Secondary | ICD-10-CM | POA: Insufficient documentation

## 2020-08-01 DIAGNOSIS — C8514 Unspecified B-cell lymphoma, lymph nodes of axilla and upper limb: Secondary | ICD-10-CM | POA: Diagnosis not present

## 2020-08-01 DIAGNOSIS — K1230 Oral mucositis (ulcerative), unspecified: Secondary | ICD-10-CM | POA: Diagnosis not present

## 2020-08-01 DIAGNOSIS — G620 Drug-induced polyneuropathy: Secondary | ICD-10-CM | POA: Insufficient documentation

## 2020-08-01 DIAGNOSIS — Z5111 Encounter for antineoplastic chemotherapy: Secondary | ICD-10-CM | POA: Diagnosis not present

## 2020-08-01 DIAGNOSIS — C8510 Unspecified B-cell lymphoma, unspecified site: Secondary | ICD-10-CM

## 2020-08-01 DIAGNOSIS — B20 Human immunodeficiency virus [HIV] disease: Secondary | ICD-10-CM | POA: Diagnosis not present

## 2020-08-01 DIAGNOSIS — D6959 Other secondary thrombocytopenia: Secondary | ICD-10-CM | POA: Diagnosis not present

## 2020-08-01 LAB — CBC WITH DIFFERENTIAL/PLATELET
Abs Immature Granulocytes: 0.04 10*3/uL (ref 0.00–0.07)
Basophils Absolute: 0 10*3/uL (ref 0.0–0.1)
Basophils Relative: 1 %
Eosinophils Absolute: 0.1 10*3/uL (ref 0.0–0.5)
Eosinophils Relative: 2 %
HCT: 28.3 % — ABNORMAL LOW (ref 39.0–52.0)
Hemoglobin: 9.7 g/dL — ABNORMAL LOW (ref 13.0–17.0)
Immature Granulocytes: 1 %
Lymphocytes Relative: 4 %
Lymphs Abs: 0.2 10*3/uL — ABNORMAL LOW (ref 0.7–4.0)
MCH: 31.4 pg (ref 26.0–34.0)
MCHC: 34.3 g/dL (ref 30.0–36.0)
MCV: 91.6 fL (ref 80.0–100.0)
Monocytes Absolute: 0.1 10*3/uL (ref 0.1–1.0)
Monocytes Relative: 4 %
Neutro Abs: 3.4 10*3/uL (ref 1.7–7.7)
Neutrophils Relative %: 88 %
Platelets: 92 10*3/uL — ABNORMAL LOW (ref 150–400)
RBC: 3.09 MIL/uL — ABNORMAL LOW (ref 4.22–5.81)
RDW: 16.8 % — ABNORMAL HIGH (ref 11.5–15.5)
WBC: 3.9 10*3/uL — ABNORMAL LOW (ref 4.0–10.5)
nRBC: 0 % (ref 0.0–0.2)

## 2020-08-01 LAB — COMPREHENSIVE METABOLIC PANEL
ALT: 22 U/L (ref 0–44)
AST: 14 U/L — ABNORMAL LOW (ref 15–41)
Albumin: 3.6 g/dL (ref 3.5–5.0)
Alkaline Phosphatase: 100 U/L (ref 38–126)
Anion gap: 7 (ref 5–15)
BUN: 17 mg/dL (ref 8–23)
CO2: 26 mmol/L (ref 22–32)
Calcium: 8.7 mg/dL — ABNORMAL LOW (ref 8.9–10.3)
Chloride: 103 mmol/L (ref 98–111)
Creatinine, Ser: 1.11 mg/dL (ref 0.61–1.24)
GFR, Estimated: >60 mL/min (ref >60)
Glucose, Bld: 102 mg/dL — ABNORMAL HIGH (ref 70–99)
Potassium: 3.9 mmol/L (ref 3.5–5.1)
Sodium: 136 mmol/L (ref 135–145)
Total Bilirubin: 0.5 mg/dL (ref 0.3–1.2)
Total Protein: 6.5 g/dL (ref 6.5–8.1)

## 2020-08-02 LAB — COMPREHENSIVE METABOLIC PANEL
AG Ratio: 1.5 (calc) (ref 1.0–2.5)
ALT: 11 U/L (ref 9–46)
AST: 13 U/L (ref 10–35)
Albumin: 3.7 g/dL (ref 3.6–5.1)
Alkaline phosphatase (APISO): 108 U/L (ref 35–144)
BUN: 20 mg/dL (ref 7–25)
CO2: 24 mmol/L (ref 20–32)
Calcium: 9.4 mg/dL (ref 8.6–10.3)
Chloride: 109 mmol/L (ref 98–110)
Creat: 0.78 mg/dL (ref 0.70–1.18)
Globulin: 2.4 g/dL (calc) (ref 1.9–3.7)
Glucose, Bld: 87 mg/dL (ref 65–99)
Potassium: 4.4 mmol/L (ref 3.5–5.3)
Sodium: 141 mmol/L (ref 135–146)
Total Bilirubin: 0.1 mg/dL — ABNORMAL LOW (ref 0.2–1.2)
Total Protein: 6.1 g/dL (ref 6.1–8.1)

## 2020-08-02 LAB — HIV-1 RNA QUANT-NO REFLEX-BLD
HIV 1 RNA Quant: 104 Copies/mL — ABNORMAL HIGH
HIV-1 RNA Quant, Log: 2.02 Log cps/mL — ABNORMAL HIGH

## 2020-08-03 ENCOUNTER — Other Ambulatory Visit: Payer: Self-pay

## 2020-08-03 ENCOUNTER — Inpatient Hospital Stay: Payer: Medicare HMO

## 2020-08-03 ENCOUNTER — Encounter (HOSPITAL_COMMUNITY)
Admission: RE | Admit: 2020-08-03 | Discharge: 2020-08-03 | Disposition: A | Discharge status: Home / Self Care | Payer: Medicare HMO | Source: Ambulatory Visit | Attending: Oncology | Admitting: Oncology

## 2020-08-03 DIAGNOSIS — G620 Drug-induced polyneuropathy: Secondary | ICD-10-CM | POA: Diagnosis not present

## 2020-08-03 DIAGNOSIS — Z5111 Encounter for antineoplastic chemotherapy: Secondary | ICD-10-CM | POA: Diagnosis not present

## 2020-08-03 DIAGNOSIS — D6959 Other secondary thrombocytopenia: Secondary | ICD-10-CM | POA: Diagnosis not present

## 2020-08-03 DIAGNOSIS — D6481 Anemia due to antineoplastic chemotherapy: Secondary | ICD-10-CM | POA: Diagnosis not present

## 2020-08-03 DIAGNOSIS — C8514 Unspecified B-cell lymphoma, lymph nodes of axilla and upper limb: Secondary | ICD-10-CM | POA: Diagnosis not present

## 2020-08-03 DIAGNOSIS — Z5112 Encounter for antineoplastic immunotherapy: Secondary | ICD-10-CM | POA: Diagnosis not present

## 2020-08-03 DIAGNOSIS — R222 Localized swelling, mass and lump, trunk: Secondary | ICD-10-CM | POA: Diagnosis not present

## 2020-08-03 DIAGNOSIS — C8510 Unspecified B-cell lymphoma, unspecified site: Secondary | ICD-10-CM | POA: Insufficient documentation

## 2020-08-03 DIAGNOSIS — B20 Human immunodeficiency virus [HIV] disease: Secondary | ICD-10-CM | POA: Diagnosis not present

## 2020-08-03 DIAGNOSIS — K1230 Oral mucositis (ulcerative), unspecified: Secondary | ICD-10-CM | POA: Diagnosis not present

## 2020-08-03 DIAGNOSIS — K2970 Gastritis, unspecified, without bleeding: Secondary | ICD-10-CM | POA: Diagnosis not present

## 2020-08-03 DIAGNOSIS — D701 Agranulocytosis secondary to cancer chemotherapy: Secondary | ICD-10-CM | POA: Diagnosis not present

## 2020-08-03 DIAGNOSIS — K3189 Other diseases of stomach and duodenum: Secondary | ICD-10-CM | POA: Diagnosis not present

## 2020-08-03 LAB — CBC WITH DIFFERENTIAL/PLATELET
Abs Immature Granulocytes: 1.05 10*3/uL — ABNORMAL HIGH (ref 0.00–0.07)
Basophils Absolute: 0.1 10*3/uL (ref 0.0–0.1)
Basophils Relative: 3 %
Eosinophils Absolute: 0.1 10*3/uL (ref 0.0–0.5)
Eosinophils Relative: 2 %
HCT: 28.4 % — ABNORMAL LOW (ref 39.0–52.0)
Hemoglobin: 9.6 g/dL — ABNORMAL LOW (ref 13.0–17.0)
Immature Granulocytes: 22 %
Lymphocytes Relative: 6 %
Lymphs Abs: 0.3 10*3/uL — ABNORMAL LOW (ref 0.7–4.0)
MCH: 31.1 pg (ref 26.0–34.0)
MCHC: 33.8 g/dL (ref 30.0–36.0)
MCV: 91.9 fL (ref 80.0–100.0)
Monocytes Absolute: 0.9 10*3/uL (ref 0.1–1.0)
Monocytes Relative: 20 %
Neutro Abs: 2.3 10*3/uL (ref 1.7–7.7)
Neutrophils Relative %: 47 %
Platelets: 97 10*3/uL — ABNORMAL LOW (ref 150–400)
RBC: 3.09 MIL/uL — ABNORMAL LOW (ref 4.22–5.81)
RDW: 18 % — ABNORMAL HIGH (ref 11.5–15.5)
Smear Review: NORMAL
WBC: 4.8 10*3/uL (ref 4.0–10.5)
nRBC: 2.3 % — ABNORMAL HIGH (ref 0.0–0.2)

## 2020-08-03 LAB — COMPREHENSIVE METABOLIC PANEL
ALT: 18 U/L (ref 0–44)
AST: 17 U/L (ref 15–41)
Albumin: 3.6 g/dL (ref 3.5–5.0)
Alkaline Phosphatase: 92 U/L (ref 38–126)
Anion gap: 8 (ref 5–15)
BUN: 13 mg/dL (ref 8–23)
CO2: 24 mmol/L (ref 22–32)
Calcium: 8.9 mg/dL (ref 8.9–10.3)
Chloride: 104 mmol/L (ref 98–111)
Creatinine, Ser: 1.19 mg/dL (ref 0.61–1.24)
GFR, Estimated: >60 mL/min (ref >60)
Glucose, Bld: 100 mg/dL — ABNORMAL HIGH (ref 70–99)
Potassium: 4 mmol/L (ref 3.5–5.1)
Sodium: 136 mmol/L (ref 135–145)
Total Bilirubin: 0.6 mg/dL (ref 0.3–1.2)
Total Protein: 6.3 g/dL — ABNORMAL LOW (ref 6.5–8.1)

## 2020-08-03 LAB — PATHOLOGIST SMEAR REVIEW

## 2020-08-03 LAB — GLUCOSE, CAPILLARY: Glucose-Capillary: 93 mg/dL (ref 70–99)

## 2020-08-03 MED ORDER — FLUDEOXYGLUCOSE F - 18 (FDG) INJECTION
8.7700 | Freq: Once | INTRAVENOUS | Status: AC
  Administered 2020-08-03: 8.77 via INTRAVENOUS

## 2020-08-04 ENCOUNTER — Other Ambulatory Visit: Payer: Medicare HMO

## 2020-08-06 DIAGNOSIS — C8512 Unspecified B-cell lymphoma, intrathoracic lymph nodes: Secondary | ICD-10-CM | POA: Diagnosis not present

## 2020-08-08 ENCOUNTER — Inpatient Hospital Stay: Payer: Medicare HMO

## 2020-08-08 ENCOUNTER — Inpatient Hospital Stay (HOSPITAL_BASED_OUTPATIENT_CLINIC_OR_DEPARTMENT_OTHER): Payer: Medicare HMO | Admitting: Oncology

## 2020-08-08 ENCOUNTER — Encounter: Payer: Self-pay | Admitting: Oncology

## 2020-08-08 ENCOUNTER — Telehealth: Payer: Self-pay

## 2020-08-08 VITALS — BP 115/64 | HR 85 | Temp 98.2°F | Resp 18 | Wt 170.0 lb

## 2020-08-08 DIAGNOSIS — D701 Agranulocytosis secondary to cancer chemotherapy: Secondary | ICD-10-CM

## 2020-08-08 DIAGNOSIS — C8512 Unspecified B-cell lymphoma, intrathoracic lymph nodes: Secondary | ICD-10-CM | POA: Diagnosis not present

## 2020-08-08 DIAGNOSIS — G620 Drug-induced polyneuropathy: Secondary | ICD-10-CM | POA: Diagnosis not present

## 2020-08-08 DIAGNOSIS — C8510 Unspecified B-cell lymphoma, unspecified site: Secondary | ICD-10-CM

## 2020-08-08 DIAGNOSIS — T451X5A Adverse effect of antineoplastic and immunosuppressive drugs, initial encounter: Secondary | ICD-10-CM | POA: Diagnosis not present

## 2020-08-08 DIAGNOSIS — B20 Human immunodeficiency virus [HIV] disease: Secondary | ICD-10-CM

## 2020-08-08 DIAGNOSIS — Z5111 Encounter for antineoplastic chemotherapy: Secondary | ICD-10-CM | POA: Diagnosis not present

## 2020-08-08 DIAGNOSIS — C8514 Unspecified B-cell lymphoma, lymph nodes of axilla and upper limb: Secondary | ICD-10-CM | POA: Diagnosis not present

## 2020-08-08 DIAGNOSIS — D6959 Other secondary thrombocytopenia: Secondary | ICD-10-CM | POA: Diagnosis not present

## 2020-08-08 DIAGNOSIS — D6481 Anemia due to antineoplastic chemotherapy: Secondary | ICD-10-CM | POA: Diagnosis not present

## 2020-08-08 DIAGNOSIS — K1230 Oral mucositis (ulcerative), unspecified: Secondary | ICD-10-CM | POA: Diagnosis not present

## 2020-08-08 DIAGNOSIS — Z5112 Encounter for antineoplastic immunotherapy: Secondary | ICD-10-CM | POA: Diagnosis not present

## 2020-08-08 LAB — COMPREHENSIVE METABOLIC PANEL
ALT: 15 U/L (ref 0–44)
AST: 22 U/L (ref 15–41)
Albumin: 3.5 g/dL (ref 3.5–5.0)
Alkaline Phosphatase: 103 U/L (ref 38–126)
Anion gap: 7 (ref 5–15)
BUN: 18 mg/dL (ref 8–23)
CO2: 22 mmol/L (ref 22–32)
Calcium: 8.4 mg/dL — ABNORMAL LOW (ref 8.9–10.3)
Chloride: 107 mmol/L (ref 98–111)
Creatinine, Ser: 1.19 mg/dL (ref 0.61–1.24)
GFR, Estimated: >60 mL/min (ref >60)
Glucose, Bld: 135 mg/dL — ABNORMAL HIGH (ref 70–99)
Potassium: 3.9 mmol/L (ref 3.5–5.1)
Sodium: 136 mmol/L (ref 135–145)
Total Bilirubin: 0.5 mg/dL (ref 0.3–1.2)
Total Protein: 6.6 g/dL (ref 6.5–8.1)

## 2020-08-08 LAB — CBC WITH DIFFERENTIAL/PLATELET
Abs Immature Granulocytes: 1.02 10*3/uL — ABNORMAL HIGH (ref 0.00–0.07)
Basophils Absolute: 0.1 10*3/uL (ref 0.0–0.1)
Basophils Relative: 1 %
Eosinophils Absolute: 0.1 10*3/uL (ref 0.0–0.5)
Eosinophils Relative: 1 %
HCT: 30 % — ABNORMAL LOW (ref 39.0–52.0)
Hemoglobin: 10.2 g/dL — ABNORMAL LOW (ref 13.0–17.0)
Immature Granulocytes: 11 %
Lymphocytes Relative: 14 %
Lymphs Abs: 1.2 10*3/uL (ref 0.7–4.0)
MCH: 32.2 pg (ref 26.0–34.0)
MCHC: 34 g/dL (ref 30.0–36.0)
MCV: 94.6 fL (ref 80.0–100.0)
Monocytes Absolute: 0.9 10*3/uL (ref 0.1–1.0)
Monocytes Relative: 10 %
Neutro Abs: 5.7 10*3/uL (ref 1.7–7.7)
Neutrophils Relative %: 63 %
Platelets: 220 10*3/uL (ref 150–400)
RBC: 3.17 MIL/uL — ABNORMAL LOW (ref 4.22–5.81)
RDW: 19.7 % — ABNORMAL HIGH (ref 11.5–15.5)
WBC: 9 10*3/uL (ref 4.0–10.5)
nRBC: 0.3 % — ABNORMAL HIGH (ref 0.0–0.2)

## 2020-08-08 MED ORDER — SODIUM CHLORIDE 0.9 % IV SOLN
20.0000 mg | Freq: Once | INTRAVENOUS | Status: AC
  Administered 2020-08-08: 20 mg via INTRAVENOUS
  Filled 2020-08-08: qty 20

## 2020-08-08 MED ORDER — SODIUM CHLORIDE 0.9 % IV SOLN
375.0000 mg/m2 | Freq: Once | INTRAVENOUS | Status: AC
  Administered 2020-08-08: 700 mg via INTRAVENOUS
  Filled 2020-08-08: qty 20

## 2020-08-08 MED ORDER — SODIUM CHLORIDE 0.9% FLUSH
10.0000 mL | INTRAVENOUS | Status: DC | PRN
  Administered 2020-08-08: 10 mL via INTRAVENOUS
  Filled 2020-08-08: qty 10

## 2020-08-08 MED ORDER — ACETAMINOPHEN 325 MG PO TABS
650.0000 mg | ORAL_TABLET | Freq: Once | ORAL | Status: AC
  Administered 2020-08-08: 650 mg via ORAL
  Filled 2020-08-08: qty 2

## 2020-08-08 MED ORDER — VINCRISTINE SULFATE CHEMO INJECTION 1 MG/ML: Freq: Once | INTRAVENOUS | Status: DC

## 2020-08-08 MED ORDER — PALONOSETRON HCL INJECTION 0.25 MG/5ML
0.2500 mg | Freq: Once | INTRAVENOUS | Status: AC
  Administered 2020-08-08: 0.25 mg via INTRAVENOUS
  Filled 2020-08-08: qty 5

## 2020-08-08 MED ORDER — SODIUM CHLORIDE 0.9 % IV SOLN
Freq: Once | INTRAVENOUS | Status: AC
  Filled 2020-08-08: qty 250

## 2020-08-08 MED ORDER — SODIUM CHLORIDE 0.9% FLUSH
10.0000 mL | INTRAVENOUS | Status: DC | PRN
  Filled 2020-08-08: qty 10

## 2020-08-08 MED ORDER — VINCRISTINE SULFATE CHEMO INJECTION 1 MG/ML
Freq: Once | INTRAVENOUS | Status: DC
  Filled 2020-08-08: qty 13

## 2020-08-08 MED ORDER — DIPHENHYDRAMINE HCL 25 MG PO CAPS
50.0000 mg | ORAL_CAPSULE | Freq: Once | ORAL | Status: AC
  Administered 2020-08-08: 50 mg via ORAL
  Filled 2020-08-08: qty 2

## 2020-08-08 NOTE — Progress Notes (Signed)
Stable at discharge 

## 2020-08-08 NOTE — Telephone Encounter (Signed)
Spoke with patient to relay that per Dr. Juleen China his labs continue to improve and to continue Biktarvy with scheduled follow-up in March. Patient verbalized understanding and has no further questions.   Beryle Flock, RN

## 2020-08-08 NOTE — Telephone Encounter (Signed)
-----   Message from Mignon Pine, DO sent at 08/08/2020  1:02 PM EST ----- Hi, can you pls let pt know that his labs from a couple weeks ago continue to improve (his viral load was drawn unexpectedly, but that is okay).  Continue Biktarvy and I'll see him in march.  Thanks, Mitzi Hansen

## 2020-08-08 NOTE — Telephone Encounter (Signed)
Done

## 2020-08-08 NOTE — Progress Notes (Addendum)
Hematology/Oncology follow up note [facility] Telephone:([phone] [phone]   Patient Care Team: Olin Hauser, DO as PCP - General (Family Medicine) Earlie Server, MD as Consulting Physician (Hematology and Oncology)  REFERRING PROVIDER: Nobie Putnam *  CHIEF COMPLAINTS/REASON FOR VISIT:  Follow up for axillary mass  HISTORY OF PRESENTING ILLNESS:   Justin Wallace is a  75 y.o.  male with PMH listed below was seen in consultation at the request of  Nobie Putnam *  for evaluation of symptomatic anemia Patient reported history of sudden onset of diffuse body aches, decreased appetite, headache low-grade fever, profound weakness/fatigue about 2 weeks ago.  Prior to the onset of symptoms, he went to a funeral as well as applicable clinic.  He reports that he has received COVID-19 vaccination previously.  He moved from [place].  Daughter is an Therapist, sports and told him to get COVID-19 PCR checked and he was tested negative. Patient also reports a sudden drop of weight since the onset of symptoms.  Patient has a history of celiac disease.  No colonoscopy records or pathology records in current EMR.  Diagnosis was done by [facility].  Patient reports that he was in his usual state of health until the onset of symptoms.  03/10/2020, patient was evaluated by primary care provider.  Blood work showed mild anemia with hemoglobin of 13.1, Iron panel showed decreased saturation of 8, ferritin 218, TIBC 291, patient was referred to hematology for evaluation of symptomatic anemia and iron deficiency.  Patient reports that her body aches, fever and headache symptoms have improved.  However he continues to feel very tired and fatigued.  Appetite has decreased and weight remains low.  # Physical examination showed left axillary mass  ultrasound confirmed a left axillary large soft tissue mass up to 6.8 cm with internal vascularity. 03/30/2020  CT chest with contrast showed soft tissue mass/enlarged lymph node in the left axilla 6.3 cm, concerning for primary mass or metastatic lymphadenopathy.  There are additional prominent although much smaller right axillary lymph nodes, pretracheal and superior mediastinal lymph nodes.  Findings are concerning for malignancy such as lymphoma or nodal metastatic disease.  # treated for Lymes disease, finished doxycycline 162m BID for 21 days course. 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 he has started treatment with Biktarvy, also started on Bactrim daily.  04/26/2020 PET scan showed large intensely hypermetabolic left axillary mass SUV 28.  Additional small bilateral axilla lymph node with mild metabolic activity SUV 2.  Metabolic activity through the porta hepatis region without enlarged nodes identified.  No bone lesions. 05/02/2020 bone marrow biopsy showed hypercellular marrow for age with trilineage hematopoiesis.  Increased number of CD10 positive B cells, cannot rule out minimal or subtle involvement of marrow by the high-grade B-cell lymphoma. Normal cytogenetics. 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  Positive for BCL6 and MYC expression.  FISH testing showed negative for BCL-2/BCL6/MYC gene rearrangement.   Trisomy 3 and 18 were identified and are nonspecific findings seen in clonal lymphoid neoplasm.  Final results were signed out on 05/30/2020-due to the delay of  FISH results.  B sed on the FISH results, lymphoma is best classified as high-grade B-cell lymphoma not otherwise specified.  It does not meet morphological criteria for diffuse large B-cell lymphoma nor molecular criteria for Burkitt lymphoma.  11 q. alteration testing has been sent.  Results are pending. 04/26/2020, MUGA testing showed normal  LVEF of 56% with normal LV wall motion.  05/13/2020, echocardiogram showed low normal end ejection fraction of 50 to  55%.  #04/19/2020 Mediport placed by Dr. Lucky Cowboy  INTERVAL HISTORY Justin Wallace is a 75 y.o. male who has above history reviewed by me today presents for follow up visit for high-grade B-cell lymphoma Problems and complaints are listed below: Patient finished cycle 3 dose adjusted R-EPOCH treatment.   Today patient was accompanied by his wife. Overall he tolerates chemotherapy well.  Increased fatigue. Appetite is fair.     Review of Systems  Constitutional: Positive for fatigue. Negative for appetite change, chills, fever and unexpected weight change.  HENT:   Negative for hearing loss and voice change.   Eyes: Negative for eye problems and icterus.  Respiratory: Negative for chest tightness, cough and shortness of breath.   Cardiovascular: Negative for chest pain and leg swelling.  Gastrointestinal: Negative for abdominal distention and abdominal pain.  Endocrine: Negative for hot flashes.  Genitourinary: Negative for difficulty urinating, dysuria and frequency.   Musculoskeletal: Negative for arthralgias.  Skin: Negative for itching and rash.  Neurological: Negative for light-headedness and numbness.  Hematological: Negative for adenopathy. Does not bruise/bleed easily.  Psychiatric/Behavioral: Negative for confusion.    MEDICAL HISTORY:  Past Medical History:  Diagnosis Date  . Celiac disease   . 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[place]) 05/27/2020  . HIV (human immunodeficiency virus infection) ([place])   . Lyme disease   . Lymphoma ([place])     SURGICAL HISTORY: Past Surgical History:  Procedure Laterality Date  . PORTA CATH INSERTION N/A 04/25/2020   Procedure: PORTA CATH INSERTION;  Surgeon: Algernon Huxley, MD;  Location: [facility];  Service: Cardiovascular;  Laterality: N/A;    SOCIAL HISTORY: Social History   Socioeconomic History  . Marital status: Married    Spouse name: Opal Sidles   . Number of children: 2  . Years of education: Not on file  . Highest education level: Not  on file  Occupational History  . Not on file  Tobacco Use  . Smoking status: Never Smoker  . Smokeless tobacco: Never Used  Vaping Use  . Vaping Use: Never used  Substance and Sexual Activity  . Alcohol use: Yes    Comment: occasional  . Drug use: Never  . Sexual activity: Never  Other Topics Concern  . Not on file  Social History Narrative  . Not on file   Social Determinants of Health   Financial Resource Strain: Not on file  Food Insecurity: Not on file  Transportation Needs: Not on file  Physical Activity: Not on file  Stress: Not on file  Social Connections: Not on file  Intimate Partner Violence: Not on file    FAMILY HISTORY: Family History  Problem Relation Age of Onset  . Hyperlipidemia Mother   . Brain cancer Father     ALLERGIES:  has No Known Allergies.  MEDICATIONS:  Current Outpatient Medications  Medication Sig Dispense Refill  . acyclovir (ZOVIRAX) 400 MG tablet Take 1 tablet (400 mg total) by mouth 2 (two) times daily. 60 tablet 5  . allopurinol (ZYLOPRIM) 300 MG tablet Take 1 tablet (300 mg total) by mouth 2 (two) times daily. 60 tablet 0  . bictegravir-emtricitabine-tenofovir AF (BIKTARVY) 50-200-25 MG TABS tablet Take 1 tablet by mouth daily. 30 tablet 5  . lidocaine-prilocaine (EMLA) cream Apply to affected area once 30 g 3  . magic mouthwash w/lidocaine SOLN Take 5 mLs by mouth 4 (  four) times daily as needed for mouth pain. Sig: Swish/spit 5-10 ml four times a day as needed 480 mL 1  . ondansetron (ZOFRAN) 8 MG tablet Take 1 tablet (8 mg total) by mouth 2 (two) times daily as needed for refractory nausea / vomiting. Start on the third day after chemotherapy. 30 tablet 1  . polyethylene glycol powder (GLYCOLAX/MIRALAX) 17 GM/SCOOP powder Take by mouth as needed.    . predniSONE (DELTASONE) 50 MG tablet Take 2 tablets on Day 2, 3, 4, 5 of each chemotherapy treatments. 8 tablet 0  . prochlorperazine (COMPAZINE) 10 MG tablet Take 1 tablet (10 mg  total) by mouth every 6 (six) hours as needed (Nausea or vomiting). 30 tablet 1  . sulfamethoxazole-trimethoprim (BACTRIM DS) 800-160 MG tablet Take 1 tablet by mouth daily. 30 tablet 2   No current facility-administered medications for this visit.   Facility-Administered Medications Ordered in Other Visits  Medication Dose Route Frequency Provider Last Rate Last Admin  . DOXOrubicin (ADRIAMYCIN) 26 mg, etoposide (VEPESID) 136 mg, vinCRIStine (ONCOVIN) 0.8 mg in sodium chloride 0.9 % 500 mL chemo infusion   Intravenous Once Earlie Server, MD      . methotrexate (PF) 12 mg in sodium chloride (PF) 0.9 % INTRATHECAL chemo injection   Intrathecal Once Earlie Server, MD      . methotrexate (PF) 12 mg in sodium chloride (PF) 0.9 % INTRATHECAL chemo injection   Intrathecal Once Earlie Server, MD      . sodium chloride flush (NS) 0.9 % injection 10 mL  10 mL Intravenous PRN Earlie Server, MD   10 mL at 08/08/20 1975  . sodium chloride flush (NS) 0.9 % injection 10 mL  10 mL Intracatheter PRN Earlie Server, MD         PHYSICAL EXAMINATION: ECOG PERFORMANCE STATUS: 1 - Symptomatic but completely ambulatory Vitals:   08/08/20 0846  BP: 115/64  Pulse: 85  Resp: 18  Temp: 98.2 F (36.8 C)   Filed Weights   08/08/20 0846  Weight: 170 lb (77.1 kg)    Physical Exam Constitutional:      General: He is not in acute distress. HENT:     Head: Normocephalic and atraumatic.  Eyes:     General: No scleral icterus. Cardiovascular:     Rate and Rhythm: Normal rate and regular rhythm.     Heart sounds: Normal heart sounds.  Pulmonary:     Effort: Pulmonary effort is normal. No respiratory distress.     Breath sounds: No wheezing.  Abdominal:     General: Bowel sounds are normal. There is no distension.     Palpations: Abdomen is soft.  Musculoskeletal:        General: No deformity. Normal range of motion.     Cervical back: Normal range of motion and neck supple.     Comments: Left axillary mass is no longer palpable   Skin:    General: Skin is warm and dry.     Findings: No erythema or rash.  Neurological:     Mental Status: He is alert and oriented to person, place, and time. Mental status is at baseline.     Cranial Nerves: No cranial nerve deficit.     Coordination: Coordination normal.  Psychiatric:     Comments: Emotional/anxious       LABORATORY DATA:  I have reviewed the data as listed Lab Results  Component Value Date   WBC 9.0 08/08/2020   HGB 10.2 (L) 08/08/2020  HCT 30.0 (L) 08/08/2020   MCV 94.6 08/08/2020   PLT 220 08/08/2020   Recent Labs    03/10/20 1113 03/17/20 1029 05/05/20 0956 06/06/20 0822 08/01/20 1045 08/03/20 0806 08/08/20 0818  NA 138 137 139   < > 136 136 136  K 4.8 3.8 4.4   < > 3.9 4.0 3.9  CL 106 104 107   < > 103 104 107  CO2 27 27 27    < > 26 24 22   GLUCOSE 94 97 97   < > 102* 100* 135*  BUN 23 21 19    < > 17 13 18   CREATININE 0.91 1.09 0.88   < > 1.11 1.19 1.19  CALCIUM 8.4* 8.1* 9.1   < > 8.7* 8.9 8.4*  GFRNONAA 84 >60 86   < > >60 >60 >60  GFRAA 98 >60 100  --   --   --   --   PROT 6.8 8.0 7.4   < > 6.5 6.3* 6.6  ALBUMIN  --  2.7*  --    < > 3.6 3.6 3.5  AST 20 24 15    < > 14* 17 22  ALT 13 18 8*   < > 22 18 15   ALKPHOS  --  140*  --    < > 100 92 103  BILITOT 0.4 0.3 0.5   < > 0.5 0.6 0.5   < > = values in this interval not displayed.   Iron/TIBC/Ferritin/ %Sat    Component Value Date/Time   IRON 22 (L) 03/17/2020 1029   IRON 17 02/17/2018 0000   TIBC 286 03/17/2020 1029   TIBC 425 02/17/2018 0000   FERRITIN 284 03/17/2020 1029   IRONPCTSAT 8 (L) 03/17/2020 1029   IRONPCTSAT 8 (L) 03/10/2020 1113      RADIOGRAPHIC STUDIES: I have personally reviewed the radiological images as listed and agreed with the findings in the report. NM PET Image Restage (PS) Skull Base to Thigh  Result Date: 08/03/2020 CLINICAL DATA:  Subsequent treatment strategy for high-grade lymphoma. B-cell lymphoma. EXAM: NUCLEAR MEDICINE PET SKULL BASE TO  THIGH TECHNIQUE: 8.8 mCi F-18 FDG was injected intravenously. Full-ring PET imaging was performed from the skull base to thigh after the radiotracer. CT data was obtained and used for attenuation correction and anatomic localization. Fasting blood glucose: 93 mg/dl COMPARISON:  None. FINDINGS: Mediastinal blood pool activity: SUV max 1.6 Liver activity: SUV max 2.6 NECK: No hypermetabolic lymph nodes in the neck. Incidental CT findings: none CHEST: Differing positioning of the arms compared to prior. The large LEFT axillary mass seen on comparison exam is reduced in size and metabolic activity measuring 2.0 x 1.2 cm compared to 5.0 x 2.8 cm. The activity of the mass is markedly reduced with SUV max equal 2.5 decreased from SUV max equal 28.1. The position of the axillary mass is change related to arm positioning. No additional hypermetabolic axillary nodes. No hypermetabolic mediastinal nodes. Incidental CT findings: No suspicious pulmonary nodules. Port in the anterior chest wall with tip in distal SVC. ABDOMEN/PELVIS: Spleen is increased in metabolic activity compared to prior with SUV max 4.1. Spleen is normal size and not changed from prior. Favor increased metabolic activity related to GCSF type response. No hypermetabolic abdominopelvic lymph nodes. No enlarged abdominopelvic nodes. Normal liver. There is diffuse activity within the gastric wall which is intense with SUV max equal 8.2. Incidental CT findings: None SKELETON: Diffuse uniform intense activity within the entire marrow space consistent GCSF type  response. Incidental CT findings: none IMPRESSION: 1. Marked reduction in size and metabolic activity of the LEFT axillary lymph node/mass with mild residual metabolic activity ( Deauville 3). 2. No new hypermetabolic lymph nodes on skull base to thigh scan. 3. Increased metabolic activity normal volume spleen is favored GCSF S type response. 4. Intense hypermetabolic activity throughout the stomach is  favor gastritis. 5. Uniform increase in marrow activity consistent wit GCSF type response. Electronically Signed   By: Suzy Bouchard M.D.   On: 08/03/2020 12:17   DG FLUORO GUIDED LOC OF NEEDLE/CATH TIP FOR SPINAL INJECT LT  Result Date: 07/27/2020 CLINICAL DATA:  High-grade B-cell lymphoma. Patient presents for intrathecal methotrexate injection. EXAM: DIAGNOSTIC LUMBAR PUNCTURE UNDER FLUOROSCOPIC GUIDANCE FLUOROSCOPY TIME:  Fluoroscopy Time:  0.1 minute Radiation Exposure Index (if provided by the fluoroscopic device): 0.2 mGy Number of Acquired Spot Images: 0 PROCEDURE: Informed consent was obtained from the patient prior to the procedure, including potential complications of headache, allergy, and pain. With the patient prone, the lower back was prepped with Betadine. 1% Lidocaine was used for local anesthesia. Lumbar puncture was performed at the L3-4 level using a 22 gauge needle with return of clear CSF . 3 ml of CSF were obtained for laboratory studies. 12 mg of methotrexate was injected into the subarachnoid space. The patient tolerated the procedure well and there were no apparent complications. No immediate complications. IMPRESSION: Successful fluoroscopic guided lumbar puncture for intrathecal chemotherapy. Electronically Signed   By: Kathreen Devoid   On: 07/27/2020 10:37   NM Myocar Multi W/Spect W/Wall Motion / EF  Result Date: 06/15/2020  There was no ST segment deviation noted during stress.  The study is normal.  This is a low risk study.  The left ventricular ejection fraction is low normal (51%).  There is no evidence for ischemia    NM PET Image Restage (PS) Skull Base to Thigh  Result Date: 08/03/2020 CLINICAL DATA:  Subsequent treatment strategy for high-grade lymphoma. B-cell lymphoma. EXAM: NUCLEAR MEDICINE PET SKULL BASE TO THIGH TECHNIQUE: 8.8 mCi F-18 FDG was injected intravenously. Full-ring PET imaging was performed from the skull base to thigh after the radiotracer.  CT data was obtained and used for attenuation correction and anatomic localization. Fasting blood glucose: 93 mg/dl COMPARISON:  None. FINDINGS: Mediastinal blood pool activity: SUV max 1.6 Liver activity: SUV max 2.6 NECK: No hypermetabolic lymph nodes in the neck. Incidental CT findings: none CHEST: Differing positioning of the arms compared to prior. The large LEFT axillary mass seen on comparison exam is reduced in size and metabolic activity measuring 2.0 x 1.2 cm compared to 5.0 x 2.8 cm. The activity of the mass is markedly reduced with SUV max equal 2.5 decreased from SUV max equal 28.1. The position of the axillary mass is change related to arm positioning. No additional hypermetabolic axillary nodes. No hypermetabolic mediastinal nodes. Incidental CT findings: No suspicious pulmonary nodules. Port in the anterior chest wall with tip in distal SVC. ABDOMEN/PELVIS: Spleen is increased in metabolic activity compared to prior with SUV max 4.1. Spleen is normal size and not changed from prior. Favor increased metabolic activity related to GCSF type response. No hypermetabolic abdominopelvic lymph nodes. No enlarged abdominopelvic nodes. Normal liver. There is diffuse activity within the gastric wall which is intense with SUV max equal 8.2. Incidental CT findings: None SKELETON: Diffuse uniform intense activity within the entire marrow space consistent GCSF type response. Incidental CT findings: none IMPRESSION: 1. Marked reduction in size  and metabolic activity of the LEFT axillary lymph node/mass with mild residual metabolic activity ( Deauville 3). 2. No new hypermetabolic lymph nodes on skull base to thigh scan. 3. Increased metabolic activity normal volume spleen is favored GCSF S type response. 4. Intense hypermetabolic activity throughout the stomach is favor gastritis. 5. Uniform increase in marrow activity consistent wit GCSF type response. Electronically Signed   By: Suzy Bouchard M.D.   On:  08/03/2020 12:17   ECHOCARDIOGRAM LIMITED  Result Date: 05/13/2020    ECHOCARDIOGRAM LIMITED REPORT   Patient Name:   Justin Wallace  Date of Exam: 05/13/2020 Medical Rec #:  [id number]  Height:       69.0 in Accession #:    [id number] Weight:       147.0 lb Date of Birth:  August 03, 1945  BSA:          1.813 m Patient Age:    21 years   BP:           93/63 mmHg Patient Gender: M          HR:           60 bpm. Exam Location:  [facility] Procedure: 2D Echo, Cardiac Doppler and Color Doppler Indications:     Chemotherapy evaluation V 87.41  History:         Patient has no prior history of Echocardiogram examinations. No                  heart history listed in chart.  Sonographer:     Sherrie Sport RDCS (AE) Referring Phys:  [id number] July Nickson Diagnosing Phys: Bartholome Bill MD IMPRESSIONS  1. Left ventricular ejection fraction, by estimation, is 50 to 55%. The left ventricle has low normal function. Left ventricular diastolic parameters were normal.  2. Right ventricular systolic function is normal. The right ventricular size is mildly enlarged.  3. Right atrial size was mildly dilated.  4. The mitral valve is grossly normal. Mild mitral valve regurgitation.  5. The aortic valve is tricuspid. Aortic valve regurgitation is mild. FINDINGS  Left Ventricle: Left ventricular ejection fraction, by estimation, is 50 to 55%. The left ventricle has low normal function. The left ventricular internal cavity size was normal in size. There is no left ventricular hypertrophy. Left ventricular diastolic parameters were normal. Right Ventricle: The right ventricular size is mildly enlarged. No increase in right ventricular wall thickness. Right ventricular systolic function is normal. Left Atrium: Left atrial size was normal in size. Right Atrium: Right atrial size was mildly dilated. Pericardium: There is no evidence of pericardial effusion. Mitral Valve: The mitral valve is grossly normal. Mild mitral valve regurgitation. Tricuspid Valve: The  tricuspid valve is grossly normal. Tricuspid valve regurgitation is mild. Aortic Valve: The aortic valve is tricuspid. Aortic valve regurgitation is mild. Aortic regurgitation PHT measures 499 msec. Aortic valve mean gradient measures 3.0 mmHg. Aortic valve peak gradient measures 5.1 mmHg. Aortic valve area, by VTI measures 2.56 cm. Pulmonic Valve: The pulmonic valve was not well visualized. Pulmonic valve regurgitation is mild. Aorta: The aortic root is normal in size and structure. IAS/Shunts: The interatrial septum was not assessed. LEFT VENTRICLE PLAX 2D LVIDd:         5.08 cm  Diastology LVIDs:         3.55 cm  LV e' medial:    7.07 cm/s LV PW:         1.02 cm  LV E/e' medial:  8.7 LV IVS:  0.77 cm  LV e' lateral:   4.03 cm/s LVOT diam:     2.10 cm  LV E/e' lateral: 15.2 LV SV:         56 LV SV Index:   31 LVOT Area:     3.46 cm  RIGHT VENTRICLE RV Basal diam:  3.86 cm RV S prime:     12.70 cm/s TAPSE (M-mode): 3.5 cm LEFT ATRIUM             Index       RIGHT ATRIUM           Index LA diam:        3.30 cm 1.82 cm/m  RA Area:     19.00 cm LA Vol (A2C):   52.2 ml 28.80 ml/m RA Volume:   54.60 ml  30.12 ml/m LA Vol (A4C):   22.9 ml 12.63 ml/m LA Biplane Vol: 36.4 ml 20.08 ml/m  AORTIC VALVE                   PULMONIC VALVE AV Area (Vmax):    2.03 cm    PV Vmax:        0.69 m/s AV Area (Vmean):   2.01 cm    PV Peak grad:   1.9 mmHg AV Area (VTI):     2.56 cm    RVOT Peak grad: 2 mmHg AV Vmax:           112.50 cm/s AV Vmean:          78.500 cm/s AV VTI:            0.218 m AV Peak Grad:      5.1 mmHg AV Mean Grad:      3.0 mmHg LVOT Vmax:         65.90 cm/s LVOT Vmean:        45.500 cm/s LVOT VTI:          0.161 m LVOT/AV VTI ratio: 0.74 AI PHT:            499 msec  AORTA Ao Root diam: 3.00 cm MITRAL VALVE               TRICUSPID VALVE MV Area (PHT): 3.93 cm    TR Peak grad:   28.9 mmHg MV Decel Time: 193 msec    TR Vmax:        269.00 cm/s MV E velocity: 61.30 cm/s MV A velocity: 66.80 cm/s  SHUNTS  MV E/A ratio:  0.92        Systemic VTI:  0.16 m                            Systemic Diam: 2.10 cm Bartholome Bill MD Electronically signed by Bartholome Bill MD Signature Date/Time: 05/13/2020/12:58:21 PM    Final    DG FLUORO GUIDED LOC OF NEEDLE/CATH TIP FOR SPINAL INJECT LT  Result Date: 07/27/2020 CLINICAL DATA:  High-grade B-cell lymphoma. Patient presents for intrathecal methotrexate injection. EXAM: DIAGNOSTIC LUMBAR PUNCTURE UNDER FLUOROSCOPIC GUIDANCE FLUOROSCOPY TIME:  Fluoroscopy Time:  0.1 minute Radiation Exposure Index (if provided by the fluoroscopic device): 0.2 mGy Number of Acquired Spot Images: 0 PROCEDURE: Informed consent was obtained from the patient prior to the procedure, including potential complications of headache, allergy, and pain. With the patient prone, the lower back was prepped with Betadine. 1% Lidocaine was used for local anesthesia. Lumbar puncture was performed at the L3-4  level using a 22 gauge needle with return of clear CSF . 3 ml of CSF were obtained for laboratory studies. 12 mg of methotrexate was injected into the subarachnoid space. The patient tolerated the procedure well and there were no apparent complications. No immediate complications. IMPRESSION: Successful fluoroscopic guided lumbar puncture for intrathecal chemotherapy. Electronically Signed   By: Kathreen Devoid   On: 07/27/2020 10:37   DG FLUORO GUIDED LOC OF NEEDLE/CATH TIP FOR SPINAL INJECT LT  Result Date: 07/05/2020 CLINICAL DATA:  High-grade B-cell lymphoma. EXAM: FLUOROSCOPICALLY GUIDED LUMBAR PUNCTURE FOR INTRATHECAL CHEMOTHERAPY FLUOROSCOPY TIME:  4 minutes 30 seconds fluoroscopy time. Radiation dose 69.4 mGy. PROCEDURE: Informed consent was obtained from the patient prior to the procedure, including potential complications of headache, allergy, and pain. With the patient prone, the lower back was prepped with Betadine. 1% Lidocaine was used for local anesthesia. Lumbar puncture was performed at the  L3-L4 level using a gauge needle with return of clearCSF. Confirmation of intrathecal needle tip location was performed by administering 3 cc of Omnipaque 180. 12 mg of methotrexate supplied by pharmacy. 12 mg of methotrexate was injected into the subarachnoid space. The patient tolerated the procedure well without apparent complication. IMPRESSION: Successful intrathecal methotrexate administration. Electronically Signed   By: Marcello Moores  Register   On: 07/05/2020 11:03   DG FLUORO GUIDED LOC OF NEEDLE/CATH TIP FOR SPINAL INJECT LT  Result Date: 06/14/2020 CLINICAL DATA:  High-grade B-cell lymphoma. Patient in for intrathecal methotrexate injection. EXAM: FLUOROSCOPICALLY GUIDED LUMBAR PUNCTURE FOR INTRATHECAL CHEMOTHERAPY FLUOROSCOPY TIME:  1 minutes 42 seconds fluoroscopy time. 35.3 mGy radiation dose. Four images obtained. PROCEDURE: Informed consent was obtained from the patient prior to the procedure, including potential complications of headache, allergy, pain, and CSF leak. With the patient prone, the lower back was prepped with Betadine. 1% Lidocaine was used for local anesthesia. Lumbar puncture was performed at the L3-L4 level using a gauge needle with return of clearCSF. 6 cc of CSF obtained for laboratory studies. Due to slow CSF flow 2 cc of Omnipaque 300 administered into the spinal canal to ensure intrathecal location. 12 mg of methotrexate was injected into the subarachnoid space. The patient tolerated the procedure well without apparent complication. IMPRESSION: Successful lumbar puncture for intrathecal chemotherapy. No complications. 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 Anemia due to antineoplastic chemotherapy   4. Encounter for antineoplastic chemotherapy   5. 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 Left axillary mass final pathology is high-grade B-cell lymphoma, nonspecific type.   CD 20+, MYC+, BCL2-, BCL-6+, Cyclin D1-, Ki-67 >95% Status post 3 cycles of dose adjusted EPOCH with rituximab.  Patient had +1 dose level increase for cycle 2/cycle 3. Labs are reviewed and discussed with patient.  [place] nadir is above 1. Proceed with +2 dose level R-EPOCH. Patient will have CNS prophylactic therapy with intrathecal methotrexate.  Plan treatment on 08/17/2020   Prophylaxis -Continue Bactrim  -Continue acyclovir 400 mg twice daily -Continue allopurinol 300 mg twice daily - Magic mouth wash for mucositis prophylaxis  # AIDS patient follows with ID physician.  Has been on antiretroviral therapy with Biktarvy #Neuropathy, chemotherapy related.  Grade 1.  Stable. #Possible depression/anxiety Patient reports feeling overwhelmed going through treatments.  I offered  to start him on antidepressants patient declines.   Supportive care measures are necessary for patient well-being and will be provided as necessary. We spent sufficient time to discuss many aspect of care, questions were answered to patient and wife's satisfaction.     All questions were answered. The patient knows to call the clinic with any problems questions or concerns.  cc Nobie Putnam *  Follow-up in 3 weeks for next cycle of treatmewnt.   Earlie Server, MD, PhD Hematology Oncology [facility] at [facility] Pager- [phone] 08/08/2020

## 2020-08-08 NOTE — Telephone Encounter (Signed)
Pt seen in clinic today requesting for intrathecal methotrexate to be moved form 1/17 to 1/19. Specialized scheduling contacted and pt has been r/s as requested. Per MD request, pt to have CBC rechecked on the same day as methotrexate (1/19). Pt states that he has labs drawn during procedure and wants it drawn then if possible. Conctacted Lattie Haw, Camera operator, and she will add lab (CBC) to be drawn before procedure.

## 2020-08-08 NOTE — Progress Notes (Signed)
Patient here for follow up. No new concerns voiced. Pt scheduled for intrathecal methotrexate on Monday 1/17 and would like for it to be changed to Wednesday.

## 2020-08-09 ENCOUNTER — Other Ambulatory Visit: Payer: Self-pay | Admitting: Oncology

## 2020-08-09 ENCOUNTER — Inpatient Hospital Stay: Payer: Medicare HMO

## 2020-08-09 VITALS — BP 107/67 | HR 75 | Temp 98.0°F | Resp 18

## 2020-08-09 DIAGNOSIS — B20 Human immunodeficiency virus [HIV] disease: Secondary | ICD-10-CM | POA: Diagnosis not present

## 2020-08-09 DIAGNOSIS — K1230 Oral mucositis (ulcerative), unspecified: Secondary | ICD-10-CM | POA: Diagnosis not present

## 2020-08-09 DIAGNOSIS — C8512 Unspecified B-cell lymphoma, intrathoracic lymph nodes: Secondary | ICD-10-CM | POA: Diagnosis not present

## 2020-08-09 DIAGNOSIS — C8510 Unspecified B-cell lymphoma, unspecified site: Secondary | ICD-10-CM

## 2020-08-09 DIAGNOSIS — G620 Drug-induced polyneuropathy: Secondary | ICD-10-CM | POA: Diagnosis not present

## 2020-08-09 DIAGNOSIS — D6481 Anemia due to antineoplastic chemotherapy: Secondary | ICD-10-CM | POA: Diagnosis not present

## 2020-08-09 DIAGNOSIS — D701 Agranulocytosis secondary to cancer chemotherapy: Secondary | ICD-10-CM | POA: Diagnosis not present

## 2020-08-09 DIAGNOSIS — Z5111 Encounter for antineoplastic chemotherapy: Secondary | ICD-10-CM | POA: Diagnosis not present

## 2020-08-09 DIAGNOSIS — Z5112 Encounter for antineoplastic immunotherapy: Secondary | ICD-10-CM | POA: Diagnosis not present

## 2020-08-09 DIAGNOSIS — C8514 Unspecified B-cell lymphoma, lymph nodes of axilla and upper limb: Secondary | ICD-10-CM | POA: Diagnosis not present

## 2020-08-09 DIAGNOSIS — D6959 Other secondary thrombocytopenia: Secondary | ICD-10-CM | POA: Diagnosis not present

## 2020-08-09 MED ORDER — VINCRISTINE SULFATE CHEMO INJECTION 1 MG/ML
Freq: Once | INTRAVENOUS | Status: DC
  Filled 2020-08-09: qty 13

## 2020-08-09 MED ORDER — PREDNISONE 50 MG PO TABS: ORAL_TABLET | ORAL | 2 refills | Status: DC

## 2020-08-09 MED ORDER — ETOPOSIDE CHEMO INJECTION 500 MG/25ML
Freq: Once | INTRAVENOUS | Status: DC
  Filled 2020-08-09: qty 13

## 2020-08-09 MED ORDER — SODIUM CHLORIDE 0.9% FLUSH
10.0000 mL | INTRAVENOUS | Status: DC | PRN
  Administered 2020-08-09 (×2): 10 mL
  Filled 2020-08-09: qty 10

## 2020-08-09 NOTE — Progress Notes (Signed)
1419- Patient here for Justin Wallace, Justin Wallace, Justin Wallace Continuous Infusion Pump disconnect and re-connect. Patient reports he has not taken his steroid yet today, but says he does have Prednisone at home that he can take when he gets back home. MD, Dr. Tasia Catchings, notified and aware. Per MD order: okay to proceed with Justin Wallace, Justin Wallace, Justin Wallace Continuous Infusion Pump connect at this time; instruct patient to take his Prednisone once he gets back home today. Patient informed and verbalized understanding.  1508- Patient stable and discharged to home with Justin Wallace, Justin Wallace, Justin Wallace Continuous Infusion Pump in place.

## 2020-08-10 ENCOUNTER — Inpatient Hospital Stay: Payer: Medicare HMO

## 2020-08-10 VITALS — BP 123/65 | HR 61 | Temp 97.1°F

## 2020-08-10 DIAGNOSIS — D6959 Other secondary thrombocytopenia: Secondary | ICD-10-CM | POA: Diagnosis not present

## 2020-08-10 DIAGNOSIS — Z5111 Encounter for antineoplastic chemotherapy: Secondary | ICD-10-CM | POA: Diagnosis not present

## 2020-08-10 DIAGNOSIS — D6481 Anemia due to antineoplastic chemotherapy: Secondary | ICD-10-CM | POA: Diagnosis not present

## 2020-08-10 DIAGNOSIS — B20 Human immunodeficiency virus [HIV] disease: Secondary | ICD-10-CM | POA: Diagnosis not present

## 2020-08-10 DIAGNOSIS — G620 Drug-induced polyneuropathy: Secondary | ICD-10-CM | POA: Diagnosis not present

## 2020-08-10 DIAGNOSIS — C8514 Unspecified B-cell lymphoma, lymph nodes of axilla and upper limb: Secondary | ICD-10-CM | POA: Diagnosis not present

## 2020-08-10 DIAGNOSIS — Z5112 Encounter for antineoplastic immunotherapy: Secondary | ICD-10-CM | POA: Diagnosis not present

## 2020-08-10 DIAGNOSIS — C8512 Unspecified B-cell lymphoma, intrathoracic lymph nodes: Secondary | ICD-10-CM | POA: Diagnosis not present

## 2020-08-10 DIAGNOSIS — C8510 Unspecified B-cell lymphoma, unspecified site: Secondary | ICD-10-CM

## 2020-08-10 DIAGNOSIS — D701 Agranulocytosis secondary to cancer chemotherapy: Secondary | ICD-10-CM | POA: Diagnosis not present

## 2020-08-10 DIAGNOSIS — K1230 Oral mucositis (ulcerative), unspecified: Secondary | ICD-10-CM | POA: Diagnosis not present

## 2020-08-10 MED ORDER — PALONOSETRON HCL INJECTION 0.25 MG/5ML
0.2500 mg | Freq: Once | INTRAVENOUS | Status: AC
  Administered 2020-08-10: 0.25 mg via INTRAVENOUS
  Filled 2020-08-10: qty 5

## 2020-08-10 MED ORDER — SODIUM CHLORIDE 0.9 % IV SOLN
Freq: Once | INTRAVENOUS | Status: AC
  Filled 2020-08-10: qty 250

## 2020-08-10 MED ORDER — VINCRISTINE SULFATE CHEMO INJECTION 1 MG/ML
Freq: Once | INTRAVENOUS | Status: DC
  Filled 2020-08-10: qty 13

## 2020-08-11 ENCOUNTER — Other Ambulatory Visit: Payer: Self-pay

## 2020-08-11 ENCOUNTER — Inpatient Hospital Stay: Payer: Medicare HMO

## 2020-08-11 VITALS — BP 110/56 | HR 75 | Temp 97.7°F | Resp 18

## 2020-08-11 DIAGNOSIS — D701 Agranulocytosis secondary to cancer chemotherapy: Secondary | ICD-10-CM | POA: Diagnosis not present

## 2020-08-11 DIAGNOSIS — D6481 Anemia due to antineoplastic chemotherapy: Secondary | ICD-10-CM | POA: Diagnosis not present

## 2020-08-11 DIAGNOSIS — K1230 Oral mucositis (ulcerative), unspecified: Secondary | ICD-10-CM | POA: Diagnosis not present

## 2020-08-11 DIAGNOSIS — C8510 Unspecified B-cell lymphoma, unspecified site: Secondary | ICD-10-CM

## 2020-08-11 DIAGNOSIS — Z5111 Encounter for antineoplastic chemotherapy: Secondary | ICD-10-CM | POA: Diagnosis not present

## 2020-08-11 DIAGNOSIS — B20 Human immunodeficiency virus [HIV] disease: Secondary | ICD-10-CM | POA: Diagnosis not present

## 2020-08-11 DIAGNOSIS — D6959 Other secondary thrombocytopenia: Secondary | ICD-10-CM | POA: Diagnosis not present

## 2020-08-11 DIAGNOSIS — C8514 Unspecified B-cell lymphoma, lymph nodes of axilla and upper limb: Secondary | ICD-10-CM | POA: Diagnosis not present

## 2020-08-11 DIAGNOSIS — C8512 Unspecified B-cell lymphoma, intrathoracic lymph nodes: Secondary | ICD-10-CM | POA: Diagnosis not present

## 2020-08-11 DIAGNOSIS — G620 Drug-induced polyneuropathy: Secondary | ICD-10-CM | POA: Diagnosis not present

## 2020-08-11 DIAGNOSIS — Z5112 Encounter for antineoplastic immunotherapy: Secondary | ICD-10-CM | POA: Diagnosis not present

## 2020-08-11 MED ORDER — SODIUM CHLORIDE 0.9% FLUSH
10.0000 mL | INTRAVENOUS | Status: DC | PRN
  Administered 2020-08-11: 10 mL
  Filled 2020-08-11: qty 10

## 2020-08-11 MED ORDER — VINCRISTINE SULFATE CHEMO INJECTION 1 MG/ML
Freq: Once | INTRAVENOUS | Status: DC
  Filled 2020-08-11: qty 13

## 2020-08-11 MED FILL — BIKTARVY 50-200-25 MG TABS: 50-200-25 | 30 days supply | Qty: 30 | Fill #3

## 2020-08-11 NOTE — Progress Notes (Signed)
Pt stable at discharge.

## 2020-08-12 ENCOUNTER — Telehealth: Payer: Self-pay | Admitting: *Deleted

## 2020-08-12 ENCOUNTER — Inpatient Hospital Stay: Payer: Medicare HMO

## 2020-08-12 VITALS — BP 126/65 | HR 78 | Temp 97.3°F | Resp 18

## 2020-08-12 DIAGNOSIS — K1230 Oral mucositis (ulcerative), unspecified: Secondary | ICD-10-CM | POA: Diagnosis not present

## 2020-08-12 DIAGNOSIS — G620 Drug-induced polyneuropathy: Secondary | ICD-10-CM | POA: Diagnosis not present

## 2020-08-12 DIAGNOSIS — Z5112 Encounter for antineoplastic immunotherapy: Secondary | ICD-10-CM | POA: Diagnosis not present

## 2020-08-12 DIAGNOSIS — Z5111 Encounter for antineoplastic chemotherapy: Secondary | ICD-10-CM | POA: Diagnosis not present

## 2020-08-12 DIAGNOSIS — D6959 Other secondary thrombocytopenia: Secondary | ICD-10-CM | POA: Diagnosis not present

## 2020-08-12 DIAGNOSIS — D701 Agranulocytosis secondary to cancer chemotherapy: Secondary | ICD-10-CM | POA: Diagnosis not present

## 2020-08-12 DIAGNOSIS — C8510 Unspecified B-cell lymphoma, unspecified site: Secondary | ICD-10-CM

## 2020-08-12 DIAGNOSIS — C8514 Unspecified B-cell lymphoma, lymph nodes of axilla and upper limb: Secondary | ICD-10-CM | POA: Diagnosis not present

## 2020-08-12 DIAGNOSIS — B20 Human immunodeficiency virus [HIV] disease: Secondary | ICD-10-CM | POA: Diagnosis not present

## 2020-08-12 DIAGNOSIS — D6481 Anemia due to antineoplastic chemotherapy: Secondary | ICD-10-CM | POA: Diagnosis not present

## 2020-08-12 MED ORDER — HEPARIN SOD (PORK) LOCK FLUSH 100 UNIT/ML IV SOLN
500.0000 [IU] | Freq: Once | INTRAVENOUS | Status: AC | PRN
  Administered 2020-08-12: 500 [IU]
  Filled 2020-08-12: qty 5

## 2020-08-12 MED ORDER — PALONOSETRON HCL INJECTION 0.25 MG/5ML
0.2500 mg | Freq: Once | INTRAVENOUS | Status: AC
  Administered 2020-08-12: 0.25 mg via INTRAVENOUS
  Filled 2020-08-12: qty 5

## 2020-08-12 MED ORDER — SODIUM CHLORIDE 0.9 % IV SOLN
2000.0000 mg | Freq: Once | INTRAVENOUS | Status: AC
Start: 2020-08-12 — End: 2020-08-12
  Administered 2020-08-12: 2000 mg via INTRAVENOUS
  Filled 2020-08-12: qty 100

## 2020-08-12 MED ORDER — HEPARIN SOD (PORK) LOCK FLUSH 100 UNIT/ML IV SOLN
INTRAVENOUS | Status: AC
  Filled 2020-08-12: qty 5

## 2020-08-12 MED ORDER — SODIUM CHLORIDE 0.9 % IV SOLN
Freq: Once | INTRAVENOUS | Status: AC
  Filled 2020-08-12: qty 250

## 2020-08-12 MED ORDER — PEGFILGRASTIM 6 MG/0.6ML ~~LOC~~ PSKT
6.0000 mg | PREFILLED_SYRINGE | Freq: Once | SUBCUTANEOUS | Status: AC
  Administered 2020-08-12: 6 mg via SUBCUTANEOUS
  Filled 2020-08-12: qty 0.6

## 2020-08-12 NOTE — Telephone Encounter (Signed)
Per Dr Tasia Catchings ask if he is having symptoms recommend testing covid and ask him to update his ID as well about his exposure  Per Lora Paula, RN infusion He is scheduled to get his chemo pump off and his Cytoxan today. I guess if he is not having symptoms that he can still come in   Call returned to patient who denies any symptoms. He will try to get tested and  Call his ID doctor and plans to come to appointment today wearing a good well fitting mask and will make sure to sanitize his hands

## 2020-08-12 NOTE — Telephone Encounter (Signed)
Patient called reporting that he has an appointment today at 1 for infusion and he just found out that he has been exposed to someine with [place]. He is asking if he can still come for appointment and if he needs to be tested for COVID. Please return his call

## 2020-08-13 ENCOUNTER — Other Ambulatory Visit: Payer: Self-pay

## 2020-08-13 ENCOUNTER — Other Ambulatory Visit: Payer: Medicare HMO

## 2020-08-13 DIAGNOSIS — Z20822 Contact with and (suspected) exposure to covid-19: Secondary | ICD-10-CM | POA: Diagnosis not present

## 2020-08-15 ENCOUNTER — Ambulatory Visit: Payer: Medicare HMO

## 2020-08-15 ENCOUNTER — Other Ambulatory Visit: Payer: Self-pay

## 2020-08-15 ENCOUNTER — Encounter: Payer: Self-pay | Admitting: Oncology

## 2020-08-15 ENCOUNTER — Other Ambulatory Visit: Payer: Self-pay | Admitting: *Deleted

## 2020-08-15 ENCOUNTER — Inpatient Hospital Stay: Payer: Medicare HMO

## 2020-08-15 MED ORDER — ALLOPURINOL 300 MG PO TABS: 300.0000 mg | ORAL_TABLET | Freq: Two times a day (BID) | ORAL | 0 refills | Status: DC

## 2020-08-16 LAB — NOVEL CORONAVIRUS, NAA: SARS-CoV-2, NAA: NOT DETECTED

## 2020-08-17 ENCOUNTER — Ambulatory Visit: Payer: Medicare HMO

## 2020-08-17 ENCOUNTER — Ambulatory Visit
Admission: RE | Admit: 2020-08-17 | Discharge: 2020-08-17 | Disposition: A | Discharge status: Home / Self Care | Payer: Medicare HMO | Source: Ambulatory Visit | Attending: Oncology | Admitting: Oncology

## 2020-08-17 ENCOUNTER — Inpatient Hospital Stay: Payer: Medicare HMO

## 2020-08-17 ENCOUNTER — Other Ambulatory Visit: Payer: Self-pay

## 2020-08-17 ENCOUNTER — Other Ambulatory Visit: Payer: Medicare HMO

## 2020-08-17 VITALS — BP 119/65 | HR 61 | Temp 98.6°F | Resp 17 | Ht 69.0 in | Wt 165.0 lb

## 2020-08-17 DIAGNOSIS — Z5111 Encounter for antineoplastic chemotherapy: Secondary | ICD-10-CM | POA: Diagnosis not present

## 2020-08-17 DIAGNOSIS — C8510 Unspecified B-cell lymphoma, unspecified site: Secondary | ICD-10-CM

## 2020-08-17 DIAGNOSIS — C8330 Diffuse large B-cell lymphoma, unspecified site: Secondary | ICD-10-CM | POA: Diagnosis not present

## 2020-08-17 LAB — APTT: aPTT: 28 seconds (ref 24–36)

## 2020-08-17 LAB — CBC
HCT: 26.9 % — ABNORMAL LOW (ref 39.0–52.0)
Hemoglobin: 8.6 g/dL — ABNORMAL LOW (ref 13.0–17.0)
MCH: 31.5 pg (ref 26.0–34.0)
MCHC: 32 g/dL (ref 30.0–36.0)
MCV: 98.5 fL (ref 80.0–100.0)
Platelets: 113 10*3/uL — ABNORMAL LOW (ref 150–400)
RBC: 2.73 MIL/uL — ABNORMAL LOW (ref 4.22–5.81)
RDW: 17.8 % — ABNORMAL HIGH (ref 11.5–15.5)
WBC: 1.5 10*3/uL — ABNORMAL LOW (ref 4.0–10.5)
nRBC: 0 % (ref 0.0–0.2)

## 2020-08-17 LAB — PROTIME-INR
INR: 1 (ref 0.8–1.2)
Prothrombin Time: 12.5 seconds (ref 11.4–15.2)

## 2020-08-17 MED ORDER — ACETAMINOPHEN 500 MG PO TABS
1000.0000 mg | ORAL_TABLET | Freq: Four times a day (QID) | ORAL | Status: DC | PRN
  Filled 2020-08-17: qty 2

## 2020-08-17 MED ORDER — SODIUM CHLORIDE (PF) 0.9 % IJ SOLN
Freq: Once | INTRAMUSCULAR | Status: AC
  Filled 2020-08-17: qty 0.48

## 2020-08-17 NOTE — Discharge Instructions (Signed)
Lumbar Puncture, Care After This sheet gives you information about how to care for yourself after your procedure. Your health care provider may also give you more specific instructions. If you have problems or questions, contact your health care provider. What can I expect after the procedure? After the procedure, it is common to have:  Mild discomfort or pain at the puncture site.  A mild headache that is relieved with pain medicines. Follow these instructions at home: Activity  Lie down flat or rest for as long as directed by your health care provider.  Return to your normal activities as told by your health care provider. Ask your health care provider what activities are safe for you.  Avoid lifting anything heavier than 10 lb (4.5 kg) for at least 12 hours after the procedure.  Do not drive for 24 hours if you were given a medicine to help you relax (sedative) during your procedure.  Do not drive or use heavy machinery while taking prescription pain medicine.   Puncture site care  Remove or change your bandage (dressing) as told by your health care provider.  Check your puncture area every day for signs of infection. Check for: ? More pain. ? Redness or swelling. ? Fluid or blood leaking from the puncture site. ? Warmth. ? Pus or a bad smell. General instructions  Take over-the-counter and prescription medicines only as told by your health care provider.  Drink enough fluids to keep your urine clear or pale yellow. Your health care provider may recommend drinking caffeine to prevent a headache.  Keep all follow-up visits as told by your health care provider. This is important. Contact a health care provider if:  You have fever or chills.  You have nausea or vomiting.  You have a headache that lasts for more than 2 days or does not get better with medicine. Get help right away if:  You develop any of the following in your  legs: ? Weakness. ? Numbness. ? Tingling.  You are unable to control when you urinate or have a bowel movement (incontinence).  You have signs of infection around your puncture site, such as: ? More pain. ? Redness or swelling. ? Fluid or blood leakage. ? Warmth. ? Pus or a bad smell.  You are dizzy or you feel like you might faint.  You have a severe headache, especially when you sit or stand. Summary  A lumbar puncture is a procedure in which a small needle is inserted into the lower back to remove fluid that surrounds the brain and spinal cord.  After this procedure, it is common to have a headache and pain around the needle insertion area.  Lying flat, staying hydrated, and drinking caffeine can help prevent headaches.  Monitor your needle insertion site for signs of infection, including warmth, fluid, or more pain.  Get help right away if you develop leg weakness, leg numbness, incontinence, or severe headaches. This information is not intended to replace advice given to you by your health care provider. Make sure you discuss any questions you have with your health care provider. Document Revised: 05/26/2020 Document Reviewed: 05/26/2020 Else[employer] Patient Education  2021 [employer]  

## 2020-08-17 NOTE — Progress Notes (Signed)
Patient ambulatory to the bathroom without assistance. Patient denies pain or need for anything else at this time. Continues to rest well.

## 2020-08-17 NOTE — Progress Notes (Signed)
Patient napping intermittently during recovery phase. Continues to lay flat without complaints of pain or discomfort.  Will continue to monitor.

## 2020-08-19 ENCOUNTER — Inpatient Hospital Stay: Payer: Medicare HMO

## 2020-08-19 ENCOUNTER — Other Ambulatory Visit: Payer: Self-pay | Admitting: Oncology

## 2020-08-19 ENCOUNTER — Encounter: Payer: Self-pay | Admitting: Oncology

## 2020-08-19 DIAGNOSIS — C8510 Unspecified B-cell lymphoma, unspecified site: Secondary | ICD-10-CM

## 2020-08-19 DIAGNOSIS — G620 Drug-induced polyneuropathy: Secondary | ICD-10-CM | POA: Diagnosis not present

## 2020-08-19 DIAGNOSIS — D6481 Anemia due to antineoplastic chemotherapy: Secondary | ICD-10-CM | POA: Diagnosis not present

## 2020-08-19 DIAGNOSIS — B20 Human immunodeficiency virus [HIV] disease: Secondary | ICD-10-CM | POA: Diagnosis not present

## 2020-08-19 DIAGNOSIS — Z5112 Encounter for antineoplastic immunotherapy: Secondary | ICD-10-CM | POA: Diagnosis not present

## 2020-08-19 DIAGNOSIS — Z5111 Encounter for antineoplastic chemotherapy: Secondary | ICD-10-CM | POA: Diagnosis not present

## 2020-08-19 DIAGNOSIS — C8514 Unspecified B-cell lymphoma, lymph nodes of axilla and upper limb: Secondary | ICD-10-CM | POA: Diagnosis not present

## 2020-08-19 DIAGNOSIS — D701 Agranulocytosis secondary to cancer chemotherapy: Secondary | ICD-10-CM | POA: Diagnosis not present

## 2020-08-19 DIAGNOSIS — D6959 Other secondary thrombocytopenia: Secondary | ICD-10-CM | POA: Diagnosis not present

## 2020-08-19 DIAGNOSIS — K1230 Oral mucositis (ulcerative), unspecified: Secondary | ICD-10-CM | POA: Diagnosis not present

## 2020-08-19 LAB — CBC WITH DIFFERENTIAL/PLATELET
Abs Immature Granulocytes: 0 10*3/uL (ref 0.00–0.07)
Basophils Absolute: 0 10*3/uL (ref 0.0–0.1)
Basophils Relative: 13 %
Eosinophils Absolute: 0 10*3/uL (ref 0.0–0.5)
Eosinophils Relative: 6 %
HCT: 24 % — ABNORMAL LOW (ref 39.0–52.0)
Hemoglobin: 8 g/dL — ABNORMAL LOW (ref 13.0–17.0)
Immature Granulocytes: 0 %
Lymphocytes Relative: 69 %
Lymphs Abs: 0.1 10*3/uL — ABNORMAL LOW (ref 0.7–4.0)
MCH: 32.1 pg (ref 26.0–34.0)
MCHC: 33.3 g/dL (ref 30.0–36.0)
MCV: 96.4 fL (ref 80.0–100.0)
Monocytes Absolute: 0 10*3/uL — ABNORMAL LOW (ref 0.1–1.0)
Monocytes Relative: 0 %
Neutro Abs: 0 10*3/uL — CL (ref 1.7–7.7)
Neutrophils Relative %: 12 %
Platelets: 46 10*3/uL — ABNORMAL LOW (ref 150–400)
RBC: 2.49 MIL/uL — ABNORMAL LOW (ref 4.22–5.81)
RDW: 16.5 % — ABNORMAL HIGH (ref 11.5–15.5)
Smear Review: NORMAL
WBC: 0.2 10*3/uL — CL (ref 4.0–10.5)
nRBC: 0 % (ref 0.0–0.2)

## 2020-08-19 MED ORDER — CHLORHEXIDINE GLUCONATE 0.12 % MT SOLN: 15.0000 mL | Freq: Two times a day (BID) | OROMUCOSAL | 0 refills | Status: DC

## 2020-08-19 MED ORDER — CIPROFLOXACIN HCL 500 MG PO TABS: 500.0000 mg | ORAL_TABLET | Freq: Two times a day (BID) | ORAL | 0 refills | Status: DC

## 2020-08-20 ENCOUNTER — Encounter: Payer: Self-pay | Admitting: Oncology

## 2020-08-21 ENCOUNTER — Encounter: Payer: Self-pay | Admitting: Oncology

## 2020-08-22 ENCOUNTER — Other Ambulatory Visit: Payer: Self-pay | Admitting: Oncology

## 2020-08-22 ENCOUNTER — Inpatient Hospital Stay: Payer: Medicare HMO

## 2020-08-22 ENCOUNTER — Inpatient Hospital Stay (HOSPITAL_BASED_OUTPATIENT_CLINIC_OR_DEPARTMENT_OTHER): Payer: Medicare HMO | Admitting: Oncology

## 2020-08-22 ENCOUNTER — Other Ambulatory Visit: Payer: Self-pay | Admitting: *Deleted

## 2020-08-22 ENCOUNTER — Encounter: Payer: Self-pay | Admitting: Oncology

## 2020-08-22 VITALS — BP 104/58 | HR 79 | Temp 97.9°F | Resp 16 | Wt 164.0 lb

## 2020-08-22 DIAGNOSIS — T451X5A Adverse effect of antineoplastic and immunosuppressive drugs, initial encounter: Secondary | ICD-10-CM

## 2020-08-22 DIAGNOSIS — C8510 Unspecified B-cell lymphoma, unspecified site: Secondary | ICD-10-CM

## 2020-08-22 DIAGNOSIS — D6481 Anemia due to antineoplastic chemotherapy: Secondary | ICD-10-CM | POA: Diagnosis not present

## 2020-08-22 DIAGNOSIS — D701 Agranulocytosis secondary to cancer chemotherapy: Secondary | ICD-10-CM

## 2020-08-22 DIAGNOSIS — E860 Dehydration: Secondary | ICD-10-CM

## 2020-08-22 DIAGNOSIS — K1230 Oral mucositis (ulcerative), unspecified: Secondary | ICD-10-CM | POA: Insufficient documentation

## 2020-08-22 DIAGNOSIS — D6959 Other secondary thrombocytopenia: Secondary | ICD-10-CM | POA: Diagnosis not present

## 2020-08-22 DIAGNOSIS — D649 Anemia, unspecified: Secondary | ICD-10-CM

## 2020-08-22 DIAGNOSIS — B20 Human immunodeficiency virus [HIV] disease: Secondary | ICD-10-CM

## 2020-08-22 DIAGNOSIS — G620 Drug-induced polyneuropathy: Secondary | ICD-10-CM | POA: Diagnosis not present

## 2020-08-22 DIAGNOSIS — K121 Other forms of stomatitis: Secondary | ICD-10-CM

## 2020-08-22 DIAGNOSIS — C8514 Unspecified B-cell lymphoma, lymph nodes of axilla and upper limb: Secondary | ICD-10-CM | POA: Diagnosis not present

## 2020-08-22 DIAGNOSIS — Z5111 Encounter for antineoplastic chemotherapy: Secondary | ICD-10-CM | POA: Diagnosis not present

## 2020-08-22 DIAGNOSIS — Z5112 Encounter for antineoplastic immunotherapy: Secondary | ICD-10-CM | POA: Diagnosis not present

## 2020-08-22 LAB — CBC WITH DIFFERENTIAL/PLATELET
Abs Immature Granulocytes: 0.02 10*3/uL (ref 0.00–0.07)
Basophils Absolute: 0 10*3/uL (ref 0.0–0.1)
Basophils Relative: 2 %
Eosinophils Absolute: 0 10*3/uL (ref 0.0–0.5)
Eosinophils Relative: 7 %
HCT: 20.6 % — ABNORMAL LOW (ref 39.0–52.0)
Hemoglobin: 7 g/dL — ABNORMAL LOW (ref 13.0–17.0)
Immature Granulocytes: 5 %
Lymphocytes Relative: 9 %
Lymphs Abs: 0 10*3/uL — ABNORMAL LOW (ref 0.7–4.0)
MCH: 32.1 pg (ref 26.0–34.0)
MCHC: 34 g/dL (ref 30.0–36.0)
MCV: 94.5 fL (ref 80.0–100.0)
Monocytes Absolute: 0 10*3/uL — ABNORMAL LOW (ref 0.1–1.0)
Monocytes Relative: 5 %
Neutro Abs: 0.3 10*3/uL — CL (ref 1.7–7.7)
Neutrophils Relative %: 72 %
Platelets: 24 10*3/uL — CL (ref 150–400)
RBC: 2.18 MIL/uL — ABNORMAL LOW (ref 4.22–5.81)
RDW: 15.9 % — ABNORMAL HIGH (ref 11.5–15.5)
Smear Review: DECREASED
WBC: 0.4 10*3/uL — CL (ref 4.0–10.5)
nRBC: 0 % (ref 0.0–0.2)

## 2020-08-22 LAB — COMPREHENSIVE METABOLIC PANEL
ALT: 32 U/L (ref 0–44)
AST: 22 U/L (ref 15–41)
Albumin: 3.3 g/dL — ABNORMAL LOW (ref 3.5–5.0)
Alkaline Phosphatase: 78 U/L (ref 38–126)
Anion gap: 12 (ref 5–15)
BUN: 28 mg/dL — ABNORMAL HIGH (ref 8–23)
CO2: 20 mmol/L — ABNORMAL LOW (ref 22–32)
Calcium: 8.9 mg/dL (ref 8.9–10.3)
Chloride: 103 mmol/L (ref 98–111)
Creatinine, Ser: 0.92 mg/dL (ref 0.61–1.24)
GFR, Estimated: >60 mL/min (ref >60)
Glucose, Bld: 106 mg/dL — ABNORMAL HIGH (ref 70–99)
Potassium: 3.9 mmol/L (ref 3.5–5.1)
Sodium: 135 mmol/L (ref 135–145)
Total Bilirubin: 0.5 mg/dL (ref 0.3–1.2)
Total Protein: 6.6 g/dL (ref 6.5–8.1)

## 2020-08-22 LAB — ABO/RH: ABO/RH(D): O POS

## 2020-08-22 LAB — PREPARE RBC (CROSSMATCH)

## 2020-08-22 MED ORDER — SODIUM CHLORIDE 0.9% FLUSH
10.0000 mL | INTRAVENOUS | Status: DC | PRN
  Administered 2020-08-22: 10 mL via INTRAVENOUS
  Filled 2020-08-22: qty 10

## 2020-08-22 MED ORDER — HEPARIN SOD (PORK) LOCK FLUSH 100 UNIT/ML IV SOLN
500.0000 [IU] | Freq: Once | INTRAVENOUS | Status: AC
  Administered 2020-08-22: 500 [IU] via INTRAVENOUS
  Filled 2020-08-22: qty 5

## 2020-08-22 MED ORDER — SODIUM CHLORIDE 0.9 % IV SOLN
Freq: Once | INTRAVENOUS | Status: AC
  Filled 2020-08-22: qty 250

## 2020-08-22 NOTE — Progress Notes (Signed)
Hematology/Oncology follow up note [facility] [phone] [phone]   Patient Care Team: Olin Hauser, DO as PCP - General (Family Medicine) Earlie Server, MD as Consulting Physician (Hematology and Oncology)  REFERRING PROVIDER: Nobie Putnam *  CHIEF COMPLAINTS/REASON FOR VISIT:  Follow up for axillary mass  HISTORY OF PRESENTING ILLNESS:   Justin Wallace is a  75 y.o.  male with PMH listed below was seen in consultation at the request of  Nobie Putnam *  for evaluation of symptomatic anemia Patient reported history of sudden onset of diffuse body aches, decreased appetite, headache low-grade fever, profound weakness/fatigue about 2 weeks ago.  Prior to the onset of symptoms, he went to a funeral as well as applicable clinic.  He reports that he has received COVID-19 vaccination previously.  He moved from [place].  Daughter is an Therapist, sports and told him to get COVID-19 PCR checked and he was tested negative. Patient also reports a sudden drop of weight since the onset of symptoms.  Patient has a history of celiac disease.  No colonoscopy records or pathology records in current EMR.  Diagnosis was done by [facility] gastroenterology.  Patient reports that he was in his usual state of health until the onset of symptoms.  03/10/2020, patient was evaluated by primary care provider.  Blood work showed mild anemia with hemoglobin of 13.1, Iron panel showed decreased saturation of 8, ferritin 218, TIBC 291, patient was referred to hematology for evaluation of symptomatic anemia and iron deficiency.  Patient reports that her body aches, fever and headache symptoms have improved.  However he continues to feel very tired and fatigued.  Appetite has decreased and weight remains low.  # Physical examination showed left axillary mass  ultrasound confirmed a left axillary large soft tissue mass up to 6.8 cm with internal vascularity. 03/30/2020  CT chest with contrast showed soft tissue mass/enlarged lymph node in the left axilla 6.3 cm, concerning for primary mass or metastatic lymphadenopathy.  There are additional prominent although much smaller right axillary lymph nodes, pretracheal and superior mediastinal lymph nodes.  Findings are concerning for malignancy such as lymphoma or nodal metastatic disease.  # treated for Lymes disease, finished doxycycline 174m BID for 21 days course. 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 he has started treatment with Biktarvy, also started on Bactrim daily.  04/26/2020 PET scan showed large intensely hypermetabolic left axillary mass SUV 28.  Additional small bilateral axilla lymph node with mild metabolic activity SUV 2.  Metabolic activity through the porta hepatis region without enlarged nodes identified.  No bone lesions. 05/02/2020 bone marrow biopsy showed hypercellular marrow for age with trilineage hematopoiesis.  Increased number of CD10 positive B cells, cannot rule out minimal or subtle involvement of marrow by the high-grade B-cell lymphoma. Normal cytogenetics. 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  Positive for BCL6 and MYC expression.  FISH testing showed negative for BCL-2/BCL6/MYC gene rearrangement.   Trisomy 3 and 18 were identified and are nonspecific findings seen in clonal lymphoid neoplasm.  Final results were signed out on 05/30/2020-due to the delay of  FISH results.  B sed on the FISH results, lymphoma is best classified as high-grade B-cell lymphoma not otherwise specified.  It does not meet morphological criteria for diffuse large B-cell lymphoma nor molecular criteria for Burkitt lymphoma.  11 q. alteration testing has been sent.  Results are pending. 04/26/2020, MUGA testing showed normal  LVEF of 56% with normal LV wall motion.  05/13/2020, echocardiogram showed low normal end ejection fraction of 50 to  55%.  #04/19/2020 Mediport placed by Dr. Lucky Cowboy  INTERVAL HISTORY Justin Wallace is a 75 y.o. male who has above history reviewed by me today presents for follow up visit for high-grade B-cell lymphoma and management of chemotherapy associated complications. Problems and complaints are listed below: On dose adjusted R-EPOCH treatment.   Patient reports feeling weak since last chemotherapy.  Mouth sore, painful with eating and chewing.  Decreased oral intake, unable to do liquids, Jell-O, mashed potato etc.  No fever or chills. He is taking Cipro 500 mg twice daily.  Also Magic mouthwash. When he blows his nose, there is very small amount of blood.  No other active bleeding events.  Painful hemorrhoids, nonbleeding No fever chills, nausea vomiting diarrhea.  Review of Systems  Constitutional: Positive for fatigue. Negative for appetite change, chills, fever and unexpected weight change.  HENT:   Positive for mouth sores. Negative for hearing loss and voice change.   Eyes: Negative for eye problems and icterus.  Respiratory: Negative for chest tightness, cough and shortness of breath.   Cardiovascular: Negative for chest pain and leg swelling.  Gastrointestinal: Negative for abdominal distention and abdominal pain.  Endocrine: Negative for hot flashes.  Genitourinary: Negative for difficulty urinating, dysuria and frequency.   Musculoskeletal: Negative for arthralgias.  Skin: Negative for itching and rash.  Neurological: Negative for light-headedness and numbness.  Hematological: Negative for adenopathy. Does not bruise/bleed easily.  Psychiatric/Behavioral: Negative for confusion.    MEDICAL HISTORY:  Past Medical History:  Diagnosis Date  . Celiac disease   . 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[place]) 05/27/2020  . HIV (human immunodeficiency virus infection) ([place])   . Lyme disease   . Lymphoma ([place])     SURGICAL HISTORY: Past Surgical History:  Procedure Laterality Date  . PORTA CATH INSERTION  N/A 04/25/2020   Procedure: PORTA CATH INSERTION;  Surgeon: Algernon Huxley, MD;  Location: [facility];  Service: Cardiovascular;  Laterality: N/A;    SOCIAL HISTORY: Social History   Socioeconomic History  . Marital status: Married    Spouse name: Opal Sidles   . Number of children: 2  . Years of education: Not on file  . Highest education level: Not on file  Occupational History  . Not on file  Tobacco Use  . Smoking status: Never Smoker  . Smokeless tobacco: Never Used  Vaping Use  . Vaping Use: Never used  Substance and Sexual Activity  . Alcohol use: Yes    Comment: occasional  . Drug use: Never  . Sexual activity: Never  Other Topics Concern  . Not on file  Social History Narrative  . Not on file   Social Determinants of Health   Financial Resource Strain: Not on file  Food Insecurity: Not on file  Transportation Needs: Not on file  Physical Activity: Not on file  Stress: Not on file  Social Connections: Not on file  Intimate Partner Violence: Not on file    FAMILY HISTORY: Family History  Problem Relation Age of Onset  . Hyperlipidemia Mother   . Brain cancer Father     ALLERGIES:  has No Known Allergies.  MEDICATIONS:  Current Outpatient Medications  Medication Sig Dispense Refill  . acyclovir (ZOVIRAX) 400 MG tablet Take 1 tablet (400 mg total) by mouth 2 (two) times daily. 60 tablet 5  . allopurinol (ZYLOPRIM) 300 MG tablet  Take 1 tablet (300 mg total) by mouth 2 (two) times daily. 180 tablet 0  . bictegravir-emtricitabine-tenofovir AF (BIKTARVY) 50-200-25 MG TABS tablet Take 1 tablet by mouth daily. 30 tablet 5  . chlorhexidine (PERIDEX) 0.12 % solution Use as directed 15 mLs in the mouth or throat 2 (two) times daily. 473 mL 0  . ciprofloxacin (CIPRO) 500 MG tablet Take 1 tablet (500 mg total) by mouth 2 (two) times daily. 30 tablet 0  . lidocaine-prilocaine (EMLA) cream Apply to affected area once 30 g 3  . magic mouthwash w/lidocaine SOLN Take 5  mLs by mouth 4 (four) times daily as needed for mouth pain. Sig: Swish/spit 5-10 ml four times a day as needed 480 mL 1  . ondansetron (ZOFRAN) 8 MG tablet Take 1 tablet (8 mg total) by mouth 2 (two) times daily as needed for refractory nausea / vomiting. Start on the third day after chemotherapy. 30 tablet 1  . polyethylene glycol powder (GLYCOLAX/MIRALAX) 17 GM/SCOOP powder Take by mouth as needed.    . predniSONE (DELTASONE) 50 MG tablet Take 2 tablets on Day 2, 3, 4, 5 of each chemotherapy treatments. 8 tablet 2  . prochlorperazine (COMPAZINE) 10 MG tablet Take 1 tablet (10 mg total) by mouth every 6 (six) hours as needed (Nausea or vomiting). 30 tablet 1  . sulfamethoxazole-trimethoprim (BACTRIM DS) 800-160 MG tablet Take 1 tablet by mouth daily. 30 tablet 2   No current facility-administered medications for this visit.   Facility-Administered Medications Ordered in Other Visits  Medication Dose Route Frequency Provider Last Rate Last Admin  . methotrexate (PF) 12 mg in sodium chloride (PF) 0.9 % INTRATHECAL chemo injection   Intrathecal Once Earlie Server, MD      . methotrexate (PF) 12 mg in sodium chloride (PF) 0.9 % INTRATHECAL chemo injection   Intrathecal Once Earlie Server, MD      . sodium chloride flush (NS) 0.9 % injection 10 mL  10 mL Intravenous PRN Earlie Server, MD   10 mL at 08/22/20 1102     PHYSICAL EXAMINATION: ECOG PERFORMANCE STATUS: 1 - Symptomatic but completely ambulatory Vitals:   08/22/20 1112  BP: (!) 104/58  Pulse: 79  Resp: 16  Temp: 97.9 F (36.6 C)   Filed Weights   08/22/20 1112  Weight: 164 lb (74.4 kg)    Physical Exam Constitutional:      General: He is not in acute distress. HENT:     Head: Normocephalic and atraumatic.     Mouth/Throat:     Comments: Stomatitis Eyes:     General: No scleral icterus. Cardiovascular:     Rate and Rhythm: Normal rate and regular rhythm.     Heart sounds: Normal heart sounds.  Pulmonary:     Effort: Pulmonary effort  is normal. No respiratory distress.     Breath sounds: No wheezing.  Abdominal:     General: Bowel sounds are normal. There is no distension.     Palpations: Abdomen is soft.  Musculoskeletal:        General: No deformity. Normal range of motion.     Cervical back: Normal range of motion and neck supple.     Comments: Left axillary mass is no longer palpable  Skin:    General: Skin is warm and dry.     Findings: No erythema or rash.  Neurological:     Mental Status: He is alert and oriented to person, place, and time. Mental status is at baseline.  Cranial Nerves: No cranial nerve deficit.     Coordination: Coordination normal.  Psychiatric:        Mood and Affect: Mood normal.       LABORATORY DATA:  I have reviewed the data as listed Lab Results  Component Value Date   WBC 0.4 (LL) 08/22/2020   HGB 7.0 (L) 08/22/2020   HCT 20.6 (L) 08/22/2020   MCV 94.5 08/22/2020   PLT 24 (LL) 08/22/2020   Recent Labs    03/10/20 1113 03/17/20 1029 05/05/20 0956 06/06/20 0822 08/03/20 0806 08/08/20 0818 08/22/20 1102  NA 138 137 139   < > 136 136 135  K 4.8 3.8 4.4   < > 4.0 3.9 3.9  CL 106 104 107   < > 104 107 103  CO2 27 27 27    < > 24 22 20*  GLUCOSE 94 97 97   < > 100* 135* 106*  BUN 23 21 19    < > 13 18 28*  CREATININE 0.91 1.09 0.88   < > 1.19 1.19 0.92  CALCIUM 8.4* 8.1* 9.1   < > 8.9 8.4* 8.9  GFRNONAA 84 >60 86   < > >60 >60 >60  GFRAA 98 >60 100  --   --   --   --   PROT 6.8 8.0 7.4   < > 6.3* 6.6 6.6  ALBUMIN  --  2.7*  --    < > 3.6 3.5 3.3*  AST 20 24 15    < > 17 22 22   ALT 13 18 8*   < > 18 15 32  ALKPHOS  --  140*  --    < > 92 103 78  BILITOT 0.4 0.3 0.5   < > 0.6 0.5 0.5   < > = values in this interval not displayed.   Iron/TIBC/Ferritin/ %Sat    Component Value Date/Time   IRON 22 (L) 03/17/2020 1029   IRON 17 02/17/2018 0000   TIBC 286 03/17/2020 1029   TIBC 425 02/17/2018 0000   FERRITIN 284 03/17/2020 1029   IRONPCTSAT 8 (L) 03/17/2020  1029   IRONPCTSAT 8 (L) 03/10/2020 1113      RADIOGRAPHIC STUDIES: I have personally reviewed the radiological images as listed and agreed with the findings in the report. NM PET Image Restage (PS) Skull Base to Thigh  Result Date: 08/03/2020 CLINICAL DATA:  Subsequent treatment strategy for high-grade lymphoma. B-cell lymphoma. EXAM: NUCLEAR MEDICINE PET SKULL BASE TO THIGH TECHNIQUE: 8.8 mCi F-18 FDG was injected intravenously. Full-ring PET imaging was performed from the skull base to thigh after the radiotracer. CT data was obtained and used for attenuation correction and anatomic localization. Fasting blood glucose: 93 mg/dl COMPARISON:  None. FINDINGS: Mediastinal blood pool activity: SUV max 1.6 Liver activity: SUV max 2.6 NECK: No hypermetabolic lymph nodes in the neck. Incidental CT findings: none CHEST: Differing positioning of the arms compared to prior. The large LEFT axillary mass seen on comparison exam is reduced in size and metabolic activity measuring 2.0 x 1.2 cm compared to 5.0 x 2.8 cm. The activity of the mass is markedly reduced with SUV max equal 2.5 decreased from SUV max equal 28.1. The position of the axillary mass is change related to arm positioning. No additional hypermetabolic axillary nodes. No hypermetabolic mediastinal nodes. Incidental CT findings: No suspicious pulmonary nodules. Port in the anterior chest wall with tip in distal SVC. ABDOMEN/PELVIS: Spleen is increased in metabolic activity compared to prior with SUV  max 4.1. Spleen is normal size and not changed from prior. Favor increased metabolic activity related to GCSF type response. No hypermetabolic abdominopelvic lymph nodes. No enlarged abdominopelvic nodes. Normal liver. There is diffuse activity within the gastric wall which is intense with SUV max equal 8.2. Incidental CT findings: None SKELETON: Diffuse uniform intense activity within the entire marrow space consistent GCSF type response. Incidental CT  findings: none IMPRESSION: 1. Marked reduction in size and metabolic activity of the LEFT axillary lymph node/mass with mild residual metabolic activity ( Deauville 3). 2. No new hypermetabolic lymph nodes on skull base to thigh scan. 3. Increased metabolic activity normal volume spleen is favored GCSF S type response. 4. Intense hypermetabolic activity throughout the stomach is favor gastritis. 5. Uniform increase in marrow activity consistent wit GCSF type response. Electronically Signed   By: Suzy Bouchard M.D.   On: 08/03/2020 12:17   DG FLUORO GUIDED LOC OF NEEDLE/CATH TIP FOR SPINAL INJECT LT  Result Date: 08/17/2020 CLINICAL DATA:  High-grade B-cell lymphoma, patient presents for intrathecal methotrexate EXAM: DIAGNOSTIC LUMBAR PUNCTURE UNDER FLUOROSCOPIC GUIDANCE FLUOROSCOPY TIME:  Fluoroscopy Time:  0.1 minute Radiation Exposure Index (if provided by the fluoroscopic device): 0.1 mGy Number of Acquired Spot Images: 0 PROCEDURE: Informed consent was obtained from the patient prior to the procedure, including potential complications of headache, allergy, and pain. With the patient prone, the lower back was prepped with Betadine. 1% Lidocaine was used for local anesthesia. Lumbar puncture was performed at the L3-4 level using a 22 gauge needle with return of clear CSF. 12 mg of methotrexate was injected into the subarachnoid space. The patient tolerated the procedure well and there were no apparent complications. No immediate complications. IMPRESSION: Successful fluoroscopic guided lumbar puncture for intrathecal chemotherapy. Electronically Signed   By: Kathreen Devoid   On: 08/17/2020 11:23   DG FLUORO GUIDED LOC OF NEEDLE/CATH TIP FOR SPINAL INJECT LT  Result Date: 07/27/2020 CLINICAL DATA:  High-grade B-cell lymphoma. Patient presents for intrathecal methotrexate injection. EXAM: DIAGNOSTIC LUMBAR PUNCTURE UNDER FLUOROSCOPIC GUIDANCE FLUOROSCOPY TIME:  Fluoroscopy Time:  0.1 minute Radiation  Exposure Index (if provided by the fluoroscopic device): 0.2 mGy Number of Acquired Spot Images: 0 PROCEDURE: Informed consent was obtained from the patient prior to the procedure, including potential complications of headache, allergy, and pain. With the patient prone, the lower back was prepped with Betadine. 1% Lidocaine was used for local anesthesia. Lumbar puncture was performed at the L3-4 level using a 22 gauge needle with return of clear CSF . 3 ml of CSF were obtained for laboratory studies. 12 mg of methotrexate was injected into the subarachnoid space. The patient tolerated the procedure well and there were no apparent complications. No immediate complications. IMPRESSION: Successful fluoroscopic guided lumbar puncture for intrathecal chemotherapy. Electronically Signed   By: Kathreen Devoid   On: 07/27/2020 10:37   NM Myocar Multi W/Spect W/Wall Motion / EF  Result Date: 06/15/2020  There was no ST segment deviation noted during stress.  The study is normal.  This is a low risk study.  The left ventricular ejection fraction is low normal (51%).  There is no evidence for ischemia    NM PET Image Restage (PS) Skull Base to Thigh  Result Date: 08/03/2020 CLINICAL DATA:  Subsequent treatment strategy for high-grade lymphoma. B-cell lymphoma. EXAM: NUCLEAR MEDICINE PET SKULL BASE TO THIGH TECHNIQUE: 8.8 mCi F-18 FDG was injected intravenously. Full-ring PET imaging was performed from the skull base to thigh after the  radiotracer. CT data was obtained and used for attenuation correction and anatomic localization. Fasting blood glucose: 93 mg/dl COMPARISON:  None. FINDINGS: Mediastinal blood pool activity: SUV max 1.6 Liver activity: SUV max 2.6 NECK: No hypermetabolic lymph nodes in the neck. Incidental CT findings: none CHEST: Differing positioning of the arms compared to prior. The large LEFT axillary mass seen on comparison exam is reduced in size and metabolic activity measuring 2.0 x 1.2 cm  compared to 5.0 x 2.8 cm. The activity of the mass is markedly reduced with SUV max equal 2.5 decreased from SUV max equal 28.1. The position of the axillary mass is change related to arm positioning. No additional hypermetabolic axillary nodes. No hypermetabolic mediastinal nodes. Incidental CT findings: No suspicious pulmonary nodules. Port in the anterior chest wall with tip in distal SVC. ABDOMEN/PELVIS: Spleen is increased in metabolic activity compared to prior with SUV max 4.1. Spleen is normal size and not changed from prior. Favor increased metabolic activity related to GCSF type response. No hypermetabolic abdominopelvic lymph nodes. No enlarged abdominopelvic nodes. Normal liver. There is diffuse activity within the gastric wall which is intense with SUV max equal 8.2. Incidental CT findings: None SKELETON: Diffuse uniform intense activity within the entire marrow space consistent GCSF type response. Incidental CT findings: none IMPRESSION: 1. Marked reduction in size and metabolic activity of the LEFT axillary lymph node/mass with mild residual metabolic activity ( Deauville 3). 2. No new hypermetabolic lymph nodes on skull base to thigh scan. 3. Increased metabolic activity normal volume spleen is favored GCSF S type response. 4. Intense hypermetabolic activity throughout the stomach is favor gastritis. 5. Uniform increase in marrow activity consistent wit GCSF type response. Electronically Signed   By: Suzy Bouchard M.D.   On: 08/03/2020 12:17   DG FLUORO GUIDED LOC OF NEEDLE/CATH TIP FOR SPINAL INJECT LT  Result Date: 08/17/2020 CLINICAL DATA:  High-grade B-cell lymphoma, patient presents for intrathecal methotrexate EXAM: DIAGNOSTIC LUMBAR PUNCTURE UNDER FLUOROSCOPIC GUIDANCE FLUOROSCOPY TIME:  Fluoroscopy Time:  0.1 minute Radiation Exposure Index (if provided by the fluoroscopic device): 0.1 mGy Number of Acquired Spot Images: 0 PROCEDURE: Informed consent was obtained from the patient prior  to the procedure, including potential complications of headache, allergy, and pain. With the patient prone, the lower back was prepped with Betadine. 1% Lidocaine was used for local anesthesia. Lumbar puncture was performed at the L3-4 level using a 22 gauge needle with return of clear CSF. 12 mg of methotrexate was injected into the subarachnoid space. The patient tolerated the procedure well and there were no apparent complications. No immediate complications. IMPRESSION: Successful fluoroscopic guided lumbar puncture for intrathecal chemotherapy. Electronically Signed   By: Kathreen Devoid   On: 08/17/2020 11:23   DG FLUORO GUIDED LOC OF NEEDLE/CATH TIP FOR SPINAL INJECT LT  Result Date: 07/27/2020 CLINICAL DATA:  High-grade B-cell lymphoma. Patient presents for intrathecal methotrexate injection. EXAM: DIAGNOSTIC LUMBAR PUNCTURE UNDER FLUOROSCOPIC GUIDANCE FLUOROSCOPY TIME:  Fluoroscopy Time:  0.1 minute Radiation Exposure Index (if provided by the fluoroscopic device): 0.2 mGy Number of Acquired Spot Images: 0 PROCEDURE: Informed consent was obtained from the patient prior to the procedure, including potential complications of headache, allergy, and pain. With the patient prone, the lower back was prepped with Betadine. 1% Lidocaine was used for local anesthesia. Lumbar puncture was performed at the L3-4 level using a 22 gauge needle with return of clear CSF . 3 ml of CSF were obtained for laboratory studies. 12 mg of methotrexate  was injected into the subarachnoid space. The patient tolerated the procedure well and there were no apparent complications. No immediate complications. IMPRESSION: Successful fluoroscopic guided lumbar puncture for intrathecal chemotherapy. Electronically Signed   By: Kathreen Devoid   On: 07/27/2020 10:37   DG FLUORO GUIDED LOC OF NEEDLE/CATH TIP FOR SPINAL INJECT LT  Result Date: 07/05/2020 CLINICAL DATA:  High-grade B-cell lymphoma. EXAM: FLUOROSCOPICALLY GUIDED LUMBAR PUNCTURE  FOR INTRATHECAL CHEMOTHERAPY FLUOROSCOPY TIME:  4 minutes 30 seconds fluoroscopy time. Radiation dose 69.4 mGy. PROCEDURE: Informed consent was obtained from the patient prior to the procedure, including potential complications of headache, allergy, and pain. With the patient prone, the lower back was prepped with Betadine. 1% Lidocaine was used for local anesthesia. Lumbar puncture was performed at the L3-L4 level using a gauge needle with return of clearCSF. Confirmation of intrathecal needle tip location was performed by administering 3 cc of Omnipaque 180. 12 mg of methotrexate supplied by pharmacy. 12 mg of methotrexate was injected into the subarachnoid space. The patient tolerated the procedure well without apparent complication. IMPRESSION: Successful intrathecal methotrexate administration. Electronically Signed   By: Marcello Moores  Register   On: 07/05/2020 11:03   DG FLUORO GUIDED LOC OF NEEDLE/CATH TIP FOR SPINAL INJECT LT  Result Date: 06/14/2020 CLINICAL DATA:  High-grade B-cell lymphoma. Patient in for intrathecal methotrexate injection. EXAM: FLUOROSCOPICALLY GUIDED LUMBAR PUNCTURE FOR INTRATHECAL CHEMOTHERAPY FLUOROSCOPY TIME:  1 minutes 42 seconds fluoroscopy time. 35.3 mGy radiation dose. Four images obtained. PROCEDURE: Informed consent was obtained from the patient prior to the procedure, including potential complications of headache, allergy, pain, and CSF leak. With the patient prone, the lower back was prepped with Betadine. 1% Lidocaine was used for local anesthesia. Lumbar puncture was performed at the L3-L4 level using a gauge needle with return of clearCSF. 6 cc of CSF obtained for laboratory studies. Due to slow CSF flow 2 cc of Omnipaque 300 administered into the spinal canal to ensure intrathecal location. 12 mg of methotrexate was injected into the subarachnoid space. The patient tolerated the procedure well without apparent complication. IMPRESSION: Successful lumbar puncture for  intrathecal chemotherapy. No complications. Electronically Signed   By: Marcello Moores  Register   On: 06/14/2020 13:05      ASSESSMENT & PLAN:  1. 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[place])   2. AIDS (acquired immune deficiency syndrome) ([place])   3. Anemia due to antineoplastic chemotherapy   4. 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 Stomatitis and mucositis   7. Symptomatic anemia    #Stage III, possible stage IV high-grade B-cell lymphoma-subtle bone marrow involvement. Left axillary mass final pathology is high-grade B-cell lymphoma, nonspecific type.   CD 20+, MYC+, BCL2-, BCL-6+, Cyclin D1-, Ki-67 >95% Status post 4 cycles of dose adjusted EPOCH with rituximab.  Patient had +1 dose level increase for cycle 2/cycle 3, +2 dose level increase for cycle 4 Patient did not tolerate cycle 4 treatment well. Labs are reviewed and discussed with patient  Chemotherapy-induced neutropenia, ANC 0.3, slightly improved compared to last week.  He received G-CSF support during his cycle 4 treatment. Continue Cipro 500 mg twice daily for prophylactic coverage.  Neutropenia precaution was discussed with patient.  Avoid rectal temperature or examination.  #Chemotherapy-induced anemia, symptomatic anemia, hemoglobin is 7 today Patient feels very fatigued and weak. Recommend 1 unit of irradiated PRBC transfusion.  Rationale of blood transfusion and potential side effects were reviewed in details with patient and his wife.  Patient agrees with the plan.  #Chemotherapy-induced thrombocytopenia, no significant active bleeding events.  Small amount of blood when he blows his nose.  We will watch platelet count closely.  Plan to transfuse if platelet count is less than 10,000 or if significant active bleeding.  #Mucositis/ stomatitis, continue Magic mouthwash Decreased oral intake due to mouth sores, patient will receive IV normal saline 1 L x 1.  Repeat CBC  08/25/2020.   Prophylaxis -Continue Bactrim every other day for PCP prophylaxis -Continue acyclovir 400 mg twice daily -Continue allopurinol 300 mg twice daily - Magic mouth wash for mucositis prophylaxis  # AIDS patient follows with ID physician.  Has been on antiretroviral therapy with Biktarvy #Neuropathy, chemotherapy related.  Grade 1.  Stable. .   Supportive care measures are necessary for patient well-being and will be provided as necessary. We spent sufficient time to discuss many aspect of care, questions were answered to patient and wife's satisfaction.     All questions were answered. The patient knows to call the clinic with any problems questions or concerns.  cc Nobie Putnam *  Follow-up in 1 week  Earlie Server, MD, PhD Hematology Oncology [facility] Pager- [phone] 08/22/2020

## 2020-08-22 NOTE — Progress Notes (Signed)
Patient here today with concerns of increased weakness/fatigue since his last treatment.  The mouth sores are not improving and need to eat soft foods.  Does drink 1-2 meal replacements a day.

## 2020-08-22 NOTE — Telephone Encounter (Signed)
Done.. Pts wife Opal Sidles was made aware of pts sched 1/24 appt

## 2020-08-22 NOTE — Progress Notes (Signed)
Pt weak, fatigued and anemic. Received IV hydration. Blood bracelet placed on pt with instructions to not remove this bracelet. He must have it on to receive blood transfusion tomorrow. I also wrote this on his AVS. Pt requested his port needle be removed even though he is coming back tomorrow. Ambulatory at discharge. Denies any dizziness.

## 2020-08-23 ENCOUNTER — Ambulatory Visit (INDEPENDENT_AMBULATORY_CARE_PROVIDER_SITE_OTHER): Payer: Medicare HMO | Admitting: Family Medicine

## 2020-08-23 ENCOUNTER — Inpatient Hospital Stay: Payer: Medicare HMO

## 2020-08-23 ENCOUNTER — Other Ambulatory Visit: Payer: Self-pay

## 2020-08-23 ENCOUNTER — Encounter: Payer: Self-pay | Admitting: Family Medicine

## 2020-08-23 VITALS — BP 101/48 | HR 84 | Ht 69.0 in | Wt 165.2 lb

## 2020-08-23 DIAGNOSIS — K1230 Oral mucositis (ulcerative), unspecified: Secondary | ICD-10-CM | POA: Diagnosis not present

## 2020-08-23 DIAGNOSIS — Z5112 Encounter for antineoplastic immunotherapy: Secondary | ICD-10-CM | POA: Diagnosis not present

## 2020-08-23 DIAGNOSIS — K644 Residual hemorrhoidal skin tags: Secondary | ICD-10-CM

## 2020-08-23 DIAGNOSIS — Z5111 Encounter for antineoplastic chemotherapy: Secondary | ICD-10-CM | POA: Diagnosis not present

## 2020-08-23 DIAGNOSIS — C8514 Unspecified B-cell lymphoma, lymph nodes of axilla and upper limb: Secondary | ICD-10-CM | POA: Diagnosis not present

## 2020-08-23 DIAGNOSIS — D701 Agranulocytosis secondary to cancer chemotherapy: Secondary | ICD-10-CM | POA: Diagnosis not present

## 2020-08-23 DIAGNOSIS — D6959 Other secondary thrombocytopenia: Secondary | ICD-10-CM | POA: Diagnosis not present

## 2020-08-23 DIAGNOSIS — B20 Human immunodeficiency virus [HIV] disease: Secondary | ICD-10-CM | POA: Diagnosis not present

## 2020-08-23 DIAGNOSIS — G620 Drug-induced polyneuropathy: Secondary | ICD-10-CM | POA: Diagnosis not present

## 2020-08-23 DIAGNOSIS — D6481 Anemia due to antineoplastic chemotherapy: Secondary | ICD-10-CM | POA: Diagnosis not present

## 2020-08-23 DIAGNOSIS — D649 Anemia, unspecified: Secondary | ICD-10-CM

## 2020-08-23 MED ORDER — SODIUM CHLORIDE 0.9% IV SOLUTION
250.0000 mL | Freq: Once | INTRAVENOUS | Status: AC
  Administered 2020-08-23: 250 mL via INTRAVENOUS
  Filled 2020-08-23: qty 250

## 2020-08-23 MED ORDER — SODIUM CHLORIDE 0.9% FLUSH
10.0000 mL | INTRAVENOUS | Status: AC | PRN
  Administered 2020-08-23: 10 mL
  Filled 2020-08-23: qty 10

## 2020-08-23 MED ORDER — ACETAMINOPHEN 325 MG PO TABS
650.0000 mg | ORAL_TABLET | Freq: Once | ORAL | Status: AC
  Administered 2020-08-23: 650 mg via ORAL
  Filled 2020-08-23: qty 2

## 2020-08-23 MED ORDER — DIPHENHYDRAMINE HCL 25 MG PO CAPS
25.0000 mg | ORAL_CAPSULE | Freq: Once | ORAL | Status: AC
  Administered 2020-08-23: 25 mg via ORAL
  Filled 2020-08-23: qty 1

## 2020-08-23 MED ORDER — HEPARIN SOD (PORK) LOCK FLUSH 100 UNIT/ML IV SOLN
500.0000 [IU] | Freq: Every day | INTRAVENOUS | Status: AC | PRN
  Administered 2020-08-23: 500 [IU]
  Filled 2020-08-23: qty 5

## 2020-08-23 MED ORDER — HEPARIN SOD (PORK) LOCK FLUSH 100 UNIT/ML IV SOLN
INTRAVENOUS | Status: AC
  Filled 2020-08-23: qty 5

## 2020-08-23 MED ORDER — HYDROCORTISONE (PERIANAL) 2.5 % EX CREA: 1.0000 "application " | TOPICAL_CREAM | Freq: Two times a day (BID) | CUTANEOUS | 2 refills | Status: DC

## 2020-08-23 NOTE — Patient Instructions (Addendum)
Thank you for coming to the office today.  You have an inflamed external hemorrhoid, which involves swollen veins on your rectum, it is very sensitive and causes your severe pain. You may experience worsening pain and bleeding with bright red blood if the hemorrhoid develops a superficial blood clot. Also you may have deeper internal hemorrhoids that can cause bleeding without as much pain. - Start using the Anusol cream twice a day as prescribed for 1 week, given a refill if needed for flare up - Use Goodrx coupon if not covered - continue the warm bathtub soak 1-2 times daily for next week if you can, or can try the [facility] for just your bottom - Try to stay well hydrated, avoid constipation and straining, eat a high fiber diet  Use Miralax regularly.  If you get significant worsening pain, rectal bleeding, or not responding to treatment, please notify our office and we will anticipate on an urgent referral to General Surgery office   Please schedule a Follow-up Appointment to: Return if symptoms worsen or fail to improve.  If you have any other questions or concerns, please feel free to call the office or send a message through [place]. You may also schedule an earlier appointment if necessary.  Additionally, you may be receiving a survey about your experience at our office within a few days to 1 week by e-mail or mail. We value your feedback.  Nobie Putnam, DO [place]

## 2020-08-23 NOTE — Progress Notes (Signed)
Pt received 1u of pRBCs in clinic today. Tolerated well. VSS @ d/c.

## 2020-08-23 NOTE — Progress Notes (Signed)
Subjective:    Patient ID: Justin Wallace, male    DOB: 01-Feb-1946, 75 y.o.   MRN: [id number]  Justin Wallace is a 75 y.o. male presenting on 08/23/2020 for Hemorrhoids   HPI   Hemorrhoids, external Reports onset within past >3 weeks with rectal pain from hemorrhoids, he is not endorsing significant bleeding with hemorrhoids. Primarily rectal pain, 3 out of 10 mild to moderate constant rectal pain with increasing severity with prolong sitting in wrong spot or movement. Significant pain worse with bowel movements.  He had initial chemotherapy in recent weeks/months and he had secondary constipation since that time. He had flare of hemorrhoids since that time. He has improved bowel   Admits constipation Denies bleeding, rash, redness, fever chills, nausea vomiting diarrhea   Depression screen [facility] 2/9 05/18/2020 03/10/2020  Decreased Interest 0 0  Down, Depressed, Hopeless 0 0  PHQ - 2 Score 0 0    Social History   Tobacco Use  . Smoking status: Never Smoker  . Smokeless tobacco: Never Used  Vaping Use  . Vaping Use: Never used  Substance Use Topics  . Alcohol use: Yes    Comment: occasional  . Drug use: Never    Review of Systems Per HPI unless specifically indicated above     Objective:    BP (!) 101/48   Pulse 84   Ht 5' 9"  (1.753 m)   Wt 165 lb 3.2 oz (74.9 kg)   SpO2 100%   BMI 24.40 kg/m   Wt Readings from Last 3 Encounters:  08/23/20 165 lb 3.2 oz (74.9 kg)  08/22/20 164 lb (74.4 kg)  08/17/20 165 lb (74.8 kg)    Physical Exam Vitals and nursing note reviewed.  Constitutional:      General: He is not in acute distress.    Appearance: He is well-developed and well-nourished. He is not diaphoretic.     Comments: Well-appearing, comfortable, cooperative  HENT:     Head: Normocephalic and atraumatic.     Mouth/Throat:     Mouth: Oropharynx is clear and moist.  Eyes:     General:        Right eye: No discharge.        Left eye: No discharge.      Conjunctiva/sclera: Conjunctivae normal.  Cardiovascular:     Rate and Rhythm: Normal rate.  Pulmonary:     Effort: Pulmonary effort is normal.  Genitourinary:    Comments: Rectal Exam externally with large swollen external hemorrhoid with excess skin tag. Does not appear to be thrombosed. No bleeding. No anal fissure. Notable amount of topical cream on the area. Deferred DRE internal exam today. Musculoskeletal:        General: No edema.  Skin:    General: Skin is warm and dry.     Findings: No erythema or rash.  Neurological:     Mental Status: He is alert and oriented to person, place, and time.  Psychiatric:        Mood and Affect: Mood and affect normal.        Behavior: Behavior normal.     Comments: Well groomed, good eye contact, normal speech and thoughts    Results for orders placed or performed in visit on 08/22/20  Prepare RBC (crossmatch)  Result Value Ref Range   Order Confirmation      ORDER PROCESSED BY BLOOD BANK Performed at [facility], [address], [place], [place] [address]  Assessment & Plan:   Problem List Items Addressed This Visit   None   Visit Diagnoses    Inflamed external hemorrhoid    -  Primary   Relevant Medications   hydrocortisone (ANUSOL-HC) 2.5 % rectal cream      Acute inflamed external hemorrhoid. Does not appear thrombosed, without active bleeding.  Unable to appreciate internal hemorrhoids. No evidence of anal fissure or any perineal problems, no sign of erythema or cellulitis. - Inadequate conservative therapy   Plan: 1. Start rx Anusol-HC topical cream rx - goodrx coupon given, can use BID 1-2 weeks then PRN 2. Start Sitz Baths or warm bathtub soaks to help resolve flare 3. Avoid constipation and straining, recommend high fiber diet, improve hydration - use Miralax regularly 4. May take NSAID PRN 5. Caution with prolonged sitting 6. Reviewed return criteria if not improving, also advised that if  significant worsening given location and size of hemorrhoidal tissue, may need General Surgery evaluation and management   Meds ordered this encounter  Medications  . hydrocortisone (ANUSOL-HC) 2.5 % rectal cream    Sig: Place 1 application rectally 2 (two) times daily. For up to 1-2 weeks. As needed hemorrhoids    Dispense:  30 g    Refill:  2      Follow up plan: Return if symptoms worsen or fail to improve.    Nobie Putnam, [facility] 08/23/2020, 8:25 AM

## 2020-08-24 LAB — BPAM RBC
Blood Product Expiration Date: 202202182359
ISSUE DATE / TIME: 202201250931
Unit Type and Rh: 5100

## 2020-08-24 LAB — TYPE AND SCREEN
ABO/RH(D): O POS
Antibody Screen: NEGATIVE
Unit division: 0

## 2020-08-25 ENCOUNTER — Inpatient Hospital Stay: Payer: Medicare HMO

## 2020-08-25 ENCOUNTER — Telehealth: Payer: Self-pay

## 2020-08-25 DIAGNOSIS — Z5112 Encounter for antineoplastic immunotherapy: Secondary | ICD-10-CM | POA: Diagnosis not present

## 2020-08-25 DIAGNOSIS — B20 Human immunodeficiency virus [HIV] disease: Secondary | ICD-10-CM | POA: Diagnosis not present

## 2020-08-25 DIAGNOSIS — C8514 Unspecified B-cell lymphoma, lymph nodes of axilla and upper limb: Secondary | ICD-10-CM | POA: Diagnosis not present

## 2020-08-25 DIAGNOSIS — D6959 Other secondary thrombocytopenia: Secondary | ICD-10-CM | POA: Diagnosis not present

## 2020-08-25 DIAGNOSIS — Z5111 Encounter for antineoplastic chemotherapy: Secondary | ICD-10-CM | POA: Diagnosis not present

## 2020-08-25 DIAGNOSIS — K1230 Oral mucositis (ulcerative), unspecified: Secondary | ICD-10-CM | POA: Diagnosis not present

## 2020-08-25 DIAGNOSIS — D6481 Anemia due to antineoplastic chemotherapy: Secondary | ICD-10-CM | POA: Diagnosis not present

## 2020-08-25 DIAGNOSIS — G620 Drug-induced polyneuropathy: Secondary | ICD-10-CM | POA: Diagnosis not present

## 2020-08-25 DIAGNOSIS — C8510 Unspecified B-cell lymphoma, unspecified site: Secondary | ICD-10-CM

## 2020-08-25 DIAGNOSIS — D701 Agranulocytosis secondary to cancer chemotherapy: Secondary | ICD-10-CM | POA: Diagnosis not present

## 2020-08-25 LAB — CBC WITH DIFFERENTIAL/PLATELET
Abs Immature Granulocytes: 0.41 10*3/uL — ABNORMAL HIGH (ref 0.00–0.07)
Basophils Absolute: 0 10*3/uL (ref 0.0–0.1)
Basophils Relative: 2 %
Eosinophils Absolute: 0.1 10*3/uL (ref 0.0–0.5)
Eosinophils Relative: 4 %
HCT: 25.4 % — ABNORMAL LOW (ref 39.0–52.0)
Hemoglobin: 8.6 g/dL — ABNORMAL LOW (ref 13.0–17.0)
Immature Granulocytes: 25 %
Lymphocytes Relative: 10 %
Lymphs Abs: 0.2 10*3/uL — ABNORMAL LOW (ref 0.7–4.0)
MCH: 32.6 pg (ref 26.0–34.0)
MCHC: 33.9 g/dL (ref 30.0–36.0)
MCV: 96.2 fL (ref 80.0–100.0)
Monocytes Absolute: 0.5 10*3/uL (ref 0.1–1.0)
Monocytes Relative: 28 %
Neutro Abs: 0.5 10*3/uL — ABNORMAL LOW (ref 1.7–7.7)
Neutrophils Relative %: 31 %
Platelets: 47 10*3/uL — ABNORMAL LOW (ref 150–400)
RBC: 2.64 MIL/uL — ABNORMAL LOW (ref 4.22–5.81)
RDW: 15.4 % (ref 11.5–15.5)
Smear Review: NORMAL
WBC: 1.7 10*3/uL — ABNORMAL LOW (ref 4.0–10.5)
nRBC: 4.8 % — ABNORMAL HIGH (ref 0.0–0.2)

## 2020-08-25 LAB — COMPREHENSIVE METABOLIC PANEL
ALT: 22 U/L (ref 0–44)
AST: 17 U/L (ref 15–41)
Albumin: 3.4 g/dL — ABNORMAL LOW (ref 3.5–5.0)
Alkaline Phosphatase: 90 U/L (ref 38–126)
Anion gap: 8 (ref 5–15)
BUN: 18 mg/dL (ref 8–23)
CO2: 26 mmol/L (ref 22–32)
Calcium: 9 mg/dL (ref 8.9–10.3)
Chloride: 103 mmol/L (ref 98–111)
Creatinine, Ser: 1.04 mg/dL (ref 0.61–1.24)
GFR, Estimated: >60 mL/min (ref >60)
Glucose, Bld: 95 mg/dL (ref 70–99)
Potassium: 4 mmol/L (ref 3.5–5.1)
Sodium: 137 mmol/L (ref 135–145)
Total Bilirubin: 0.5 mg/dL (ref 0.3–1.2)
Total Protein: 6.7 g/dL (ref 6.5–8.1)

## 2020-08-25 LAB — SAMPLE TO BLOOD BANK

## 2020-08-25 NOTE — Telephone Encounter (Signed)
Done.. Pt 08/26/20 appts has been cx per MD request.

## 2020-08-25 NOTE — Telephone Encounter (Signed)
-----   Message from Earlie Server, MD sent at 08/25/2020  1:05 PM EST ----- Counts are better, cancel blood and platelet transfusion tmr.  Advise him to take Cipro 500mg  BID for 2 more days and stop.

## 2020-08-25 NOTE — Telephone Encounter (Signed)
Patient notified that blood and platelet transfusion will be cancelled for tomorrow and advised of antibiotic stop date.

## 2020-08-26 ENCOUNTER — Inpatient Hospital Stay: Payer: Medicare HMO

## 2020-08-29 ENCOUNTER — Inpatient Hospital Stay: Payer: Medicare HMO

## 2020-08-29 ENCOUNTER — Inpatient Hospital Stay (HOSPITAL_BASED_OUTPATIENT_CLINIC_OR_DEPARTMENT_OTHER): Payer: Medicare HMO | Admitting: Oncology

## 2020-08-29 ENCOUNTER — Other Ambulatory Visit: Payer: Self-pay

## 2020-08-29 ENCOUNTER — Encounter: Payer: Self-pay | Admitting: Oncology

## 2020-08-29 VITALS — BP 96/59 | HR 67 | Temp 96.0°F | Resp 18

## 2020-08-29 VITALS — BP 108/62 | HR 67 | Temp 97.4°F | Resp 18 | Wt 172.5 lb

## 2020-08-29 DIAGNOSIS — K121 Other forms of stomatitis: Secondary | ICD-10-CM

## 2020-08-29 DIAGNOSIS — B20 Human immunodeficiency virus [HIV] disease: Secondary | ICD-10-CM | POA: Diagnosis not present

## 2020-08-29 DIAGNOSIS — D701 Agranulocytosis secondary to cancer chemotherapy: Secondary | ICD-10-CM | POA: Diagnosis not present

## 2020-08-29 DIAGNOSIS — C8512 Unspecified B-cell lymphoma, intrathoracic lymph nodes: Secondary | ICD-10-CM | POA: Diagnosis not present

## 2020-08-29 DIAGNOSIS — T451X5A Adverse effect of antineoplastic and immunosuppressive drugs, initial encounter: Secondary | ICD-10-CM

## 2020-08-29 DIAGNOSIS — K1230 Oral mucositis (ulcerative), unspecified: Secondary | ICD-10-CM

## 2020-08-29 DIAGNOSIS — C8510 Unspecified B-cell lymphoma, unspecified site: Secondary | ICD-10-CM

## 2020-08-29 DIAGNOSIS — G620 Drug-induced polyneuropathy: Secondary | ICD-10-CM | POA: Diagnosis not present

## 2020-08-29 DIAGNOSIS — Z5111 Encounter for antineoplastic chemotherapy: Secondary | ICD-10-CM | POA: Diagnosis not present

## 2020-08-29 DIAGNOSIS — D6959 Other secondary thrombocytopenia: Secondary | ICD-10-CM | POA: Diagnosis not present

## 2020-08-29 DIAGNOSIS — D6481 Anemia due to antineoplastic chemotherapy: Secondary | ICD-10-CM

## 2020-08-29 DIAGNOSIS — Z5112 Encounter for antineoplastic immunotherapy: Secondary | ICD-10-CM | POA: Diagnosis not present

## 2020-08-29 DIAGNOSIS — C8514 Unspecified B-cell lymphoma, lymph nodes of axilla and upper limb: Secondary | ICD-10-CM | POA: Diagnosis not present

## 2020-08-29 LAB — CBC WITH DIFFERENTIAL/PLATELET
Abs Immature Granulocytes: 0.6 10*3/uL — ABNORMAL HIGH (ref 0.00–0.07)
Band Neutrophils: 8 %
Basophils Absolute: 0.1 10*3/uL (ref 0.0–0.1)
Basophils Relative: 2 %
Eosinophils Absolute: 0.1 10*3/uL (ref 0.0–0.5)
Eosinophils Relative: 2 %
HCT: 25.5 % — ABNORMAL LOW (ref 39.0–52.0)
Hemoglobin: 8.3 g/dL — ABNORMAL LOW (ref 13.0–17.0)
Lymphocytes Relative: 5 %
Lymphs Abs: 0.2 10*3/uL — ABNORMAL LOW (ref 0.7–4.0)
MCH: 32.4 pg (ref 26.0–34.0)
MCHC: 32.5 g/dL (ref 30.0–36.0)
MCV: 99.6 fL (ref 80.0–100.0)
Metamyelocytes Relative: 5 %
Monocytes Absolute: 0.6 10*3/uL (ref 0.1–1.0)
Monocytes Relative: 14 %
Myelocytes: 9 %
Neutro Abs: 3 10*3/uL (ref 1.7–7.7)
Neutrophils Relative %: 55 %
Platelets: 230 10*3/uL (ref 150–400)
RBC: 2.56 MIL/uL — ABNORMAL LOW (ref 4.22–5.81)
RDW: 17.9 % — ABNORMAL HIGH (ref 11.5–15.5)
Smear Review: ADEQUATE
WBC: 4.6 10*3/uL (ref 4.0–10.5)
nRBC: 1.3 % — ABNORMAL HIGH (ref 0.0–0.2)

## 2020-08-29 LAB — COMPREHENSIVE METABOLIC PANEL
ALT: 16 U/L (ref 0–44)
AST: 21 U/L (ref 15–41)
Albumin: 3.1 g/dL — ABNORMAL LOW (ref 3.5–5.0)
Alkaline Phosphatase: 105 U/L (ref 38–126)
Anion gap: 7 (ref 5–15)
BUN: 15 mg/dL (ref 8–23)
CO2: 23 mmol/L (ref 22–32)
Calcium: 8.2 mg/dL — ABNORMAL LOW (ref 8.9–10.3)
Chloride: 104 mmol/L (ref 98–111)
Creatinine, Ser: 0.88 mg/dL (ref 0.61–1.24)
GFR, Estimated: >60 mL/min (ref >60)
Glucose, Bld: 89 mg/dL (ref 70–99)
Potassium: 3.8 mmol/L (ref 3.5–5.1)
Sodium: 134 mmol/L — ABNORMAL LOW (ref 135–145)
Total Bilirubin: 0.2 mg/dL — ABNORMAL LOW (ref 0.3–1.2)
Total Protein: 6 g/dL — ABNORMAL LOW (ref 6.5–8.1)

## 2020-08-29 LAB — SAMPLE TO BLOOD BANK

## 2020-08-29 MED ORDER — SODIUM CHLORIDE 0.9 % IV SOLN
375.0000 mg/m2 | Freq: Once | INTRAVENOUS | Status: AC
  Administered 2020-08-29: 700 mg via INTRAVENOUS
  Filled 2020-08-29: qty 50

## 2020-08-29 MED ORDER — SODIUM CHLORIDE 0.9% FLUSH
10.0000 mL | INTRAVENOUS | Status: DC | PRN
  Administered 2020-08-29: 10 mL via INTRAVENOUS
  Filled 2020-08-29: qty 10

## 2020-08-29 MED ORDER — PALONOSETRON HCL INJECTION 0.25 MG/5ML
0.2500 mg | Freq: Once | INTRAVENOUS | Status: AC
  Administered 2020-08-29: 0.25 mg via INTRAVENOUS
  Filled 2020-08-29: qty 5

## 2020-08-29 MED ORDER — SODIUM CHLORIDE 0.9 % IV SOLN
Freq: Once | INTRAVENOUS | Status: AC
  Filled 2020-08-29: qty 250

## 2020-08-29 MED ORDER — ACETAMINOPHEN 325 MG PO TABS
650.0000 mg | ORAL_TABLET | Freq: Once | ORAL | Status: AC
  Administered 2020-08-29: 650 mg via ORAL
  Filled 2020-08-29: qty 2

## 2020-08-29 MED ORDER — SODIUM CHLORIDE 0.9 % IV SOLN
20.0000 mg | Freq: Once | INTRAVENOUS | Status: AC
  Administered 2020-08-29: 20 mg via INTRAVENOUS
  Filled 2020-08-29: qty 20

## 2020-08-29 MED ORDER — DIPHENHYDRAMINE HCL 25 MG PO CAPS
50.0000 mg | ORAL_CAPSULE | Freq: Once | ORAL | Status: AC
  Administered 2020-08-29: 50 mg via ORAL
  Filled 2020-08-29: qty 2

## 2020-08-29 MED ORDER — ETOPOSIDE CHEMO INJECTION 500 MG/25ML
Freq: Once | INTRAVENOUS | Status: DC
  Filled 2020-08-29: qty 11

## 2020-08-29 NOTE — Progress Notes (Signed)
Pt tolerated treatment well. No s/s of distress or reaction noted. Pt stable at discharge.  

## 2020-08-29 NOTE — Progress Notes (Signed)
Patient here for follow-up. Patient will call back to let us know when he wants methotrexate done next week.

## 2020-08-29 NOTE — Progress Notes (Signed)
Hematology/Oncology follow up note [facility] Telephone:([phone] [phone]   Patient Care Team: Olin Hauser, DO as PCP - General (Family Medicine) Earlie Server, MD as Consulting Physician (Hematology and Oncology)  REFERRING PROVIDER: Nobie Putnam *  CHIEF COMPLAINTS/REASON FOR VISIT:  Follow up for axillary mass  HISTORY OF PRESENTING ILLNESS:   Justin Wallace is a  75 y.o.  male with PMH listed below was seen in consultation at the request of  Nobie Putnam *  for evaluation of symptomatic anemia Patient reported history of sudden onset of diffuse body aches, decreased appetite, headache low-grade fever, profound weakness/fatigue about 2 weeks ago.  Prior to the onset of symptoms, he went to a funeral as well as applicable clinic.  He reports that he has received COVID-19 vaccination previously.  He moved from [place].  Daughter is an Therapist, sports and told him to get COVID-19 PCR checked and he was tested negative. Patient also reports a sudden drop of weight since the onset of symptoms.  Patient has a history of celiac disease.  No colonoscopy records or pathology records in current EMR.  Diagnosis was done by [facility] gastroenterology.  Patient reports that he was in his usual state of health until the onset of symptoms.  03/10/2020, patient was evaluated by primary care provider.  Blood work showed mild anemia with hemoglobin of 13.1, Iron panel showed decreased saturation of 8, ferritin 218, TIBC 291, patient was referred to hematology for evaluation of symptomatic anemia and iron deficiency.  Patient reports that her body aches, fever and headache symptoms have improved.  However he continues to feel very tired and fatigued.  Appetite has decreased and weight remains low.  # Physical examination showed left axillary mass  ultrasound confirmed a left axillary large soft tissue mass up to 6.8 cm with internal vascularity. 03/30/2020  CT chest with contrast showed soft tissue mass/enlarged lymph node in the left axilla 6.3 cm, concerning for primary mass or metastatic lymphadenopathy.  There are additional prominent although much smaller right axillary lymph nodes, pretracheal and superior mediastinal lymph nodes.  Findings are concerning for malignancy such as lymphoma or nodal metastatic disease.  # treated for Lymes disease, finished doxycycline 12m BID for 21 days course. 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 he has started treatment with Biktarvy, also started on Bactrim daily.  04/26/2020 PET scan showed large intensely hypermetabolic left axillary mass SUV 28.  Additional small bilateral axilla lymph node with mild metabolic activity SUV 2.  Metabolic activity through the porta hepatis region without enlarged nodes identified.  No bone lesions. 05/02/2020 bone marrow biopsy showed hypercellular marrow for age with trilineage hematopoiesis.  Increased number of CD10 positive B cells, cannot rule out minimal or subtle involvement of marrow by the high-grade B-cell lymphoma. Normal cytogenetics. 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  Positive for BCL6 and MYC expression.  FISH testing showed negative for BCL-2/BCL6/MYC gene rearrangement.   Trisomy 3 and 18 were identified and are nonspecific findings seen in clonal lymphoid neoplasm.  Final results were signed out on 05/30/2020-due to the delay of  FISH results.  B sed on the FISH results, lymphoma is best classified as high-grade B-cell lymphoma not otherwise specified.  It does not meet morphological criteria for diffuse large B-cell lymphoma nor molecular criteria for Burkitt lymphoma.  11 q. alteration testing has been sent.  Results are pending. 04/26/2020, MUGA testing showed normal  LVEF of 56% with normal LV wall motion.  05/13/2020, echocardiogram showed low normal end ejection fraction of 50 to  55%.  #04/19/2020 Mediport placed by Dr. Lucky Cowboy  INTERVAL HISTORY [name] is a 75 y.o. male who has above history reviewed by me today presents for follow up visit for high-grade B-cell lymphoma and management of chemotherapy associated complications. Problems and complaints are listed below: On dose adjusted R-EPOCH treatment.   He feels better comparing to last week. Mucositis has improved. He uses mouth wash. No bleeding.  Still fatigued.  No fever, chills, sob, chest pain, dysuria.   Review of Systems  Constitutional: Positive for fatigue. Negative for appetite change, chills, fever and unexpected weight change.  HENT:   Negative for hearing loss, mouth sores and voice change.   Eyes: Negative for eye problems and icterus.  Respiratory: Negative for chest tightness, cough and shortness of breath.   Cardiovascular: Negative for chest pain and leg swelling.  Gastrointestinal: Negative for abdominal distention and abdominal pain.  Endocrine: Negative for hot flashes.  Genitourinary: Negative for difficulty urinating, dysuria and frequency.   Musculoskeletal: Negative for arthralgias.  Skin: Negative for itching and rash.  Neurological: Negative for light-headedness and numbness.  Hematological: Negative for adenopathy. Does not bruise/bleed easily.  Psychiatric/Behavioral: Negative for confusion.    MEDICAL HISTORY:  Past Medical History:  Diagnosis Date  . Celiac disease   . 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 HIV (human immunodeficiency virus infection) ([place])   . Lyme disease   . Lymphoma ([place])     SURGICAL HISTORY: Past Surgical History:  Procedure Laterality Date  . PORTA CATH INSERTION N/A 04/25/2020   Procedure: PORTA CATH INSERTION;  Surgeon: Algernon Huxley, MD;  Location: [facility];  Service: Cardiovascular;  Laterality: N/A;    SOCIAL HISTORY: Social History   Socioeconomic History  . Marital status: Married    Spouse name: Justin Wallace   . Number  of children: 2  . Years of education: Not on file  . Highest education level: Not on file  Occupational History  . Not on file  Tobacco Use  . Smoking status: Never Smoker  . Smokeless tobacco: Never Used  Vaping Use  . Vaping Use: Never used  Substance and Sexual Activity  . Alcohol use: Yes    Comment: occasional  . Drug use: Never  . Sexual activity: Never  Other Topics Concern  . Not on file  Social History Narrative  . Not on file   Social Determinants of Health   Financial Resource Strain: Not on file  Food Insecurity: Not on file  Transportation Needs: Not on file  Physical Activity: Not on file  Stress: Not on file  Social Connections: Not on file  Intimate Partner Violence: Not on file    FAMILY HISTORY: Family History  Problem Relation Age of Onset  . Hyperlipidemia Mother   . Brain cancer Father     ALLERGIES:  has No Known Allergies.  MEDICATIONS:  Current Outpatient Medications  Medication Sig Dispense Refill  . acyclovir (ZOVIRAX) 400 MG tablet Take 1 tablet (400 mg total) by mouth 2 (two) times daily. 60 tablet 5  . allopurinol (ZYLOPRIM) 300 MG tablet Take 1 tablet (300 mg total) by mouth 2 (two) times daily. 180 tablet 0  . bictegravir-emtricitabine-tenofovir AF (BIKTARVY) 50-200-25 MG TABS tablet Take 1 tablet by mouth daily. 30 tablet 5  . chlorhexidine (PERIDEX) 0.12 % solution Use as directed 15 mLs in  the mouth or throat 2 (two) times daily. 473 mL 0  . ciprofloxacin (CIPRO) 500 MG tablet Take 1 tablet (500 mg total) by mouth 2 (two) times daily. 30 tablet 0  . hydrocortisone (ANUSOL-HC) 2.5 % rectal cream Place 1 application rectally 2 (two) times daily. For up to 1-2 weeks. As needed hemorrhoids 30 g 2  . lidocaine-prilocaine (EMLA) cream Apply to affected area once 30 g 3  . magic mouthwash w/lidocaine SOLN Take 5 mLs by mouth 4 (four) times daily as needed for mouth pain. Sig: Swish/spit 5-10 ml four times a day as needed 480 mL 1  .  ondansetron (ZOFRAN) 8 MG tablet Take 1 tablet (8 mg total) by mouth 2 (two) times daily as needed for refractory nausea / vomiting. Start on the third day after chemotherapy. 30 tablet 1  . polyethylene glycol powder (GLYCOLAX/MIRALAX) 17 GM/SCOOP powder Take by mouth as needed.    . predniSONE (DELTASONE) 50 MG tablet Take 2 tablets on Day 2, 3, 4, 5 of each chemotherapy treatments. 8 tablet 2  . prochlorperazine (COMPAZINE) 10 MG tablet Take 1 tablet (10 mg total) by mouth every 6 (six) hours as needed (Nausea or vomiting). 30 tablet 1  . sulfamethoxazole-trimethoprim (BACTRIM DS) 800-160 MG tablet Take 1 tablet by mouth daily. 30 tablet 2   No current facility-administered medications for this visit.   Facility-Administered Medications Ordered in Other Visits  Medication Dose Route Frequency Provider Last Rate Last Admin  . DOXOrubicin (ADRIAMYCIN) 22 mg, etoposide (VEPESID) 114 mg, vinCRIStine (ONCOVIN) 0.8 mg in sodium chloride 0.9 % 500 mL chemo infusion   Intravenous Once Earlie Server, MD   Given at 08/29/20 1346  . methotrexate (PF) 12 mg in sodium chloride (PF) 0.9 % INTRATHECAL chemo injection   Intrathecal Once Earlie Server, MD      . methotrexate (PF) 12 mg in sodium chloride (PF) 0.9 % INTRATHECAL chemo injection   Intrathecal Once Earlie Server, MD      . sodium chloride flush (NS) 0.9 % injection 10 mL  10 mL Intravenous PRN Earlie Server, MD   10 mL at 08/29/20 0839     PHYSICAL EXAMINATION: ECOG PERFORMANCE STATUS: 1 - Symptomatic but completely ambulatory Vitals:   08/29/20 0854  BP: 108/62  Pulse: 67  Resp: 18  Temp: (!) 97.4 F (36.3 C)   Filed Weights   08/29/20 0854  Weight: 172 lb 8 oz (78.2 kg)    Physical Exam Constitutional:      General: He is not in acute distress. HENT:     Head: Normocephalic and atraumatic.  Eyes:     General: No scleral icterus. Cardiovascular:     Rate and Rhythm: Normal rate and regular rhythm.     Heart sounds: Normal heart sounds.   Pulmonary:     Effort: Pulmonary effort is normal. No respiratory distress.     Breath sounds: No wheezing.  Abdominal:     General: Bowel sounds are normal. There is no distension.     Palpations: Abdomen is soft.  Musculoskeletal:        General: No deformity. Normal range of motion.     Cervical back: Normal range of motion and neck supple.     Comments: Left axillary mass is no longer palpable  Skin:    General: Skin is warm and dry.     Findings: No erythema or rash.  Neurological:     Mental Status: He is alert and oriented to person,  place, and time. Mental status is at baseline.     Cranial Nerves: No cranial nerve deficit.     Coordination: Coordination normal.  Psychiatric:        Mood and Affect: Mood normal.       LABORATORY DATA:  I have reviewed the data as listed Lab Results  Component Value Date   WBC 4.6 08/29/2020   HGB 8.3 (L) 08/29/2020   HCT 25.5 (L) 08/29/2020   MCV 99.6 08/29/2020   PLT 230 08/29/2020   Recent Labs    03/10/20 1113 03/17/20 1029 05/05/20 0956 06/06/20 0822 08/22/20 1102 08/25/20 1109 08/29/20 0818  NA 138 137 139   < > 135 137 134*  K 4.8 3.8 4.4   < > 3.9 4.0 3.8  CL 106 104 107   < > 103 103 104  CO2 27 27 27    < > 20* 26 23  GLUCOSE 94 97 97   < > 106* 95 89  BUN 23 21 19    < > 28* 18 15  CREATININE 0.91 1.09 0.88   < > 0.92 1.04 0.88  CALCIUM 8.4* 8.1* 9.1   < > 8.9 9.0 8.2*  GFRNONAA 84 >60 86   < > >60 >60 >60  GFRAA 98 >60 100  --   --   --   --   PROT 6.8 8.0 7.4   < > 6.6 6.7 6.0*  ALBUMIN  --  2.7*  --    < > 3.3* 3.4* 3.1*  AST 20 24 15    < > 22 17 21   ALT 13 18 8*   < > 32 22 16  ALKPHOS  --  140*  --    < > 78 90 105  BILITOT 0.4 0.3 0.5   < > 0.5 0.5 0.2*   < > = values in this interval not displayed.   Iron/TIBC/Ferritin/ %Sat    Component Value Date/Time   IRON 22 (L) 03/17/2020 1029   IRON 17 02/17/2018 0000   TIBC 286 03/17/2020 1029   TIBC 425 02/17/2018 0000   FERRITIN 284 03/17/2020 1029    IRONPCTSAT 8 (L) 03/17/2020 1029   IRONPCTSAT 8 (L) 03/10/2020 1113      RADIOGRAPHIC STUDIES: I have personally reviewed the radiological images as listed and agreed with the findings in the report. NM PET Image Restage (PS) Skull Base to Thigh  Result Date: 08/03/2020 CLINICAL DATA:  Subsequent treatment strategy for high-grade lymphoma. B-cell lymphoma. EXAM: NUCLEAR MEDICINE PET SKULL BASE TO THIGH TECHNIQUE: 8.8 mCi F-18 FDG was injected intravenously. Full-ring PET imaging was performed from the skull base to thigh after the radiotracer. CT data was obtained and used for attenuation correction and anatomic localization. Fasting blood glucose: 93 mg/dl COMPARISON:  None. FINDINGS: Mediastinal blood pool activity: SUV max 1.6 Liver activity: SUV max 2.6 NECK: No hypermetabolic lymph nodes in the neck. Incidental CT findings: none CHEST: Differing positioning of the arms compared to prior. The large LEFT axillary mass seen on comparison exam is reduced in size and metabolic activity measuring 2.0 x 1.2 cm compared to 5.0 x 2.8 cm. The activity of the mass is markedly reduced with SUV max equal 2.5 decreased from SUV max equal 28.1. The position of the axillary mass is change related to arm positioning. No additional hypermetabolic axillary nodes. No hypermetabolic mediastinal nodes. Incidental CT findings: No suspicious pulmonary nodules. Port in the anterior chest wall with tip in distal SVC. ABDOMEN/PELVIS: Spleen  is increased in metabolic activity compared to prior with SUV max 4.1. Spleen is normal size and not changed from prior. Favor increased metabolic activity related to GCSF type response. No hypermetabolic abdominopelvic lymph nodes. No enlarged abdominopelvic nodes. Normal liver. There is diffuse activity within the gastric wall which is intense with SUV max equal 8.2. Incidental CT findings: None SKELETON: Diffuse uniform intense activity within the entire marrow space consistent GCSF type  response. Incidental CT findings: none IMPRESSION: 1. Marked reduction in size and metabolic activity of the LEFT axillary lymph node/mass with mild residual metabolic activity ( Deauville 3). 2. No new hypermetabolic lymph nodes on skull base to thigh scan. 3. Increased metabolic activity normal volume spleen is favored GCSF S type response. 4. Intense hypermetabolic activity throughout the stomach is favor gastritis. 5. Uniform increase in marrow activity consistent wit GCSF type response. Electronically Signed   By: Suzy Bouchard M.D.   On: 08/03/2020 12:17   DG FLUORO GUIDED LOC OF NEEDLE/CATH TIP FOR SPINAL INJECT LT  Result Date: 08/17/2020 CLINICAL DATA:  High-grade B-cell lymphoma, patient presents for intrathecal methotrexate EXAM: DIAGNOSTIC LUMBAR PUNCTURE UNDER FLUOROSCOPIC GUIDANCE FLUOROSCOPY TIME:  Fluoroscopy Time:  0.1 minute Radiation Exposure Index (if provided by the fluoroscopic device): 0.1 mGy Number of Acquired Spot Images: 0 PROCEDURE: Informed consent was obtained from the patient prior to the procedure, including potential complications of headache, allergy, and pain. With the patient prone, the lower back was prepped with Betadine. 1% Lidocaine was used for local anesthesia. Lumbar puncture was performed at the L3-4 level using a 22 gauge needle with return of clear CSF. 12 mg of methotrexate was injected into the subarachnoid space. The patient tolerated the procedure well and there were no apparent complications. No immediate complications. IMPRESSION: Successful fluoroscopic guided lumbar puncture for intrathecal chemotherapy. Electronically Signed   By: Kathreen Devoid   On: 08/17/2020 11:23   NM Myocar Multi W/Spect W/Wall Motion / EF  Result Date: 06/15/2020  There was no ST segment deviation noted during stress.  The study is normal.  This is a low risk study.  The left ventricular ejection fraction is low normal (51%).  There is no evidence for ischemia    NM PET  Image Restage (PS) Skull Base to Thigh  Result Date: 08/03/2020 CLINICAL DATA:  Subsequent treatment strategy for high-grade lymphoma. B-cell lymphoma. EXAM: NUCLEAR MEDICINE PET SKULL BASE TO THIGH TECHNIQUE: 8.8 mCi F-18 FDG was injected intravenously. Full-ring PET imaging was performed from the skull base to thigh after the radiotracer. CT data was obtained and used for attenuation correction and anatomic localization. Fasting blood glucose: 93 mg/dl COMPARISON:  None. FINDINGS: Mediastinal blood pool activity: SUV max 1.6 Liver activity: SUV max 2.6 NECK: No hypermetabolic lymph nodes in the neck. Incidental CT findings: none CHEST: Differing positioning of the arms compared to prior. The large LEFT axillary mass seen on comparison exam is reduced in size and metabolic activity measuring 2.0 x 1.2 cm compared to 5.0 x 2.8 cm. The activity of the mass is markedly reduced with SUV max equal 2.5 decreased from SUV max equal 28.1. The position of the axillary mass is change related to arm positioning. No additional hypermetabolic axillary nodes. No hypermetabolic mediastinal nodes. Incidental CT findings: No suspicious pulmonary nodules. Port in the anterior chest wall with tip in distal SVC. ABDOMEN/PELVIS: Spleen is increased in metabolic activity compared to prior with SUV max 4.1. Spleen is normal size and not changed from prior. Favor  increased metabolic activity related to GCSF type response. No hypermetabolic abdominopelvic lymph nodes. No enlarged abdominopelvic nodes. Normal liver. There is diffuse activity within the gastric wall which is intense with SUV max equal 8.2. Incidental CT findings: None SKELETON: Diffuse uniform intense activity within the entire marrow space consistent GCSF type response. Incidental CT findings: none IMPRESSION: 1. Marked reduction in size and metabolic activity of the LEFT axillary lymph node/mass with mild residual metabolic activity ( Deauville 3). 2. No new  hypermetabolic lymph nodes on skull base to thigh scan. 3. Increased metabolic activity normal volume spleen is favored GCSF S type response. 4. Intense hypermetabolic activity throughout the stomach is favor gastritis. 5. Uniform increase in marrow activity consistent wit GCSF type response. Electronically Signed   By: Suzy Bouchard M.D.   On: 08/03/2020 12:17   DG FLUORO GUIDED LOC OF NEEDLE/CATH TIP FOR SPINAL INJECT LT  Result Date: 08/17/2020 CLINICAL DATA:  High-grade B-cell lymphoma, patient presents for intrathecal methotrexate EXAM: DIAGNOSTIC LUMBAR PUNCTURE UNDER FLUOROSCOPIC GUIDANCE FLUOROSCOPY TIME:  Fluoroscopy Time:  0.1 minute Radiation Exposure Index (if provided by the fluoroscopic device): 0.1 mGy Number of Acquired Spot Images: 0 PROCEDURE: Informed consent was obtained from the patient prior to the procedure, including potential complications of headache, allergy, and pain. With the patient prone, the lower back was prepped with Betadine. 1% Lidocaine was used for local anesthesia. Lumbar puncture was performed at the L3-4 level using a 22 gauge needle with return of clear CSF. 12 mg of methotrexate was injected into the subarachnoid space. The patient tolerated the procedure well and there were no apparent complications. No immediate complications. IMPRESSION: Successful fluoroscopic guided lumbar puncture for intrathecal chemotherapy. Electronically Signed   By: Kathreen Devoid   On: 08/17/2020 11:23   DG FLUORO GUIDED LOC OF NEEDLE/CATH TIP FOR SPINAL INJECT LT  Result Date: 07/27/2020 CLINICAL DATA:  High-grade B-cell lymphoma. Patient presents for intrathecal methotrexate injection. EXAM: DIAGNOSTIC LUMBAR PUNCTURE UNDER FLUOROSCOPIC GUIDANCE FLUOROSCOPY TIME:  Fluoroscopy Time:  0.1 minute Radiation Exposure Index (if provided by the fluoroscopic device): 0.2 mGy Number of Acquired Spot Images: 0 PROCEDURE: Informed consent was obtained from the patient prior to the procedure,  including potential complications of headache, allergy, and pain. With the patient prone, the lower back was prepped with Betadine. 1% Lidocaine was used for local anesthesia. Lumbar puncture was performed at the L3-4 level using a 22 gauge needle with return of clear CSF . 3 ml of CSF were obtained for laboratory studies. 12 mg of methotrexate was injected into the subarachnoid space. The patient tolerated the procedure well and there were no apparent complications. No immediate complications. IMPRESSION: Successful fluoroscopic guided lumbar puncture for intrathecal chemotherapy. Electronically Signed   By: Kathreen Devoid   On: 07/27/2020 10:37   DG FLUORO GUIDED LOC OF NEEDLE/CATH TIP FOR SPINAL INJECT LT  Result Date: 07/05/2020 CLINICAL DATA:  High-grade B-cell lymphoma. EXAM: FLUOROSCOPICALLY GUIDED LUMBAR PUNCTURE FOR INTRATHECAL CHEMOTHERAPY FLUOROSCOPY TIME:  4 minutes 30 seconds fluoroscopy time. Radiation dose 69.4 mGy. PROCEDURE: Informed consent was obtained from the patient prior to the procedure, including potential complications of headache, allergy, and pain. With the patient prone, the lower back was prepped with Betadine. 1% Lidocaine was used for local anesthesia. Lumbar puncture was performed at the L3-L4 level using a gauge needle with return of clearCSF. Confirmation of intrathecal needle tip location was performed by administering 3 cc of Omnipaque 180. 12 mg of methotrexate supplied by pharmacy. 12  mg of methotrexate was injected into the subarachnoid space. The patient tolerated the procedure well without apparent complication. IMPRESSION: Successful intrathecal methotrexate administration. Electronically Signed   By: Marcello Moores  Register   On: 07/05/2020 11:03   DG FLUORO GUIDED LOC OF NEEDLE/CATH TIP FOR SPINAL INJECT LT  Result Date: 06/14/2020 CLINICAL DATA:  High-grade B-cell lymphoma. Patient in for intrathecal methotrexate injection. EXAM: FLUOROSCOPICALLY GUIDED LUMBAR PUNCTURE  FOR INTRATHECAL CHEMOTHERAPY FLUOROSCOPY TIME:  1 minutes 42 seconds fluoroscopy time. 35.3 mGy radiation dose. Four images obtained. PROCEDURE: Informed consent was obtained from the patient prior to the procedure, including potential complications of headache, allergy, pain, and CSF leak. With the patient prone, the lower back was prepped with Betadine. 1% Lidocaine was used for local anesthesia. Lumbar puncture was performed at the L3-L4 level using a gauge needle with return of clearCSF. 6 cc of CSF obtained for laboratory studies. Due to slow CSF flow 2 cc of Omnipaque 300 administered into the spinal canal to ensure intrathecal location. 12 mg of methotrexate was injected into the subarachnoid space. The patient tolerated the procedure well without apparent complication. IMPRESSION: Successful lumbar puncture for intrathecal chemotherapy. No complications. Electronically Signed   By: Marcello Moores  Register   On: 06/14/2020 13:05      ASSESSMENT & PLAN:  1. 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[place])   2. Anemia due to antineoplastic chemotherapy   3. Chemotherapy-induced neutropenia ([facility])   4. AIDS (acquired immune deficiency syndrome) ([facility])   5. 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 Left axillary mass final pathology is high-grade B-cell lymphoma, nonspecific type.   CD 20+, MYC+, BCL2-, BCL-6+, Cyclin D1-, Ki-67 >95% Status post 4 cycles of dose adjusted EPOCH with rituximab.  Patient had +1 dose level increase for cycle 2/cycle 3, +2 dose level increase for cycle 4 He did not tolerated + 2 dose increased due to cytopenia and mucositis.  Labs are reviewed and discussed with patient. Counts have improved.  Proceed with cycle 5 chemotherapy today- reduce to +1 dose level.  GCSF support change to Udenyca per patient's requests.  He will also proceed with intrathecal chemotherapy next week.   Chemotherapy-induced neutropenia,  resolved. ANC is 3 today.  #Chemotherapy-induced anemia, symptomatic anemia, s/p 1 PRBC transfusion during the interval.  Close monitor counts.   #Chemotherapy-induced thrombocytopenia, platelet count has normalized. Stop Cipro.  #Mucositis/ stomatitis, improved,. continue Magic mouthwash  Repeat CBC/ hold tube 2/10, 2/14, 2/17,    Prophylaxis -Continue Bactrim every other day for PCP prophylaxis -Continue acyclovir 400 mg twice daily -Continue allopurinol 300 mg twice daily  # AIDS patient follows with ID physician.  Has been on antiretroviral therapy with Biktarvy #Neuropathy, chemotherapy related.  Grade 1.  Stable. .   Supportive care measures are necessary for patient well-being and will be provided as necessary. We spent sufficient time to discuss many aspect of care, questions were answered to patient and wife's satisfaction.     All questions were answered. The patient knows to call the clinic with any problems questions or concerns.  cc Nobie Putnam *  Follow-up in  3 weeks.   Earlie Server, MD, PhD Hematology Oncology [facility] Pager- [phone] 08/29/2020

## 2020-08-30 ENCOUNTER — Other Ambulatory Visit: Payer: Self-pay

## 2020-08-30 ENCOUNTER — Inpatient Hospital Stay: Payer: Medicare HMO | Attending: Oncology

## 2020-08-30 VITALS — BP 113/63 | HR 63 | Temp 97.0°F | Resp 18 | Wt 175.2 lb

## 2020-08-30 DIAGNOSIS — C8514 Unspecified B-cell lymphoma, lymph nodes of axilla and upper limb: Secondary | ICD-10-CM | POA: Insufficient documentation

## 2020-08-30 DIAGNOSIS — D701 Agranulocytosis secondary to cancer chemotherapy: Secondary | ICD-10-CM | POA: Insufficient documentation

## 2020-08-30 DIAGNOSIS — D6959 Other secondary thrombocytopenia: Secondary | ICD-10-CM | POA: Insufficient documentation

## 2020-08-30 DIAGNOSIS — K1230 Oral mucositis (ulcerative), unspecified: Secondary | ICD-10-CM | POA: Diagnosis not present

## 2020-08-30 DIAGNOSIS — G620 Drug-induced polyneuropathy: Secondary | ICD-10-CM | POA: Diagnosis not present

## 2020-08-30 DIAGNOSIS — D6481 Anemia due to antineoplastic chemotherapy: Secondary | ICD-10-CM | POA: Diagnosis not present

## 2020-08-30 DIAGNOSIS — C8512 Unspecified B-cell lymphoma, intrathoracic lymph nodes: Secondary | ICD-10-CM | POA: Diagnosis not present

## 2020-08-30 DIAGNOSIS — Z5111 Encounter for antineoplastic chemotherapy: Secondary | ICD-10-CM | POA: Diagnosis not present

## 2020-08-30 DIAGNOSIS — Z21 Asymptomatic human immunodeficiency virus [HIV] infection status: Secondary | ICD-10-CM | POA: Insufficient documentation

## 2020-08-30 DIAGNOSIS — C8510 Unspecified B-cell lymphoma, unspecified site: Secondary | ICD-10-CM

## 2020-08-30 DIAGNOSIS — Z5112 Encounter for antineoplastic immunotherapy: Secondary | ICD-10-CM | POA: Insufficient documentation

## 2020-08-30 MED ORDER — VINCRISTINE SULFATE CHEMO INJECTION 1 MG/ML
Freq: Once | INTRAVENOUS | Status: DC
  Filled 2020-08-30: qty 11

## 2020-08-30 MED ORDER — SODIUM CHLORIDE 0.9 % IV SOLN
Freq: Once | INTRAVENOUS | Status: DC
  Filled 2020-08-30: qty 250

## 2020-08-30 NOTE — Progress Notes (Signed)
Patient stable at discharge.

## 2020-08-30 NOTE — Progress Notes (Signed)
Patient not taking steroids at home.  Per MD no IV dex today.

## 2020-08-31 ENCOUNTER — Inpatient Hospital Stay: Payer: Medicare HMO

## 2020-08-31 VITALS — BP 112/64 | HR 69 | Temp 96.5°F | Resp 18

## 2020-08-31 DIAGNOSIS — K1230 Oral mucositis (ulcerative), unspecified: Secondary | ICD-10-CM | POA: Diagnosis not present

## 2020-08-31 DIAGNOSIS — D6481 Anemia due to antineoplastic chemotherapy: Secondary | ICD-10-CM | POA: Diagnosis not present

## 2020-08-31 DIAGNOSIS — C8514 Unspecified B-cell lymphoma, lymph nodes of axilla and upper limb: Secondary | ICD-10-CM | POA: Diagnosis not present

## 2020-08-31 DIAGNOSIS — Z5112 Encounter for antineoplastic immunotherapy: Secondary | ICD-10-CM | POA: Diagnosis not present

## 2020-08-31 DIAGNOSIS — C8512 Unspecified B-cell lymphoma, intrathoracic lymph nodes: Secondary | ICD-10-CM | POA: Diagnosis not present

## 2020-08-31 DIAGNOSIS — Z5111 Encounter for antineoplastic chemotherapy: Secondary | ICD-10-CM | POA: Diagnosis not present

## 2020-08-31 DIAGNOSIS — D701 Agranulocytosis secondary to cancer chemotherapy: Secondary | ICD-10-CM | POA: Diagnosis not present

## 2020-08-31 DIAGNOSIS — C8510 Unspecified B-cell lymphoma, unspecified site: Secondary | ICD-10-CM

## 2020-08-31 DIAGNOSIS — Z21 Asymptomatic human immunodeficiency virus [HIV] infection status: Secondary | ICD-10-CM | POA: Diagnosis not present

## 2020-08-31 DIAGNOSIS — D6959 Other secondary thrombocytopenia: Secondary | ICD-10-CM | POA: Diagnosis not present

## 2020-08-31 DIAGNOSIS — G620 Drug-induced polyneuropathy: Secondary | ICD-10-CM | POA: Diagnosis not present

## 2020-08-31 MED ORDER — PALONOSETRON HCL INJECTION 0.25 MG/5ML
0.2500 mg | Freq: Once | INTRAVENOUS | Status: AC
  Administered 2020-08-31: 0.25 mg via INTRAVENOUS
  Filled 2020-08-31: qty 5

## 2020-08-31 MED ORDER — SODIUM CHLORIDE 0.9 % IV SOLN
Freq: Once | INTRAVENOUS | Status: AC
  Filled 2020-08-31: qty 250

## 2020-08-31 MED ORDER — VINCRISTINE SULFATE CHEMO INJECTION 1 MG/ML
Freq: Once | INTRAVENOUS | Status: DC
  Filled 2020-08-31: qty 11

## 2020-08-31 NOTE — Progress Notes (Signed)
Pt here for pump d/c and re-connect pump D3. Verified with pt that he is taking prednisone. Completed tx without incident. Return as scheduled 1 day. Pt discharged stable.

## 2020-09-01 ENCOUNTER — Inpatient Hospital Stay: Payer: Medicare HMO

## 2020-09-01 ENCOUNTER — Other Ambulatory Visit: Payer: Self-pay

## 2020-09-01 VITALS — BP 100/61 | HR 70 | Temp 96.8°F | Resp 16

## 2020-09-01 DIAGNOSIS — C8510 Unspecified B-cell lymphoma, unspecified site: Secondary | ICD-10-CM

## 2020-09-01 DIAGNOSIS — D6959 Other secondary thrombocytopenia: Secondary | ICD-10-CM | POA: Diagnosis not present

## 2020-09-01 DIAGNOSIS — C8512 Unspecified B-cell lymphoma, intrathoracic lymph nodes: Secondary | ICD-10-CM | POA: Diagnosis not present

## 2020-09-01 DIAGNOSIS — G620 Drug-induced polyneuropathy: Secondary | ICD-10-CM | POA: Diagnosis not present

## 2020-09-01 DIAGNOSIS — Z5111 Encounter for antineoplastic chemotherapy: Secondary | ICD-10-CM | POA: Diagnosis not present

## 2020-09-01 DIAGNOSIS — Z21 Asymptomatic human immunodeficiency virus [HIV] infection status: Secondary | ICD-10-CM | POA: Diagnosis not present

## 2020-09-01 DIAGNOSIS — D701 Agranulocytosis secondary to cancer chemotherapy: Secondary | ICD-10-CM | POA: Diagnosis not present

## 2020-09-01 DIAGNOSIS — D6481 Anemia due to antineoplastic chemotherapy: Secondary | ICD-10-CM | POA: Diagnosis not present

## 2020-09-01 DIAGNOSIS — K1230 Oral mucositis (ulcerative), unspecified: Secondary | ICD-10-CM | POA: Diagnosis not present

## 2020-09-01 DIAGNOSIS — C8514 Unspecified B-cell lymphoma, lymph nodes of axilla and upper limb: Secondary | ICD-10-CM | POA: Diagnosis not present

## 2020-09-01 DIAGNOSIS — Z5112 Encounter for antineoplastic immunotherapy: Secondary | ICD-10-CM | POA: Diagnosis not present

## 2020-09-01 MED ORDER — SODIUM CHLORIDE 0.9% FLUSH
10.0000 mL | INTRAVENOUS | Status: DC | PRN
  Administered 2020-09-01 (×2): 10 mL
  Filled 2020-09-01: qty 10

## 2020-09-01 MED ORDER — VINCRISTINE SULFATE CHEMO INJECTION 1 MG/ML
Freq: Once | INTRAVENOUS | Status: DC
  Filled 2020-09-01: qty 11

## 2020-09-01 NOTE — Progress Notes (Signed)
Patient here for pump d/c and re-connect home infusion of doxorubicin, etoposide, vincrisine. He is taking prescribed prednisone. Reports some lightheadedness when bending to pick things up. Orthostatic VSS. Tolerating PO fluids. Encouraged to increase PO fluid intake. Pump infusing on discharge. Returns tomorrow for pump d/c. Discharged in stable condition.

## 2020-09-02 ENCOUNTER — Inpatient Hospital Stay: Payer: Medicare HMO

## 2020-09-02 VITALS — BP 110/60 | HR 72 | Temp 96.4°F | Resp 18

## 2020-09-02 DIAGNOSIS — Z21 Asymptomatic human immunodeficiency virus [HIV] infection status: Secondary | ICD-10-CM | POA: Diagnosis not present

## 2020-09-02 DIAGNOSIS — C8510 Unspecified B-cell lymphoma, unspecified site: Secondary | ICD-10-CM

## 2020-09-02 DIAGNOSIS — D701 Agranulocytosis secondary to cancer chemotherapy: Secondary | ICD-10-CM | POA: Diagnosis not present

## 2020-09-02 DIAGNOSIS — C8514 Unspecified B-cell lymphoma, lymph nodes of axilla and upper limb: Secondary | ICD-10-CM | POA: Diagnosis not present

## 2020-09-02 DIAGNOSIS — D6959 Other secondary thrombocytopenia: Secondary | ICD-10-CM | POA: Diagnosis not present

## 2020-09-02 DIAGNOSIS — K1230 Oral mucositis (ulcerative), unspecified: Secondary | ICD-10-CM | POA: Diagnosis not present

## 2020-09-02 DIAGNOSIS — G620 Drug-induced polyneuropathy: Secondary | ICD-10-CM | POA: Diagnosis not present

## 2020-09-02 DIAGNOSIS — D6481 Anemia due to antineoplastic chemotherapy: Secondary | ICD-10-CM | POA: Diagnosis not present

## 2020-09-02 DIAGNOSIS — Z5112 Encounter for antineoplastic immunotherapy: Secondary | ICD-10-CM | POA: Diagnosis not present

## 2020-09-02 DIAGNOSIS — Z5111 Encounter for antineoplastic chemotherapy: Secondary | ICD-10-CM | POA: Diagnosis not present

## 2020-09-02 MED ORDER — SODIUM CHLORIDE 0.9 % IV SOLN
900.0000 mg/m2 | Freq: Once | INTRAVENOUS | Status: AC
  Administered 2020-09-02: 1700 mg via INTRAVENOUS
  Filled 2020-09-02: qty 85

## 2020-09-02 MED ORDER — SODIUM CHLORIDE 0.9% FLUSH
10.0000 mL | INTRAVENOUS | Status: DC | PRN
  Filled 2020-09-02: qty 10

## 2020-09-02 MED ORDER — HEPARIN SOD (PORK) LOCK FLUSH 100 UNIT/ML IV SOLN
500.0000 [IU] | Freq: Once | INTRAVENOUS | Status: AC | PRN
  Administered 2020-09-02: 500 [IU]
  Filled 2020-09-02: qty 5

## 2020-09-02 MED ORDER — SODIUM CHLORIDE 0.9 % IV SOLN
Freq: Once | INTRAVENOUS | Status: AC
  Filled 2020-09-02: qty 250

## 2020-09-02 MED ORDER — HEPARIN SOD (PORK) LOCK FLUSH 100 UNIT/ML IV SOLN
INTRAVENOUS | Status: AC
  Filled 2020-09-02: qty 5

## 2020-09-02 MED ORDER — PALONOSETRON HCL INJECTION 0.25 MG/5ML
0.2500 mg | Freq: Once | INTRAVENOUS | Status: AC
  Administered 2020-09-02: 0.25 mg via INTRAVENOUS
  Filled 2020-09-02: qty 5

## 2020-09-02 NOTE — Progress Notes (Signed)
Pt tolerated infusion well. No s/s of distress or reaction noted. Pt stable at discharge.  

## 2020-09-05 ENCOUNTER — Inpatient Hospital Stay: Payer: Medicare HMO

## 2020-09-05 DIAGNOSIS — D701 Agranulocytosis secondary to cancer chemotherapy: Secondary | ICD-10-CM | POA: Diagnosis not present

## 2020-09-05 DIAGNOSIS — G620 Drug-induced polyneuropathy: Secondary | ICD-10-CM | POA: Diagnosis not present

## 2020-09-05 DIAGNOSIS — Z5112 Encounter for antineoplastic immunotherapy: Secondary | ICD-10-CM | POA: Diagnosis not present

## 2020-09-05 DIAGNOSIS — D6481 Anemia due to antineoplastic chemotherapy: Secondary | ICD-10-CM | POA: Diagnosis not present

## 2020-09-05 DIAGNOSIS — Z21 Asymptomatic human immunodeficiency virus [HIV] infection status: Secondary | ICD-10-CM | POA: Diagnosis not present

## 2020-09-05 DIAGNOSIS — C8510 Unspecified B-cell lymphoma, unspecified site: Secondary | ICD-10-CM

## 2020-09-05 DIAGNOSIS — D6959 Other secondary thrombocytopenia: Secondary | ICD-10-CM | POA: Diagnosis not present

## 2020-09-05 DIAGNOSIS — C8514 Unspecified B-cell lymphoma, lymph nodes of axilla and upper limb: Secondary | ICD-10-CM | POA: Diagnosis not present

## 2020-09-05 DIAGNOSIS — K1230 Oral mucositis (ulcerative), unspecified: Secondary | ICD-10-CM | POA: Diagnosis not present

## 2020-09-05 DIAGNOSIS — Z5111 Encounter for antineoplastic chemotherapy: Secondary | ICD-10-CM | POA: Diagnosis not present

## 2020-09-05 MED ORDER — PEGFILGRASTIM-CBQV 6 MG/0.6ML ~~LOC~~ SOSY
6.0000 mg | PREFILLED_SYRINGE | Freq: Once | SUBCUTANEOUS | Status: AC
  Administered 2020-09-05: 6 mg via SUBCUTANEOUS
  Filled 2020-09-05: qty 0.6

## 2020-09-05 NOTE — Progress Notes (Signed)
Patient for LP methotrexate injection 2/9, called and spoke with patient on phone, made aware to be here @ 0915, and driver for discharge post procedure.stated understanding.

## 2020-09-06 DIAGNOSIS — C8512 Unspecified B-cell lymphoma, intrathoracic lymph nodes: Secondary | ICD-10-CM | POA: Diagnosis not present

## 2020-09-07 ENCOUNTER — Other Ambulatory Visit: Payer: Self-pay

## 2020-09-07 ENCOUNTER — Inpatient Hospital Stay: Payer: Medicare HMO

## 2020-09-07 ENCOUNTER — Ambulatory Visit
Admission: RE | Admit: 2020-09-07 | Discharge: 2020-09-07 | Disposition: A | Discharge status: Home / Self Care | Payer: Medicare HMO | Source: Ambulatory Visit | Attending: Oncology | Admitting: Oncology

## 2020-09-07 VITALS — BP 100/61 | HR 63 | Temp 97.5°F | Resp 16 | Ht 69.0 in | Wt 160.0 lb

## 2020-09-07 DIAGNOSIS — C8510 Unspecified B-cell lymphoma, unspecified site: Secondary | ICD-10-CM | POA: Diagnosis not present

## 2020-09-07 LAB — CBC
HCT: 25.8 % — ABNORMAL LOW (ref 39.0–52.0)
Hemoglobin: 8.4 g/dL — ABNORMAL LOW (ref 13.0–17.0)
MCH: 32.4 pg (ref 26.0–34.0)
MCHC: 32.6 g/dL (ref 30.0–36.0)
MCV: 99.6 fL (ref 80.0–100.0)
Platelets: 184 10*3/uL (ref 150–400)
RBC: 2.59 MIL/uL — ABNORMAL LOW (ref 4.22–5.81)
RDW: 15.9 % — ABNORMAL HIGH (ref 11.5–15.5)
WBC: 5.3 10*3/uL (ref 4.0–10.5)
nRBC: 0 % (ref 0.0–0.2)

## 2020-09-07 LAB — APTT: aPTT: 26 seconds (ref 24–36)

## 2020-09-07 LAB — PROTIME-INR
INR: 1 (ref 0.8–1.2)
Prothrombin Time: 12.3 seconds (ref 11.4–15.2)

## 2020-09-07 MED ORDER — SODIUM CHLORIDE (PF) 0.9 % IJ SOLN
Freq: Once | INTRAMUSCULAR | Status: DC
  Filled 2020-09-07: qty 0.48

## 2020-09-07 MED FILL — BIKTARVY 50-200-25 MG TABS: 50-200-25 | 30 days supply | Qty: 30 | Fill #4

## 2020-09-08 ENCOUNTER — Inpatient Hospital Stay: Payer: Medicare HMO

## 2020-09-08 DIAGNOSIS — Z5112 Encounter for antineoplastic immunotherapy: Secondary | ICD-10-CM | POA: Diagnosis not present

## 2020-09-08 DIAGNOSIS — D6481 Anemia due to antineoplastic chemotherapy: Secondary | ICD-10-CM | POA: Diagnosis not present

## 2020-09-08 DIAGNOSIS — C8510 Unspecified B-cell lymphoma, unspecified site: Secondary | ICD-10-CM

## 2020-09-08 DIAGNOSIS — C8514 Unspecified B-cell lymphoma, lymph nodes of axilla and upper limb: Secondary | ICD-10-CM | POA: Diagnosis not present

## 2020-09-08 DIAGNOSIS — K1230 Oral mucositis (ulcerative), unspecified: Secondary | ICD-10-CM | POA: Diagnosis not present

## 2020-09-08 DIAGNOSIS — D6959 Other secondary thrombocytopenia: Secondary | ICD-10-CM | POA: Diagnosis not present

## 2020-09-08 DIAGNOSIS — Z21 Asymptomatic human immunodeficiency virus [HIV] infection status: Secondary | ICD-10-CM | POA: Diagnosis not present

## 2020-09-08 DIAGNOSIS — G620 Drug-induced polyneuropathy: Secondary | ICD-10-CM | POA: Diagnosis not present

## 2020-09-08 DIAGNOSIS — Z5111 Encounter for antineoplastic chemotherapy: Secondary | ICD-10-CM | POA: Diagnosis not present

## 2020-09-08 DIAGNOSIS — D701 Agranulocytosis secondary to cancer chemotherapy: Secondary | ICD-10-CM | POA: Diagnosis not present

## 2020-09-08 LAB — COMPREHENSIVE METABOLIC PANEL
ALT: 17 U/L (ref 0–44)
AST: 16 U/L (ref 15–41)
Albumin: 3.5 g/dL (ref 3.5–5.0)
Alkaline Phosphatase: 88 U/L (ref 38–126)
Anion gap: 9 (ref 5–15)
BUN: 19 mg/dL (ref 8–23)
CO2: 24 mmol/L (ref 22–32)
Calcium: 8.5 mg/dL — ABNORMAL LOW (ref 8.9–10.3)
Chloride: 105 mmol/L (ref 98–111)
Creatinine, Ser: 0.93 mg/dL (ref 0.61–1.24)
GFR, Estimated: >60 mL/min (ref >60)
Glucose, Bld: 143 mg/dL — ABNORMAL HIGH (ref 70–99)
Potassium: 4.3 mmol/L (ref 3.5–5.1)
Sodium: 138 mmol/L (ref 135–145)
Total Bilirubin: 0.5 mg/dL (ref 0.3–1.2)
Total Protein: 6.6 g/dL (ref 6.5–8.1)

## 2020-09-08 LAB — CBC WITH DIFFERENTIAL/PLATELET
Abs Immature Granulocytes: 0.21 10*3/uL — ABNORMAL HIGH (ref 0.00–0.07)
Basophils Absolute: 0 10*3/uL (ref 0.0–0.1)
Basophils Relative: 2 %
Eosinophils Absolute: 0 10*3/uL (ref 0.0–0.5)
Eosinophils Relative: 0 %
HCT: 25.2 % — ABNORMAL LOW (ref 39.0–52.0)
Hemoglobin: 8.4 g/dL — ABNORMAL LOW (ref 13.0–17.0)
Immature Granulocytes: 16 %
Lymphocytes Relative: 6 %
Lymphs Abs: 0.1 10*3/uL — ABNORMAL LOW (ref 0.7–4.0)
MCH: 32.7 pg (ref 26.0–34.0)
MCHC: 33.3 g/dL (ref 30.0–36.0)
MCV: 98.1 fL (ref 80.0–100.0)
Monocytes Absolute: 0 10*3/uL — ABNORMAL LOW (ref 0.1–1.0)
Monocytes Relative: 2 %
Neutro Abs: 1 10*3/uL — ABNORMAL LOW (ref 1.7–7.7)
Neutrophils Relative %: 74 %
Platelets: 135 10*3/uL — ABNORMAL LOW (ref 150–400)
RBC: 2.57 MIL/uL — ABNORMAL LOW (ref 4.22–5.81)
RDW: 15.8 % — ABNORMAL HIGH (ref 11.5–15.5)
Smear Review: NORMAL
WBC: 1.3 10*3/uL — CL (ref 4.0–10.5)
nRBC: 0 % (ref 0.0–0.2)

## 2020-09-08 LAB — SAMPLE TO BLOOD BANK

## 2020-09-09 ENCOUNTER — Other Ambulatory Visit: Payer: Self-pay

## 2020-09-09 DIAGNOSIS — C8510 Unspecified B-cell lymphoma, unspecified site: Secondary | ICD-10-CM

## 2020-09-12 ENCOUNTER — Other Ambulatory Visit: Payer: Self-pay | Admitting: *Deleted

## 2020-09-12 ENCOUNTER — Telehealth: Payer: Self-pay

## 2020-09-12 ENCOUNTER — Inpatient Hospital Stay: Payer: Medicare HMO

## 2020-09-12 DIAGNOSIS — C8510 Unspecified B-cell lymphoma, unspecified site: Secondary | ICD-10-CM

## 2020-09-12 DIAGNOSIS — C8514 Unspecified B-cell lymphoma, lymph nodes of axilla and upper limb: Secondary | ICD-10-CM | POA: Diagnosis not present

## 2020-09-12 DIAGNOSIS — D6481 Anemia due to antineoplastic chemotherapy: Secondary | ICD-10-CM | POA: Diagnosis not present

## 2020-09-12 DIAGNOSIS — Z5112 Encounter for antineoplastic immunotherapy: Secondary | ICD-10-CM | POA: Diagnosis not present

## 2020-09-12 DIAGNOSIS — G620 Drug-induced polyneuropathy: Secondary | ICD-10-CM | POA: Diagnosis not present

## 2020-09-12 DIAGNOSIS — K1230 Oral mucositis (ulcerative), unspecified: Secondary | ICD-10-CM | POA: Diagnosis not present

## 2020-09-12 DIAGNOSIS — D701 Agranulocytosis secondary to cancer chemotherapy: Secondary | ICD-10-CM | POA: Diagnosis not present

## 2020-09-12 DIAGNOSIS — D6959 Other secondary thrombocytopenia: Secondary | ICD-10-CM | POA: Diagnosis not present

## 2020-09-12 DIAGNOSIS — Z21 Asymptomatic human immunodeficiency virus [HIV] infection status: Secondary | ICD-10-CM | POA: Diagnosis not present

## 2020-09-12 DIAGNOSIS — Z5111 Encounter for antineoplastic chemotherapy: Secondary | ICD-10-CM | POA: Diagnosis not present

## 2020-09-12 LAB — CBC WITH DIFFERENTIAL/PLATELET
Abs Immature Granulocytes: 0.01 10*3/uL (ref 0.00–0.07)
Basophils Absolute: 0 10*3/uL (ref 0.0–0.1)
Basophils Relative: 4 %
Eosinophils Absolute: 0 10*3/uL (ref 0.0–0.5)
Eosinophils Relative: 2 %
HCT: 21.5 % — ABNORMAL LOW (ref 39.0–52.0)
Hemoglobin: 7.2 g/dL — ABNORMAL LOW (ref 13.0–17.0)
Immature Granulocytes: 2 %
Lymphocytes Relative: 10 %
Lymphs Abs: 0.1 10*3/uL — ABNORMAL LOW (ref 0.7–4.0)
MCH: 32.6 pg (ref 26.0–34.0)
MCHC: 33.5 g/dL (ref 30.0–36.0)
MCV: 97.3 fL (ref 80.0–100.0)
Monocytes Absolute: 0 10*3/uL — ABNORMAL LOW (ref 0.1–1.0)
Monocytes Relative: 8 %
Neutro Abs: 0.4 10*3/uL — CL (ref 1.7–7.7)
Neutrophils Relative %: 74 %
Platelets: 41 10*3/uL — ABNORMAL LOW (ref 150–400)
RBC: 2.21 MIL/uL — ABNORMAL LOW (ref 4.22–5.81)
RDW: 14.9 % (ref 11.5–15.5)
Smear Review: NORMAL
WBC: 0.5 10*3/uL — CL (ref 4.0–10.5)
nRBC: 0 % (ref 0.0–0.2)

## 2020-09-12 LAB — SAMPLE TO BLOOD BANK

## 2020-09-12 NOTE — Telephone Encounter (Signed)
Notified by Lia Hopping of Critical WBC=0.5 and ANC=0.4 via secure chat. Per MD: recomment pt to restart cipro 500 mg BID for neutropenia prophylaxis. I will tell him to stip if his next count is ok.   Pt was contacted and states that he has been taking medicaiton for about 3 days becuase he had had sore flare up in his mouth. He voiced understanding on continuing to take medication until told to stop.

## 2020-09-13 DIAGNOSIS — B20 Human immunodeficiency virus [HIV] disease: Secondary | ICD-10-CM

## 2020-09-13 MED ORDER — SULFAMETHOXAZOLE-TRIMETHOPRIM 800-160 MG PO TABS: 1.0000 | ORAL_TABLET | Freq: Every day | ORAL | 2 refills | Status: DC

## 2020-09-14 ENCOUNTER — Encounter: Payer: Self-pay | Admitting: Oncology

## 2020-09-14 ENCOUNTER — Telehealth: Payer: Self-pay

## 2020-09-14 NOTE — Telephone Encounter (Signed)
Patient sent a MyChart message requesting a refill on Allopurinol.    Dr. Tasia Catchings denied the refill on the Allopurinol prescription on 2/14 with reason "plan to discontinue".  Confirmed with MD that pt is to d/c Allopurinol at this time.  Patient notifed via [employer].

## 2020-09-15 ENCOUNTER — Inpatient Hospital Stay: Payer: Medicare HMO

## 2020-09-15 ENCOUNTER — Encounter: Payer: Self-pay | Admitting: Oncology

## 2020-09-15 DIAGNOSIS — Z5111 Encounter for antineoplastic chemotherapy: Secondary | ICD-10-CM | POA: Diagnosis not present

## 2020-09-15 DIAGNOSIS — K1230 Oral mucositis (ulcerative), unspecified: Secondary | ICD-10-CM | POA: Diagnosis not present

## 2020-09-15 DIAGNOSIS — D701 Agranulocytosis secondary to cancer chemotherapy: Secondary | ICD-10-CM | POA: Diagnosis not present

## 2020-09-15 DIAGNOSIS — Z21 Asymptomatic human immunodeficiency virus [HIV] infection status: Secondary | ICD-10-CM | POA: Diagnosis not present

## 2020-09-15 DIAGNOSIS — D6959 Other secondary thrombocytopenia: Secondary | ICD-10-CM | POA: Diagnosis not present

## 2020-09-15 DIAGNOSIS — Z5112 Encounter for antineoplastic immunotherapy: Secondary | ICD-10-CM | POA: Diagnosis not present

## 2020-09-15 DIAGNOSIS — G620 Drug-induced polyneuropathy: Secondary | ICD-10-CM | POA: Diagnosis not present

## 2020-09-15 DIAGNOSIS — C8510 Unspecified B-cell lymphoma, unspecified site: Secondary | ICD-10-CM

## 2020-09-15 DIAGNOSIS — C8514 Unspecified B-cell lymphoma, lymph nodes of axilla and upper limb: Secondary | ICD-10-CM | POA: Diagnosis not present

## 2020-09-15 DIAGNOSIS — D6481 Anemia due to antineoplastic chemotherapy: Secondary | ICD-10-CM | POA: Diagnosis not present

## 2020-09-15 LAB — CBC WITH DIFFERENTIAL/PLATELET
Abs Immature Granulocytes: 1.28 10*3/uL — ABNORMAL HIGH (ref 0.00–0.07)
Basophils Absolute: 0.1 10*3/uL (ref 0.0–0.1)
Basophils Relative: 1 %
Eosinophils Absolute: 0.1 10*3/uL (ref 0.0–0.5)
Eosinophils Relative: 1 %
HCT: 23 % — ABNORMAL LOW (ref 39.0–52.0)
Hemoglobin: 7.6 g/dL — ABNORMAL LOW (ref 13.0–17.0)
Immature Granulocytes: 24 %
Lymphocytes Relative: 6 %
Lymphs Abs: 0.3 10*3/uL — ABNORMAL LOW (ref 0.7–4.0)
MCH: 32.8 pg (ref 26.0–34.0)
MCHC: 33 g/dL (ref 30.0–36.0)
MCV: 99.1 fL (ref 80.0–100.0)
Monocytes Absolute: 1 10*3/uL (ref 0.1–1.0)
Monocytes Relative: 20 %
Neutro Abs: 2.5 10*3/uL (ref 1.7–7.7)
Neutrophils Relative %: 48 %
Platelets: 51 10*3/uL — ABNORMAL LOW (ref 150–400)
RBC: 2.32 MIL/uL — ABNORMAL LOW (ref 4.22–5.81)
RDW: 16.4 % — ABNORMAL HIGH (ref 11.5–15.5)
WBC: 5.3 10*3/uL (ref 4.0–10.5)
nRBC: 3 % — ABNORMAL HIGH (ref 0.0–0.2)

## 2020-09-15 LAB — SAMPLE TO BLOOD BANK

## 2020-09-19 ENCOUNTER — Encounter: Payer: Self-pay | Admitting: Oncology

## 2020-09-19 ENCOUNTER — Inpatient Hospital Stay: Payer: Medicare HMO

## 2020-09-19 ENCOUNTER — Telehealth: Payer: Self-pay

## 2020-09-19 ENCOUNTER — Other Ambulatory Visit: Payer: Self-pay

## 2020-09-19 ENCOUNTER — Inpatient Hospital Stay (HOSPITAL_BASED_OUTPATIENT_CLINIC_OR_DEPARTMENT_OTHER): Payer: Medicare HMO | Admitting: Oncology

## 2020-09-19 VITALS — BP 107/65 | HR 80 | Temp 96.8°F | Resp 16 | Wt 167.2 lb

## 2020-09-19 VITALS — BP 112/85 | HR 85 | Temp 97.1°F

## 2020-09-19 DIAGNOSIS — Z5112 Encounter for antineoplastic immunotherapy: Secondary | ICD-10-CM | POA: Diagnosis not present

## 2020-09-19 DIAGNOSIS — G620 Drug-induced polyneuropathy: Secondary | ICD-10-CM | POA: Diagnosis not present

## 2020-09-19 DIAGNOSIS — D701 Agranulocytosis secondary to cancer chemotherapy: Secondary | ICD-10-CM | POA: Diagnosis not present

## 2020-09-19 DIAGNOSIS — C8510 Unspecified B-cell lymphoma, unspecified site: Secondary | ICD-10-CM

## 2020-09-19 DIAGNOSIS — C8514 Unspecified B-cell lymphoma, lymph nodes of axilla and upper limb: Secondary | ICD-10-CM | POA: Diagnosis not present

## 2020-09-19 DIAGNOSIS — B20 Human immunodeficiency virus [HIV] disease: Secondary | ICD-10-CM

## 2020-09-19 DIAGNOSIS — Z21 Asymptomatic human immunodeficiency virus [HIV] infection status: Secondary | ICD-10-CM | POA: Diagnosis not present

## 2020-09-19 DIAGNOSIS — T451X5A Adverse effect of antineoplastic and immunosuppressive drugs, initial encounter: Secondary | ICD-10-CM

## 2020-09-19 DIAGNOSIS — D6959 Other secondary thrombocytopenia: Secondary | ICD-10-CM | POA: Diagnosis not present

## 2020-09-19 DIAGNOSIS — C8512 Unspecified B-cell lymphoma, intrathoracic lymph nodes: Secondary | ICD-10-CM | POA: Diagnosis not present

## 2020-09-19 DIAGNOSIS — D6481 Anemia due to antineoplastic chemotherapy: Secondary | ICD-10-CM

## 2020-09-19 DIAGNOSIS — K1230 Oral mucositis (ulcerative), unspecified: Secondary | ICD-10-CM | POA: Diagnosis not present

## 2020-09-19 DIAGNOSIS — Z5111 Encounter for antineoplastic chemotherapy: Secondary | ICD-10-CM

## 2020-09-19 LAB — CBC WITH DIFFERENTIAL/PLATELET
Abs Immature Granulocytes: 0.89 10*3/uL — ABNORMAL HIGH (ref 0.00–0.07)
Basophils Absolute: 0 10*3/uL (ref 0.0–0.1)
Basophils Relative: 1 %
Eosinophils Absolute: 0.1 10*3/uL (ref 0.0–0.5)
Eosinophils Relative: 1 %
HCT: 25.3 % — ABNORMAL LOW (ref 39.0–52.0)
Hemoglobin: 8 g/dL — ABNORMAL LOW (ref 13.0–17.0)
Immature Granulocytes: 15 %
Lymphocytes Relative: 10 %
Lymphs Abs: 0.6 10*3/uL — ABNORMAL LOW (ref 0.7–4.0)
MCH: 32.9 pg (ref 26.0–34.0)
MCHC: 31.6 g/dL (ref 30.0–36.0)
MCV: 104.1 fL — ABNORMAL HIGH (ref 80.0–100.0)
Monocytes Absolute: 0.7 10*3/uL (ref 0.1–1.0)
Monocytes Relative: 11 %
Neutro Abs: 3.8 10*3/uL (ref 1.7–7.7)
Neutrophils Relative %: 62 %
Platelets: 183 10*3/uL (ref 150–400)
RBC: 2.43 MIL/uL — ABNORMAL LOW (ref 4.22–5.81)
RDW: 18.7 % — ABNORMAL HIGH (ref 11.5–15.5)
Smear Review: NORMAL
WBC: 6.1 10*3/uL (ref 4.0–10.5)
nRBC: 1.5 % — ABNORMAL HIGH (ref 0.0–0.2)

## 2020-09-19 LAB — COMPREHENSIVE METABOLIC PANEL
ALT: 16 U/L (ref 0–44)
AST: 23 U/L (ref 15–41)
Albumin: 3.4 g/dL — ABNORMAL LOW (ref 3.5–5.0)
Alkaline Phosphatase: 109 U/L (ref 38–126)
Anion gap: 10 (ref 5–15)
BUN: 18 mg/dL (ref 8–23)
CO2: 22 mmol/L (ref 22–32)
Calcium: 8.6 mg/dL — ABNORMAL LOW (ref 8.9–10.3)
Chloride: 107 mmol/L (ref 98–111)
Creatinine, Ser: 1.07 mg/dL (ref 0.61–1.24)
GFR, Estimated: >60 mL/min (ref >60)
Glucose, Bld: 95 mg/dL (ref 70–99)
Potassium: 4 mmol/L (ref 3.5–5.1)
Sodium: 139 mmol/L (ref 135–145)
Total Bilirubin: 0.4 mg/dL (ref 0.3–1.2)
Total Protein: 6.4 g/dL — ABNORMAL LOW (ref 6.5–8.1)

## 2020-09-19 MED ORDER — GABAPENTIN 100 MG PO CAPS: 100.0000 mg | ORAL_CAPSULE | Freq: Three times a day (TID) | ORAL | 1 refills | Status: DC

## 2020-09-19 MED ORDER — SODIUM CHLORIDE 0.9 % IV SOLN
375.0000 mg/m2 | Freq: Once | INTRAVENOUS | Status: AC
  Administered 2020-09-19: 700 mg via INTRAVENOUS
  Filled 2020-09-19: qty 50

## 2020-09-19 MED ORDER — VINCRISTINE SULFATE CHEMO INJECTION 1 MG/ML
Freq: Once | INTRAVENOUS | Status: DC
  Filled 2020-09-19: qty 11

## 2020-09-19 MED ORDER — SODIUM CHLORIDE 0.9 % IV SOLN
Freq: Once | INTRAVENOUS | Status: AC
  Filled 2020-09-19: qty 250

## 2020-09-19 MED ORDER — ACETAMINOPHEN 325 MG PO TABS
650.0000 mg | ORAL_TABLET | Freq: Once | ORAL | Status: AC
  Administered 2020-09-19: 650 mg via ORAL
  Filled 2020-09-19: qty 2

## 2020-09-19 MED ORDER — DIPHENHYDRAMINE HCL 25 MG PO CAPS
50.0000 mg | ORAL_CAPSULE | Freq: Once | ORAL | Status: AC
  Administered 2020-09-19: 50 mg via ORAL
  Filled 2020-09-19: qty 2

## 2020-09-19 MED ORDER — SODIUM CHLORIDE 0.9% FLUSH
10.0000 mL | Freq: Once | INTRAVENOUS | Status: AC
  Administered 2020-09-19: 10 mL via INTRAVENOUS
  Filled 2020-09-19: qty 10

## 2020-09-19 MED ORDER — SODIUM CHLORIDE 0.9 % IV SOLN
20.0000 mg | Freq: Once | INTRAVENOUS | Status: AC
  Administered 2020-09-19: 20 mg via INTRAVENOUS
  Filled 2020-09-19: qty 20

## 2020-09-19 MED ORDER — PALONOSETRON HCL INJECTION 0.25 MG/5ML
0.2500 mg | Freq: Once | INTRAVENOUS | Status: AC
  Administered 2020-09-19: 0.25 mg via INTRAVENOUS
  Filled 2020-09-19: qty 5

## 2020-09-19 NOTE — Telephone Encounter (Signed)
Done...  Pts appts has been as requested Pt was notified and made aware of all of his sched appts

## 2020-09-19 NOTE — Progress Notes (Signed)
Hematology/Oncology follow up note [facility] Telephone:([phone]) [phone] [phone]   Patient Care Team: Olin Hauser, DO as PCP - General (Family Medicine) Earlie Server, MD as Consulting Physician (Hematology and Oncology)  REFERRING PROVIDER: Nobie Putnam *  CHIEF COMPLAINTS/REASON FOR VISIT:  Follow up for axillary mass  HISTORY OF PRESENTING ILLNESS:   Justin Wallace is a  75 y.o.  male with PMH listed below was seen in consultation at the request of  Nobie Putnam *  for evaluation of symptomatic anemia Patient reported history of sudden onset of diffuse body aches, decreased appetite, headache low-grade fever, profound weakness/fatigue about 2 weeks ago.  Prior to the onset of symptoms, he went to a funeral as well as applicable clinic.  He reports that he has received COVID-19 vaccination previously.  He moved from [place].  Daughter is an Therapist, sports and told him to get COVID-19 PCR checked and he was tested negative. Patient also reports a sudden drop of weight since the onset of symptoms.  Patient has a history of celiac disease.  No colonoscopy records or pathology records in current EMR.  Diagnosis was done by [facility].  Patient reports that he was in his usual state of health until the onset of symptoms.  03/10/2020, patient was evaluated by primary care provider.  Blood work showed mild anemia with hemoglobin of 13.1, Iron panel showed decreased saturation of 8, ferritin 218, TIBC 291, patient was referred to hematology for evaluation of symptomatic anemia and iron deficiency.  Patient reports that her body aches, fever and headache symptoms have improved.  However he continues to feel very tired and fatigued.  Appetite has decreased and weight remains low.  # Physical examination showed left axillary mass  ultrasound confirmed a left axillary large soft tissue mass up to 6.8 cm with internal vascularity. 03/30/2020  CT chest with contrast showed soft tissue mass/enlarged lymph node in the left axilla 6.3 cm, concerning for primary mass or metastatic lymphadenopathy.  There are additional prominent although much smaller right axillary lymph nodes, pretracheal and superior mediastinal lymph nodes.  Findings are concerning for malignancy such as lymphoma or nodal metastatic disease.  # treated for Lymes disease, finished doxycycline 162m BID for 21 days course. 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 he has started treatment with Biktarvy, also started on Bactrim daily.  04/26/2020 PET scan showed large intensely hypermetabolic left axillary mass SUV 28.  Additional small bilateral axilla lymph node with mild metabolic activity SUV 2.  Metabolic activity through the porta hepatis region without enlarged nodes identified.  No bone lesions. 05/02/2020 bone marrow biopsy showed hypercellular marrow for age with trilineage hematopoiesis.  Increased number of CD10 positive B cells, cannot rule out minimal or subtle involvement of marrow by the high-grade B-cell lymphoma. Normal cytogenetics. 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  Positive for BCL6 and MYC expression.  FISH testing showed negative for BCL-2/BCL6/MYC gene rearrangement.   Trisomy 3 and 18 were identified and are nonspecific findings seen in clonal lymphoid neoplasm.  Final results were signed out on 05/30/2020-due to the delay of  FISH results.  B sed on the FISH results, lymphoma is best classified as high-grade B-cell lymphoma not otherwise specified.  It does not meet morphological criteria for diffuse large B-cell lymphoma nor molecular criteria for Burkitt lymphoma.  11 q. alteration testing has been sent.  Results are pending. 04/26/2020, MUGA testing showed normal  LVEF of 56% with normal LV wall motion.  05/13/2020, echocardiogram showed low normal end ejection fraction of 50 to  55%.  #04/19/2020 Mediport placed by Dr. Lucky Cowboy  INTERVAL HISTORY Justin Wallace is a 75 y.o. male who has above history reviewed by me today presents for follow up visit for high-grade B-cell lymphoma and management of chemotherapy associated complications. Problems and complaints are listed below: On dose adjusted R-EPOCH treatment.   Overall he tolerates well.  ANC dropped below 0.5 on 1 occasion during the interval.  No fever chill nausea vomiting diarrhea. Patient complaint worsening of numbness tingling of fingertips and toes.   Review of Systems  Constitutional: Positive for fatigue. Negative for appetite change, chills, fever and unexpected weight change.  HENT:   Negative for hearing loss, mouth sores and voice change.   Eyes: Negative for eye problems and icterus.  Respiratory: Negative for chest tightness, cough and shortness of breath.   Cardiovascular: Negative for chest pain and leg swelling.  Gastrointestinal: Negative for abdominal distention and abdominal pain.  Endocrine: Negative for hot flashes.  Genitourinary: Negative for difficulty urinating, dysuria and frequency.   Musculoskeletal: Negative for arthralgias.  Skin: Negative for itching and rash.  Neurological: Positive for numbness. Negative for light-headedness.  Hematological: Negative for adenopathy. Does not bruise/bleed easily.  Psychiatric/Behavioral: Negative for confusion.    MEDICAL HISTORY:  Past Medical History:  Diagnosis Date  . Celiac disease   . 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 HIV (human immunodeficiency virus infection) ([place])   . Lyme disease   . Lymphoma ([place])     SURGICAL HISTORY: Past Surgical History:  Procedure Laterality Date  . PORTA CATH INSERTION N/A 04/25/2020   Procedure: PORTA CATH INSERTION;  Surgeon: Algernon Huxley, MD;  Location: [facility];  Service: Cardiovascular;  Laterality: N/A;    SOCIAL HISTORY: Social History   Socioeconomic History  . Marital  status: Married    Spouse name: Opal Sidles   . Number of children: 2  . Years of education: Not on file  . Highest education level: Not on file  Occupational History  . Not on file  Tobacco Use  . Smoking status: Never Smoker  . Smokeless tobacco: Never Used  Vaping Use  . Vaping Use: Never used  Substance and Sexual Activity  . Alcohol use: Yes    Comment: occasional  . Drug use: Never  . Sexual activity: Never  Other Topics Concern  . Not on file  Social History Narrative  . Not on file   Social Determinants of Health   Financial Resource Strain: Not on file  Food Insecurity: Not on file  Transportation Needs: Not on file  Physical Activity: Not on file  Stress: Not on file  Social Connections: Not on file  Intimate Partner Violence: Not on file    FAMILY HISTORY: Family History  Problem Relation Age of Onset  . Hyperlipidemia Mother   . Brain cancer Father     ALLERGIES:  has No Known Allergies.  MEDICATIONS:  Current Outpatient Medications  Medication Sig Dispense Refill  . acyclovir (ZOVIRAX) 400 MG tablet Take 1 tablet (400 mg total) by mouth 2 (two) times daily. 60 tablet 5  . allopurinol (ZYLOPRIM) 300 MG tablet Take 1 tablet (300 mg total) by mouth 2 (two) times daily. 180 tablet 0  . bictegravir-emtricitabine-tenofovir AF (BIKTARVY) 50-200-25 MG TABS tablet Take 1 tablet by mouth daily. 30 tablet 5  . chlorhexidine (PERIDEX) 0.12 % solution  Use as directed 15 mLs in the mouth or throat 2 (two) times daily. 473 mL 0  . ciprofloxacin (CIPRO) 500 MG tablet Take 1 tablet (500 mg total) by mouth 2 (two) times daily. 30 tablet 0  . hydrocortisone (ANUSOL-HC) 2.5 % rectal cream Place 1 application rectally 2 (two) times daily. For up to 1-2 weeks. As needed hemorrhoids 30 g 2  . lidocaine-prilocaine (EMLA) cream Apply to affected area once 30 g 3  . magic mouthwash w/lidocaine SOLN Take 5 mLs by mouth 4 (four) times daily as needed for mouth pain. Sig: Swish/spit 5-10  ml four times a day as needed 480 mL 1  . ondansetron (ZOFRAN) 8 MG tablet Take 1 tablet (8 mg total) by mouth 2 (two) times daily as needed for refractory nausea / vomiting. Start on the third day after chemotherapy. 30 tablet 1  . polyethylene glycol powder (GLYCOLAX/MIRALAX) 17 GM/SCOOP powder Take by mouth as needed.    . predniSONE (DELTASONE) 50 MG tablet Take 2 tablets on Day 2, 3, 4, 5 of each chemotherapy treatments. 8 tablet 2  . prochlorperazine (COMPAZINE) 10 MG tablet Take 1 tablet (10 mg total) by mouth every 6 (six) hours as needed (Nausea or vomiting). 30 tablet 1  . sulfamethoxazole-trimethoprim (BACTRIM DS) 800-160 MG tablet Take 1 tablet by mouth daily. 30 tablet 2   No current facility-administered medications for this visit.   Facility-Administered Medications Ordered in Other Visits  Medication Dose Route Frequency Provider Last Rate Last Admin  . methotrexate (PF) 12 mg in sodium chloride (PF) 0.9 % INTRATHECAL chemo injection   Intrathecal Once Earlie Server, MD      . methotrexate (PF) 12 mg in sodium chloride (PF) 0.9 % INTRATHECAL chemo injection   Intrathecal Once Earlie Server, MD         PHYSICAL EXAMINATION: ECOG PERFORMANCE STATUS: 1 - Symptomatic but completely ambulatory Vitals:   09/19/20 0848  BP: 107/65  Pulse: 80  Resp: 16  Temp: (!) 96.8 F (36 C)   Filed Weights   09/19/20 0848  Weight: 167 lb 3.2 oz (75.8 kg)    Physical Exam Constitutional:      General: He is not in acute distress. HENT:     Head: Normocephalic and atraumatic.  Eyes:     General: No scleral icterus. Cardiovascular:     Rate and Rhythm: Normal rate and regular rhythm.     Heart sounds: Normal heart sounds.  Pulmonary:     Effort: Pulmonary effort is normal. No respiratory distress.     Breath sounds: No wheezing.  Abdominal:     General: Bowel sounds are normal. There is no distension.     Palpations: Abdomen is soft.  Musculoskeletal:        General: No deformity.  Normal range of motion.     Cervical back: Normal range of motion and neck supple.     Comments: Left axillary mass is no longer palpable  Skin:    General: Skin is warm and dry.     Findings: No erythema or rash.  Neurological:     Mental Status: He is alert and oriented to person, place, and time. Mental status is at baseline.     Cranial Nerves: No cranial nerve deficit.     Coordination: Coordination normal.  Psychiatric:        Mood and Affect: Mood normal.       LABORATORY DATA:  I have reviewed the data as listed Lab  Results  Component Value Date   WBC 5.3 09/15/2020   HGB 7.6 (L) 09/15/2020   HCT 23.0 (L) 09/15/2020   MCV 99.1 09/15/2020   PLT 51 (L) 09/15/2020   Recent Labs    03/10/20 1113 03/17/20 1029 05/05/20 0956 06/06/20 0822 08/25/20 1109 08/29/20 0818 09/08/20 1317  NA 138 137 139   < > 137 134* 138  K 4.8 3.8 4.4   < > 4.0 3.8 4.3  CL 106 104 107   < > 103 104 105  CO2 27 27 27    < > 26 23 24   GLUCOSE 94 97 97   < > 95 89 143*  BUN 23 21 19    < > 18 15 19   CREATININE 0.91 1.09 0.88   < > 1.04 0.88 0.93  CALCIUM 8.4* 8.1* 9.1   < > 9.0 8.2* 8.5*  GFRNONAA 84 >60 86   < > >60 >60 >60  GFRAA 98 >60 100  --   --   --   --   PROT 6.8 8.0 7.4   < > 6.7 6.0* 6.6  ALBUMIN  --  2.7*  --    < > 3.4* 3.1* 3.5  AST 20 24 15    < > 17 21 16   ALT 13 18 8*   < > 22 16 17   ALKPHOS  --  140*  --    < > 90 105 88  BILITOT 0.4 0.3 0.5   < > 0.5 0.2* 0.5   < > = values in this interval not displayed.   Iron/TIBC/Ferritin/ %Sat    Component Value Date/Time   IRON 22 (L) 03/17/2020 1029   IRON 17 02/17/2018 0000   TIBC 286 03/17/2020 1029   TIBC 425 02/17/2018 0000   FERRITIN 284 03/17/2020 1029   IRONPCTSAT 8 (L) 03/17/2020 1029   IRONPCTSAT 8 (L) 03/10/2020 1113      RADIOGRAPHIC STUDIES: I have personally reviewed the radiological images as listed and agreed with the findings in the report. DG FLUORO GUIDED LOC OF NEEDLE/CATH TIP FOR SPINAL INJECT  LT  Result Date: 09/07/2020 CLINICAL DATA:  Intrathecal methotrexate for high-grade B-cell lymphoma EXAM: DIAGNOSTIC LUMBAR PUNCTURE UNDER FLUOROSCOPIC GUIDANCE FLUOROSCOPY TIME:  Fluoroscopy Time:  0.2 minute Radiation Exposure Index (if provided by the fluoroscopic device): 0.2 mGy Number of Acquired Spot Images: 0 PROCEDURE: Informed consent was obtained from the patient prior to the procedure, including potential complications of headache, allergy, and pain. With the patient prone, the lower back was prepped with Betadine. 1% Lidocaine was used for local anesthesia. Lumbar puncture was performed at the L2-3 level using a 22 gauge needle with return of clear CSF. 12 mg of methotrexate was injected into the subarachnoid space. The patient tolerated the procedure well and there were no apparent complications. No immediate complications. IMPRESSION: Successful fluoroscopic guided lumbar puncture for intrathecal chemotherapy. Electronically Signed   By: Kathreen Devoid   On: 09/07/2020 12:07   NM PET Image Restage (PS) Skull Base to Thigh  Result Date: 08/03/2020 CLINICAL DATA:  Subsequent treatment strategy for high-grade lymphoma. B-cell lymphoma. EXAM: NUCLEAR MEDICINE PET SKULL BASE TO THIGH TECHNIQUE: 8.8 mCi F-18 FDG was injected intravenously. Full-ring PET imaging was performed from the skull base to thigh after the radiotracer. CT data was obtained and used for attenuation correction and anatomic localization. Fasting blood glucose: 93 mg/dl COMPARISON:  None. FINDINGS: Mediastinal blood pool activity: SUV max 1.6 Liver activity: SUV max 2.6 NECK: No  hypermetabolic lymph nodes in the neck. Incidental CT findings: none CHEST: Differing positioning of the arms compared to prior. The large LEFT axillary mass seen on comparison exam is reduced in size and metabolic activity measuring 2.0 x 1.2 cm compared to 5.0 x 2.8 cm. The activity of the mass is markedly reduced with SUV max equal 2.5 decreased from SUV max  equal 28.1. The position of the axillary mass is change related to arm positioning. No additional hypermetabolic axillary nodes. No hypermetabolic mediastinal nodes. Incidental CT findings: No suspicious pulmonary nodules. Port in the anterior chest wall with tip in distal SVC. ABDOMEN/PELVIS: Spleen is increased in metabolic activity compared to prior with SUV max 4.1. Spleen is normal size and not changed from prior. Favor increased metabolic activity related to GCSF type response. No hypermetabolic abdominopelvic lymph nodes. No enlarged abdominopelvic nodes. Normal liver. There is diffuse activity within the gastric wall which is intense with SUV max equal 8.2. Incidental CT findings: None SKELETON: Diffuse uniform intense activity within the entire marrow space consistent GCSF type response. Incidental CT findings: none IMPRESSION: 1. Marked reduction in size and metabolic activity of the LEFT axillary lymph node/mass with mild residual metabolic activity ( [place] 3). 2. No new hypermetabolic lymph nodes on skull base to thigh scan. 3. Increased metabolic activity normal volume spleen is favored GCSF S type response. 4. Intense hypermetabolic activity throughout the stomach is favor gastritis. 5. Uniform increase in marrow activity consistent wit GCSF type response. Electronically Signed   By: Suzy Bouchard M.D.   On: 08/03/2020 12:17   DG FLUORO GUIDED LOC OF NEEDLE/CATH TIP FOR SPINAL INJECT LT  Result Date: 09/07/2020 CLINICAL DATA:  Intrathecal methotrexate for high-grade B-cell lymphoma EXAM: DIAGNOSTIC LUMBAR PUNCTURE UNDER FLUOROSCOPIC GUIDANCE FLUOROSCOPY TIME:  Fluoroscopy Time:  0.2 minute Radiation Exposure Index (if provided by the fluoroscopic device): 0.2 mGy Number of Acquired Spot Images: 0 PROCEDURE: Informed consent was obtained from the patient prior to the procedure, including potential complications of headache, allergy, and pain. With the patient prone, the lower back was prepped  with Betadine. 1% Lidocaine was used for local anesthesia. Lumbar puncture was performed at the L2-3 level using a 22 gauge needle with return of clear CSF. 12 mg of methotrexate was injected into the subarachnoid space. The patient tolerated the procedure well and there were no apparent complications. No immediate complications. IMPRESSION: Successful fluoroscopic guided lumbar puncture for intrathecal chemotherapy. Electronically Signed   By: Kathreen Devoid   On: 09/07/2020 12:07   DG FLUORO GUIDED LOC OF NEEDLE/CATH TIP FOR SPINAL INJECT LT  Result Date: 08/17/2020 CLINICAL DATA:  High-grade B-cell lymphoma, patient presents for intrathecal methotrexate EXAM: DIAGNOSTIC LUMBAR PUNCTURE UNDER FLUOROSCOPIC GUIDANCE FLUOROSCOPY TIME:  Fluoroscopy Time:  0.1 minute Radiation Exposure Index (if provided by the fluoroscopic device): 0.1 mGy Number of Acquired Spot Images: 0 PROCEDURE: Informed consent was obtained from the patient prior to the procedure, including potential complications of headache, allergy, and pain. With the patient prone, the lower back was prepped with Betadine. 1% Lidocaine was used for local anesthesia. Lumbar puncture was performed at the L3-4 level using a 22 gauge needle with return of clear CSF. 12 mg of methotrexate was injected into the subarachnoid space. The patient tolerated the procedure well and there were no apparent complications. No immediate complications. IMPRESSION: Successful fluoroscopic guided lumbar puncture for intrathecal chemotherapy. Electronically Signed   By: Kathreen Devoid   On: 08/17/2020 11:23   DG FLUORO GUIDED LOC  OF NEEDLE/CATH TIP FOR SPINAL INJECT LT  Result Date: 07/27/2020 CLINICAL DATA:  High-grade B-cell lymphoma. Patient presents for intrathecal methotrexate injection. EXAM: DIAGNOSTIC LUMBAR PUNCTURE UNDER FLUOROSCOPIC GUIDANCE FLUOROSCOPY TIME:  Fluoroscopy Time:  0.1 minute Radiation Exposure Index (if provided by the fluoroscopic device): 0.2 mGy  Number of Acquired Spot Images: 0 PROCEDURE: Informed consent was obtained from the patient prior to the procedure, including potential complications of headache, allergy, and pain. With the patient prone, the lower back was prepped with Betadine. 1% Lidocaine was used for local anesthesia. Lumbar puncture was performed at the L3-4 level using a 22 gauge needle with return of clear CSF . 3 ml of CSF were obtained for laboratory studies. 12 mg of methotrexate was injected into the subarachnoid space. The patient tolerated the procedure well and there were no apparent complications. No immediate complications. IMPRESSION: Successful fluoroscopic guided lumbar puncture for intrathecal chemotherapy. Electronically Signed   By: Kathreen Devoid   On: 07/27/2020 10:37   DG FLUORO GUIDED LOC OF NEEDLE/CATH TIP FOR SPINAL INJECT LT  Result Date: 07/05/2020 CLINICAL DATA:  High-grade B-cell lymphoma. EXAM: FLUOROSCOPICALLY GUIDED LUMBAR PUNCTURE FOR INTRATHECAL CHEMOTHERAPY FLUOROSCOPY TIME:  4 minutes 30 seconds fluoroscopy time. Radiation dose 69.4 mGy. PROCEDURE: Informed consent was obtained from the patient prior to the procedure, including potential complications of headache, allergy, and pain. With the patient prone, the lower back was prepped with Betadine. 1% Lidocaine was used for local anesthesia. Lumbar puncture was performed at the L3-L4 level using a gauge needle with return of clearCSF. Confirmation of intrathecal needle tip location was performed by administering 3 cc of Omnipaque 180. 12 mg of methotrexate supplied by pharmacy. 12 mg of methotrexate was injected into the subarachnoid space. The patient tolerated the procedure well without apparent complication. IMPRESSION: Successful intrathecal methotrexate administration. Electronically Signed   By: Marcello Moores  Register   On: 07/05/2020 11:03      ASSESSMENT & PLAN:  1. 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 Chemotherapy-induced neutropenia ([facility])   3.  Anemia due to antineoplastic chemotherapy   4. Encounter for antineoplastic chemotherapy   5. 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 IHC MYC+, BCL 6+, negative translocation.  Left axillary mass final pathology is high-grade B-cell lymphoma, nonspecific type.   Status post 4 cycles of dose adjusted EPOCH with rituximab.  Patient had +1 dose level increase for cycle 2/cycle 3, +2 dose level increase for cycle 4, +1 dose level for cycle 5. Overall tolerates well Labs reviewed and discussed with patient Counts acceptable to proceed with cycle 6 chemotherapy. +1 dose level.  Except dose reduce vincristine due to neuropathy Patient will proceed with intrathecal methotrexate next week. Plan to repeat PET scan 10 weeks after chemotherapy.  #Grade 2 neuropathy, recommend patient to start gabapentin 100 mg 3 times daily.  Potential side effects were discussed with him. #Chemotherapy-induced neutropenia, resolved. ANC is 3.8 today.  Patient will do follow-up CBCs to monitor counts. #Chemotherapy-induced anemia, hemoglobin 8 today.  Monitor.  #Chemotherapy-induced thrombocytopenia, platelet count has normalized.  #Mucositis/ stomatitis, improved,. continue prophylactic Magic mouthwash  Repeat CBC/ hold tube 3/3, 3/8, 3/10,    Prophylaxis -Continue Bactrim every other day for PCP prophylaxis -Continue acyclovir 400 mg twice daily -Patient has been on allopurinol for tumor lysis prophylaxis.  He has responded very well and tumor burden has significantly  decreased.  Discontinue allopurinol at this point.  # AIDS patient follows with ID physician.  Has been on antiretroviral therapy with Biktarvy .   Supportive care measures are necessary for patient well-being and will be provided as necessary. We spent sufficient time to discuss many aspect of  care, questions were answered to patient and wife's satisfaction.     All questions were answered. The patient knows to call the clinic with any problems questions or concerns.  cc Nobie Putnam *  Follow-up in  3 weeks.   Earlie Server, MD, PhD Hematology Oncology [facility] Pager- [phone] 09/19/2020

## 2020-09-19 NOTE — Telephone Encounter (Signed)
Patient notified via Mychart or MD recommendations. Please schedule pt as requested and notify him of appts.

## 2020-09-19 NOTE — Telephone Encounter (Signed)
-----   Message from Earlie Server, MD sent at 09/19/2020  3:14 PM EST ----- Please arrange him to port flush in 8 weeks.  Also PET restaging in 10-12 weeks.  Follow up lab md cbc cmp ldh -ordered. MD after PET

## 2020-09-19 NOTE — Addendum Note (Signed)
Addended by: Earlie Server on: 09/19/2020 03:17 PM   Modules accepted: Orders

## 2020-09-19 NOTE — Progress Notes (Signed)
Neuropathy in feet seems to be getting worse and starting to have neuropathy feeling in fingertips.

## 2020-09-20 ENCOUNTER — Inpatient Hospital Stay: Payer: Medicare HMO

## 2020-09-20 VITALS — BP 129/65 | HR 78 | Temp 96.8°F

## 2020-09-20 DIAGNOSIS — D6959 Other secondary thrombocytopenia: Secondary | ICD-10-CM | POA: Diagnosis not present

## 2020-09-20 DIAGNOSIS — Z5111 Encounter for antineoplastic chemotherapy: Secondary | ICD-10-CM | POA: Diagnosis not present

## 2020-09-20 DIAGNOSIS — G620 Drug-induced polyneuropathy: Secondary | ICD-10-CM | POA: Diagnosis not present

## 2020-09-20 DIAGNOSIS — Z21 Asymptomatic human immunodeficiency virus [HIV] infection status: Secondary | ICD-10-CM | POA: Diagnosis not present

## 2020-09-20 DIAGNOSIS — K1230 Oral mucositis (ulcerative), unspecified: Secondary | ICD-10-CM | POA: Diagnosis not present

## 2020-09-20 DIAGNOSIS — D6481 Anemia due to antineoplastic chemotherapy: Secondary | ICD-10-CM | POA: Diagnosis not present

## 2020-09-20 DIAGNOSIS — C8512 Unspecified B-cell lymphoma, intrathoracic lymph nodes: Secondary | ICD-10-CM | POA: Diagnosis not present

## 2020-09-20 DIAGNOSIS — Z5112 Encounter for antineoplastic immunotherapy: Secondary | ICD-10-CM | POA: Diagnosis not present

## 2020-09-20 DIAGNOSIS — C8514 Unspecified B-cell lymphoma, lymph nodes of axilla and upper limb: Secondary | ICD-10-CM | POA: Diagnosis not present

## 2020-09-20 DIAGNOSIS — D701 Agranulocytosis secondary to cancer chemotherapy: Secondary | ICD-10-CM | POA: Diagnosis not present

## 2020-09-20 DIAGNOSIS — C8510 Unspecified B-cell lymphoma, unspecified site: Secondary | ICD-10-CM

## 2020-09-20 MED ORDER — VINCRISTINE SULFATE CHEMO INJECTION 1 MG/ML
Freq: Once | INTRAVENOUS | Status: DC
  Filled 2020-09-20: qty 11

## 2020-09-21 ENCOUNTER — Inpatient Hospital Stay: Payer: Medicare HMO

## 2020-09-21 ENCOUNTER — Other Ambulatory Visit: Payer: Self-pay

## 2020-09-21 VITALS — BP 114/68 | HR 68 | Temp 98.7°F

## 2020-09-21 DIAGNOSIS — D6959 Other secondary thrombocytopenia: Secondary | ICD-10-CM | POA: Diagnosis not present

## 2020-09-21 DIAGNOSIS — C8514 Unspecified B-cell lymphoma, lymph nodes of axilla and upper limb: Secondary | ICD-10-CM | POA: Diagnosis not present

## 2020-09-21 DIAGNOSIS — C8512 Unspecified B-cell lymphoma, intrathoracic lymph nodes: Secondary | ICD-10-CM | POA: Diagnosis not present

## 2020-09-21 DIAGNOSIS — Z21 Asymptomatic human immunodeficiency virus [HIV] infection status: Secondary | ICD-10-CM | POA: Diagnosis not present

## 2020-09-21 DIAGNOSIS — D701 Agranulocytosis secondary to cancer chemotherapy: Secondary | ICD-10-CM | POA: Diagnosis not present

## 2020-09-21 DIAGNOSIS — G620 Drug-induced polyneuropathy: Secondary | ICD-10-CM | POA: Diagnosis not present

## 2020-09-21 DIAGNOSIS — C8510 Unspecified B-cell lymphoma, unspecified site: Secondary | ICD-10-CM

## 2020-09-21 DIAGNOSIS — Z5112 Encounter for antineoplastic immunotherapy: Secondary | ICD-10-CM | POA: Diagnosis not present

## 2020-09-21 DIAGNOSIS — K1230 Oral mucositis (ulcerative), unspecified: Secondary | ICD-10-CM | POA: Diagnosis not present

## 2020-09-21 DIAGNOSIS — Z5111 Encounter for antineoplastic chemotherapy: Secondary | ICD-10-CM | POA: Diagnosis not present

## 2020-09-21 DIAGNOSIS — D6481 Anemia due to antineoplastic chemotherapy: Secondary | ICD-10-CM | POA: Diagnosis not present

## 2020-09-21 MED ORDER — PALONOSETRON HCL INJECTION 0.25 MG/5ML
0.2500 mg | Freq: Once | INTRAVENOUS | Status: AC
  Administered 2020-09-21: 0.25 mg via INTRAVENOUS
  Filled 2020-09-21: qty 5

## 2020-09-21 MED ORDER — VINCRISTINE SULFATE CHEMO INJECTION 1 MG/ML
Freq: Once | INTRAVENOUS | Status: DC
  Filled 2020-09-21: qty 11

## 2020-09-22 ENCOUNTER — Inpatient Hospital Stay: Payer: Medicare HMO

## 2020-09-22 VITALS — BP 107/60 | HR 62 | Temp 96.2°F

## 2020-09-22 DIAGNOSIS — C8512 Unspecified B-cell lymphoma, intrathoracic lymph nodes: Secondary | ICD-10-CM | POA: Diagnosis not present

## 2020-09-22 DIAGNOSIS — Z21 Asymptomatic human immunodeficiency virus [HIV] infection status: Secondary | ICD-10-CM | POA: Diagnosis not present

## 2020-09-22 DIAGNOSIS — K1230 Oral mucositis (ulcerative), unspecified: Secondary | ICD-10-CM | POA: Diagnosis not present

## 2020-09-22 DIAGNOSIS — C8514 Unspecified B-cell lymphoma, lymph nodes of axilla and upper limb: Secondary | ICD-10-CM | POA: Diagnosis not present

## 2020-09-22 DIAGNOSIS — C8510 Unspecified B-cell lymphoma, unspecified site: Secondary | ICD-10-CM

## 2020-09-22 DIAGNOSIS — D701 Agranulocytosis secondary to cancer chemotherapy: Secondary | ICD-10-CM | POA: Diagnosis not present

## 2020-09-22 DIAGNOSIS — Z5111 Encounter for antineoplastic chemotherapy: Secondary | ICD-10-CM | POA: Diagnosis not present

## 2020-09-22 DIAGNOSIS — D6481 Anemia due to antineoplastic chemotherapy: Secondary | ICD-10-CM | POA: Diagnosis not present

## 2020-09-22 DIAGNOSIS — G620 Drug-induced polyneuropathy: Secondary | ICD-10-CM | POA: Diagnosis not present

## 2020-09-22 DIAGNOSIS — Z5112 Encounter for antineoplastic immunotherapy: Secondary | ICD-10-CM | POA: Diagnosis not present

## 2020-09-22 DIAGNOSIS — D6959 Other secondary thrombocytopenia: Secondary | ICD-10-CM | POA: Diagnosis not present

## 2020-09-22 MED ORDER — VINCRISTINE SULFATE CHEMO INJECTION 1 MG/ML
Freq: Once | INTRAVENOUS | Status: DC
  Filled 2020-09-22: qty 11

## 2020-09-23 ENCOUNTER — Inpatient Hospital Stay: Payer: Medicare HMO

## 2020-09-23 VITALS — BP 122/60 | HR 70 | Temp 97.4°F

## 2020-09-23 DIAGNOSIS — D6481 Anemia due to antineoplastic chemotherapy: Secondary | ICD-10-CM | POA: Diagnosis not present

## 2020-09-23 DIAGNOSIS — Z21 Asymptomatic human immunodeficiency virus [HIV] infection status: Secondary | ICD-10-CM | POA: Diagnosis not present

## 2020-09-23 DIAGNOSIS — D701 Agranulocytosis secondary to cancer chemotherapy: Secondary | ICD-10-CM | POA: Diagnosis not present

## 2020-09-23 DIAGNOSIS — Z5111 Encounter for antineoplastic chemotherapy: Secondary | ICD-10-CM | POA: Diagnosis not present

## 2020-09-23 DIAGNOSIS — D6959 Other secondary thrombocytopenia: Secondary | ICD-10-CM | POA: Diagnosis not present

## 2020-09-23 DIAGNOSIS — C8510 Unspecified B-cell lymphoma, unspecified site: Secondary | ICD-10-CM

## 2020-09-23 DIAGNOSIS — G620 Drug-induced polyneuropathy: Secondary | ICD-10-CM | POA: Diagnosis not present

## 2020-09-23 DIAGNOSIS — Z5112 Encounter for antineoplastic immunotherapy: Secondary | ICD-10-CM | POA: Diagnosis not present

## 2020-09-23 DIAGNOSIS — C8514 Unspecified B-cell lymphoma, lymph nodes of axilla and upper limb: Secondary | ICD-10-CM | POA: Diagnosis not present

## 2020-09-23 DIAGNOSIS — K1230 Oral mucositis (ulcerative), unspecified: Secondary | ICD-10-CM | POA: Diagnosis not present

## 2020-09-23 MED ORDER — HEPARIN SOD (PORK) LOCK FLUSH 100 UNIT/ML IV SOLN
INTRAVENOUS | Status: AC
  Filled 2020-09-23: qty 5

## 2020-09-23 MED ORDER — SODIUM CHLORIDE 0.9 % IV SOLN
900.0000 mg/m2 | Freq: Once | INTRAVENOUS | Status: AC
  Administered 2020-09-23: 1700 mg via INTRAVENOUS
  Filled 2020-09-23: qty 85

## 2020-09-23 MED ORDER — HEPARIN SOD (PORK) LOCK FLUSH 100 UNIT/ML IV SOLN
500.0000 [IU] | Freq: Once | INTRAVENOUS | Status: AC | PRN
  Administered 2020-09-23: 500 [IU]
  Filled 2020-09-23: qty 5

## 2020-09-23 MED ORDER — PALONOSETRON HCL INJECTION 0.25 MG/5ML
0.2500 mg | Freq: Once | INTRAVENOUS | Status: AC
  Administered 2020-09-23: 0.25 mg via INTRAVENOUS
  Filled 2020-09-23: qty 5

## 2020-09-23 MED ORDER — SODIUM CHLORIDE 0.9 % IV SOLN
Freq: Once | INTRAVENOUS | Status: AC
  Filled 2020-09-23: qty 250

## 2020-09-26 ENCOUNTER — Inpatient Hospital Stay: Payer: Medicare HMO

## 2020-09-26 DIAGNOSIS — G620 Drug-induced polyneuropathy: Secondary | ICD-10-CM | POA: Diagnosis not present

## 2020-09-26 DIAGNOSIS — Z5112 Encounter for antineoplastic immunotherapy: Secondary | ICD-10-CM | POA: Diagnosis not present

## 2020-09-26 DIAGNOSIS — Z21 Asymptomatic human immunodeficiency virus [HIV] infection status: Secondary | ICD-10-CM | POA: Diagnosis not present

## 2020-09-26 DIAGNOSIS — D701 Agranulocytosis secondary to cancer chemotherapy: Secondary | ICD-10-CM | POA: Diagnosis not present

## 2020-09-26 DIAGNOSIS — C8510 Unspecified B-cell lymphoma, unspecified site: Secondary | ICD-10-CM

## 2020-09-26 DIAGNOSIS — D6959 Other secondary thrombocytopenia: Secondary | ICD-10-CM | POA: Diagnosis not present

## 2020-09-26 DIAGNOSIS — C8514 Unspecified B-cell lymphoma, lymph nodes of axilla and upper limb: Secondary | ICD-10-CM | POA: Diagnosis not present

## 2020-09-26 DIAGNOSIS — K1230 Oral mucositis (ulcerative), unspecified: Secondary | ICD-10-CM | POA: Diagnosis not present

## 2020-09-26 DIAGNOSIS — D6481 Anemia due to antineoplastic chemotherapy: Secondary | ICD-10-CM | POA: Diagnosis not present

## 2020-09-26 DIAGNOSIS — Z5111 Encounter for antineoplastic chemotherapy: Secondary | ICD-10-CM | POA: Diagnosis not present

## 2020-09-26 MED ORDER — PEGFILGRASTIM-CBQV 6 MG/0.6ML ~~LOC~~ SOSY
6.0000 mg | PREFILLED_SYRINGE | Freq: Once | SUBCUTANEOUS | Status: AC
  Administered 2020-09-26: 6 mg via SUBCUTANEOUS
  Filled 2020-09-26: qty 0.6

## 2020-09-28 ENCOUNTER — Ambulatory Visit
Admission: RE | Admit: 2020-09-28 | Discharge: 2020-09-28 | Disposition: A | Discharge status: Home / Self Care | Payer: Medicare HMO | Source: Ambulatory Visit | Attending: Oncology | Admitting: Oncology

## 2020-09-28 ENCOUNTER — Other Ambulatory Visit: Payer: Self-pay

## 2020-09-28 ENCOUNTER — Inpatient Hospital Stay: Payer: Medicare HMO | Attending: Oncology

## 2020-09-28 VITALS — BP 100/61 | HR 64 | Temp 98.2°F | Resp 14

## 2020-09-28 DIAGNOSIS — C8510 Unspecified B-cell lymphoma, unspecified site: Secondary | ICD-10-CM | POA: Diagnosis not present

## 2020-09-28 DIAGNOSIS — C8514 Unspecified B-cell lymphoma, lymph nodes of axilla and upper limb: Secondary | ICD-10-CM | POA: Insufficient documentation

## 2020-09-28 DIAGNOSIS — C8330 Diffuse large B-cell lymphoma, unspecified site: Secondary | ICD-10-CM | POA: Diagnosis not present

## 2020-09-28 DIAGNOSIS — Z5111 Encounter for antineoplastic chemotherapy: Secondary | ICD-10-CM | POA: Diagnosis not present

## 2020-09-28 LAB — CBC
HCT: 23 % — ABNORMAL LOW (ref 39.0–52.0)
Hemoglobin: 7.5 g/dL — ABNORMAL LOW (ref 13.0–17.0)
MCH: 33 pg (ref 26.0–34.0)
MCHC: 32.6 g/dL (ref 30.0–36.0)
MCV: 101.3 fL — ABNORMAL HIGH (ref 80.0–100.0)
Platelets: 77 10*3/uL — ABNORMAL LOW (ref 150–400)
RBC: 2.27 MIL/uL — ABNORMAL LOW (ref 4.22–5.81)
RDW: 16.8 % — ABNORMAL HIGH (ref 11.5–15.5)
WBC: 3 10*3/uL — ABNORMAL LOW (ref 4.0–10.5)
nRBC: 0 % (ref 0.0–0.2)

## 2020-09-28 LAB — PROTIME-INR
INR: 1 (ref 0.8–1.2)
Prothrombin Time: 12.8 seconds (ref 11.4–15.2)

## 2020-09-28 LAB — APTT: aPTT: 26 seconds (ref 24–36)

## 2020-09-28 MED ORDER — SODIUM CHLORIDE (PF) 0.9 % IJ SOLN
Freq: Once | INTRAMUSCULAR | Status: AC
  Filled 2020-09-28: qty 0.48

## 2020-09-28 MED ORDER — ACETAMINOPHEN 500 MG PO TABS
1000.0000 mg | ORAL_TABLET | Freq: Four times a day (QID) | ORAL | Status: DC | PRN
  Filled 2020-09-28: qty 2

## 2020-09-28 MED FILL — Methotrexate Sodium Inj PF 50 MG/2ML (25 MG/ML): INTRAMUSCULAR | Qty: 12 | Status: AC

## 2020-09-29 ENCOUNTER — Encounter: Payer: Self-pay | Admitting: Oncology

## 2020-09-29 ENCOUNTER — Other Ambulatory Visit: Payer: Self-pay

## 2020-09-29 ENCOUNTER — Inpatient Hospital Stay: Payer: Medicare HMO

## 2020-09-29 DIAGNOSIS — C8514 Unspecified B-cell lymphoma, lymph nodes of axilla and upper limb: Secondary | ICD-10-CM | POA: Diagnosis not present

## 2020-09-29 DIAGNOSIS — C8510 Unspecified B-cell lymphoma, unspecified site: Secondary | ICD-10-CM

## 2020-09-29 LAB — CBC WITH DIFFERENTIAL/PLATELET
Abs Immature Granulocytes: 0.08 10*3/uL — ABNORMAL HIGH (ref 0.00–0.07)
Basophils Absolute: 0 10*3/uL (ref 0.0–0.1)
Basophils Relative: 3 %
Eosinophils Absolute: 0 10*3/uL (ref 0.0–0.5)
Eosinophils Relative: 0 %
HCT: 23.3 % — ABNORMAL LOW (ref 39.0–52.0)
Hemoglobin: 7.3 g/dL — ABNORMAL LOW (ref 13.0–17.0)
Immature Granulocytes: 12 %
Lymphocytes Relative: 10 %
Lymphs Abs: 0.1 10*3/uL — ABNORMAL LOW (ref 0.7–4.0)
MCH: 31.9 pg (ref 26.0–34.0)
MCHC: 31.3 g/dL (ref 30.0–36.0)
MCV: 101.7 fL — ABNORMAL HIGH (ref 80.0–100.0)
Monocytes Absolute: 0 10*3/uL — ABNORMAL LOW (ref 0.1–1.0)
Monocytes Relative: 2 %
Neutro Abs: 0.5 10*3/uL — ABNORMAL LOW (ref 1.7–7.7)
Neutrophils Relative %: 73 %
Platelets: 45 10*3/uL — ABNORMAL LOW (ref 150–400)
RBC: 2.29 MIL/uL — ABNORMAL LOW (ref 4.22–5.81)
RDW: 16.4 % — ABNORMAL HIGH (ref 11.5–15.5)
WBC: 0.7 10*3/uL — CL (ref 4.0–10.5)
nRBC: 0 % (ref 0.0–0.2)

## 2020-09-29 LAB — COMPREHENSIVE METABOLIC PANEL
ALT: 17 U/L (ref 0–44)
AST: 16 U/L (ref 15–41)
Albumin: 3.4 g/dL — ABNORMAL LOW (ref 3.5–5.0)
Alkaline Phosphatase: 81 U/L (ref 38–126)
Anion gap: 10 (ref 5–15)
BUN: 21 mg/dL (ref 8–23)
CO2: 23 mmol/L (ref 22–32)
Calcium: 8.4 mg/dL — ABNORMAL LOW (ref 8.9–10.3)
Chloride: 106 mmol/L (ref 98–111)
Creatinine, Ser: 0.9 mg/dL (ref 0.61–1.24)
GFR, Estimated: >60 mL/min (ref >60)
Glucose, Bld: 102 mg/dL — ABNORMAL HIGH (ref 70–99)
Potassium: 4.3 mmol/L (ref 3.5–5.1)
Sodium: 139 mmol/L (ref 135–145)
Total Bilirubin: 0.9 mg/dL (ref 0.3–1.2)
Total Protein: 6 g/dL — ABNORMAL LOW (ref 6.5–8.1)

## 2020-09-29 MED ORDER — CHLORHEXIDINE GLUCONATE 0.12 % MT SOLN: 15.0000 mL | Freq: Two times a day (BID) | OROMUCOSAL | 0 refills | Status: DC

## 2020-09-30 ENCOUNTER — Other Ambulatory Visit: Payer: Self-pay

## 2020-09-30 MED ORDER — CIPROFLOXACIN HCL 500 MG PO TABS: 500.0000 mg | ORAL_TABLET | Freq: Two times a day (BID) | ORAL | 0 refills | Status: DC

## 2020-10-03 ENCOUNTER — Telehealth: Payer: Self-pay | Admitting: *Deleted

## 2020-10-03 NOTE — Telephone Encounter (Signed)
Please ask symptom management clinic to see him.

## 2020-10-03 NOTE — Telephone Encounter (Signed)
Wife called reporting that patient has not been eating or drinking much for past 3 days due to mouth sores and is very weak. She states he has completed his treatments.She is concerned that he is dehydrated and is asking what to do and states that he has a lab appointment tomorrow. Please advise.

## 2020-10-04 ENCOUNTER — Emergency Department: Payer: Medicare HMO

## 2020-10-04 ENCOUNTER — Inpatient Hospital Stay: Payer: Medicare HMO

## 2020-10-04 ENCOUNTER — Other Ambulatory Visit: Payer: Self-pay

## 2020-10-04 ENCOUNTER — Inpatient Hospital Stay (HOSPITAL_BASED_OUTPATIENT_CLINIC_OR_DEPARTMENT_OTHER): Payer: Medicare HMO | Admitting: Nurse Practitioner

## 2020-10-04 ENCOUNTER — Encounter: Payer: Self-pay | Admitting: Emergency Medicine

## 2020-10-04 ENCOUNTER — Other Ambulatory Visit: Payer: Self-pay | Admitting: Oncology

## 2020-10-04 ENCOUNTER — Inpatient Hospital Stay
Admission: EM | Admit: 2020-10-04 | Discharge: 2020-10-08 | DRG: 977 | Disposition: A | Discharge status: Home / Self Care | Payer: Medicare HMO | Attending: Internal Medicine | Admitting: Internal Medicine

## 2020-10-04 VITALS — BP 99/59 | HR 106 | Temp 101.2°F | Resp 17

## 2020-10-04 DIAGNOSIS — D649 Anemia, unspecified: Secondary | ICD-10-CM

## 2020-10-04 DIAGNOSIS — C8510 Unspecified B-cell lymphoma, unspecified site: Secondary | ICD-10-CM | POA: Diagnosis present

## 2020-10-04 DIAGNOSIS — Z20822 Contact with and (suspected) exposure to covid-19: Secondary | ICD-10-CM | POA: Diagnosis present

## 2020-10-04 DIAGNOSIS — D6481 Anemia due to antineoplastic chemotherapy: Secondary | ICD-10-CM | POA: Diagnosis present

## 2020-10-04 DIAGNOSIS — E46 Unspecified protein-calorie malnutrition: Secondary | ICD-10-CM | POA: Diagnosis not present

## 2020-10-04 DIAGNOSIS — R5381 Other malaise: Secondary | ICD-10-CM | POA: Diagnosis present

## 2020-10-04 DIAGNOSIS — R5081 Fever presenting with conditions classified elsewhere: Secondary | ICD-10-CM

## 2020-10-04 DIAGNOSIS — T451X5A Adverse effect of antineoplastic and immunosuppressive drugs, initial encounter: Secondary | ICD-10-CM | POA: Diagnosis present

## 2020-10-04 DIAGNOSIS — D708 Other neutropenia: Secondary | ICD-10-CM | POA: Diagnosis not present

## 2020-10-04 DIAGNOSIS — R634 Abnormal weight loss: Secondary | ICD-10-CM | POA: Diagnosis present

## 2020-10-04 DIAGNOSIS — Z6822 Body mass index (BMI) 22.0-22.9, adult: Secondary | ICD-10-CM | POA: Diagnosis not present

## 2020-10-04 DIAGNOSIS — G620 Drug-induced polyneuropathy: Secondary | ICD-10-CM | POA: Diagnosis present

## 2020-10-04 DIAGNOSIS — Z79899 Other long term (current) drug therapy: Secondary | ICD-10-CM

## 2020-10-04 DIAGNOSIS — D709 Neutropenia, unspecified: Secondary | ICD-10-CM

## 2020-10-04 DIAGNOSIS — D61818 Other pancytopenia: Secondary | ICD-10-CM | POA: Diagnosis present

## 2020-10-04 DIAGNOSIS — D696 Thrombocytopenia, unspecified: Secondary | ICD-10-CM

## 2020-10-04 DIAGNOSIS — Z298 Encounter for other specified prophylactic measures: Secondary | ICD-10-CM | POA: Diagnosis not present

## 2020-10-04 DIAGNOSIS — E876 Hypokalemia: Secondary | ICD-10-CM | POA: Diagnosis present

## 2020-10-04 DIAGNOSIS — D701 Agranulocytosis secondary to cancer chemotherapy: Principal | ICD-10-CM | POA: Diagnosis present

## 2020-10-04 DIAGNOSIS — A419 Sepsis, unspecified organism: Secondary | ICD-10-CM | POA: Diagnosis not present

## 2020-10-04 DIAGNOSIS — K1230 Oral mucositis (ulcerative), unspecified: Secondary | ICD-10-CM | POA: Diagnosis present

## 2020-10-04 DIAGNOSIS — I959 Hypotension, unspecified: Secondary | ICD-10-CM | POA: Diagnosis present

## 2020-10-04 DIAGNOSIS — N400 Enlarged prostate without lower urinary tract symptoms: Secondary | ICD-10-CM | POA: Diagnosis present

## 2020-10-04 DIAGNOSIS — B20 Human immunodeficiency virus [HIV] disease: Secondary | ICD-10-CM | POA: Diagnosis present

## 2020-10-04 DIAGNOSIS — R509 Fever, unspecified: Secondary | ICD-10-CM

## 2020-10-04 DIAGNOSIS — E440 Moderate protein-calorie malnutrition: Secondary | ICD-10-CM | POA: Diagnosis present

## 2020-10-04 DIAGNOSIS — Z2989 Encounter for other specified prophylactic measures: Secondary | ICD-10-CM

## 2020-10-04 LAB — CBC WITH DIFFERENTIAL/PLATELET
Abs Immature Granulocytes: 0 10*3/uL (ref 0.00–0.07)
Abs Immature Granulocytes: 0.05 10*3/uL (ref 0.00–0.07)
Abs Immature Granulocytes: 0 10*3/uL (ref 0.00–0.07)
Basophils Absolute: 0 10*3/uL (ref 0.0–0.1)
Basophils Absolute: 0 10*3/uL (ref 0.0–0.1)
Basophils Absolute: 0 10*3/uL (ref 0.0–0.1)
Basophils Relative: 0 %
Basophils Relative: 0 %
Basophils Relative: 7 %
Eosinophils Absolute: 0 10*3/uL (ref 0.0–0.5)
Eosinophils Absolute: 0 10*3/uL (ref 0.0–0.5)
Eosinophils Absolute: 0 10*3/uL (ref 0.0–0.5)
Eosinophils Relative: 6 %
Eosinophils Relative: 6 %
Eosinophils Relative: 7 %
HCT: 18.3 % — ABNORMAL LOW (ref 39.0–52.0)
HCT: 16.8 % — ABNORMAL LOW (ref 39.0–52.0)
HCT: 16.6 % — ABNORMAL LOW (ref 39.0–52.0)
Hemoglobin: 6.1 g/dL — ABNORMAL LOW (ref 13.0–17.0)
Hemoglobin: 5.5 g/dL — ABNORMAL LOW (ref 13.0–17.0)
Hemoglobin: 5.5 g/dL — ABNORMAL LOW (ref 13.0–17.0)
Immature Granulocytes: 0 %
Immature Granulocytes: 31 %
Immature Granulocytes: 0 %
Lymphocytes Relative: 13 %
Lymphocytes Relative: 19 %
Lymphocytes Relative: 14 %
Lymphs Abs: 0 10*3/uL — ABNORMAL LOW (ref 0.7–4.0)
Lymphs Abs: 0 10*3/uL — ABNORMAL LOW (ref 0.7–4.0)
Lymphs Abs: 0 10*3/uL — ABNORMAL LOW (ref 0.7–4.0)
MCH: 32.4 pg (ref 26.0–34.0)
MCH: 32.4 pg (ref 26.0–34.0)
MCH: 31.6 pg (ref 26.0–34.0)
MCHC: 33.3 g/dL (ref 30.0–36.0)
MCHC: 32.7 g/dL (ref 30.0–36.0)
MCHC: 33.1 g/dL (ref 30.0–36.0)
MCV: 97.3 fL (ref 80.0–100.0)
MCV: 98.8 fL (ref 80.0–100.0)
MCV: 95.4 fL (ref 80.0–100.0)
Monocytes Absolute: 0 10*3/uL — ABNORMAL LOW (ref 0.1–1.0)
Monocytes Absolute: 0 10*3/uL — ABNORMAL LOW (ref 0.1–1.0)
Monocytes Absolute: 0 10*3/uL — ABNORMAL LOW (ref 0.1–1.0)
Monocytes Relative: 13 %
Monocytes Relative: 13 %
Monocytes Relative: 14 %
Neutro Abs: 0.1 10*3/uL — CL (ref 1.7–7.7)
Neutro Abs: 0.1 10*3/uL — CL (ref 1.7–7.7)
Neutro Abs: 0.1 10*3/uL — CL (ref 1.7–7.7)
Neutrophils Relative %: 68 %
Neutrophils Relative %: 31 %
Neutrophils Relative %: 58 %
Platelets: 8 10*3/uL — CL (ref 150–400)
Platelets: 7 10*3/uL — CL (ref 150–400)
Platelets: 13 10*3/uL — CL (ref 150–400)
RBC: 1.88 MIL/uL — ABNORMAL LOW (ref 4.22–5.81)
RBC: 1.7 MIL/uL — ABNORMAL LOW (ref 4.22–5.81)
RBC: 1.74 MIL/uL — ABNORMAL LOW (ref 4.22–5.81)
RDW: 15.8 % — ABNORMAL HIGH (ref 11.5–15.5)
RDW: 15.6 % — ABNORMAL HIGH (ref 11.5–15.5)
RDW: 17.1 % — ABNORMAL HIGH (ref 11.5–15.5)
Smear Review: DECREASED
WBC: 0.2 10*3/uL — CL (ref 4.0–10.5)
WBC: 0.2 10*3/uL — CL (ref 4.0–10.5)
WBC: 0.1 10*3/uL — CL (ref 4.0–10.5)
nRBC: 0 % (ref 0.0–0.2)
nRBC: 0 % (ref 0.0–0.2)
nRBC: 0 % (ref 0.0–0.2)

## 2020-10-04 LAB — URINALYSIS, COMPLETE (UACMP) WITH MICROSCOPIC
Bacteria, UA: NONE SEEN
Bilirubin Urine: NEGATIVE
Glucose, UA: NEGATIVE mg/dL
Hgb urine dipstick: NEGATIVE
Ketones, ur: NEGATIVE mg/dL
Leukocytes,Ua: NEGATIVE
Nitrite: NEGATIVE
Protein, ur: NEGATIVE mg/dL
Specific Gravity, Urine: 1.028 (ref 1.005–1.030)
pH: 5 (ref 5.0–8.0)

## 2020-10-04 LAB — COMPREHENSIVE METABOLIC PANEL
ALT: 22 U/L (ref 0–44)
ALT: 24 U/L (ref 0–44)
AST: 20 U/L (ref 15–41)
AST: 26 U/L (ref 15–41)
Albumin: 3.3 g/dL — ABNORMAL LOW (ref 3.5–5.0)
Albumin: 3.4 g/dL — ABNORMAL LOW (ref 3.5–5.0)
Alkaline Phosphatase: 62 U/L (ref 38–126)
Alkaline Phosphatase: 67 U/L (ref 38–126)
Anion gap: 10 (ref 5–15)
Anion gap: 8 (ref 5–15)
BUN: 30 mg/dL — ABNORMAL HIGH (ref 8–23)
BUN: 34 mg/dL — ABNORMAL HIGH (ref 8–23)
CO2: 22 mmol/L (ref 22–32)
CO2: 21 mmol/L — ABNORMAL LOW (ref 22–32)
Calcium: 8.8 mg/dL — ABNORMAL LOW (ref 8.9–10.3)
Calcium: 8.7 mg/dL — ABNORMAL LOW (ref 8.9–10.3)
Chloride: 104 mmol/L (ref 98–111)
Chloride: 104 mmol/L (ref 98–111)
Creatinine, Ser: 1.01 mg/dL (ref 0.61–1.24)
Creatinine, Ser: 0.94 mg/dL (ref 0.61–1.24)
GFR, Estimated: >60 mL/min (ref >60)
GFR, Estimated: >60 mL/min (ref >60)
Glucose, Bld: 141 mg/dL — ABNORMAL HIGH (ref 70–99)
Glucose, Bld: 131 mg/dL — ABNORMAL HIGH (ref 70–99)
Potassium: 3.9 mmol/L (ref 3.5–5.1)
Potassium: 3.9 mmol/L (ref 3.5–5.1)
Sodium: 136 mmol/L (ref 135–145)
Sodium: 133 mmol/L — ABNORMAL LOW (ref 135–145)
Total Bilirubin: 0.7 mg/dL (ref 0.3–1.2)
Total Bilirubin: 0.7 mg/dL (ref 0.3–1.2)
Total Protein: 6.6 g/dL (ref 6.5–8.1)
Total Protein: 6.8 g/dL (ref 6.5–8.1)

## 2020-10-04 LAB — BPAM PLATELET PHERESIS
Blood Product Expiration Date: 202203092359
Unit Type and Rh: 6200

## 2020-10-04 LAB — PREPARE RBC (CROSSMATCH)

## 2020-10-04 LAB — PROTIME-INR
INR: 1.1 (ref 0.8–1.2)
Prothrombin Time: 14 seconds (ref 11.4–15.2)

## 2020-10-04 LAB — LACTIC ACID, PLASMA
Lactic Acid, Venous: 1.8 mmol/L (ref 0.5–1.9)
Lactic Acid, Venous: 0.9 mmol/L (ref 0.5–1.9)

## 2020-10-04 LAB — PREPARE PLATELET PHERESIS: Unit division: 0

## 2020-10-04 LAB — TYPE AND SCREEN
ABO/RH(D): O POS
Antibody Screen: NEGATIVE

## 2020-10-04 MED ORDER — METRONIDAZOLE IN NACL 5-0.79 MG/ML-% IV SOLN
500.0000 mg | Freq: Once | INTRAVENOUS | Status: AC
  Administered 2020-10-04: 500 mg via INTRAVENOUS
  Filled 2020-10-04: qty 100

## 2020-10-04 MED ORDER — OXYCODONE HCL 5 MG PO TABS: 5.0000 mg | ORAL_TABLET | ORAL | Status: DC | PRN

## 2020-10-04 MED ORDER — ONDANSETRON HCL 4 MG/2ML IJ SOLN: 4.0000 mg | Freq: Four times a day (QID) | INTRAMUSCULAR | Status: DC | PRN

## 2020-10-04 MED ORDER — ACETAMINOPHEN 325 MG PO TABS
650.0000 mg | ORAL_TABLET | Freq: Four times a day (QID) | ORAL | Status: DC | PRN
  Administered 2020-10-04: 650 mg via ORAL
  Filled 2020-10-04: qty 2

## 2020-10-04 MED ORDER — SODIUM CHLORIDE 0.9 % IV SOLN
10.0000 mL/h | Freq: Once | INTRAVENOUS | Status: AC
  Administered 2020-10-04: 10 mL/h via INTRAVENOUS

## 2020-10-04 MED ORDER — SODIUM CHLORIDE 0.9 % IV SOLN
2.0000 g | Freq: Once | INTRAVENOUS | Status: AC
  Administered 2020-10-04: 2 g via INTRAVENOUS
  Filled 2020-10-04: qty 2

## 2020-10-04 MED ORDER — SODIUM CHLORIDE 0.9 % IV SOLN
2.0000 g | Freq: Three times a day (TID) | INTRAVENOUS | Status: AC
  Administered 2020-10-04 – 2020-10-06 (×7): 2 g via INTRAVENOUS
  Filled 2020-10-04 (×9): qty 2

## 2020-10-04 MED ORDER — MAGIC MOUTHWASH W/LIDOCAINE
5.0000 mL | Freq: Four times a day (QID) | ORAL | Status: DC | PRN
  Filled 2020-10-04 (×4): qty 5

## 2020-10-04 MED ORDER — ACYCLOVIR 200 MG PO CAPS
400.0000 mg | ORAL_CAPSULE | Freq: Two times a day (BID) | ORAL | Status: DC
  Administered 2020-10-04 – 2020-10-08 (×8): 400 mg via ORAL
  Filled 2020-10-04 (×9): qty 2

## 2020-10-04 MED ORDER — VANCOMYCIN HCL 750 MG/150ML IV SOLN
750.0000 mg | Freq: Two times a day (BID) | INTRAVENOUS | Status: DC
  Administered 2020-10-05 – 2020-10-06 (×4): 750 mg via INTRAVENOUS
  Filled 2020-10-04 (×5): qty 150

## 2020-10-04 MED ORDER — BICTEGRAVIR-EMTRICITAB-TENOFOV 50-200-25 MG PO TABS
1.0000 | ORAL_TABLET | Freq: Every day | ORAL | Status: DC
  Administered 2020-10-05 – 2020-10-08 (×4): 1 via ORAL
  Filled 2020-10-04 (×4): qty 1

## 2020-10-04 MED ORDER — VANCOMYCIN HCL IN DEXTROSE 1-5 GM/200ML-% IV SOLN
1000.0000 mg | Freq: Once | INTRAVENOUS | Status: AC
  Administered 2020-10-04: 1000 mg via INTRAVENOUS
  Filled 2020-10-04: qty 200

## 2020-10-04 MED ORDER — GABAPENTIN 100 MG PO CAPS
100.0000 mg | ORAL_CAPSULE | Freq: Three times a day (TID) | ORAL | Status: DC
  Administered 2020-10-04 – 2020-10-08 (×11): 100 mg via ORAL
  Filled 2020-10-04 (×11): qty 1

## 2020-10-04 MED ORDER — ONDANSETRON HCL 4 MG PO TABS: 4.0000 mg | ORAL_TABLET | Freq: Four times a day (QID) | ORAL | Status: DC | PRN

## 2020-10-04 MED ORDER — METRONIDAZOLE IN NACL 5-0.79 MG/ML-% IV SOLN
500.0000 mg | Freq: Three times a day (TID) | INTRAVENOUS | Status: DC
  Administered 2020-10-05 (×2): 500 mg via INTRAVENOUS
  Filled 2020-10-04 (×5): qty 100

## 2020-10-04 MED ORDER — ACETAMINOPHEN 650 MG RE SUPP: 650.0000 mg | Freq: Four times a day (QID) | RECTAL | Status: DC | PRN

## 2020-10-04 MED ORDER — SODIUM CHLORIDE 0.9 % IV BOLUS
500.0000 mL | Freq: Once | INTRAVENOUS | Status: AC
  Administered 2020-10-04: 500 mL via INTRAVENOUS

## 2020-10-04 MED ORDER — ACYCLOVIR 400 MG PO TABS
400.0000 mg | ORAL_TABLET | Freq: Two times a day (BID) | ORAL | Status: DC
  Filled 2020-10-04: qty 1

## 2020-10-04 MED ORDER — SULFAMETHOXAZOLE-TRIMETHOPRIM 800-160 MG PO TABS
1.0000 | ORAL_TABLET | Freq: Every day | ORAL | Status: DC
  Administered 2020-10-05 – 2020-10-07 (×3): 1 via ORAL
  Filled 2020-10-04 (×3): qty 1

## 2020-10-04 NOTE — ED Provider Notes (Signed)
[facility] Emergency Department Provider Note ____________________________________________   Event Date/Time   First MD Initiated Contact with Patient 10/04/20 1420     (approximate)  I have reviewed the triage vital signs and the nursing notes.   HISTORY  Chief Complaint Fever    HPI Justin Wallace is a 75 y.o. male but history of lymphoma  Patient has not been feeling well for several weeks but reports that he just felt like last Wednesday he started to feel more fatigued.  He went to see oncology today to have labs drawn as he is undergoing chemotherapy and does not felt well they noticed he had a fever of 101 and on his labs today revealed severe pancytopenia  Patient reports his blood counts are down he was told he needed platelets and a blood transfusion.  Sent to the ER for the fever and to be admitted to the hospital.  No chest pain no shortness of breath no cough.  He did not notice a fever himself  No rashes no abdominal pain no headache.   Past Medical History:  Diagnosis Date  . Celiac disease   . 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 HIV (human immunodeficiency virus infection) ([place])   . Lyme disease   . Lymphoma [facility])     Patient Active Problem List   Diagnosis Date Noted  . Stomatitis and mucositis 08/22/2020  . 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 Chemotherapy-induced neutropenia ([place]) 08/08/2020  . Encounter for antineoplastic chemotherapy 07/15/2020  . Anemia due to antineoplastic chemotherapy 06/27/2020  . Goals of care, counseling/discussion 06/13/2020  . 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 Need for pneumocystis prophylaxis 05/18/2020  . Vaccine counseling 05/18/2020  . Lymphoma ([place]) 05/18/2020  . Health care maintenance 05/18/2020  . Lymphadenopathy 03/31/2020  . Weight loss 03/31/2020  . Axillary mass, left  03/31/2020    Past Surgical History:  Procedure Laterality Date  . PORTA CATH INSERTION N/A 04/25/2020   Procedure: PORTA CATH INSERTION;  Surgeon: Algernon Huxley, MD;  Location: [facility];  Service: Cardiovascular;  Laterality: N/A;    Prior to Admission medications   Medication Sig Start Date End Date Taking? Authorizing Provider  acyclovir (ZOVIRAX) 400 MG tablet Take 1 tablet (400 mg total) by mouth 2 (two) times daily. 06/06/20   Earlie Server, MD  bictegravir-emtricitabine-tenofovir AF (BIKTARVY) 50-200-25 MG TABS tablet Take 1 tablet by mouth daily. 05/18/20   Mignon Pine, DO  chlorhexidine (PERIDEX) 0.12 % solution Use as directed 15 mLs in the mouth or throat 2 (two) times daily. 09/29/20   Earlie Server, MD  ciprofloxacin (CIPRO) 500 MG tablet Take 1 tablet (500 mg total) by mouth 2 (two) times daily. 09/30/20   Earlie Server, MD  gabapentin (NEURONTIN) 100 MG capsule Take 1 capsule (100 mg total) by mouth 3 (three) times daily. 09/19/20   Earlie Server, MD  hydrocortisone (ANUSOL-HC) 2.5 % rectal cream Place 1 application rectally 2 (two) times daily. For up to 1-2 weeks. As needed hemorrhoids 08/23/20   Olin Hauser, DO  lidocaine-prilocaine (EMLA) cream Apply to affected area once 06/06/20   Earlie Server, MD  magic mouthwash w/lidocaine SOLN Take 5 mLs by mouth 4 (four) times daily as needed for mouth pain. Sig: Swish/spit 5-10 ml four times a day as needed 05/31/20   Earlie Server, MD  ondansetron (ZOFRAN) 8 MG tablet Take  1 tablet (8 mg total) by mouth 2 (two) times daily as needed for refractory nausea / vomiting. Start on the third day after chemotherapy. 06/06/20   Earlie Server, MD  polyethylene glycol powder [facility]) 17 GM/SCOOP powder Take by mouth as needed.    [provider]  predniSONE (DELTASONE) 50 MG tablet Take 2 tablets on Day 2, 3, 4, 5 of each chemotherapy treatments. 08/09/20   Earlie Server, MD  prochlorperazine (COMPAZINE) 10 MG tablet Take 1 tablet (10 mg total)  by mouth every 6 (six) hours as needed (Nausea or vomiting). Patient not taking: No sig reported 06/06/20   Earlie Server, MD  sulfamethoxazole-trimethoprim (BACTRIM DS) 800-160 MG tablet Take 1 tablet by mouth daily. 09/13/20   Mignon Pine, DO    Allergies Patient has no known allergies.  Family History  Problem Relation Age of Onset  . Hyperlipidemia Mother   . Brain cancer Father     Social History Social History   Tobacco Use  . Smoking status: Never Smoker  . Smokeless tobacco: Never Used  Vaping Use  . Vaping Use: Never used  Substance Use Topics  . Alcohol use: Yes    Comment: occasional  . Drug use: Never    Review of Systems Constitutional: Fever at clinic today Eyes: No visual changes. ENT: No sore throat.  His mouth feels sore he has a small lesion from chemotherapy on his left tongue denies any dental pain or swelling in the mouth Cardiovascular: Denies chest pain. Respiratory: Denies shortness of breath. Gastrointestinal: No abdominal pain.  Would like something to eat and drink Genitourinary: Negative for dysuria. Musculoskeletal: Negative for back pain. Skin: Negative for rash. Neurological: Negative for headaches, areas of focal weakness or numbness.    ____________________________________________   PHYSICAL EXAM:  VITAL SIGNS: ED Triage Vitals [10/04/20 1252]  Enc Vitals Group     BP (!) 108/59     Pulse Rate 99     Resp 18     Temp 97.8 F (36.6 C)     Temp Source Oral     SpO2 100 %     Weight 155 lb (70.3 kg)     Height      Head Circumference      Peak Flow      Pain Score      Pain Loc      Pain Edu?      Excl. in [place]?     Constitutional: Alert and oriented.  Mildly ill-appearing but in no acute distress.  Appears fatigued Eyes: Conjunctivae are normal. Head: Atraumatic. Nose: No congestion/rhinnorhea. Mouth/Throat: Mucous membranes are slightly dry, small ulceration noticed over the left lateral tip of the tongue without  surrounding erythema or drainage. Neck: No stridor.  Cardiovascular: Normal rate, regular rhythm. Grossly normal heart sounds.  Good peripheral circulation.  Port in the right upper chest wall clean dry intact without surrounding erythema Respiratory: Normal respiratory effort.  No retractions. Lungs CTAB. Gastrointestinal: Soft and nontender. No distention. Musculoskeletal: No lower extremity tenderness nor edema. Neurologic:  Normal speech and language. No gross focal neurologic deficits are appreciated.  Skin:  Skin is warm, dry and intact. No rash noted. Psychiatric: Mood and affect are normal. Speech and behavior are normal.  ____________________________________________   LABS (all labs ordered are listed, but only abnormal results are displayed)  Labs Reviewed  COMPREHENSIVE METABOLIC PANEL - Abnormal; Notable for the following components:      Result Value   Sodium 133 (*)  CO2 21 (*)    Glucose, Bld 131 (*)    BUN 34 (*)    Calcium 8.7 (*)    Albumin 3.4 (*)    All other components within normal limits  CBC WITH DIFFERENTIAL/PLATELET - Abnormal; Notable for the following components:   WBC 0.2 (*)    RBC 1.70 (*)    Hemoglobin 5.5 (*)    HCT 16.8 (*)    RDW 15.6 (*)    Platelets 7 (*)    Neutro Abs 0.1 (*)    Lymphs Abs 0.0 (*)    Monocytes Absolute 0.0 (*)    All other components within normal limits  CULTURE, BLOOD (ROUTINE X 2)  CULTURE, BLOOD (ROUTINE X 2)  SARS CORONAVIRUS 2 (TAT 6-24 HRS)  LACTIC ACID, PLASMA  PROTIME-INR  LACTIC ACID, PLASMA  URINALYSIS, COMPLETE (UACMP) WITH MICROSCOPIC  PREPARE RBC (CROSSMATCH)  TYPE AND SCREEN  PREPARE PLATELET PHERESIS   ____________________________________________  EKG   ____________________________________________  RADIOLOGY  DG Chest 2 View  Result Date: 10/04/2020 CLINICAL DATA:  Sepsis EXAM: CHEST - 2 VIEW COMPARISON:  03/30/2020 FINDINGS: Heart size is normal. Mediastinal shadows are normal. The lungs  are clear. Power port on the right with the tip in the SVC. No significant bone finding. IMPRESSION: No active disease. Power port on the right. Electronically Signed   By: Nelson Chimes M.D.   On: 10/04/2020 13:54    Chest x-ray reviewed negative for acute ____________________________________________   PROCEDURES  Procedure(s) performed: None  Procedures  Critical Care performed: Yes, see critical care note(s)  CRITICAL CARE Performed by: Delman Kitten   Total critical care time: 30 minutes  Critical care time was exclusive of separately billable procedures and treating other patients.  Critical care was necessary to treat or prevent imminent or life-threatening deterioration.  Critical care was time spent personally by me on the following activities: development of treatment plan with patient and/or surrogate as well as nursing, discussions with consultants, evaluation of patient's response to treatment, examination of patient, obtaining history from patient or surrogate, ordering and performing treatments and interventions, ordering and review of laboratory studies, ordering and review of radiographic studies, pulse oximetry and re-evaluation of patient's condition.  ____________________________________________   INITIAL IMPRESSION / ASSESSMENT AND PLAN / ED COURSE  Pertinent labs & imaging results that were available during my care of the patient were reviewed by me and considered in my medical decision making (see chart for details).   Patient presents for apparent neutropenic fever.  No obvious focal symptoms or obvious focal infection by exam clinical history.  He does however have a fever noted at the oncology clinic.  I have ordered blood product at the recommendations of the oncology service, they also recommend treatment with broad-spectrum antibiotics and admission to the hospital for oncology consultation and hospitalist team management.  Patient understand agreeable with  this plan.  He does overall appear nontoxic but certainly generally ill with neutropenic fever that requires admission.  Cultures ordered, sepsis Covid initiated.  No chest pain.  No cardiac symptoms.  Clinical Course as of 10/04/20 1531  Tue Oct 04, 2020  1505 Case discussed with oncology.  Have ordered platelet and blood products as recommended by oncology.  Additionally, covering broadly with antibiotics for concerns of fever neutropenic.  Hydrating.  Patient being admitted with plan for oncology consultation to hospitalist service. [MQ]    Clinical Course User Index [MQ] Delman Kitten, MD    ----------------------------------------- 3:31 PM on  10/04/2020 -----------------------------------------  Ongoing care signed Dr. Cheri Fowler, patient pending admission.  Need to discuss with hospitalist. ____________________________________________   FINAL CLINICAL IMPRESSION(S) / ED DIAGNOSES  Final diagnoses:  Neutropenia with fever ([place])  Pancytopenia ([place])        Note:  This document was prepared using Dragon voice recognition software and may include unintentional dictation errors       Delman Kitten, MD 10/04/20 1532

## 2020-10-04 NOTE — ED Notes (Signed)
Pt awake and alert; RR even and unlabored on RA- no acute distress noted at this time.  Pt denies pain or other complaints.  Continuous cardiac and pulse ox maintained -- Pt remains hypotensive 88/53 current bp.  Abdomen soft nontender.  Skin warm dry and intact.  Unit prbcs transfusing @150ml /hr to R chest port; no signs of complications at site.  Pt denies any immediate needs, questions, concerns.

## 2020-10-04 NOTE — ED Notes (Signed)
Pt has accessed port placed

## 2020-10-04 NOTE — ED Triage Notes (Signed)
Pt sent over from cancer center for neutropenic fever (101.4 at cancer center) and possible sepsis. Was getting a blood transfusion and labs reflected neutropenia and VS were unstable at appointment

## 2020-10-04 NOTE — H&P (Addendum)
History and Physical   Treydon Henricks FXT:[id number] DOB: 1946-01-23 DOA: 10/04/2020  PCP: Olin Hauser, DO  Outpatient Specialists: Dr. Earlie Server, medical oncology Patient coming from: Medical oncology clinic  I have personally briefly reviewed patient's old medical records in [place].  Chief Concern: Fever  HPI: Justin Wallace is a 75 y.o. male with medical history significant for AIDS, B-cell lymphoma, BPH, presented to the emergency department at the urging of his oncology clinic for chief concerns of abnormal labs and fever.  Mr. Vipond was at oncology clinic and was found to have a temperature of 101.2.  And platelet was low at 8, WBC of 0.2, ANC of 100.  Patient was sent to the emergency department for further evaluation.  At bedside patient was awake alert and oriented to self, age, current location of hospital, current calendar year.  She was able to follow commands equally on all extremities.  Bilateral pupils equal and reactive to light.  No facial deviation.  He denies any pain or shortness of breath.  He endorses generalized weakness that has started after initiation of chemotherapy.  He denies any rashes, dysuria, hematuria, cough, chills.  He denies dizziness or headaches.  Social history: lives with his spouse. He denies tobacco use and recreational drug use.  He endorses infrequent EtOH.  He reports that the last time he was sexually active with his spouse was about 7 to 8 years ago.  Vaccination: Patient is fully vaccinated for COVID-19  ROS: Constitutional: no weight change, + fever ENT/Mouth: no sore throat, no rhinorrhea Eyes: no eye pain, no vision changes Cardiovascular: no chest pain, no dyspnea,  no edema, no palpitations Respiratory: no cough, no sputum, no wheezing Gastrointestinal: no nausea, no vomiting, no diarrhea, no constipation Genitourinary: no urinary incontinence, no dysuria, no hematuria Musculoskeletal: no arthralgias, no myalgias Skin: no  skin lesions, no pruritus, Neuro: + weakness, no loss of consciousness, no syncope Psych: no anxiety, no depression, + decrease appetite Heme/Lymph: no bruising, no bleeding  ED Course: Discussed with ED provider, patient requiring hospitalization due to pancytopenia and severe thrombocytopenia.  Vitals in the ED was afebrile initially with 99 Fahrenheit, respiration 22, heart rate 89, blood pressure 108/60, satting at 98% on room air.  Lactic acid ordered in the ED was initially 1.8 it improved to 0.9.  Chest x-ray ordered by EDP was negative.  Assessment/Plan  Principal Problem:   Pancytopenia ([place]) Active Problems:   Weight loss   AIDS (acquired immune deficiency syndrome) (H[facility])   Need for pneumocystis prophylaxis   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[facility])   Chemotherapy-induced neutropenia ([facility])   Debility   Protein malnutrition ([facility])   Pancytopenia Severe thrombocytopenia -At bedside patient is neurologically intact following and follows commands, at this time no CT of the head without contrast is indicated -If patient's mental status changes, will order CT of the head -1 unit of platelets and PRBC has been ordered by ED provider -We will repeat CBC upon completion scheduled at 2000 -Oncology will follow as a consult, Dr. Tasia Catchings  Neutropenic fever -Status post vancomycin, cefepime, metronidazole per EDP -We will continue broad-spectrum antibiotic -Blood cultures x2 -UA complete ordered and pending collection -Infectious disease, Dr. Delaine Lame has been consulted   Protein malnutrition-dietary has been consulted  AIDS-resumed home [place] and PCP prophylaxis, Bactrim  As needed meds: Ondansetron and acetaminophen  Chart reviewed.   DVT prophylaxis: TED hose Code Status: Full code Diet: Regular Family Communication: No Disposition Plan: Pending clinical course  Consults called: Infectious disease and medical oncology Admission status: Progressive cardiac,  observation  Past Medical History:  Diagnosis Date  . Celiac disease   . 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 HIV (human immunodeficiency virus infection) ([place])   . Lyme disease   . Lymphoma [facility])    Past Surgical History:  Procedure Laterality Date  . PORTA CATH INSERTION N/A 04/25/2020   Procedure: PORTA CATH INSERTION;  Surgeon: Algernon Huxley, MD;  Location: [facility];  Service: Cardiovascular;  Laterality: N/A;   Social History:  reports that he has never smoked. He has never used smokeless tobacco. He reports current alcohol use. He reports that he does not use drugs.  No Known Allergies Family History  Problem Relation Age of Onset  . Hyperlipidemia Mother   . Brain cancer Father    Family history: Family history reviewed and not pertinent  Prior to Admission medications   Medication Sig Start Date End Date Taking? Authorizing Provider  acyclovir (ZOVIRAX) 400 MG tablet Take 1 tablet (400 mg total) by mouth 2 (two) times daily. 06/06/20   Earlie Server, MD  bictegravir-emtricitabine-tenofovir AF (BIKTARVY) 50-200-25 MG TABS tablet Take 1 tablet by mouth daily. 05/18/20   Mignon Pine, DO  chlorhexidine (PERIDEX) 0.12 % solution Use as directed 15 mLs in the mouth or throat 2 (two) times daily. 09/29/20   Earlie Server, MD  ciprofloxacin (CIPRO) 500 MG tablet Take 1 tablet (500 mg total) by mouth 2 (two) times daily. 09/30/20   Earlie Server, MD  gabapentin (NEURONTIN) 100 MG capsule Take 1 capsule (100 mg total) by mouth 3 (three) times daily. 09/19/20   Earlie Server, MD  hydrocortisone (ANUSOL-HC) 2.5 % rectal cream Place 1 application rectally 2 (two) times daily. For up to 1-2 weeks. As needed hemorrhoids 08/23/20   Olin Hauser, DO  lidocaine-prilocaine (EMLA) cream Apply to affected area once 06/06/20   Earlie Server, MD  magic mouthwash w/lidocaine SOLN Take 5 mLs by mouth 4 (four) times daily as needed for mouth pain. Sig: Swish/spit 5-10 ml four times a day as  needed 05/31/20   Earlie Server, MD  ondansetron (ZOFRAN) 8 MG tablet Take 1 tablet (8 mg total) by mouth 2 (two) times daily as needed for refractory nausea / vomiting. Start on the third day after chemotherapy. 06/06/20   Earlie Server, MD  polyethylene glycol powder [facility]) 17 GM/SCOOP powder Take by mouth as needed.    [provider]  predniSONE (DELTASONE) 50 MG tablet Take 2 tablets on Day 2, 3, 4, 5 of each chemotherapy treatments. 08/09/20   Earlie Server, MD  prochlorperazine (COMPAZINE) 10 MG tablet Take 1 tablet (10 mg total) by mouth every 6 (six) hours as needed (Nausea or vomiting). Patient not taking: No sig reported 06/06/20   Earlie Server, MD  sulfamethoxazole-trimethoprim (BACTRIM DS) 800-160 MG tablet Take 1 tablet by mouth daily. 09/13/20   Mignon Pine, DO   Physical Exam: Vitals:   10/04/20 1255 10/04/20 1523 10/04/20 1632 10/04/20 1645  BP:  108/60 (!) 103/53 (!) 102/53  Pulse:  89 86 83  Resp:  (!) 22 20 (!) 23  Temp: 99 F (37.2 C)  (!) 100.5 F (38.1 C)   TempSrc: Oral  Oral   SpO2:  98%  99%  Weight:       Constitutional: appears age appropriate, frail, cachectic, NAD, calm, comfortable Eyes: PERRL, lids and conjunctivae normal ENMT: Mucous membranes are moist. Posterior pharynx  clear of any exudate or lesions. Age-appropriate dentition. Hearing appropriate Neck: normal, supple, no masses, no thyromegaly Respiratory: clear to auscultation bilaterally, no wheezing, no crackles. Normal respiratory effort. No accessory muscle use.  Cardiovascular: Regular rate and rhythm, no murmurs / rubs / gallops. No extremity edema. 2+ pedal pulses. No carotid bruits.  Abdomen: no tenderness, no masses palpated, no hepatosplenomegaly. Bowel sounds positive.  Musculoskeletal: no clubbing / cyanosis. No joint deformity upper and lower extremities. Good ROM, no contractures, no atrophy. Normal muscle tone.  Skin: no rashes, lesions, ulcers. No induration Neurologic: Sensation  intact. Strength 5/5 in all 4.  Psychiatric: Normal judgment and insight. Alert and oriented x 3. Normal mood.   EKG: Not indicated  Chest x-ray on Admission: I personally reviewed and I agree with radiologist reading as below.  DG Chest 2 View  Result Date: 10/04/2020 CLINICAL DATA:  Sepsis EXAM: CHEST - 2 VIEW COMPARISON:  03/30/2020 FINDINGS: Heart size is normal. Mediastinal shadows are normal. The lungs are clear. Power port on the right with the tip in the SVC. No significant bone finding. IMPRESSION: No active disease. Power port on the right. Electronically Signed   By: Nelson Chimes M.D.   On: 10/04/2020 13:54   Labs on Admission: I have personally reviewed following labs  CBC: Recent Labs  Lab 09/28/20 0915 09/29/20 1027 10/04/20 1111 10/04/20 1301  WBC 3.0* 0.7* 0.2* 0.2*  NEUTROABS  --  0.5* 0.1* 0.1*  HGB 7.5* 7.3* 6.1* 5.5*  HCT 23.0* 23.3* 18.3* 16.8*  MCV 101.3* 101.7* 97.3 98.8  PLT 77* 45* 8* 7*   Basic Metabolic Panel: Recent Labs  Lab 09/29/20 1027 10/04/20 1111 10/04/20 1301  NA 139 136 133*  K 4.3 3.9 3.9  CL 106 104 104  CO2 23 22 21*  GLUCOSE 102* 141* 131*  BUN 21 30* 34*  CREATININE 0.90 1.01 0.94  CALCIUM 8.4* 8.8* 8.7*   GFR: Estimated Creatinine Clearance: 70.6 mL/min (by C-G formula based on SCr of 0.94 mg/dL).  Liver Function Tests: Recent Labs  Lab 09/29/20 1027 10/04/20 1111 10/04/20 1301  AST 16 20 26   ALT 17 22 24   ALKPHOS 81 62 67  BILITOT 0.9 0.7 0.7  PROT 6.0* 6.6 6.8  ALBUMIN 3.4* 3.3* 3.4*   Coagulation Profile: Recent Labs  Lab 09/28/20 0915 10/04/20 1301  INR 1.0 1.1   Urine analysis:    Component Value Date/Time   COLORURINE DARK YELLOW 05/05/2020 0956   APPEARANCEUR CLEAR 05/05/2020 0956   LABSPEC 1.023 05/05/2020 0956   PHURINE 6.0 05/05/2020 0956   GLUCOSEU NEGATIVE 05/05/2020 0956   HGBUR NEGATIVE 05/05/2020 0956   KETONESUR TRACE (A) 05/05/2020 0956   PROTEINUR TRACE (A) 05/05/2020 0956   NITRITE  NEGATIVE 05/05/2020 0956   LEUKOCYTESUR NEGATIVE 05/05/2020 0956    Ducre N Rosaline Ezekiel D.O. [facility]  If 7PM-7AM, please contact overnight-coverage provider If 7AM-7PM, please contact day coverage provider [email or web address]  10/04/2020, 5:01 PM

## 2020-10-04 NOTE — ED Notes (Signed)
Pt presents to ED from cancer center due to low platelet count and low WBC count. Pt has a HX of cancer and states has been getting chemo. Pt states temperture at cancer center was (101.4). Pt states he usually feels weak and feels no different then chemo. Pt is A&Ox4. Pt is A&Ox4. NAD noted. BP slightly on the low side at this time. Pt denies chest pain or SOB at this time.

## 2020-10-04 NOTE — [place] Notes (Signed)
Pharmacy Antibiotic Note  Justin Wallace is a 75 y.o. male admitted on 10/04/2020. Pharmacy has been consulted for vancomycin and cefepime dosing.  Plan: Vancomycin 1000 mg IV x 1 given in ED. Follow with vancomycin 750 mg IV Q 12 hrs.  Goal AUC 400-550 Expected AUC: 470 SCr used: 0.94   Weight: 70.3 kg (155 lb)  Temp (24hrs), Avg:99.6 F (37.6 C), Min:97.8 F (36.6 C), Max:101.2 F (38.4 C)  Recent Labs  Lab 09/28/20 0915 09/29/20 1027 10/04/20 1111 10/04/20 1301 10/04/20 1503  WBC 3.0* 0.7* 0.2* 0.2*  --   CREATININE  --  0.90 1.01 0.94  --   LATICACIDVEN  --   --   --  1.8 0.9    Estimated Creatinine Clearance: 70.6 mL/min (by C-G formula based on SCr of 0.94 mg/dL).    No Known Allergies  Antimicrobials this admission: Vancomycin 3/8 >> Cefepime 3/8 >> Metronidazole 3/8 >>  Microbiology results: 3/8 BCx: pending  Thank you for allowing pharmacy to be a part of this patient's care.  Tawnya Crook, PharmD 10/04/2020 4:50 PM

## 2020-10-04 NOTE — [place] Notes (Signed)
Patient presented to clinic for malaise, weakness since Wednesday, gradually worsening. Found to be febrile 101.2, HR 106, BP 99/59. Labs revealed pancytopenia. Discussed with Dr. Tasia Catchings who recommends ER for evaluation and management. Patient and family agreeable. Patient transferred to ER by nursing with port accessed. Report given to ER Triage Nurse.

## 2020-10-04 NOTE — ED Notes (Signed)
Report to Nicole, RN

## 2020-10-04 NOTE — ED Notes (Signed)
Paper blood consent discussed and signed by this RN and pt, MD aware.

## 2020-10-04 NOTE — ED Notes (Signed)
Platelets done infusing at this time. RBC infusing at this time.

## 2020-10-04 NOTE — Consult Note (Addendum)
NAME: Justin Wallace  DOB: 04-15-1946  MRN: [id number]  Date/Time: 10/04/2020 4:08 PM  REQUESTING PROVIDER: Dr. Tobie Poet Subjective:  REASON FOR CONSULT: AIDS, B-cell lymphoma, neutropenic fever ?Chart reviewed.  Spoke to wife and patient. Justin Wallace is a 75 y.o. 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  His last chemotherapy on 09/23/2020 with cyclophosphamide 2/22 ,2/23 & 09/22/2020 doxorubicin/vincristine.  His last rituximab was on 09/19/2020. He presented to the oncology clinic today with malaise weakness since last week Wednesday.  It was gradually worsening.  In the clinic he was found to be febrile with temperature of 101.2, heart rate 106, BP 99/59.  Labs revealed severe pancytopenia.  He was sent to the emergency department. In the ED temp 97.9, BP 109/54, heart rate 84, respiratory rate 20, sats 99% on 2 L WBC 0.2, Hb 5.5, platelets 7 and creatinine 0.94. Lactate was 1.8 Chest x-ray normal UA normal Blood culture was sent Was started on vancomycin cefepime and metronidazole. I am asked to see the patient for neutropenic fever. Patient also has AIDS which was diagnosed recently along with a B-cell lymphoma and is on Biktarvy.  He is followed at [facility].  Last viral load in December was 130 and CD4 count was 231.  Medical history In August 2021  he presented to his new PCP for fatigue, weight loss, anemia and was referred to oncologist for anemia.  The oncologist detected axillary mass and CAT scan showed lymph node enlargement.  Dr. Tasia Catchings ordered work-up for cat scratch disease, Lyme antibody was positive with serology of 2.48 and both IgM and IgG were positive.  He was treated with 3 weeks of doxycycline. 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  Port was placed on 04/25/2020.  HIV viral load done on 05/05/2020 was 642,000 and CD4 was 64 with 14% T cells.  He had a bone marrow biopsy on 05/02/2020 and that showed hypercellular bone marrow with trilineage hematopoiesis associated with slight polyclonal plasmacytosis. He was connected to HIV care at our [facility] on 05/18/2020 and saw Dr. Juleen China and was started on [place]. He started chemotherapy on 06/01/2020 with prednisone rituximab, etoposide, doxorubicin, vincristine, cyclophosphamide and intrathecal methotrexate for empiric CNS treatment.Marland Kitchen  He was also started on Bactrim and acyclovir as prophylaxis along with allopurinol.    Past Medical History:  Diagnosis Date  . Celiac disease   . 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 HIV (human immunodeficiency virus infection) ([place])   . Lyme disease   . Lymphoma [facility])     Past Surgical History:  Procedure Laterality Date  . PORTA CATH INSERTION N/A 04/25/2020   Procedure: PORTA CATH INSERTION;  Surgeon: Algernon Huxley, MD;  Location: [facility];  Service: Cardiovascular;  Laterality: N/A;    Social History   Socioeconomic History  . Marital status: Married    Spouse name: Opal Sidles   . Number of children: 2  . Years of education: Not on file  . Highest education level: Not on file  Occupational History  . Not on file  Tobacco Use  . Smoking status: Never Smoker  . Smokeless tobacco: Never Used  Vaping Use  . Vaping Use: Never used  Substance  and Sexual Activity  . Alcohol use: Yes    Comment: occasional  . Drug use: Never  . Sexual activity: Never  Other Topics Concern  . Not on file  Social History Narrative  . Not on file   Social Determinants of Health   Financial Resource Strain: Not on file  Food Insecurity: Not on file  Transportation Needs: Not on file  Physical Activity: Not on file  Stress: Not on file  Social Connections: Not on file   Intimate Partner Violence: Not on file    Family History  Problem Relation Age of Onset  . Hyperlipidemia Mother   . Brain cancer Father    No Known Allergies I? Current Facility-Administered Medications  Medication Dose Route Frequency Provider Last Rate Last Admin  . 0.9 %  sodium chloride infusion  10 mL/hr Intravenous Once Delman Kitten, MD      . 0.9 %  sodium chloride infusion  10 mL/hr Intravenous Once Delman Kitten, MD      . metroNIDAZOLE (FLAGYL) IVPB 500 mg  500 mg Intravenous Once Delman Kitten, MD 100 mL/hr at 10/04/20 1547 500 mg at 10/04/20 1547  . vancomycin (VANCOCIN) IVPB 1000 mg/200 mL premix  1,000 mg Intravenous Once Delman Kitten, MD       Current Outpatient Medications  Medication Sig Dispense Refill  . acyclovir (ZOVIRAX) 400 MG tablet Take 1 tablet (400 mg total) by mouth 2 (two) times daily. 60 tablet 5  . bictegravir-emtricitabine-tenofovir AF (BIKTARVY) 50-200-25 MG TABS tablet Take 1 tablet by mouth daily. 30 tablet 5  . chlorhexidine (PERIDEX) 0.12 % solution Use as directed 15 mLs in the mouth or throat 2 (two) times daily. 473 mL 0  . ciprofloxacin (CIPRO) 500 MG tablet Take 1 tablet (500 mg total) by mouth 2 (two) times daily. 30 tablet 0  . gabapentin (NEURONTIN) 100 MG capsule Take 1 capsule (100 mg total) by mouth 3 (three) times daily. 90 capsule 1  . hydrocortisone (ANUSOL-HC) 2.5 % rectal cream Place 1 application rectally 2 (two) times daily. For up to 1-2 weeks. As needed hemorrhoids 30 g 2  . lidocaine-prilocaine (EMLA) cream Apply to affected area once 30 g 3  . magic mouthwash w/lidocaine SOLN Take 5 mLs by mouth 4 (four) times daily as needed for mouth pain. Sig: Swish/spit 5-10 ml four times a day as needed 480 mL 1  . ondansetron (ZOFRAN) 8 MG tablet Take 1 tablet (8 mg total) by mouth 2 (two) times daily as needed for refractory nausea / vomiting. Start on the third day after chemotherapy. 30 tablet 1  . polyethylene glycol powder  (GLYCOLAX/MIRALAX) 17 GM/SCOOP powder Take by mouth as needed.    . predniSONE (DELTASONE) 50 MG tablet Take 2 tablets on Day 2, 3, 4, 5 of each chemotherapy treatments. 8 tablet 2  . prochlorperazine (COMPAZINE) 10 MG tablet Take 1 tablet (10 mg total) by mouth every 6 (six) hours as needed (Nausea or vomiting). (Patient not taking: No sig reported) 30 tablet 1  . sulfamethoxazole-trimethoprim (BACTRIM DS) 800-160 MG tablet Take 1 tablet by mouth daily. 30 tablet 2   Facility-Administered Medications Ordered in Other Encounters  Medication Dose Route Frequency Provider Last Rate Last Admin  . methotrexate (PF) 12 mg in sodium chloride (PF) 0.9 % INTRATHECAL chemo injection   Intrathecal Once Earlie Server, MD      . methotrexate (PF) 12 mg in sodium chloride (PF) 0.9 % INTRATHECAL chemo injection   Intrathecal Once Tasia Catchings,  Talbert Cage, MD         Abtx:  Anti-infectives (From admission, onward)   Start     Dose/Rate Route Frequency Ordered Stop   10/04/20 1430  ceFEPIme (MAXIPIME) 2 g in sodium chloride 0.9 % 100 mL IVPB        2 g 200 mL/hr over 30 Minutes Intravenous  Once 10/04/20 1425 10/04/20 1551   10/04/20 1430  metroNIDAZOLE (FLAGYL) IVPB 500 mg        500 mg 100 mL/hr over 60 Minutes Intravenous  Once 10/04/20 1425     10/04/20 1430  vancomycin (VANCOCIN) IVPB 1000 mg/200 mL premix        1,000 mg 200 mL/hr over 60 Minutes Intravenous  Once 10/04/20 1425        REVIEW OF SYSTEMS:  Const:  fever, negative chills,  weight loss Eyes: negative diplopia or visual changes, negative eye pain ENT: negative coryza, negative sore throat Resp: negative cough, hemoptysis, dyspnea Cards: negative for chest pain, palpitations, lower extremity edema GU: negative for frequency, dysuria and hematuria GI: Negative for abdominal pain, diarrhea, bleeding, constipation Skin: negative for rash and pruritus Heme: negative for easy bruising and gum/nose bleeding MS: Fatigue, generalized  weakness Neurolo:negative for headaches, has dizziness, no vertigo, memory problems  Psych: negative for feelings of anxiety, depression  Endocrine: negative for thyroid, diabetes Allergy/Immunology- negative for any medication or food allergies ?  Objective:  VITALS:  BP 108/60   Pulse 89   Temp 99 F (37.2 C) (Oral)   Resp (!) 22   Wt 70.3 kg   SpO2 98%   BMI 22.89 kg/m  PHYSICAL EXAM:  General: Alert, cooperative, ill and emaciated head: Normocephalic, without obvious abnormality, atraumatic. Eyes: Conjunctivae clear, anicteric sclerae. Pupils are equal ENT Nares normal. No drainage or sinus tenderness. Lips, mucosa, and tongue normal. No Thrush Neck: Supple, symmetrical, no adenopathy, thyroid: non tender no carotid bruit and no JVD. Port on the chest wall.  No erythema or tenderness. Back: No CVA tenderness. Lungs: Clear to auscultation bilaterally. No Wheezing or Rhonchi. No rales. Heart: Regular rate and rhythm, no murmur, rub or gallop.  2 x 6 systolic murmur Abdomen: Soft, non-tender,not distended. Bowel sounds normal. No masses Extremities: atraumatic, no cyanosis. No edema. No clubbing Skin: No rashes or lesions. Or bruising Lymph: Cervical, supraclavicular normal. Neurologic: Grossly non-focal Pertinent Labs Lab Results CBC    Component Value Date/Time   WBC 0.2 (LL) 10/04/2020 1301   RBC 1.70 (L) 10/04/2020 1301   HGB 5.5 (L) 10/04/2020 1301   HCT 16.8 (L) 10/04/2020 1301   PLT 7 (LL) 10/04/2020 1301   MCV 98.8 10/04/2020 1301   MCH 32.4 10/04/2020 1301   MCHC 32.7 10/04/2020 1301   RDW 15.6 (H) 10/04/2020 1301   LYMPHSABS 0.0 (L) 10/04/2020 1301   MONOABS 0.0 (L) 10/04/2020 1301   EOSABS 0.0 10/04/2020 1301   BASOSABS 0.0 10/04/2020 1301    CMP Latest Ref Rng & Units 10/04/2020 10/04/2020 09/29/2020  Glucose 70 - 99 mg/dL 131(H) 141(H) 102(H)  BUN 8 - 23 mg/dL 34(H) 30(H) 21  Creatinine 0.61 - 1.24 mg/dL 0.94 1.01 0.90  Sodium 135 - 145 mmol/L 133(L)  136 139  Potassium 3.5 - 5.1 mmol/L 3.9 3.9 4.3  Chloride 98 - 111 mmol/L 104 104 106  CO2 22 - 32 mmol/L 21(L) 22 23  Calcium 8.9 - 10.3 mg/dL 8.7(L) 8.8(L) 8.4(L)  Total Protein 6.5 - 8.1 g/dL 6.8 6.6 6.0(L)  Total Bilirubin  0.3 - 1.2 mg/dL 0.7 0.7 0.9  Alkaline Phos 38 - 126 U/L 67 62 81  AST 15 - 41 U/L _0 ALT 0 - 44 U/L _1 Microbiology: Blood culture sent  IMAGING RESULTS:  Chest x-ray no infiltrate  ? Impression/Recommendation  Febrile neutropenia secondary to chemotherapy.  No UTI, no pneumonia.  Blood culture sent.  Agree with vancomycin, and cefepime.  We will discontinue Flagyl. If fever persist will need CNS and abdomen imaging  B-cell lymphoma on chemotherapy. Receives empiric intrathecal methotrexate And on cyclophosphamide, vincristine,  doxorubicin, rituximab  Severe pancytopenia from chemo but will also check CMV titer and MAC  Severe anemia getting blood transfusion Severe thrombocytopenia.  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  Well-controlled now.   Last viral load from December 130 and CD4 more than 200.  Continue Biktarvy.  Will check labs.  ? _Normal creatinine and normal LFTs __________________________________________________ Discussed with patient, his wife and requesting provider Note:  This document was prepared using Dragon voice recognition software and may include unintentional dictation errors.

## 2020-10-04 NOTE — Progress Notes (Signed)
PHARMACY -  BRIEF ANTIBIOTIC NOTE   Pharmacy has received consult(s) for vancomycin and cefepime from an ED provider.  The patient's profile has been reviewed for ht/wt/allergies/indication/available labs.    One time order(s) placed for vancomycin 1000 mg + cefepime 2 g  Further antibiotics/pharmacy consults should be ordered by admitting physician if indicated.                       Thank you,  Tawnya Crook, PharmD 10/04/2020  2:27 PM

## 2020-10-04 NOTE — ED Notes (Signed)
Showing platelets completed at this time however platelets had been completed prior to this nurse assuming care -- transfusing nurse did not document total volume transfued end time or post platelet vitals

## 2020-10-04 NOTE — Telephone Encounter (Signed)
Spoke to patient and offered a visit in [facility] today. Pt stated that he was not interested in a clinic visit today because he is getting his blood work done today and he may have to come back anyway for a blood transfusion. I asked patient how often he was using his Magic Mouthwash, and he stated "about 2 times a day". I encouraged patient to use the magic mouthwash more frequently, especially before meals, as it is prescribed that he can use it up to 4 times a day. I also encouraged him to utilize the chlorhexidine mouthwash in AM and PM. Patient verbalized understanding. I encouraged him to call back if no improvement.

## 2020-10-05 DIAGNOSIS — D649 Anemia, unspecified: Secondary | ICD-10-CM | POA: Diagnosis not present

## 2020-10-05 DIAGNOSIS — Z20822 Contact with and (suspected) exposure to covid-19: Secondary | ICD-10-CM | POA: Diagnosis present

## 2020-10-05 DIAGNOSIS — T451X5A Adverse effect of antineoplastic and immunosuppressive drugs, initial encounter: Secondary | ICD-10-CM | POA: Diagnosis present

## 2020-10-05 DIAGNOSIS — D701 Agranulocytosis secondary to cancer chemotherapy: Secondary | ICD-10-CM | POA: Diagnosis present

## 2020-10-05 DIAGNOSIS — I959 Hypotension, unspecified: Secondary | ICD-10-CM | POA: Diagnosis present

## 2020-10-05 DIAGNOSIS — D61818 Other pancytopenia: Secondary | ICD-10-CM | POA: Diagnosis present

## 2020-10-05 DIAGNOSIS — Z79899 Other long term (current) drug therapy: Secondary | ICD-10-CM | POA: Diagnosis not present

## 2020-10-05 DIAGNOSIS — B20 Human immunodeficiency virus [HIV] disease: Secondary | ICD-10-CM | POA: Diagnosis present

## 2020-10-05 DIAGNOSIS — D709 Neutropenia, unspecified: Secondary | ICD-10-CM | POA: Diagnosis present

## 2020-10-05 DIAGNOSIS — N400 Enlarged prostate without lower urinary tract symptoms: Secondary | ICD-10-CM | POA: Diagnosis present

## 2020-10-05 DIAGNOSIS — R5381 Other malaise: Secondary | ICD-10-CM | POA: Diagnosis present

## 2020-10-05 DIAGNOSIS — D6481 Anemia due to antineoplastic chemotherapy: Secondary | ICD-10-CM | POA: Diagnosis present

## 2020-10-05 DIAGNOSIS — R5081 Fever presenting with conditions classified elsewhere: Secondary | ICD-10-CM

## 2020-10-05 DIAGNOSIS — G620 Drug-induced polyneuropathy: Secondary | ICD-10-CM | POA: Diagnosis present

## 2020-10-05 DIAGNOSIS — E440 Moderate protein-calorie malnutrition: Secondary | ICD-10-CM | POA: Diagnosis present

## 2020-10-05 DIAGNOSIS — K1230 Oral mucositis (ulcerative), unspecified: Secondary | ICD-10-CM | POA: Diagnosis present

## 2020-10-05 DIAGNOSIS — E46 Unspecified protein-calorie malnutrition: Secondary | ICD-10-CM

## 2020-10-05 DIAGNOSIS — D696 Thrombocytopenia, unspecified: Secondary | ICD-10-CM

## 2020-10-05 DIAGNOSIS — E876 Hypokalemia: Secondary | ICD-10-CM | POA: Diagnosis present

## 2020-10-05 DIAGNOSIS — C8510 Unspecified B-cell lymphoma, unspecified site: Secondary | ICD-10-CM | POA: Diagnosis present

## 2020-10-05 DIAGNOSIS — R634 Abnormal weight loss: Secondary | ICD-10-CM | POA: Diagnosis present

## 2020-10-05 DIAGNOSIS — Z6822 Body mass index (BMI) 22.0-22.9, adult: Secondary | ICD-10-CM | POA: Diagnosis not present

## 2020-10-05 LAB — CBC
HCT: 17.6 % — ABNORMAL LOW (ref 39.0–52.0)
HCT: 20.3 % — ABNORMAL LOW (ref 39.0–52.0)
Hemoglobin: 5.9 g/dL — ABNORMAL LOW (ref 13.0–17.0)
Hemoglobin: 7.1 g/dL — ABNORMAL LOW (ref 13.0–17.0)
MCH: 32.1 pg (ref 26.0–34.0)
MCH: 31.3 pg (ref 26.0–34.0)
MCHC: 33.5 g/dL (ref 30.0–36.0)
MCHC: 35 g/dL (ref 30.0–36.0)
MCV: 95.7 fL (ref 80.0–100.0)
MCV: 89.4 fL (ref 80.0–100.0)
Platelets: 11 10*3/uL — CL (ref 150–400)
Platelets: 25 10*3/uL — CL (ref 150–400)
RBC: 1.84 MIL/uL — ABNORMAL LOW (ref 4.22–5.81)
RBC: 2.27 MIL/uL — ABNORMAL LOW (ref 4.22–5.81)
RDW: 17.1 % — ABNORMAL HIGH (ref 11.5–15.5)
RDW: 18.9 % — ABNORMAL HIGH (ref 11.5–15.5)
WBC: 0.2 10*3/uL — CL (ref 4.0–10.5)
WBC: 0.5 10*3/uL — CL (ref 4.0–10.5)
nRBC: 0 % (ref 0.0–0.2)
nRBC: 0 % (ref 0.0–0.2)

## 2020-10-05 LAB — BPAM PLATELET PHERESIS
Blood Product Expiration Date: 202203092359
ISSUE DATE / TIME: 202203081621
Unit Type and Rh: 6200

## 2020-10-05 LAB — LACTATE DEHYDROGENASE: LDH: 69 U/L — ABNORMAL LOW (ref 98–192)

## 2020-10-05 LAB — CREATININE, SERUM
Creatinine, Ser: 0.89 mg/dL (ref 0.61–1.24)
GFR, Estimated: >60 mL/min (ref >60)

## 2020-10-05 LAB — PREPARE PLATELET PHERESIS: Unit division: 0

## 2020-10-05 LAB — PREPARE RBC (CROSSMATCH)

## 2020-10-05 LAB — PROCALCITONIN: Procalcitonin: <0.1 ng/mL

## 2020-10-05 LAB — SARS CORONAVIRUS 2 (TAT 6-24 HRS): SARS Coronavirus 2: NEGATIVE

## 2020-10-05 MED ORDER — SODIUM CHLORIDE 0.9% IV SOLUTION: Freq: Once | INTRAVENOUS | Status: AC

## 2020-10-05 MED ORDER — CHLORHEXIDINE GLUCONATE 0.12 % MT SOLN
15.0000 mL | Freq: Two times a day (BID) | OROMUCOSAL | Status: DC
  Administered 2020-10-05 – 2020-10-08 (×6): 15 mL via OROMUCOSAL
  Filled 2020-10-05 (×5): qty 15

## 2020-10-05 MED ORDER — ADULT MULTIVITAMIN W/MINERALS CH
1.0000 | ORAL_TABLET | Freq: Every day | ORAL | Status: DC
  Administered 2020-10-05 – 2020-10-07 (×3): 1 via ORAL
  Filled 2020-10-05 (×3): qty 1

## 2020-10-05 MED ORDER — CHLORHEXIDINE GLUCONATE CLOTH 2 % EX PADS
6.0000 | MEDICATED_PAD | Freq: Every day | CUTANEOUS | Status: DC
  Administered 2020-10-05 – 2020-10-06 (×2): 6 via TOPICAL

## 2020-10-05 MED ORDER — ENSURE ENLIVE PO LIQD
237.0000 mL | Freq: Three times a day (TID) | ORAL | Status: DC
  Administered 2020-10-05 – 2020-10-07 (×4): 237 mL via ORAL

## 2020-10-05 NOTE — Progress Notes (Signed)
Initial Nutrition Assessment  DOCUMENTATION CODES:   Not applicable  INTERVENTION:   Ensure Enlive po TID, each supplement provides 350 kcal and 20 grams of protein  MVI po daily   Pt at high refeed risk; recommend monitor potassium, magnesium and phosphorus labs daily until stable  NUTRITION DIAGNOSIS:   Increased nutrient needs related to cancer and cancer related treatments,other (see comment) (HIV) as evidenced by estimated needs.  GOAL:   Patient will meet greater than or equal to 90% of their needs  MONITOR:   PO intake,Supplement acceptance,Labs,Weight trends,Skin,I & O's  REASON FOR ASSESSMENT:   Consult Assessment of nutrition requirement/status  ASSESSMENT:   76 y.o. male with medical history significant for AIDS, B-cell lymphoma on chemo, BPH and celiac disease who is admitted with pancytopenia and fever.  RD working remotely.  Spoke with pt via phone. Pt reports decreased appetite and oral intake at baseline. Pt reports that he has been trying to eat as much as he can and he has been drinking chocolate Boost at home. Pt reports that he did eat some breakfast and lunch today. RD will add supplements and MVI to help pt meet his estimated needs. Pt is ok to drink Ensure in hospital. Per chart, pt appears weight stable pta.   Pt is at high risk for malnutrition but unable to diagnose at this time as NFPE cannot be performed. RD will obtain NFPE at follow-up.   Medications reviewed and include: bactrim, cefepime, vancomycin   Labs reviewed: wbc- 0.2(L), Hgb 5.9(L), Hct 17.6(L)  NUTRITION - FOCUSED PHYSICAL EXAM: Unable to perform at this time   Diet Order:   Diet Order            Diet regular Room service appropriate? Yes; Fluid consistency: Thin  Diet effective now                EDUCATION NEEDS:   Education needs have been addressed  Skin:  Skin Assessment: Reviewed RN Assessment  Last BM:  3/7  Height:   Ht Readings from Last 1 Encounters:   10/04/20 5' 9"  (1.753 m)    Weight:   Wt Readings from Last 1 Encounters:  10/05/20 72.1 kg    Ideal Body Weight:  72.7 kg  BMI:  Body mass index is 23.47 kg/m.  Estimated Nutritional Needs:   Kcal:  1900-2200kcal/day  Protein:  100-110g/day  Fluid:  1.9-2.2L/day  Koleen Distance MS, RD, LDN Please refer to [facility] for RD and/or RD on-call/weekend/after hours pager

## 2020-10-05 NOTE — Progress Notes (Addendum)
[facility] Progress Note  Patient: Justin Wallace    KGM:[id number]  DOA: 10/04/2020     Date of Service: the patient was seen and examined on 10/05/2020  Chief Complaint  Patient presents with  . Fever   Brief hospital course: Pancho Rushing is a 75 y.o. male with medical history significant for AIDS, B-cell lymphoma, BPH, presented to the emergency department at the urging of his oncology clinic for chief concerns of abnormal labs and fever.  Mr. Macaraeg was at oncology clinic and was found to have a temperature of 101.2.  And platelet was low at 8, WBC of 0.2, ANC of 100.  Patient was sent to the emergency department for further evaluation.  Vaccination: Patient is fully vaccinated for COVID-19 ED Course: Discussed with ED provider, patient requiring hospitalization due to pancytopenia and severe thrombocytopenia.  Vitals in the ED was afebrile initially with 99 Fahrenheit, respiration 22, heart rate 89, blood pressure 108/60, satting at 98% on room air.  Lactic acid ordered in the ED was initially 1.8 it improved to 0.9.  Chest x-ray ordered by EDP was negative.   Assessment and Plan: Principal Problem:   Pancytopenia ([place]) Active Problems:   Weight loss   AIDS (acquired immune deficiency syndrome) ([facility])   Need for pneumocystis prophylaxis   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[facility])   Chemotherapy-induced neutropenia ([facility])   Debility   Protein malnutrition ([facility])   # Pancytopenia Severe thrombocytopenia -patient is neurologically intact following and follows commands, at this time no CT of the head without contrast is indicated -If patient's mental status changes, will order CT of the head -3/8 s/p 1 unit of platelets and PRBC given in the ED -HB 5.5--5.9 --3/9 Transfuse 2 unit of PRBC and 1 unit of platelets --Platelet count 11 K Follow oncology for further recommendation of platelet transfusion -Oncology will follow as a consult, Dr. Tasia Catchings Monitor CBC every 6 hourly, transfuse as  needed   # Neutropenic fever WBC 0.1--0.2 ANC 0.1--0.1 -Status post vancomycin, cefepime, metronidazole per EDP -We will continue broad-spectrum antibiotic -Blood cultures x2 neg x 24 hrs -UA Neg  -Infectious disease, Dr. Delaine Lame is following    # Protein malnutrition-dietary has been consulted  # AIDS-resumed home Biktarvy and PCP prophylaxis, Bactrim  As needed meds: Ondansetron and acetaminophen  Body mass index is 23.47 kg/m.        Diet: Regular DVT Prophylaxis: SCD, pharmacological prophylaxis contraindicated due to Pancytopenia risk of bleeding   Advance goals of care discussion: Full code  Family Communication: family was NOT present at bedside, at the time of interview.  The pt provided permission to discuss medical plan with the family. Opportunity was given to ask question and all questions were answered satisfactorily.   Disposition:  Pt is from home, admitted with pancytopenia, still has low platelet count, neutropenic and low hemoglobin, which precludes a safe discharge. Discharge to home, when pancytopenia improves and cleared by oncologist.  Subjective: No overnight issues, patient denied any active bleeding, no headache or dizziness.  Denied any chest pain or shortness of breath.  No abdominal pain, no nausea vomiting or diarrhea.   Physical Exam: General:  alert oriented to time, place, and person.  Appear in no distress, affect appropriate Eyes: PERRLA ENT: Oral Mucosa Clear, moist  Neck: no JVD,  Cardiovascular: S1 and S2 Present, no Murmur,  Respiratory: good respiratory effort, Bilateral Air entry equal and Decreased, no Crackles, no wheezes Abdomen: Bowel Sound present, Soft and no tenderness,  Skin: no rashes Extremities: no Pedal edema, no calf tenderness Neurologic: without any new focal findings Gait not checked due to patient safety concerns  Vitals:   10/04/20 2334 10/05/20 0336 10/05/20 0906 10/05/20 1100  BP: (!) 98/50 (!)  97/52 (!) 95/51 (!) 86/54  Pulse: 78 93 80 80  Resp: 15 15 15 15   Temp: 97.8 F (36.6 C) 100.1 F (37.8 C) 99.1 F (37.3 C) 98.8 F (37.1 C)  TempSrc:  Oral Oral Oral  SpO2: 99% 98% 99% 100%  Weight:  72.1 kg    Height:        Intake/Output Summary (Last 24 hours) at 10/05/2020 1106 Last data filed at 10/05/2020 1601 Gross per 24 hour  Intake 1412.6 ml  Output 400 ml  Net 1012.6 ml   Filed Weights   10/04/20 1252 10/04/20 2050 10/05/20 0336  Weight: 70.3 kg 72.1 kg 72.1 kg    Data Reviewed: I have personally reviewed and interpreted daily labs, tele strips, imagings as discussed above. I reviewed all nursing notes, pharmacy notes, vitals, pertinent old records I have discussed plan of care as described above with RN and patient/family.  CBC: Recent Labs  Lab 09/29/20 1027 10/04/20 1111 10/04/20 1301 10/04/20 2205 10/05/20 0938  WBC 0.7* 0.2* 0.2* 0.1* 0.2*  NEUTROABS 0.5* 0.1* 0.1* 0.1*  --   HGB 7.3* 6.1* 5.5* 5.5* 5.9*  HCT 23.3* 18.3* 16.8* 16.6* 17.6*  MCV 101.7* 97.3 98.8 95.4 95.7  PLT 45* 8* 7* 13* 11*   Basic Metabolic Panel: Recent Labs  Lab 09/29/20 1027 10/04/20 1111 10/04/20 1301 10/05/20 0536  NA 139 136 133*  --   K 4.3 3.9 3.9  --   CL 106 104 104  --   CO2 23 22 21*  --   GLUCOSE 102* 141* 131*  --   BUN 21 30* 34*  --   CREATININE 0.90 1.01 0.94 0.89  CALCIUM 8.4* 8.8* 8.7*  --     Studies: DG Chest 2 View  Result Date: 10/04/2020 CLINICAL DATA:  Sepsis EXAM: CHEST - 2 VIEW COMPARISON:  03/30/2020 FINDINGS: Heart size is normal. Mediastinal shadows are normal. The lungs are clear. Power port on the right with the tip in the SVC. No significant bone finding. IMPRESSION: No active disease. Power port on the right. Electronically Signed   By: Nelson Chimes M.D.   On: 10/04/2020 13:54    Scheduled Meds: . sodium chloride   Intravenous Once  . acyclovir  400 mg Oral BID  . bictegravir-emtricitabine-tenofovir AF  1 tablet Oral Daily  .  Chlorhexidine Gluconate Cloth  6 each Topical Daily  . gabapentin  100 mg Oral TID  . sulfamethoxazole-trimethoprim  1 tablet Oral Daily   Continuous Infusions: . ceFEPime (MAXIPIME) IV 2 g (10/05/20 0626)  . metronidazole 500 mg (10/05/20 0018)  . vancomycin 750 mg (10/05/20 0455)   PRN Meds: acetaminophen **OR** acetaminophen, magic mouthwash w/lidocaine, ondansetron **OR** ondansetron (ZOFRAN) IV, oxyCODONE  Time spent: 35 minutes  Author: Val Riles. MD [facility] 10/05/2020 11:06 AM  To reach On-call, see care teams to locate the attending and reach out to them via [email or web address]. If 7PM-7AM, please contact night-coverage If you still have difficulty reaching the attending provider, please page the [facility] (Director on Call) for [facility] on amion for assistance.

## 2020-10-05 NOTE — Consult Note (Signed)
Hematology/Oncology Consult note [facility] Telephone:([phone] [phone]  Patient Care Team: Olin Hauser, DO as PCP - General (Family Medicine) Earlie Server, MD as Consulting Physician (Hematology and Oncology)   Name of the patient: Justin Wallace  [id number]  [birth year]   Date of visit: 10/05/20 REASON FOR COSULTATION:   History of presenting illness-  75 y.o. male with PMH listed at below who was sent to the emergency room from cancer center after being found to be febrile, neutropenic with ANC of 0.1, hypotensive. 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  Patient received long-acting G-CSF Udenyca 6 mg on 09/26/2020 Patient has been on CBC twice a week for monitoring his counts.  Patient was started on prophylactic Cipro 500 twice daily since 09/29/2020.  He came to cancer center on 10/04/2020 and CBC showed ANC 0.1, also was found to have a temperature of 101.2, heart rate 106, BP 99/59.  Hemoglobin 5.6, platelet 7000.  Patient was recommended go to emergency room for further evaluation and started on empiric broad-spectrum antibiotics.  Admitted for neutropenic fever, symptomatic anemia and thrombocytopenia Lactate was 1.8,X-ray of chest was normal.  UA negative.  Blood culture is pending.  Procalcitonin less than 0.1 Patient was started on vancomycin and cefepime. Patient is on [place] for HIV treatment and is on Bactrim 3 times per week for prophylaxis.  He also takes prophylactic acyclovir.  Patient is status post 1 unit of irradiated PRBC transfusion and 1 unit of platelet transfusion overnight. Today patient continues to feel tired and fatigued. + Mild sore.  Denies any nausea vomiting diarrhea, headache, cough.  He denies chest or abdominal pain Repeat CBC showed  hemoglobin 5.6, platelet 11,000.  Denies any bleeding events.    Review of Systems  Constitutional: Positive for appetite change and fatigue. Negative for chills and fever.  HENT:   Negative for hearing loss and voice change.        Mouth sore  Eyes: Negative for eye problems and icterus.  Respiratory: Negative for chest tightness, cough and shortness of breath.   Cardiovascular: Negative for chest pain and leg swelling.  Gastrointestinal: Negative for abdominal distention and abdominal pain.  Endocrine: Negative for hot flashes.  Genitourinary: Negative for difficulty urinating, dysuria and frequency.   Musculoskeletal: Negative for arthralgias.  Skin: Negative for itching and rash.  Neurological: Negative for light-headedness and numbness.  Hematological: Negative for adenopathy. Does not bruise/bleed easily.  Psychiatric/Behavioral: Negative for confusion.    No Known Allergies  Patient Active Problem List   Diagnosis Date Noted  . Pancytopenia ([place]) 10/04/2020  . Debility 10/04/2020  . Protein malnutrition ([place]) 10/04/2020  . Stomatitis and mucositis 08/22/2020  . 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 Chemotherapy-induced neutropenia ([place]) 08/08/2020  . Encounter for antineoplastic chemotherapy 07/15/2020  . Anemia due to antineoplastic chemotherapy 06/27/2020  . Goals of care, counseling/discussion 06/13/2020  . 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[place]) 05/27/2020  . AIDS (acquired immune deficiency syndrome) ([place]) 05/18/2020  . Need for pneumocystis prophylaxis 05/18/2020  . Vaccine counseling 05/18/2020  . Lymphoma ([place]) 05/18/2020  . Health care maintenance 05/18/2020  . Lymphadenopathy 03/31/2020  . Weight loss 03/31/2020  . Axillary mass, left 03/31/2020     Past Medical History:  Diagnosis Date  . Celiac disease   . 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[place]) 05/27/2020  .  HIV (human immunodeficiency virus infection) ([place])   . Lyme disease   .  Lymphoma [facility])      Past Surgical History:  Procedure Laterality Date  . PORTA CATH INSERTION N/A 04/25/2020   Procedure: PORTA CATH INSERTION;  Surgeon: Algernon Huxley, MD;  Location: [place];  Service: Cardiovascular;  Laterality: N/A;    Social History   Socioeconomic History  . Marital status: Married    Spouse name: Opal Sidles   . Number of children: 2  . Years of education: Not on file  . Highest education level: Not on file  Occupational History  . Not on file  Tobacco Use  . Smoking status: Never Smoker  . Smokeless tobacco: Never Used  Vaping Use  . Vaping Use: Never used  Substance and Sexual Activity  . Alcohol use: Yes    Comment: occasional  . Drug use: Never  . Sexual activity: Never  Other Topics Concern  . Not on file  Social History Narrative  . Not on file   Social Determinants of Health   Financial Resource Strain: Not on file  Food Insecurity: Not on file  Transportation Needs: Not on file  Physical Activity: Not on file  Stress: Not on file  Social Connections: Not on file  Intimate Partner Violence: Not on file     Family History  Problem Relation Age of Onset  . Hyperlipidemia Mother   . Brain cancer Father      Current Facility-Administered Medications:  .  acetaminophen (TYLENOL) tablet 650 mg, 650 mg, Oral, Q6H PRN, 650 mg at 10/04/20 1653 **OR** acetaminophen (TYLENOL) suppository 650 mg, 650 mg, Rectal, Q6H PRN, Cox, Amy N, DO .  acyclovir (ZOVIRAX) 200 MG capsule 400 mg, 400 mg, Oral, BID, Dorothe Pea, RPH, 400 mg at 10/04/20 2220 .  bictegravir-emtricitabine-tenofovir AF (BIKTARVY) 50-200-25 MG per tablet 1 tablet, 1 tablet, Oral, Daily, Cox, Amy N, DO .  ceFEPIme (MAXIPIME) 2 g in sodium chloride 0.9 % 100 mL IVPB, 2 g, Intravenous, Q8H, Cox, Amy N, DO, Last Rate: 200 mL/hr at 10/05/20 0626, 2 g at 10/05/20 0626 .  Chlorhexidine Gluconate Cloth 2 % PADS 6 each, 6 each, Topical, Daily, Cox, Amy N, DO .  gabapentin  (NEURONTIN) capsule 100 mg, 100 mg, Oral, TID, Cox, Amy N, DO, 100 mg at 10/04/20 2220 .  magic mouthwash w/lidocaine, 5 mL, Oral, QID PRN, Cox, Amy N, DO .  metroNIDAZOLE (FLAGYL) IVPB 500 mg, 500 mg, Intravenous, Q8H, Cox, Amy N, DO, Last Rate: 100 mL/hr at 10/05/20 0018, 500 mg at 10/05/20 0018 .  ondansetron (ZOFRAN) tablet 4 mg, 4 mg, Oral, Q6H PRN **OR** ondansetron (ZOFRAN) injection 4 mg, 4 mg, Intravenous, Q6H PRN, Cox, Amy N, DO .  oxyCODONE (Oxy IR/ROXICODONE) immediate release tablet 5 mg, 5 mg, Oral, Q4H PRN, Cox, Amy N, DO .  sulfamethoxazole-trimethoprim (BACTRIM DS) 800-160 MG per tablet 1 tablet, 1 tablet, Oral, Daily, Cox, Amy N, DO .  vancomycin (VANCOREADY) IVPB 750 mg/150 mL, 750 mg, Intravenous, Q12H, Cox, Amy N, DO, Last Rate: 150 mL/hr at 10/05/20 0455, 750 mg at 10/05/20 0455  Facility-Administered Medications Ordered in Other Encounters:  .  methotrexate (PF) 12 mg in sodium chloride (PF) 0.9 % INTRATHECAL chemo injection, , Intrathecal, Once, Earlie Server, MD .  methotrexate (PF) 12 mg in sodium chloride (PF) 0.9 % INTRATHECAL chemo injection, , Intrathecal, Once, Earlie Server, MD   Physical exam:  Vitals:   10/04/20 2013 10/04/20  2050 10/04/20 2334 10/05/20 0336  BP: (!) 97/51 (!) 109/54 (!) 98/50 (!) 97/52  Pulse: 77 84 78 93  Resp: _0 Temp: 99.3 F (37.4 C) 97.9 F (36.6 C) 97.8 F (36.6 C) 100.1 F (37.8 C)  TempSrc: Oral Oral  Oral  SpO2: 99% 99% 99% 98%  Weight:  158 lb 14.4 oz (72.1 kg)  158 lb 14.4 oz (72.1 kg)  Height:  _1  (1.753 m)     Physical Exam Constitutional:      General: He is not in acute distress.    Appearance: He is not diaphoretic.  HENT:     Head: Normocephalic and atraumatic.     Nose: Nose normal.     Mouth/Throat:     Mouth: Oropharynx is clear and moist.     Pharynx: No oropharyngeal exudate.     Comments: Mucositis Eyes:     General: No scleral icterus.    Extraocular Movements: EOM normal.     Pupils: Pupils are  equal, round, and reactive to light.  Cardiovascular:     Rate and Rhythm: Normal rate and regular rhythm.     Heart sounds: No murmur heard.   Pulmonary:     Effort: Pulmonary effort is normal. No respiratory distress.     Breath sounds: No rales.  Chest:     Chest wall: No tenderness.  Abdominal:     General: There is no distension.     Palpations: Abdomen is soft.     Tenderness: There is no abdominal tenderness.  Musculoskeletal:        General: No edema. Normal range of motion.     Cervical back: Normal range of motion and neck supple.  Skin:    General: Skin is warm and dry.     Coloration: Skin is pale.     Findings: No erythema.  Neurological:     Mental Status: He is alert and oriented to person, place, and time.     Cranial Nerves: No cranial nerve deficit.     Motor: No abnormal muscle tone.     Coordination: Coordination normal.  Psychiatric:        Mood and Affect: Affect normal.         CMP Latest Ref Rng & Units 10/05/2020  Glucose 70 - 99 mg/dL -  BUN 8 - 23 mg/dL -  Creatinine 0.61 - 1.24 mg/dL 0.89  Sodium 135 - 145 mmol/L -  Potassium 3.5 - 5.1 mmol/L -  Chloride 98 - 111 mmol/L -  CO2 22 - 32 mmol/L -  Calcium 8.9 - 10.3 mg/dL -  Total Protein 6.5 - 8.1 g/dL -  Total Bilirubin 0.3 - 1.2 mg/dL -  Alkaline Phos 38 - 126 U/L -  AST 15 - 41 U/L -  ALT 0 - 44 U/L -   CBC Latest Ref Rng & Units 10/04/2020  WBC 4.0 - 10.5 K/uL 0.1(LL)  Hemoglobin 13.0 - 17.0 g/dL 5.5(L)  Hematocrit 39.0 - 52.0 % 16.6(L)  Platelets 150 - 400 K/uL 13(LL)    RADIOGRAPHIC STUDIES: I have personally reviewed the radiological images as listed and agreed with the findings in the report. DG Chest 2 View  Result Date: 10/04/2020 CLINICAL DATA:  Sepsis EXAM: CHEST - 2 VIEW COMPARISON:  03/30/2020 FINDINGS: Heart size is normal. Mediastinal shadows are normal. The lungs are clear. Power port on the right with the tip in the SVC. No significant bone finding. IMPRESSION: No  active disease. Power port on the right. Electronically Signed   By: Nelson Chimes M.D.   On: 10/04/2020 13:54   DG FLUORO GUIDED LOC OF NEEDLE/CATH TIP FOR SPINAL INJECT LT  Result Date: 09/28/2020 CLINICAL DATA:  Intrathecal methotrexate for high-grade B-cell lymphoma EXAM: DIAGNOSTIC LUMBAR PUNCTURE UNDER FLUOROSCOPIC GUIDANCE COMPARISON:  09/07/2020 FLUOROSCOPY TIME:  Fluoroscopy Time:  0.1 minute Radiation Exposure Index (if provided by the fluoroscopic device): 0.2 mGy Number of Acquired Spot Images: 0 PROCEDURE: Informed consent was obtained from the patient prior to the procedure, including potential complications of headache, allergy, and pain. With the patient prone, the lower back was prepped with Betadine. 1% Lidocaine was used for local anesthesia. Lumbar puncture was performed at the L2-3 level using a 22 gauge needle with return of clear CSF. 12 mg of methotrexate was injected into the subarachnoid space. The patient tolerated the procedure well and there were no apparent complications. No immediate complications. IMPRESSION: Successful fluoroscopic guided lumbar puncture for intrathecal chemotherapy. Electronically Signed   By: Kathreen Devoid   On: 09/28/2020 13:12   DG FLUORO GUIDED LOC OF NEEDLE/CATH TIP FOR SPINAL INJECT LT  Result Date: 09/07/2020 CLINICAL DATA:  Intrathecal methotrexate for high-grade B-cell lymphoma EXAM: DIAGNOSTIC LUMBAR PUNCTURE UNDER FLUOROSCOPIC GUIDANCE FLUOROSCOPY TIME:  Fluoroscopy Time:  0.2 minute Radiation Exposure Index (if provided by the fluoroscopic device): 0.2 mGy Number of Acquired Spot Images: 0 PROCEDURE: Informed consent was obtained from the patient prior to the procedure, including potential complications of headache, allergy, and pain. With the patient prone, the lower back was prepped with Betadine. 1% Lidocaine was used for local anesthesia. Lumbar puncture was performed at the L2-3 level using a 22 gauge needle with return of clear CSF. 12 mg of  methotrexate was injected into the subarachnoid space. The patient tolerated the procedure well and there were no apparent complications. No immediate complications. IMPRESSION: Successful fluoroscopic guided lumbar puncture for intrathecal chemotherapy. Electronically Signed   By: Kathreen Devoid   On: 09/07/2020 12:07    Assessment and plan-   #Neutropenic fever Blood culture no growth, x-ray normal.  UA negative Continue broad-spectrum antibiotics vancomycin and cefepime. Continue antibiotics until [place] is >0.5 ID recommendation reviewed.  Dr. Steva Ready has sent additional lab testing including CMV titer, MAC, parvovirus, EBV  #High-grade B-cell lymphoma on chemotherapy.  He has finished cycle 6 dose adjusted EPOCH. Interim PET scan showed good response /complete remission Follow-up outpatient at the cancer center.  #Symptomatic anemia, chemotherapy-induced anemia Transfuse 2 unit of irradiated PRBC.  Keep hemoglobin>=7  #Thrombocytopenia, in the context of febrile neutropenia, recommend to transfuse 1 unit of platelet, to keep platelet>= 20,000 #HIV, continue current treatment. #Mucositis, due to neutropenia.  Continue acyclovir, Magic mouthwash and chlorhexidine oral rinse.  Thank you for allowing me to participate in the care of this patient.    Earlie Server, MD, PhD Hematology Oncology [facility] Pager- [phone] 10/05/2020

## 2020-10-05 NOTE — Progress Notes (Signed)
ID   Date of Admission:  10/04/2020     ID: Justin Wallace is a 75 y.o. male  Principal Problem:   Pancytopenia ([place]) Active Problems:   Weight loss   AIDS (acquired immune deficiency syndrome) ([place])   Need for pneumocystis prophylaxis   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HCC)   Chemotherapy-induced neutropenia (HCC)   Debility   Protein malnutrition (HCC)    Subjective: Pt doing better Appetite poor   Medications:  . sodium chloride   Intravenous Once  . sodium chloride   Intravenous Once  . acyclovir  400 mg Oral BID  . bictegravir-emtricitabine-tenofovir AF  1 tablet Oral Daily  . Chlorhexidine Gluconate Cloth  6 each Topical Daily  . gabapentin  100 mg Oral TID  . sulfamethoxazole-trimethoprim  1 tablet Oral Daily    Objective: Vital signs in last 24 hours: Temp:  [97.8 F (36.6 C)-100.5 F (38.1 C)] 99.7 F (37.6 C) (03/09 1225) Pulse Rate:  [71-99] 71 (03/09 1225) Resp:  [15-25] 20 (03/09 1157) BP: (86-109)/(50-60) 92/54 (03/09 1225) SpO2:  [96 %-100 %] 96 % (03/09 1157) Weight:  [70.3 kg-72.1 kg] 72.1 kg (03/09 0336)  PHYSICAL EXAM:  General: Alert, cooperative, no distress,  Head: Normocephalic, without obvious abnormality, atraumatic. Eyes: Conjunctivae clear, anicteric sclerae. Pupils are equal ENT Nares normal. No drainage or sinus tenderness. Lips, mucosa, and tongue normal. No Thrush Neck: Supple, symmetrical, no adenopathy, thyroid: non tender no carotid bruit and no JVD. Back: No CVA tenderness. Lungs: Clear to auscultation bilaterally. No Wheezing or Rhonchi. No rales. Heart: Regular rate and rhythm, no murmur, rub or gallop. Abdomen: Soft, non-tender,not distended. Bowel sounds normal. No masses Extremities: atraumatic, no cyanosis. No edema. No clubbing Skin: No rashes or lesions. Or bruising Lymph: Cervical, supraclavicular normal. Neurologic: Grossly non-focal  Lab Results Recent Labs    10/04/20 1111 10/04/20 1301 10/04/20 2205 10/05/20 0536  10/05/20 0938  WBC 0.2* 0.2* 0.1*  --  0.2*  HGB 6.1* 5.5* 5.5*  --  5.9*  HCT 18.3* 16.8* 16.6*  --  17.6*  NA 136 133*  --   --   --   K 3.9 3.9  --   --   --   CL 104 104  --   --   --   CO2 22 21*  --   --   --   BUN 30* 34*  --   --   --   CREATININE 1.01 0.94  --  0.89  --    Liver Panel Recent Labs    10/04/20 1111 10/04/20 1301  PROT 6.6 6.8  ALBUMIN 3.3* 3.4*  AST 20 26  ALT 22 24  ALKPHOS 62 67  BILITOT 0.7 0.7   Sedimentation Rate No results for input(s): ESRSEDRATE in the last 72 hours. C-Reactive Protein No results for input(s): CRP in the last 72 hours.  Microbiology:  Studies/Results: DG Chest 2 View  Result Date: 10/04/2020 CLINICAL DATA:  Sepsis EXAM: CHEST - 2 VIEW COMPARISON:  03/30/2020 FINDINGS: Heart size is normal. Mediastinal shadows are normal. The lungs are clear. Power port on the right with the tip in the SVC. No significant bone finding. IMPRESSION: No active disease. Power port on the right. Electronically Signed   By: Nelson Chimes M.D.   On: 10/04/2020 13:54     Assessment/Plan: Febrile neutropenia secondary to chemotherapy.  No UTI or pneumonia.  Blood culture negative so far.  Currently on vancomycin, metronidazole and cefepime. We will discontinue metronidazole.  Patient is on Bactrim for PCP prophylaxis. Also on acyclovir for herpes prophylaxis.   B-cell lymphoma on chemotherapy Receives intrathecal methotrexate for empiric treatment Also on cyclophosphamide, vincristine, doxorubicin and rituximab.Marland Kitchen He gets it every 3 weeks.  Last dose was in February.  Severe pancytopenia.  Very likely from chemotherapy but will also check other opportunistic infections like MAC and CMV.  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  Started on Biktarvy October 2021.  Viral load decreased from 600000 s to 130.  CD4 has increased from 64>.224  His HIV is under good control with Biktarvy.    Severe anemia - blood transfusion  Severe  thrombocytopenia  Discussed the management with the patient

## 2020-10-06 ENCOUNTER — Inpatient Hospital Stay: Payer: Medicare HMO

## 2020-10-06 ENCOUNTER — Other Ambulatory Visit: Payer: Self-pay

## 2020-10-06 DIAGNOSIS — R5081 Fever presenting with conditions classified elsewhere: Secondary | ICD-10-CM | POA: Diagnosis not present

## 2020-10-06 DIAGNOSIS — B20 Human immunodeficiency virus [HIV] disease: Secondary | ICD-10-CM

## 2020-10-06 DIAGNOSIS — D709 Neutropenia, unspecified: Secondary | ICD-10-CM | POA: Diagnosis not present

## 2020-10-06 LAB — HELPER T-LYMPH-CD4 (ARMC ONLY)
Absolute CD 4 Helper: UNDETERMINED /uL
Basophils Absolute: 0 10*3/uL (ref 0.0–0.2)
Basos: 0 %
EOS (ABSOLUTE): 0 10*3/uL (ref 0.0–0.4)
Eos: 14 %
Hematocrit: 16.4 % — CL (ref 37.5–51.0)
Hemoglobin: 5.6 g/dL — CL (ref 13.0–17.7)
Lymphocytes Absolute: 0 10*3/uL — ABNORMAL LOW (ref 0.7–3.1)
Lymphs: 7 %
MCH: 32.6 pg (ref 26.6–33.0)
MCHC: 34.1 g/dL (ref 31.5–35.7)
MCV: 95 fL (ref 79–97)
Monocytes Absolute: 0 10*3/uL — ABNORMAL LOW (ref 0.1–0.9)
Monocytes: 21 %
Neutrophils Absolute: 0.1 10*3/uL — CL (ref 1.4–7.0)
Neutrophils: 58 %
Platelets: 14 10*3/uL — CL (ref 150–450)
RBC: 1.72 x10E6/uL — ABNORMAL LOW (ref 4.14–5.80)
RDW: 16.6 % — ABNORMAL HIGH (ref 11.6–15.4)
WBC: 0.2 10*3/uL — CL (ref 3.4–10.8)

## 2020-10-06 LAB — CBC WITH DIFFERENTIAL/PLATELET
Abs Immature Granulocytes: 0.01 10*3/uL (ref 0.00–0.07)
Basophils Absolute: 0 10*3/uL (ref 0.0–0.1)
Basophils Relative: 2 %
Eosinophils Absolute: 0 10*3/uL (ref 0.0–0.5)
Eosinophils Relative: 3 %
HCT: 21.1 % — ABNORMAL LOW (ref 39.0–52.0)
Hemoglobin: 7.2 g/dL — ABNORMAL LOW (ref 13.0–17.0)
Immature Granulocytes: 2 %
Lymphocytes Relative: 6 %
Lymphs Abs: 0 10*3/uL — ABNORMAL LOW (ref 0.7–4.0)
MCH: 30.8 pg (ref 26.0–34.0)
MCHC: 34.1 g/dL (ref 30.0–36.0)
MCV: 90.2 fL (ref 80.0–100.0)
Monocytes Absolute: 0.2 10*3/uL (ref 0.1–1.0)
Monocytes Relative: 25 %
Neutro Abs: 0.4 10*3/uL — CL (ref 1.7–7.7)
Neutrophils Relative %: 62 %
Platelets: 28 10*3/uL — CL (ref 150–400)
RBC: 2.34 MIL/uL — ABNORMAL LOW (ref 4.22–5.81)
RDW: 18.9 % — ABNORMAL HIGH (ref 11.5–15.5)
Smear Review: NORMAL
WBC: 0.6 10*3/uL — CL (ref 4.0–10.5)
nRBC: 4.7 % — ABNORMAL HIGH (ref 0.0–0.2)

## 2020-10-06 LAB — CBC
HCT: 20.6 % — ABNORMAL LOW (ref 39.0–52.0)
Hemoglobin: 6.9 g/dL — ABNORMAL LOW (ref 13.0–17.0)
MCH: 30.3 pg (ref 26.0–34.0)
MCHC: 33.5 g/dL (ref 30.0–36.0)
MCV: 90.4 fL (ref 80.0–100.0)
Platelets: 26 10*3/uL — CL (ref 150–400)
RBC: 2.28 MIL/uL — ABNORMAL LOW (ref 4.22–5.81)
RDW: 19.1 % — ABNORMAL HIGH (ref 11.5–15.5)
WBC: 0.8 10*3/uL — CL (ref 4.0–10.5)
nRBC: 0 % (ref 0.0–0.2)

## 2020-10-06 LAB — MAGNESIUM: Magnesium: 1.8 mg/dL (ref 1.7–2.4)

## 2020-10-06 LAB — HEPATIC FUNCTION PANEL
ALT: 23 U/L (ref 0–44)
AST: 17 U/L (ref 15–41)
Albumin: 2.8 g/dL — ABNORMAL LOW (ref 3.5–5.0)
Alkaline Phosphatase: 52 U/L (ref 38–126)
Bilirubin, Direct: 0.2 mg/dL (ref 0.0–0.2)
Indirect Bilirubin: 0.9 mg/dL (ref 0.3–0.9)
Total Bilirubin: 1.1 mg/dL (ref 0.3–1.2)
Total Protein: 5.7 g/dL — ABNORMAL LOW (ref 6.5–8.1)

## 2020-10-06 LAB — BASIC METABOLIC PANEL
Anion gap: 7 (ref 5–15)
BUN: 25 mg/dL — ABNORMAL HIGH (ref 8–23)
CO2: 21 mmol/L — ABNORMAL LOW (ref 22–32)
Calcium: 8.3 mg/dL — ABNORMAL LOW (ref 8.9–10.3)
Chloride: 108 mmol/L (ref 98–111)
Creatinine, Ser: 0.87 mg/dL (ref 0.61–1.24)
GFR, Estimated: >60 mL/min (ref >60)
Glucose, Bld: 100 mg/dL — ABNORMAL HIGH (ref 70–99)
Potassium: 3.5 mmol/L (ref 3.5–5.1)
Sodium: 136 mmol/L (ref 135–145)

## 2020-10-06 LAB — HISTOPLASMA ANTIGEN, URINE: Histoplasma Antigen, urine: <0.5 (ref <0.5)

## 2020-10-06 LAB — PHOSPHORUS: Phosphorus: 3.7 mg/dL (ref 2.5–4.6)

## 2020-10-06 LAB — PATHOLOGIST SMEAR REVIEW

## 2020-10-06 LAB — CRYPTOCOCCUS ANTIGEN, SERUM: Cryptococcus Antigen, Serum: NEGATIVE

## 2020-10-06 LAB — HIV-1 RNA QUANT-NO REFLEX-BLD
HIV 1 RNA Quant: 70 copies/mL
LOG10 HIV-1 RNA: 1.845 log10copy/mL

## 2020-10-06 MED ORDER — SODIUM CHLORIDE 0.9 % IV SOLN
INTRAVENOUS | Status: DC | PRN
  Administered 2020-10-06: 250 mL via INTRAVENOUS

## 2020-10-06 NOTE — Progress Notes (Signed)
Date of Admission:  10/04/2020     ID: Justin Wallace is a 75 y.o. male  Principal Problem:   Pancytopenia ([place]) Active Problems:   Weight loss   AIDS (acquired immune deficiency syndrome) ([place])   Need for pneumocystis prophylaxis   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HCC)   Chemotherapy-induced neutropenia (HCC)   Debility   Protein malnutrition (HCC)   Neutropenia with fever (HCC)    Subjective: Patient feeling much better No fever Appetite better No nausea or vomiting No cough No pain abdomen no diarrhea   Medications:  . acyclovir  400 mg Oral BID  . bictegravir-emtricitabine-tenofovir AF  1 tablet Oral Daily  . chlorhexidine  15 mL Mouth/Throat BID  . Chlorhexidine Gluconate Cloth  6 each Topical Daily  . feeding supplement  237 mL Oral TID BM  . gabapentin  100 mg Oral TID  . multivitamin with minerals  1 tablet Oral Daily  . sulfamethoxazole-trimethoprim  1 tablet Oral Daily    Objective: Vital signs in last 24 hours:   Patient Vitals for the past 24 hrs:  BP Temp Temp src Pulse Resp SpO2 Weight  10/06/20 1438 (!) 94/59 -- -- 71 -- 96 % --  10/06/20 0900 (!) 94/50 98.7 F (37.1 C) -- -- 18 98 % --  10/06/20 0516 (!) 96/56 98.3 F (36.8 C) Oral 70 16 93 % 75.3 kg  10/05/20 2306 (!) 98/52 99 F (37.2 C) Oral 73 20 97 % --  10/05/20 2220 (!) 95/52 99 F (37.2 C) Oral 75 -- 96 % --   PHYSICAL EXAM:  General: Alert, cooperative, no distress, appears stated age.  Head: Normocephalic, without obvious abnormality, atraumatic. Eyes: Conjunctivae clear, anicteric sclerae. Pupils are equal ENT Nares normal. No drainage or sinus tenderness. Lips, mucosa, and tongue normal. No Thrush Neck: Supple, symmetrical, no adenopathy, thyroid: non tender no carotid bruit and no JVD. Back: No CVA tenderness. Lungs: Clear to auscultation bilaterally. No Wheezing or Rhonchi. No rales. Heart: Regular rate and rhythm, no murmur, rub or gallop. Abdomen: Soft, non-tender,not distended.  Bowel sounds normal. No masses Extremities: atraumatic, no cyanosis. No edema. No clubbing Skin: No rashes or lesions. Or bruising Lymph: Cervical, supraclavicular normal. Neurologic: Grossly non-focal  Lab Results Recent Labs    10/04/20 1301 10/04/20 2205 10/05/20 0536 10/05/20 0938 10/06/20 0410 10/06/20 0903  WBC 0.2*   < > 0.2*   < > 0.6* 0.8*  HGB 5.5*   < > 5.6*   < > 7.2* 6.9*  HCT 16.8*   < > 16.4*   < > 21.1* 20.6*  NA 133*  --   --   --  136  --   K 3.9  --   --   --  3.5  --   CL 104  --   --   --  108  --   CO2 21*  --   --   --  21*  --   BUN 34*  --   --   --  25*  --   CREATININE 0.94  --  0.89  --  0.87  --    < > = values in this interval not displayed.   Liver Panel Recent Labs    10/04/20 1301 10/06/20 0410  PROT 6.8 5.7*  ALBUMIN 3.4* 2.8*  AST 26 17  ALT 24 23  ALKPHOS 67 52  BILITOT 0.7 1.1  BILIDIR  --  0.2  IBILI  --  0.9  Microbiology: 10/04/2020 blood culture no growth Studies/Results: No results found.   Assessment/Plan: Febrile neutropenia.  Fever is resolved.  Currently on vancomycin and cefepime.  Blood culture negative.  Neutropenia slowly recovering. We will discontinue vancomycin Tomorrow can discontinue cefepime if ANC above 0.5 and change to p.o. ciprofloxacin for possible discharge. Continue acyclovir prophylaxis continue Bactrim prophylaxis can reduce Bactrim to single strength every day   B-cell lymphoma on chemotherapy Receives intrathecal methotrexate for empiric treatment Also on cyclophosphamide, vincristine, doxorubicin and rituximab.  He gets it every 3 weeks.  Last dose was in February  Severe pancytopenia improving  Advanced AIDS diagnosed in September 2021.  Currently on Biktarvy and well controlled  Severe anemia status post blood transfusion  Severe thrombocytopenia improving  Discussed the management with the patient in detail.

## 2020-10-06 NOTE — Evaluation (Signed)
Physical Therapy Evaluation Patient Details Name: Justin Wallace MRN: [id number] DOB: July 26, 1946 Today's Date: 10/06/2020   History of Present Illness  Pt  is a 75 y.o. male with medical history significant for AIDS, B-cell lymphoma, BPH, presented to the emergency department at the urging of his oncology clinic for chief concerns of abnormal labs and fever.    Clinical Impression  Pt alert, in chair, oriented x4. Denied pain, and reported feeling better than when he was admitted. Pt demonstrated transfers and ambulation independently, WFLs. The patient demonstrated and reported return to baseline level of functioning, no further acute PT needs indicated. PT to sign off. Please reconsult PT if pt status changes or acute needs are identified. Of note, PT and pt did discuss potential benefit of physical therapy on an outpatient basis to address weakness from chemotherapy and pts goals to return to higher level activities (such as pickleball). Pt aware to contact oncologist/PCP if he would like to proceed with a referral.      Follow Up Recommendations No PT follow up    Equipment Recommendations  None recommended by PT    Recommendations for Other Services       Precautions / Restrictions Precautions Precautions: Fall Restrictions Weight Bearing Restrictions: No      Mobility  Bed Mobility               General bed mobility comments: pt in chair at start/end of session    Transfers Overall transfer level: Independent Equipment used: None                Ambulation/Gait Ambulation/Gait assistance: Independent Gait Distance (Feet): 160 Feet Assistive device: None Gait Pattern/deviations: WFL(Within Functional Limits)        Stairs            Wheelchair Mobility    Modified Rankin (Stroke Patients Only)       Balance Overall balance assessment: Independent                                           Pertinent Vitals/Pain Pain  Assessment: No/denies pain    Home Living Family/patient expects to be discharged to:: Private residence Living Arrangements: Spouse/significant other Available Help at Discharge: Family Type of Home: House Home Access: Stairs to enter Entrance Stairs-Rails: None Entrance Stairs-Number of Steps: 2 Home Layout: Two level;Able to live on main level with bedroom/bathroom Home Equipment: Grab bars - tub/shower;Walker - 2 wheels;Cane - single point      Prior Function Level of Independence: Independent               Hand Dominance        Extremity/Trunk Assessment   Upper Extremity Assessment Upper Extremity Assessment: Overall WFL for tasks assessed    Lower Extremity Assessment Lower Extremity Assessment: Overall WFL for tasks assessed (official MMT deferred)    Cervical / Trunk Assessment Cervical / Trunk Assessment: Normal  Communication   Communication: No difficulties  Cognition Arousal/Alertness: Awake/alert Behavior During Therapy: WFL for tasks assessed/performed Overall Cognitive Status: Within Functional Limits for tasks assessed                                        General Comments      Exercises     Assessment/Plan  PT Assessment Patent does not need any further PT services  PT Problem List         PT Treatment Interventions      PT Goals (Current goals can be found in the Care Plan section)       Frequency     Barriers to discharge        Co-evaluation               AM-PAC PT "6 Clicks" Mobility  Outcome Measure Help needed turning from your back to your side while in a flat bed without using bedrails?: None Help needed moving from lying on your back to sitting on the side of a flat bed without using bedrails?: None Help needed moving to and from a bed to a chair (including a wheelchair)?: None Help needed standing up from a chair using your arms (e.g., wheelchair or bedside chair)?: None Help needed to  walk in hospital room?: None Help needed climbing 3-5 steps with a railing? : None 6 Click Score: 24    End of Session   Activity Tolerance: Patient tolerated treatment well Patient left: in chair;with call bell/phone within reach Nurse Communication: Mobility status PT Visit Diagnosis: Other abnormalities of gait and mobility (R26.89)    Time: 1046-1100 PT Time Calculation (min) (ACUTE ONLY): 14 min   Charges:   PT Evaluation $PT Eval Low Complexity: 1 Low          Lieutenant Diego PT, DPT 12:13 PM,10/06/20

## 2020-10-06 NOTE — Progress Notes (Signed)
[facility] Progress Note  Patient: Justin Wallace    PJK:[id number]  DOA: 10/04/2020     Date of Service: the patient was seen and examined on 10/06/2020  Chief Complaint  Patient presents with  . Fever   Brief hospital course: Justin Wallace is a 75 y.o. male with medical history significant for AIDS, B-cell lymphoma, BPH, presented to the emergency department at the urging of his oncology clinic for chief concerns of abnormal labs and fever.  Justin Wallace was at oncology clinic and was found to have a temperature of 101.2.  And platelet was low at 8, WBC of 0.2, ANC of 100.  Patient was sent to the emergency department for further evaluation.  Vaccination: Patient is fully vaccinated for COVID-19 ED Course: Discussed with ED provider, patient requiring hospitalization due to pancytopenia and severe thrombocytopenia.  Vitals in the ED was afebrile initially with 99 Fahrenheit, respiration 22, heart rate 89, blood pressure 108/60, satting at 98% on room air.  Lactic acid ordered in the ED was initially 1.8 it improved to 0.9.  Chest x-ray ordered by EDP was negative.   Assessment and Plan: Principal Problem:   Pancytopenia ([place]) Active Problems:   Weight loss   AIDS (acquired immune deficiency syndrome) (HCC)   Need for pneumocystis prophylaxis   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HCC)   Chemotherapy-induced neutropenia (HCC)   Debility   Protein malnutrition (HCC)   # Pancytopenia Severe thrombocytopenia -patient is neurologically intact following and follows commands, at this time no CT of the head without contrast is indicated -If patient's mental status changes, will order CT of the head -3/8 s/p 1 unit of platelets and PRBC given in the ED -HB 5.5--5.9---7.2 --3/9 Transfuse 2 unit of PRBC and 1 unit of platelets --Platelet count 11--28 K Follow oncology for further recommendation of platelet transfusion -Oncology will follow as a consult, Dr. Tasia Catchings Monitor CBC every 6 hourly,  transfuse as needed   # Neutropenic fever WBC 0.1--0.2--0.6 ANC 0.1--0.1--0.4 -Continue vancomycin, and cefepime, d/c'd  metronidazole as per ID  -Blood cultures x2 neg x 24 hrs -UA Neg  -Infectious disease, Dr. Delaine Lame is following, infectious work-up pending   # Protein malnutrition-dietary has been consulted   # Advanced AIDS diagnosed September 2021.  Started on Biktarvy October 2021.  Viral load decreased from 600000 s to 130.  CD4 has increased from 64>.224  His HIV is under good control with Biktarvy.   On PCP prophylaxis, Bactrim and Also on acyclovir for herpes prophylaxis.   As needed meds: Ondansetron and acetaminophen  Body mass index is 23.47 kg/m.        Diet: Regular DVT Prophylaxis: SCD, pharmacological prophylaxis contraindicated due to Pancytopenia risk of bleeding   Advance goals of care discussion: Full code  Family Communication: family was NOT present at bedside, at the time of interview.  The pt provided permission to discuss medical plan with the family. Opportunity was given to ask question and all questions were answered satisfactorily.   Disposition:  Pt is from home, admitted with pancytopenia, still has low platelet count, neutropenic and low hemoglobin, which precludes a safe discharge. Discharge to home, when pancytopenia improves and cleared by oncologist.  Subjective: No overnight issues, patient denied any active bleeding, no headache or dizziness.  Denied any chest pain or shortness of breath.  No abdominal pain, no nausea vomiting or diarrhea.   Physical Exam: General:  alert oriented to time, place, and person.  Appear in no distress, affect  appropriate Eyes: PERRLA ENT: Oral Mucosa Clear, moist  Neck: no JVD,  Cardiovascular: S1 and S2 Present, no Murmur,  Respiratory: good respiratory effort, Bilateral Air entry equal and Decreased, no Crackles, no wheezes Abdomen: Bowel Sound present, Soft and no tenderness,  Skin: no  rashes Extremities: no Pedal edema, no calf tenderness Neurologic: without any new focal findings Gait not checked due to patient safety concerns  Vitals:   10/05/20 2220 10/05/20 2306 10/06/20 0516 10/06/20 0900  BP: (!) 95/52 (!) 98/52 (!) 96/56 (!) 94/50  Pulse: 75 73 70   Resp:  20 16 18   Temp: 99 F (37.2 C) 99 F (37.2 C) 98.3 F (36.8 C) 98.7 F (37.1 C)  TempSrc: Oral Oral Oral   SpO2: 96% 97% 93% 98%  Weight:   75.3 kg   Height:        Intake/Output Summary (Last 24 hours) at 10/06/2020 1332 Last data filed at 10/06/2020 0950 Gross per 24 hour  Intake 1710.58 ml  Output --  Net 1710.58 ml   Filed Weights   10/04/20 2050 10/05/20 0336 10/06/20 0516  Weight: 72.1 kg 72.1 kg 75.3 kg    Data Reviewed: I have personally reviewed and interpreted daily labs, tele strips, imagings as discussed above. I reviewed all nursing notes, pharmacy notes, vitals, pertinent old records I have discussed plan of care as described above with RN and patient/family.  CBC: Recent Labs  Lab 10/04/20 1111 10/04/20 1301 10/04/20 2205 10/05/20 0536 10/05/20 6568 10/05/20 2312 10/06/20 0410 10/06/20 0903  WBC 0.2* 0.2* 0.1* 0.2* 0.2* 0.5* 0.6* 0.8*  NEUTROABS 0.1* 0.1* 0.1* 0.1*  --   --  0.4*  --   HGB 6.1* 5.5* 5.5* 5.6* 5.9* 7.1* 7.2* 6.9*  HCT 18.3* 16.8* 16.6* 16.4* 17.6* 20.3* 21.1* 20.6*  MCV 97.3 98.8 95.4 95 95.7 89.4 90.2 90.4  PLT 8* 7* 13* 14* 11* 25* 28* 26*   Basic Metabolic Panel: Recent Labs  Lab 10/04/20 1111 10/04/20 1301 10/05/20 0536 10/06/20 0410  NA 136 133*  --  136  K 3.9 3.9  --  3.5  CL 104 104  --  108  CO2 22 21*  --  21*  GLUCOSE 141* 131*  --  100*  BUN 30* 34*  --  25*  CREATININE 1.01 0.94 0.89 0.87  CALCIUM 8.8* 8.7*  --  8.3*  MG  --   --   --  1.8  PHOS  --   --   --  3.7    Studies: No results found.  Scheduled Meds: . acyclovir  400 mg Oral BID  . bictegravir-emtricitabine-tenofovir AF  1 tablet Oral Daily  . chlorhexidine   15 mL Mouth/Throat BID  . Chlorhexidine Gluconate Cloth  6 each Topical Daily  . feeding supplement  237 mL Oral TID BM  . gabapentin  100 mg Oral TID  . multivitamin with minerals  1 tablet Oral Daily  . sulfamethoxazole-trimethoprim  1 tablet Oral Daily   Continuous Infusions: . ceFEPime (MAXIPIME) IV 2 g (10/06/20 0533)  . vancomycin 750 mg (10/06/20 0400)   PRN Meds: acetaminophen **OR** acetaminophen, magic mouthwash w/lidocaine, ondansetron **OR** ondansetron (ZOFRAN) IV, oxyCODONE  Time spent: 35 minutes  Author: Val Riles. MD [facility] 10/06/2020 1:32 PM  To reach On-call, see care teams to locate the attending and reach out to them via [email or web address]. If 7PM-7AM, please contact night-coverage If you still have difficulty reaching the attending provider, please page the [facility] (Director  on Call) for [facility] on amion for assistance.

## 2020-10-07 DIAGNOSIS — D61818 Other pancytopenia: Secondary | ICD-10-CM | POA: Diagnosis not present

## 2020-10-07 LAB — HEPATIC FUNCTION PANEL
ALT: 18 U/L (ref 0–44)
AST: 15 U/L (ref 15–41)
Albumin: 2.7 g/dL — ABNORMAL LOW (ref 3.5–5.0)
Alkaline Phosphatase: 55 U/L (ref 38–126)
Bilirubin, Direct: <0.1 mg/dL (ref 0.0–0.2)
Total Bilirubin: 0.5 mg/dL (ref 0.3–1.2)
Total Protein: 5.2 g/dL — ABNORMAL LOW (ref 6.5–8.1)

## 2020-10-07 LAB — CBC WITH DIFFERENTIAL/PLATELET
Abs Immature Granulocytes: 0.24 10*3/uL — ABNORMAL HIGH (ref 0.00–0.07)
Basophils Absolute: 0 10*3/uL (ref 0.0–0.1)
Basophils Relative: 1 %
Eosinophils Absolute: 0 10*3/uL (ref 0.0–0.5)
Eosinophils Relative: 2 %
HCT: 19 % — ABNORMAL LOW (ref 39.0–52.0)
Hemoglobin: 6.3 g/dL — ABNORMAL LOW (ref 13.0–17.0)
Immature Granulocytes: 14 %
Lymphocytes Relative: 3 %
Lymphs Abs: 0.1 10*3/uL — ABNORMAL LOW (ref 0.7–4.0)
MCH: 31 pg (ref 26.0–34.0)
MCHC: 33.2 g/dL (ref 30.0–36.0)
MCV: 93.6 fL (ref 80.0–100.0)
Monocytes Absolute: 0.3 10*3/uL (ref 0.1–1.0)
Monocytes Relative: 15 %
Neutro Abs: 1.1 10*3/uL — ABNORMAL LOW (ref 1.7–7.7)
Neutrophils Relative %: 65 %
Platelets: 26 10*3/uL — CL (ref 150–400)
RBC: 2.03 MIL/uL — ABNORMAL LOW (ref 4.22–5.81)
RDW: 18.6 % — ABNORMAL HIGH (ref 11.5–15.5)
Smear Review: NORMAL
WBC: 1.7 10*3/uL — ABNORMAL LOW (ref 4.0–10.5)
nRBC: 1.7 % — ABNORMAL HIGH (ref 0.0–0.2)

## 2020-10-07 LAB — BPAM PLATELET PHERESIS
Blood Product Expiration Date: 202203122359
ISSUE DATE / TIME: 202203091915
Unit Type and Rh: 5100

## 2020-10-07 LAB — HEMOGLOBIN AND HEMATOCRIT, BLOOD
HCT: 25.1 % — ABNORMAL LOW (ref 39.0–52.0)
Hemoglobin: 8.7 g/dL — ABNORMAL LOW (ref 13.0–17.0)

## 2020-10-07 LAB — BASIC METABOLIC PANEL
Anion gap: 7 (ref 5–15)
BUN: 17 mg/dL (ref 8–23)
CO2: 21 mmol/L — ABNORMAL LOW (ref 22–32)
Calcium: 8.3 mg/dL — ABNORMAL LOW (ref 8.9–10.3)
Chloride: 110 mmol/L (ref 98–111)
Creatinine, Ser: 0.8 mg/dL (ref 0.61–1.24)
GFR, Estimated: >60 mL/min (ref >60)
Glucose, Bld: 95 mg/dL (ref 70–99)
Potassium: 3.1 mmol/L — ABNORMAL LOW (ref 3.5–5.1)
Sodium: 138 mmol/L (ref 135–145)

## 2020-10-07 LAB — MAGNESIUM: Magnesium: 2.1 mg/dL (ref 1.7–2.4)

## 2020-10-07 LAB — PREPARE RBC (CROSSMATCH)

## 2020-10-07 LAB — HISTOPLASMA GAL'MANNAN AG SER: Histoplasma Gal'mannan Ag Ser: <0.5 (ref <0.5)

## 2020-10-07 LAB — PREPARE PLATELET PHERESIS: Unit division: 0

## 2020-10-07 LAB — PHOSPHORUS: Phosphorus: 3.4 mg/dL (ref 2.5–4.6)

## 2020-10-07 LAB — CMV DNA, QUANTITATIVE, PCR
CMV DNA Quant: 5980 IU/mL
Log10 CMV Qn DNA Pl: 3.777 log10 IU/mL

## 2020-10-07 LAB — HUMAN PARVOVIRUS DNA DETECTION BY PCR: Parvovirus B19, PCR: NEGATIVE

## 2020-10-07 MED ORDER — SODIUM CHLORIDE 0.9% IV SOLUTION: Freq: Once | INTRAVENOUS | Status: AC

## 2020-10-07 MED ORDER — POTASSIUM CHLORIDE CRYS ER 20 MEQ PO TBCR
40.0000 meq | EXTENDED_RELEASE_TABLET | Freq: Four times a day (QID) | ORAL | Status: AC
Start: 2020-10-07 — End: 2020-10-07
  Administered 2020-10-07 (×3): 40 meq via ORAL
  Filled 2020-10-07 (×3): qty 2

## 2020-10-07 NOTE — Plan of Care (Signed)
?  Problem: Clinical Measurements: ?Goal: Ability to maintain clinical measurements within normal limits will improve ?Outcome: Progressing ?Goal: Will remain free from infection ?Outcome: Progressing ?Goal: Diagnostic test results will improve ?Outcome: Progressing ?  ?

## 2020-10-07 NOTE — Progress Notes (Signed)
   Date of Admission:  10/04/2020     ID: Justin Wallace is a 75 y.o. male  Principal Problem:   Pancytopenia ([place]) Active Problems:   Weight loss   AIDS (acquired immune deficiency syndrome) ([place])   Need for pneumocystis prophylaxis   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HCC)   Chemotherapy-induced neutropenia (HCC)   Debility   Protein malnutrition (HCC)   Neutropenia with fever (HCC)    Subjective: Feeling much better No fever in 48 hrs No pain abdomen 'no cough No sob  Medications:  . acyclovir  400 mg Oral BID  . bictegravir-emtricitabine-tenofovir AF  1 tablet Oral Daily  . chlorhexidine  15 mL Mouth/Throat BID  . Chlorhexidine Gluconate Cloth  6 each Topical Daily  . feeding supplement  237 mL Oral TID BM  . gabapentin  100 mg Oral TID  . multivitamin with minerals  1 tablet Oral Daily  . potassium chloride  40 mEq Oral Q6H  . sulfamethoxazole-trimethoprim  1 tablet Oral Daily    Objective: Vital signs in last 24 hours: Temp:  [97.5 F (36.4 C)-98.1 F (36.7 C)] 97.8 F (36.6 C) (03/11 1205) Pulse Rate:  [60-75] 66 (03/11 1205) Resp:  [16-18] 16 (03/11 1205) BP: (82-96)/(53-59) 82/56 (03/11 1205) SpO2:  [96 %-100 %] 100 % (03/11 1205) Weight:  [72.6 kg] 72.6 kg (03/11 0457)  PHYSICAL EXAM:  General: Alert, cooperative, no distress,  Head: Normocephalic, without obvious abnormality, atraumatic. Eyes: Conjunctivae clear, anicteric sclerae. Pupils are equal ENT Nares normal. No drainage or sinus tenderness. Lips, mucosa, and tongue normal. No Thrush Neck: Supple, symmetrical, no adenopathy, thyroid: non tender no carotid bruit and no JVD. PORT  Back: No CVA tenderness. Lungs: Clear to auscultation bilaterally. No Wheezing or Rhonchi. No rales. Heart: Regular rate and rhythm, no murmur, rub or gallop. Abdomen: Soft,  distended. Bowel sounds normal. No masses Extremities: atraumatic, no cyanosis. No edema. No clubbing Skin: No rashes or lesions. Or bruising Lymph:  Cervical, supraclavicular normal. Neurologic: Grossly non-focal  Lab Results Recent Labs    10/06/20 0410 10/06/20 0903 10/07/20 0630  WBC 0.6* 0.8* 1.7*  HGB 7.2* 6.9* 6.3*  HCT 21.1* 20.6* 19.0*  NA 136  --  138  K 3.5  --  3.1*  CL 108  --  110  CO2 21*  --  21*  BUN 25*  --  17  CREATININE 0.87  --  0.80   Liver Panel Recent Labs    10/06/20 0410 10/07/20 0630  PROT 5.7* 5.2*  ALBUMIN 2.8* 2.7*  AST 17 15  ALT 23 18  ALKPHOS 52 55  BILITOT 1.1 0.5  BILIDIR 0.2 <0.1  IBILI 0.9 NOT CALCULATED   ID work-up HIV RNA 70 CD4 test cannot be done because of low WBC Cryptococcal antigen negative CMV DNA Epstein-Barr virus DNA Human parvovirus DNA Toxoplasma antibody MAC blood culture have all been sent  Assessment/Plan: Febrile neutropenia has resolved.  Secondary to chemotherapy.  No UTI no pneumonia.  Blood culture negative so far HE has been on empiric IV vancomycin and cefepime which can be discontinued as ANC  1100  AIDS.  On Biktarvy.  Tests for opportunistic infection  sent and it is pending.  Immune reconstitution related infections is theoretically feasible but less likely HOLD bactrim because of pancytopenia  B-cell lymphoma on chemotherapy   Severe anemia getting  blood transfusion  Severe thrombocytopenia  Discussed the management with patient and wife ID will sign off- call if needed

## 2020-10-07 NOTE — Progress Notes (Signed)
Hematology/Oncology Consult note [facility]  [phone] [phone]  Patient Care Team: Olin Hauser, DO as PCP - General (Family Medicine) Earlie Server, MD as Consulting Physician (Hematology and Oncology)   Name of the patient: Justin Wallace  [id number]  April 28, 1946   Date of visit: 10/07/2020   Interval history- no fever in the last 24 hours. He feels fatigued but denies other complaints  ECOG PS- 2 Pain scale- 0  Review of systems- Review of Systems  Constitutional: Positive for malaise/fatigue. Negative for chills, fever and weight loss.  HENT: Negative for congestion, ear discharge and nosebleeds.   Eyes: Negative for blurred vision.  Respiratory: Negative for cough, hemoptysis, sputum production, shortness of breath and wheezing.   Cardiovascular: Negative for chest pain, palpitations, orthopnea and claudication.  Gastrointestinal: Negative for abdominal pain, blood in stool, constipation, diarrhea, heartburn, melena, nausea and vomiting.  Genitourinary: Negative for dysuria, flank pain, frequency, hematuria and urgency.  Musculoskeletal: Negative for back pain, joint pain and myalgias.  Skin: Negative for rash.  Neurological: Negative for dizziness, tingling, focal weakness, seizures, weakness and headaches.  Endo/Heme/Allergies: Does not bruise/bleed easily.  Psychiatric/Behavioral: Negative for depression and suicidal ideas. The patient does not have insomnia.      No Known Allergies   Past Medical History:  Diagnosis Date  . Celiac disease   . 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[place]) 05/27/2020  . HIV (human immunodeficiency virus infection) ([place])   . Lyme disease   . Lymphoma [facility])      Past Surgical History:  Procedure Laterality Date  . PORTA CATH INSERTION N/A 04/25/2020   Procedure: PORTA CATH INSERTION;  Surgeon: Algernon Huxley, MD;  Location: [facility];  Service: Cardiovascular;  Laterality: N/A;     Social History   Socioeconomic History  . Marital status: Married    Spouse name: Opal Sidles   . Number of children: 2  . Years of education: Not on file  . Highest education level: Not on file  Occupational History  . Not on file  Tobacco Use  . Smoking status: Never Smoker  . Smokeless tobacco: Never Used  Vaping Use  . Vaping Use: Never used  Substance and Sexual Activity  . Alcohol use: Yes    Comment: occasional  . Drug use: Never  . Sexual activity: Never  Other Topics Concern  . Not on file  Social History Narrative  . Not on file   Social Determinants of Health   Financial Resource Strain: Not on file  Food Insecurity: Not on file  Transportation Needs: Not on file  Physical Activity: Not on file  Stress: Not on file  Social Connections: Not on file  Intimate Partner Violence: Not on file    Family History  Problem Relation Age of Onset  . Hyperlipidemia Mother   . Brain cancer Father      Current Facility-Administered Medications:  .  0.9 %  sodium chloride infusion, , Intravenous, PRN, Val Riles, MD, Stopped at 10/07/20 0030 .  acetaminophen (TYLENOL) tablet 650 mg, 650 mg, Oral, Q6H PRN, 650 mg at 10/04/20 1653 **OR** acetaminophen (TYLENOL) suppository 650 mg, 650 mg, Rectal, Q6H PRN, Cox, Amy N, DO .  acyclovir (ZOVIRAX) 200 MG capsule 400 mg, 400 mg, Oral, BID, Virl Cagey E, RPH, 400 mg at 10/07/20 0930 .  bictegravir-emtricitabine-tenofovir AF (BIKTARVY) 50-200-25 MG per tablet 1 tablet, 1 tablet, Oral, Daily, Cox, Amy N, DO, 1 tablet at 10/07/20 0930 .  chlorhexidine (PERIDEX) 0.12 % solution 15 mL, 15 mL, Mouth/Throat, BID, Earlie Server, MD, 15 mL at 10/07/20 0929 .  Chlorhexidine Gluconate Cloth 2 % PADS 6 each, 6 each, Topical, Daily, Cox, Amy N, DO, 6 each at 10/06/20 0859 .  feeding supplement (ENSURE ENLIVE / ENSURE PLUS) liquid 237 mL, 237 mL, Oral, TID BM, Val Riles, MD, 237 mL at 10/06/20 0859 .  gabapentin (NEURONTIN) capsule 100  mg, 100 mg, Oral, TID, Cox, Amy N, DO, 100 mg at 10/07/20 0929 .  magic mouthwash w/lidocaine, 5 mL, Oral, QID PRN, Cox, Amy N, DO .  multivitamin with minerals tablet 1 tablet, 1 tablet, Oral, Daily, Val Riles, MD, 1 tablet at 10/06/20 2217 .  ondansetron (ZOFRAN) tablet 4 mg, 4 mg, Oral, Q6H PRN **OR** ondansetron (ZOFRAN) injection 4 mg, 4 mg, Intravenous, Q6H PRN, Cox, Amy N, DO .  oxyCODONE (Oxy IR/ROXICODONE) immediate release tablet 5 mg, 5 mg, Oral, Q4H PRN, Cox, Amy N, DO .  potassium chloride SA (KLOR-CON) CR tablet 40 mEq, 40 mEq, Oral, Q6H, Val Riles, MD, 40 mEq at 10/07/20 0929 .  sulfamethoxazole-trimethoprim (BACTRIM DS) 800-160 MG per tablet 1 tablet, 1 tablet, Oral, Daily, Cox, Amy N, DO, 1 tablet at 10/07/20 0930  Physical exam:  Vitals:   10/06/20 2139 10/07/20 0457 10/07/20 0800 10/07/20 1205  BP: (!) 96/54 (!) 92/56 (!) 89/53 (!) 82/56  Pulse: 75 62 60 66  Resp: 18 18 18 16   Temp: (!) 97.5 F (36.4 C) 98.1 F (36.7 C) (!) 97.5 F (36.4 C) 97.8 F (36.6 C)  TempSrc: Oral  Oral Oral  SpO2: 99% 100%  100%  Weight:  160 lb 1.6 oz (72.6 kg)    Height:       Physical Exam Constitutional:      General: He is not in acute distress. Cardiovascular:     Rate and Rhythm: Normal rate and regular rhythm.     Heart sounds: Normal heart sounds.  Pulmonary:     Effort: Pulmonary effort is normal.     Breath sounds: Normal breath sounds.  Abdominal:     General: Bowel sounds are normal.     Palpations: Abdomen is soft.  Skin:    General: Skin is warm and dry.  Neurological:     Mental Status: He is alert and oriented to person, place, and time.      CMP Latest Ref Rng & Units 10/07/2020  Glucose 70 - 99 mg/dL 95  BUN 8 - 23 mg/dL 17  Creatinine 0.61 - 1.24 mg/dL 0.80  Sodium 135 - 145 mmol/L 138  Potassium 3.5 - 5.1 mmol/L 3.1(L)  Chloride 98 - 111 mmol/L 110  CO2 22 - 32 mmol/L 21(L)  Calcium 8.9 - 10.3 mg/dL 8.3(L)  Total Protein 6.5 - 8.1 g/dL 5.2(L)   Total Bilirubin 0.3 - 1.2 mg/dL 0.5  Alkaline Phos 38 - 126 U/L 55  AST 15 - 41 U/L 15  ALT 0 - 44 U/L 18   CBC Latest Ref Rng & Units 10/07/2020  WBC 4.0 - 10.5 K/uL 1.7(L)  Hemoglobin 13.0 - 17.0 g/dL 6.3(L)  Hematocrit 39.0 - 52.0 % 19.0(L)  Platelets 150 - 400 K/uL 26(LL)    @IMAGES @  DG Chest 2 View  Result Date: 10/04/2020 CLINICAL DATA:  Sepsis EXAM: CHEST - 2 VIEW COMPARISON:  03/30/2020 FINDINGS: Heart size is normal. Mediastinal shadows are normal. The lungs are clear. Power port on the right with the tip in the SVC.  No significant bone finding. IMPRESSION: No active disease. Power port on the right. Electronically Signed   By: Nelson Chimes M.D.   On: 10/04/2020 13:54   DG FLUORO GUIDED LOC OF NEEDLE/CATH TIP FOR SPINAL INJECT LT  Result Date: 09/28/2020 CLINICAL DATA:  Intrathecal methotrexate for high-grade B-cell lymphoma EXAM: DIAGNOSTIC LUMBAR PUNCTURE UNDER FLUOROSCOPIC GUIDANCE COMPARISON:  09/07/2020 FLUOROSCOPY TIME:  Fluoroscopy Time:  0.1 minute Radiation Exposure Index (if provided by the fluoroscopic device): 0.2 mGy Number of Acquired Spot Images: 0 PROCEDURE: Informed consent was obtained from the patient prior to the procedure, including potential complications of headache, allergy, and pain. With the patient prone, the lower back was prepped with Betadine. 1% Lidocaine was used for local anesthesia. Lumbar puncture was performed at the L2-3 level using a 22 gauge needle with return of clear CSF. 12 mg of methotrexate was injected into the subarachnoid space. The patient tolerated the procedure well and there were no apparent complications. No immediate complications. IMPRESSION: Successful fluoroscopic guided lumbar puncture for intrathecal chemotherapy. Electronically Signed   By: Kathreen Devoid   On: 09/28/2020 13:12     Assessment and plan- Patient is a 75 y.o. male with high grade B cell lymphoma s/p 6 cycles of DA EPOCH chemotherapy admitted for neutropenia  fever  Neutropenic fever- neutropenia improving. He did receive neulasta after chemotherapy. He has been afebrile in the last 24 hours. Ok to d/c patient on oral antibiotics such as levofloxacin or as recommended by ID  Pancytopenia- likely to take another week- 10 days to improve. Platelets better than what they were 2 days ago. Now in 20's. No bleeding. Fall precautions upon discharge. No need for platelet transfusion as long as platelet count is >20  Anemia- secondary to chemotherapy. He received 1 unit of prbc today. If he remains inpatient over the weekend, it would be reasonable to transfuse him another unit and we will follow closely upon discharge. Will add anemia labs to am labs  We will arrange close f/u with Dr. Tasia Catchings next week     Visit Diagnosis 1. Neutropenia with fever (HCC)   2. Pancytopenia ([place])   3. Neutropenic fever ([place])      Dr. Randa Evens, MD, MPH [facility] [phone] 10/07/2020 9:30 PM

## 2020-10-07 NOTE — Progress Notes (Signed)
[facility] Progress Note  Patient: Justin Wallace    JSE:[id number]  DOA: 10/04/2020     Date of Service: the patient was seen and examined on 10/07/2020  Chief Complaint  Patient presents with  . Fever   Brief hospital course: [name] is a 75 y.o. male with medical history significant for AIDS, B-cell lymphoma, BPH, presented to the emergency department at the urging of his oncology clinic for chief concerns of abnormal labs and fever.  [name] was at oncology clinic and was found to have a temperature of 101.2.  And platelet was low at 8, WBC of 0.2, ANC of 100.  Patient was sent to the emergency department for further evaluation.  Vaccination: Patient is fully vaccinated for COVID-19 ED Course: Discussed with ED provider, patient requiring hospitalization due to pancytopenia and severe thrombocytopenia.  Vitals in the ED was afebrile initially with 99 Fahrenheit, respiration 22, heart rate 89, blood pressure 108/60, satting at 98% on room air.  Lactic acid ordered in the ED was initially 1.8 it improved to 0.9.  Chest x-ray ordered by EDP was negative.   Assessment and Plan: Principal Problem:   Pancytopenia ([place]) Active Problems:   Weight loss   AIDS (acquired immune deficiency syndrome) (HCC)   Need for pneumocystis prophylaxis   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HCC)   Chemotherapy-induced neutropenia (HCC)   Debility   Protein malnutrition (HCC)   # Pancytopenia Severe thrombocytopenia -patient is neurologically intact following and follows commands, at this time no CT of the head without contrast is indicated -If patient's mental status changes, will order CT of the head -3/8 s/p 1 unit of platelets and PRBC given in the ED -HB 5.5--5.9---7.2--6.3 --Platelet count 11--28--26 K --3/9 Transfuse 2 unit of PRBC and 1 unit of platelets --3/11 transfuse 1 unit of PRBC Follow oncology for further recommendation of platelet transfusion -Oncology will follow as a consult,  Dr. Tasia Catchings Monitor CBC daily, transfuse as needed   # Neutropenic fever WBC 0.1--0.2--0.6--1.7 ANC 0.1--0.1--0.4--1.1 -s/p vancomycin, cefepime, metronidazole, d/c'd as per ID  -Blood cultures x2 neg x 24 hrs -UA Neg  -Infectious disease, Dr. Delaine Lame was following,rec no more abx and d/c  # Hypokalemia, potassium repleted.  Monitor replete as needed. 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  Started on Biktarvy October 2021.  Viral load decreased from 600000 s to 130.  CD4 has increased from 64>.224  His HIV is under good control with Biktarvy.   On PCP prophylaxis, Bactrim and Also on acyclovir for herpes prophylaxis.   As needed meds: Ondansetron and acetaminophen  Body mass index is 23.47 kg/m.        Diet: Regular DVT Prophylaxis: SCD, pharmacological prophylaxis contraindicated due to Pancytopenia risk of bleeding   Advance goals of care discussion: Full code  Family Communication: family was NOT present at bedside, at the time of interview.  The pt provided permission to discuss medical plan with the family. Opportunity was given to ask question and all questions were answered satisfactorily.   Disposition:  Pt is from home, admitted with pancytopenia, still has low platelet count, neutropenic and low hemoglobin, which precludes a safe discharge. Discharge to home, when pancytopenia improves and cleared by oncologist.  Subjective: No overnight issues, patient denied any active bleeding, no headache or dizziness.  Denied any chest pain or shortness of breath.  No abdominal pain, no nausea vomiting or diarrhea.   Physical Exam:  General:  alert oriented to time, place, and person.  Appear in no distress, affect appropriate Eyes: PERRLA ENT: Oral Mucosa Clear, moist  Neck: no JVD,  Cardiovascular: S1 and S2 Present, no Murmur,  Respiratory: good respiratory effort, Bilateral Air entry equal and  Decreased, no Crackles, no wheezes Abdomen: Bowel Sound present, Soft and no tenderness,  Skin: no rashes Extremities: no Pedal edema, no calf tenderness Neurologic: without any new focal findings Gait not checked due to patient safety concerns  Vitals:   10/07/20 0457 10/07/20 0800 10/07/20 1205 10/07/20 1232  BP: (!) 92/56 (!) 89/53 (!) 82/56 96/62  Pulse: 62 60 66 70  Resp: 18 18 16 18   Temp: 98.1 F (36.7 C) (!) 97.5 F (36.4 C) 97.8 F (36.6 C) 98.6 F (37 C)  TempSrc:  Oral Oral Oral  SpO2: 100%  100% 100%  Weight: 72.6 kg     Height:        Intake/Output Summary (Last 24 hours) at 10/07/2020 1349 Last data filed at 10/07/2020 0500 Gross per 24 hour  Intake 220.08 ml  Output --  Net 220.08 ml   Filed Weights   10/05/20 0336 10/06/20 0516 10/07/20 0457  Weight: 72.1 kg 75.3 kg 72.6 kg    Data Reviewed: I have personally reviewed and interpreted daily labs, tele strips, imagings as discussed above. I reviewed all nursing notes, pharmacy notes, vitals, pertinent old records I have discussed plan of care as described above with RN and patient/family.  CBC: Recent Labs  Lab 10/04/20 1301 10/04/20 2205 10/05/20 0536 10/05/20 0277 10/05/20 2312 10/06/20 0410 10/06/20 0903 10/07/20 0630  WBC 0.2* 0.1* 0.2* 0.2* 0.5* 0.6* 0.8* 1.7*  NEUTROABS 0.1* 0.1* 0.1*  --   --  0.4*  --  1.1*  HGB 5.5* 5.5* 5.6* 5.9* 7.1* 7.2* 6.9* 6.3*  HCT 16.8* 16.6* 16.4* 17.6* 20.3* 21.1* 20.6* 19.0*  MCV 98.8 95.4 95 95.7 89.4 90.2 90.4 93.6  PLT 7* 13* 14* 11* 25* 28* 26* 26*   Basic Metabolic Panel: Recent Labs  Lab 10/04/20 1111 10/04/20 1301 10/05/20 0536 10/06/20 0410 10/07/20 0630  NA 136 133*  --  136 138  K 3.9 3.9  --  3.5 3.1*  CL 104 104  --  108 110  CO2 22 21*  --  21* 21*  GLUCOSE 141* 131*  --  100* 95  BUN 30* 34*  --  25* 17  CREATININE 1.01 0.94 0.89 0.87 0.80  CALCIUM 8.8* 8.7*  --  8.3* 8.3*  MG  --   --   --  1.8 2.1  PHOS  --   --   --  3.7 3.4     Studies: No results found.  Scheduled Meds: . acyclovir  400 mg Oral BID  . bictegravir-emtricitabine-tenofovir AF  1 tablet Oral Daily  . chlorhexidine  15 mL Mouth/Throat BID  . Chlorhexidine Gluconate Cloth  6 each Topical Daily  . feeding supplement  237 mL Oral TID BM  . gabapentin  100 mg Oral TID  . multivitamin with minerals  1 tablet Oral Daily  . potassium chloride  40 mEq Oral Q6H  . sulfamethoxazole-trimethoprim  1 tablet Oral Daily   Continuous Infusions: . sodium chloride Stopped (10/07/20 0030)   PRN Meds: sodium chloride, acetaminophen **OR** acetaminophen, magic mouthwash w/lidocaine, ondansetron **OR** ondansetron (ZOFRAN) IV, oxyCODONE  Time spent: 35 minutes  Author: Val Riles. MD [facility] 10/07/2020 1:49 PM  To reach On-call, see care teams to  locate the attending and reach out to them via [email or web address]. If 7PM-7AM, please contact night-coverage If you still have difficulty reaching the attending provider, please page the [facility] (Director on Call) for [facility] on amion for assistance.

## 2020-10-07 NOTE — Progress Notes (Signed)
Mobility Specialist - Progress Note   10/07/20 1058  Mobility  Activity Contraindicated/medical hold  Mobility performed by Mobility specialist    Per chart review, pt with low HgB level of 6.3 this date, contraindicating mobility session. Will monitor and attempt session when appropriate for exertional activity.    Kathee Delton Mobility Specialist 10/07/20, 10:59 AM

## 2020-10-07 NOTE — Plan of Care (Signed)

## 2020-10-07 NOTE — Care Management Important Message (Signed)
Important Message  Patient Details  Name: Edouard Gikas MRN: [id number] Date of Birth: 1945-12-02   Medicare Important Message Given:  Yes     Dannette Barbara 10/07/2020, 1:23 PM

## 2020-10-08 LAB — CBC WITH DIFFERENTIAL/PLATELET
Abs Immature Granulocytes: 0.3 10*3/uL — ABNORMAL HIGH (ref 0.00–0.07)
Basophils Absolute: 0 10*3/uL (ref 0.0–0.1)
Basophils Relative: 1 %
Eosinophils Absolute: 0.1 10*3/uL (ref 0.0–0.5)
Eosinophils Relative: 2 %
HCT: 25.8 % — ABNORMAL LOW (ref 39.0–52.0)
Hemoglobin: 8.4 g/dL — ABNORMAL LOW (ref 13.0–17.0)
Immature Granulocytes: 9 %
Lymphocytes Relative: 5 %
Lymphs Abs: 0.2 10*3/uL — ABNORMAL LOW (ref 0.7–4.0)
MCH: 30.3 pg (ref 26.0–34.0)
MCHC: 32.6 g/dL (ref 30.0–36.0)
MCV: 93.1 fL (ref 80.0–100.0)
Monocytes Absolute: 0.5 10*3/uL (ref 0.1–1.0)
Monocytes Relative: 17 %
Neutro Abs: 2.1 10*3/uL (ref 1.7–7.7)
Neutrophils Relative %: 66 %
Platelets: 35 10*3/uL — ABNORMAL LOW (ref 150–400)
RBC: 2.77 MIL/uL — ABNORMAL LOW (ref 4.22–5.81)
RDW: 17.9 % — ABNORMAL HIGH (ref 11.5–15.5)
Smear Review: NORMAL
WBC: 3.2 10*3/uL — ABNORMAL LOW (ref 4.0–10.5)
nRBC: 0.9 % — ABNORMAL HIGH (ref 0.0–0.2)

## 2020-10-08 LAB — FOLATE: Folate: 7.4 ng/mL (ref >5.9)

## 2020-10-08 LAB — BASIC METABOLIC PANEL
Anion gap: 5 (ref 5–15)
BUN: 14 mg/dL (ref 8–23)
CO2: 22 mmol/L (ref 22–32)
Calcium: 8.5 mg/dL — ABNORMAL LOW (ref 8.9–10.3)
Chloride: 113 mmol/L — ABNORMAL HIGH (ref 98–111)
Creatinine, Ser: 0.69 mg/dL (ref 0.61–1.24)
GFR, Estimated: >60 mL/min (ref >60)
Glucose, Bld: 101 mg/dL — ABNORMAL HIGH (ref 70–99)
Potassium: 4.1 mmol/L (ref 3.5–5.1)
Sodium: 140 mmol/L (ref 135–145)

## 2020-10-08 LAB — TYPE AND SCREEN
ABO/RH(D): O POS
Antibody Screen: NEGATIVE
Unit division: 0
Unit division: 0
Unit division: 0
Unit division: 0

## 2020-10-08 LAB — HEPATIC FUNCTION PANEL
ALT: 23 U/L (ref 0–44)
AST: 23 U/L (ref 15–41)
Albumin: 2.8 g/dL — ABNORMAL LOW (ref 3.5–5.0)
Alkaline Phosphatase: 66 U/L (ref 38–126)
Bilirubin, Direct: <0.1 mg/dL (ref 0.0–0.2)
Total Bilirubin: 0.4 mg/dL (ref 0.3–1.2)
Total Protein: 5.6 g/dL — ABNORMAL LOW (ref 6.5–8.1)

## 2020-10-08 LAB — BPAM RBC
Blood Product Expiration Date: 202203232359
Blood Product Expiration Date: 202203232359
Blood Product Expiration Date: 202203232359
Blood Product Expiration Date: 202204072359
ISSUE DATE / TIME: 202203081748
ISSUE DATE / TIME: 202203091216
ISSUE DATE / TIME: 202203091550
ISSUE DATE / TIME: 202203111157
Unit Type and Rh: 5100
Unit Type and Rh: 5100
Unit Type and Rh: 9500
Unit Type and Rh: 9500

## 2020-10-08 LAB — RETICULOCYTES
Immature Retic Fract: 38 % — ABNORMAL HIGH (ref 2.3–15.9)
RBC.: 2.75 MIL/uL — ABNORMAL LOW (ref 4.22–5.81)
Retic Count, Absolute: 44.8 10*3/uL (ref 19.0–186.0)
Retic Ct Pct: 1.6 % (ref 0.4–3.1)

## 2020-10-08 LAB — VITAMIN B12: Vitamin B-12: 667 pg/mL (ref 180–914)

## 2020-10-08 LAB — EPSTEIN BARR VRS(EBV DNA BY PCR): EBV DNA QN by PCR: NEGATIVE IU/mL

## 2020-10-08 LAB — FUNGITELL, SERUM: Fungitell Result: <31 pg/mL (ref <80)

## 2020-10-08 LAB — PHOSPHORUS: Phosphorus: 3 mg/dL (ref 2.5–4.6)

## 2020-10-08 LAB — MAGNESIUM: Magnesium: 2.1 mg/dL (ref 1.7–2.4)

## 2020-10-08 LAB — TSH: TSH: 4.741 u[IU]/mL — ABNORMAL HIGH (ref 0.350–4.500)

## 2020-10-08 MED ORDER — SODIUM CHLORIDE 0.9 % IV SOLN: INTRAVENOUS | Status: DC

## 2020-10-08 MED ORDER — SODIUM CHLORIDE 0.9 % IV BOLUS
1000.0000 mL | Freq: Once | INTRAVENOUS | Status: AC
  Administered 2020-10-08: 1000 mL via INTRAVENOUS

## 2020-10-08 MED ORDER — HEPARIN SOD (PORK) LOCK FLUSH 100 UNIT/ML IV SOLN
500.0000 [IU] | Freq: Once | INTRAVENOUS | Status: AC
  Administered 2020-10-08: 500 [IU] via INTRAVENOUS
  Filled 2020-10-08: qty 5

## 2020-10-08 NOTE — Plan of Care (Signed)
Problem: Education: Goal: Knowledge of General Education information will improve Description: Including pain rating scale, medication(s)/side effects and non-pharmacologic comfort measures Outcome: Progressing   Problem: Health Behavior/Discharge Planning: Goal: Ability to manage health-related needs will improve Outcome: Progressing   Problem: Clinical Measurements: Goal: Ability to maintain clinical measurements within normal limits will improve Outcome: Progressing Goal: Will remain free from infection Outcome: Progressing Goal: Diagnostic test results will improve Outcome: Progressing Goal: Respiratory complications will improve Outcome: Progressing Goal: Cardiovascular complication will be avoided Outcome: Progressing   Problem: Activity: Goal: Risk for activity intolerance will decrease Outcome: Progressing   Problem: Nutrition: Goal: Adequate nutrition will be maintained Outcome: Progressing   Problem: Coping: Goal: Level of anxiety will decrease Outcome: Progressing   Problem: Elimination: Goal: Will not experience complications related to bowel motility Outcome: Progressing Goal: Will not experience complications related to urinary retention Outcome: Progressing   Problem: Pain Managment: Goal: General experience of comfort will improve Outcome: Progressing   Problem: Safety: Goal: Ability to remain free from injury will improve Outcome: Progressing   Problem: Skin Integrity: Goal: Risk for impaired skin integrity will decrease Outcome: Progressing   Problem: Education: Goal: Knowledge of General Education information will improve Description: Including pain rating scale, medication(s)/side effects and non-pharmacologic comfort measures Outcome: Progressing   Problem: Health Behavior/Discharge Planning: Goal: Ability to manage health-related needs will improve Outcome: Progressing   Problem: Clinical Measurements: Goal: Ability to maintain  clinical measurements within normal limits will improve Outcome: Progressing Goal: Will remain free from infection Outcome: Progressing Goal: Diagnostic test results will improve Outcome: Progressing Goal: Respiratory complications will improve Outcome: Progressing Goal: Cardiovascular complication will be avoided Outcome: Progressing   Problem: Activity: Goal: Risk for activity intolerance will decrease Outcome: Progressing   Problem: Nutrition: Goal: Adequate nutrition will be maintained Outcome: Progressing   Problem: Coping: Goal: Level of anxiety will decrease Outcome: Progressing   Problem: Elimination: Goal: Will not experience complications related to bowel motility Outcome: Progressing Goal: Will not experience complications related to urinary retention Outcome: Progressing   Problem: Pain Managment: Goal: General experience of comfort will improve Outcome: Progressing   Problem: Safety: Goal: Ability to remain free from injury will improve Outcome: Progressing   Problem: Skin Integrity: Goal: Risk for impaired skin integrity will decrease Outcome: Progressing   Problem: Education: Goal: Knowledge of General Education information will improve Description: Including pain rating scale, medication(s)/side effects and non-pharmacologic comfort measures Outcome: Progressing   Problem: Health Behavior/Discharge Planning: Goal: Ability to manage health-related needs will improve Outcome: Progressing   Problem: Clinical Measurements: Goal: Ability to maintain clinical measurements within normal limits will improve Outcome: Progressing Goal: Will remain free from infection Outcome: Progressing Goal: Diagnostic test results will improve Outcome: Progressing Goal: Respiratory complications will improve Outcome: Progressing Goal: Cardiovascular complication will be avoided Outcome: Progressing   Problem: Activity: Goal: Risk for activity intolerance will  decrease Outcome: Progressing   Problem: Nutrition: Goal: Adequate nutrition will be maintained Outcome: Progressing   Problem: Coping: Goal: Level of anxiety will decrease Outcome: Progressing   Problem: Elimination: Goal: Will not experience complications related to bowel motility Outcome: Progressing Goal: Will not experience complications related to urinary retention Outcome: Progressing   Problem: Pain Managment: Goal: General experience of comfort will improve Outcome: Progressing   Problem: Safety: Goal: Ability to remain free from injury will improve Outcome: Progressing   Problem: Skin Integrity: Goal: Risk for impaired skin integrity will decrease Outcome: Progressing   Problem: Education: Goal: Knowledge of General Education  information will improve Description: Including pain rating scale, medication(s)/side effects and non-pharmacologic comfort measures Outcome: Progressing   Problem: Health Behavior/Discharge Planning: Goal: Ability to manage health-related needs will improve Outcome: Progressing   Problem: Clinical Measurements: Goal: Ability to maintain clinical measurements within normal limits will improve Outcome: Progressing Goal: Will remain free from infection Outcome: Progressing Goal: Diagnostic test results will improve Outcome: Progressing Goal: Respiratory complications will improve Outcome: Progressing Goal: Cardiovascular complication will be avoided Outcome: Progressing   Problem: Activity: Goal: Risk for activity intolerance will decrease Outcome: Progressing   Problem: Nutrition: Goal: Adequate nutrition will be maintained Outcome: Progressing   Problem: Coping: Goal: Level of anxiety will decrease Outcome: Progressing   Problem: Elimination: Goal: Will not experience complications related to bowel motility Outcome: Progressing Goal: Will not experience complications related to urinary retention Outcome: Progressing    Problem: Pain Managment: Goal: General experience of comfort will improve Outcome: Progressing   Problem: Safety: Goal: Ability to remain free from injury will improve Outcome: Progressing   Problem: Skin Integrity: Goal: Risk for impaired skin integrity will decrease Outcome: Progressing    Pt is AAOx4. Pt denies any pain. VSS. Pt afebrile. Pt BP this morning is high 80s, MAP 65. Green mews. HGB and PLT is WNL. All needs attended. Call light is within reach. Safety measures maintained. Will continue to monitor.

## 2020-10-08 NOTE — Plan of Care (Signed)

## 2020-10-08 NOTE — Discharge Summary (Signed)
Physician Discharge Summary  Slayden Mennenga KYH:[id number] DOB: [birth year] DOA: 10/04/2020  PCP: Olin Hauser, DO  Admit date: 10/04/2020 Discharge date: 10/08/2020  Admitted From: Home Disposition:  Home  Discharge Condition:Stable CODE STATUS:FULL Diet recommendation:  Regular    Brief/Interim Summary: Patient is a 75 year old male with history of AIDS, B-cell lymphoma, BPH who presents to the emergency department after his oncologist referred him for the evaluation of abnormal labs, fever.  He was found to be febrile with temperature of 101.2 at oncology clinic, platelets were low at 8, ANC 100.  Patient was admitted and was started on broad-spectrum antibiotics.    Currently is hemodynamically stable, afebrile, blood work is showing improvement regarding pancytopenia.  ID and oncology were following cleared for discharge.  Patient was noted to be hypotensive this morning and was given IV fluids with improvement in the blood pressure.  Patient states his blood pressure usually  runs low.  I recommended him to stay 1 more night so that we can monitor him closely but he is adamant to be discharged to home.  Following problems were addressed during his hospitalization:  Pancytopenia: Presented with severe thrombocytopenia, ANC 100.  Patient was on chemotherapy as an outpatient. He was transfused with a unit of platelets, PRBC.  Hemoglobin currently stable in the range of 8.  Platelets level improving. Patient will follow up with oncology as an outpatient.  We recommend to follow-up with PCP in a week and do a CBC test during the follow-up.  Neutropenic fever: Presented with ANC of 100 on presentation.  Currently WBC count improving.  He was given broad-spectrum antibiotics, now discontinued as per ID.  Blood cultures have been negative so far.  ID doesnot  not recommend any more antibiotics  Hypokalemia: Supplemented and corrected  Protein calorie malnutrition: Dietitian were  following.  AIDS: Diagnosed in September 2021.  On HIV therapy.Started on Biktarvy October 2021. Viral load decreased from 600000 s to 130. CD4 has increased from 64>.224His HIV is under good control with Biktarvy.  He was on  PCP prophylaxis, Bactrim and Also on acyclovir for herpes prophylaxis.  Bactrim discontinued on discharge due to pancytopenia and as recommended by ID  Hypotension:  Patient does not appear septic.  No fever.  Kidney function stable.    BP improved after he was given IV fluids.  States his blood pressure usually runs low.  Denies any dizziness or lightheadedness.   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[facility])   Chemotherapy-induced neutropenia ([facility])   Debility   Protein malnutrition ([facility])   Neutropenia with fever [facility])    Discharge Instructions  Discharge Instructions    Diet general   Complete by: As directed    Discharge instructions   Complete by: As directed    1)Please continue taking your home medications 2)Follow up with your PCP within a week 3)Follow up with your oncologist as an outpatient   Increase activity slowly   Complete by: As directed      Allergies as of 10/08/2020   No Known Allergies     Medication List    STOP taking these medications   ciprofloxacin 500 MG tablet Commonly known as: Cipro   sulfamethoxazole-trimethoprim 800-160 MG tablet Commonly known as: BACTRIM DS     TAKE these medications   acyclovir 400 MG tablet Commonly  known as: ZOVIRAX Take 1 tablet (400 mg total) by mouth 2 (two) times daily.   bictegravir-emtricitabine-tenofovir AF 50-200-25 MG Tabs tablet Commonly known as: BIKTARVY Take 1 tablet by mouth daily.   chlorhexidine 0.12 % solution Commonly known as: Peridex Use as directed 15 mLs in the mouth or throat 2 (two)  times daily.   gabapentin 100 MG capsule Commonly known as: Neurontin Take 1 capsule (100 mg total) by mouth 3 (three) times daily.   hydrocortisone 2.5 % rectal cream Commonly known as: Anusol-HC Place 1 application rectally 2 (two) times daily. For up to 1-2 weeks. As needed hemorrhoids   lidocaine-prilocaine cream Commonly known as: EMLA Apply to affected area once   magic mouthwash w/lidocaine Soln Take 5 mLs by mouth 4 (four) times daily as needed for mouth pain. Sig: Swish/spit 5-10 ml four times a day as needed   ondansetron 8 MG tablet Commonly known as: Zofran Take 1 tablet (8 mg total) by mouth 2 (two) times daily as needed for refractory nausea / vomiting. Start on the third day after chemotherapy.   polyethylene glycol powder 17 GM/SCOOP powder Commonly known as: GLYCOLAX/MIRALAX Take 17 g by mouth daily as needed for moderate constipation or severe constipation.   predniSONE 50 MG tablet Commonly known as: DELTASONE Take 2 tablets on Day 2, 3, 4, 5 of each chemotherapy treatments.       Follow-up Information    Olin Hauser, DO. Schedule an appointment as soon as possible for a visit in 1 week(s).   Specialty: Family Medicine Contact information: [place] [address] [phone]              No Known Allergies  Consultations:  Oncology, ID   Procedures/Studies: DG Chest 2 View  Result Date: 10/04/2020 CLINICAL DATA:  Sepsis EXAM: CHEST - 2 VIEW COMPARISON:  03/30/2020 FINDINGS: Heart size is normal. Mediastinal shadows are normal. The lungs are clear. Power port on the right with the tip in the SVC. No significant bone finding. IMPRESSION: No active disease. Power port on the right. Electronically Signed   By: Nelson Chimes M.D.   On: 10/04/2020 13:54   DG FLUORO GUIDED LOC OF NEEDLE/CATH TIP FOR SPINAL INJECT LT  Result Date: 09/28/2020 CLINICAL DATA:  Intrathecal methotrexate for high-grade B-cell lymphoma EXAM: DIAGNOSTIC  LUMBAR PUNCTURE UNDER FLUOROSCOPIC GUIDANCE COMPARISON:  09/07/2020 FLUOROSCOPY TIME:  Fluoroscopy Time:  0.1 minute Radiation Exposure Index (if provided by the fluoroscopic device): 0.2 mGy Number of Acquired Spot Images: 0 PROCEDURE: Informed consent was obtained from the patient prior to the procedure, including potential complications of headache, allergy, and pain. With the patient prone, the lower back was prepped with Betadine. 1% Lidocaine was used for local anesthesia. Lumbar puncture was performed at the L2-3 level using a 22 gauge needle with return of clear CSF. 12 mg of methotrexate was injected into the subarachnoid space. The patient tolerated the procedure well and there were no apparent complications. No immediate complications. IMPRESSION: Successful fluoroscopic guided lumbar puncture for intrathecal chemotherapy. Electronically Signed   By: Kathreen Devoid   On: 09/28/2020 13:12       Subjective: Patient seen and examined at the bedside this morning.  Was hypotensive earlier, blood pressure improved after giving IV fluids.  Patient does not want to stay 1 more night.  Discharge Exam: Vitals:   10/08/20 0800 10/08/20 1100  BP: (!) 98/58 (!) 102/55  Pulse: (!) 59 64  Resp: 20 20  Temp: 98.2 F (36.8 C)   SpO2: 97% 100%   Vitals:   10/07/20 1920 10/08/20 0430 10/08/20 0800 10/08/20 1100  BP: (!) 98/58 (!) 87/56 (!) 98/58 (!) 102/55  Pulse: 72 (!) 57 (!) 59 64  Resp: 17 20 20 20   Temp: 98.3 F (36.8 C) 98 F (36.7 C) 98.2 F (36.8 C)   TempSrc: Oral Oral Oral   SpO2: 99% 97% 97% 100%  Weight:  72.8 kg    Height:        General: Pt is alert, awake, not in acute distress Cardiovascular: RRR, S1/S2 +, no rubs, no gallops, Chemo-Port on the right chest Respiratory: CTA bilaterally, no wheezing, no rhonchi Abdominal: Soft, NT, ND, bowel sounds + Extremities: no edema, no cyanosis    The results of significant diagnostics from this hospitalization (including imaging,  microbiology, ancillary and laboratory) are listed below for reference.     Microbiology: Recent Results (from the past 240 hour(s))  Culture, blood (Routine x 2)     Status: None (Preliminary result)   Collection Time: 10/04/20  1:01 PM   Specimen: BLOOD  Result Value Ref Range Status   Specimen Description BLOOD RIGHT PORT  Final   Special Requests   Final    BOTTLES DRAWN AEROBIC AND ANAEROBIC Blood Culture adequate volume   Culture   Final    NO GROWTH 4 DAYS Performed at [facility], [address] [place], [place] [address]    Report Status PENDING  Incomplete  SARS CORONAVIRUS 2 (TAT 6-24 HRS) Nasopharyngeal Nasopharyngeal Swab     Status: None   Collection Time: 10/04/20  3:03 PM   Specimen: Nasopharyngeal Swab  Result Value Ref Range Status   SARS Coronavirus 2 NEGATIVE NEGATIVE Final    Comment: (NOTE) SARS-CoV-2 target nucleic acids are NOT DETECTED.  The SARS-CoV-2 RNA is generally detectable in upper and lower respiratory specimens during the acute phase of infection. Negative results do not preclude SARS-CoV-2 infection, do not rule out co-infections with other pathogens, and should not be used as the sole basis for treatment or other patient management decisions. Negative results must be combined with clinical observations, patient history, and epidemiological information. The expected result is Negative.  Fact Sheet for Patients: SugarRoll.be  Fact Sheet for Healthcare Providers: https://www.woods-mathews.com/  This test is not yet approved or cleared by the [place] FDA and  has been authorized for detection and/or diagnosis of SARS-CoV-2 by FDA under an Emergency Use Authorization (EUA). This EUA will remain  in effect (meaning this test can be used) for the duration of the COVID-19 declaration under Se ction 564(b)(1) of the Act, 21 U.S.C. section 360bbb-3(b)(1), unless the authorization is  terminated or revoked sooner.  Performed at [facility], [place] [address], [place], [place] [address]   Culture, blood (Routine x 2)     Status: None (Preliminary result)   Collection Time: 10/04/20  3:04 PM   Specimen: BLOOD  Result Value Ref Range Status   Specimen Description BLOOD RIGHT CHEST  Final   Special Requests   Final    BOTTLES DRAWN AEROBIC AND ANAEROBIC Blood Culture adequate volume   Culture   Final    NO GROWTH 4 DAYS Performed at [facility], [address] [place], [place] [address]    Report Status PENDING  Incomplete     Labs: BNP (last 3 results) No results for input(s): BNP in the last 8760 hours. Basic Metabolic Panel: Recent Labs  Lab 10/04/20 1111 10/04/20 1301 10/05/20 0536 10/06/20 0410 10/07/20 0630 10/08/20 0426  NA 136 133*  --  136 138 140  K 3.9 3.9  --  3.5 3.1* 4.1  CL 104 104  --  108 110 113*  CO2 22 21*  --  21* 21* 22  GLUCOSE 141* 131*  --  100* 95 101*  BUN 30* 34*  --  25* 17 14  CREATININE 1.01 0.94 0.89 0.87 0.80 0.69  CALCIUM 8.8* 8.7*  --  8.3* 8.3* 8.5*  MG  --   --   --  1.8 2.1 2.1  PHOS  --   --   --  3.7 3.4 3.0   Liver Function Tests: Recent Labs  Lab 10/04/20 1111 10/04/20 1301 10/06/20 0410 10/07/20 0630 10/08/20 0426  AST 20 26 17 15 23   ALT 22 24 23 18 23   ALKPHOS 62 67 52 55 66  BILITOT 0.7 0.7 1.1 0.5 0.4  PROT 6.6 6.8 5.7* 5.2* 5.6*  ALBUMIN 3.3* 3.4* 2.8* 2.7* 2.8*   No results for input(s): LIPASE, AMYLASE in the last 168 hours. No results for input(s): AMMONIA in the last 168 hours. CBC: Recent Labs  Lab 10/04/20 2205 10/05/20 0536 10/05/20 0938 10/05/20 2312 10/06/20 0410 10/06/20 0903 10/07/20 0630 10/07/20 1947 10/08/20 0426  WBC 0.1* 0.2*   < > 0.5* 0.6* 0.8* 1.7*  --  3.2*  NEUTROABS 0.1* 0.1*  --   --  0.4*  --  1.1*  --  2.1  HGB 5.5* 5.6*   < > 7.1* 7.2* 6.9* 6.3* 8.7* 8.4*  HCT 16.6* 16.4*   < > 20.3* 21.1* 20.6* 19.0* 25.1* 25.8*  MCV 95.4 95   < > 89.4  90.2 90.4 93.6  --  93.1  PLT 13* 14*   < > 25* 28* 26* 26*  --  35*   < > = values in this interval not displayed.   Cardiac Enzymes: No results for input(s): CKTOTAL, CKMB, CKMBINDEX, TROPONINI in the last 168 hours. BNP: Invalid input(s): POCBNP CBG: No results for input(s): GLUCAP in the last 168 hours. D-Dimer No results for input(s): DDIMER in the last 72 hours. Hgb A1c No results for input(s): HGBA1C in the last 72 hours. Lipid Profile No results for input(s): CHOL, HDL, LDLCALC, TRIG, CHOLHDL, LDLDIRECT in the last 72 hours. Thyroid function studies Recent Labs    10/08/20 0426  TSH 4.741*   Anemia work up Recent Labs    10/08/20 0426  FOLATE 7.4  RETICCTPCT 1.6   Urinalysis    Component Value Date/Time   COLORURINE YELLOW (A) 10/04/2020 1503   APPEARANCEUR HAZY (A) 10/04/2020 1503   LABSPEC 1.028 10/04/2020 1503   PHURINE 5.0 10/04/2020 1503   GLUCOSEU NEGATIVE 10/04/2020 1503   HGBUR NEGATIVE 10/04/2020 1503   BILIRUBINUR NEGATIVE 10/04/2020 1503   KETONESUR NEGATIVE 10/04/2020 1503   PROTEINUR NEGATIVE 10/04/2020 1503   NITRITE NEGATIVE 10/04/2020 1503   LEUKOCYTESUR NEGATIVE 10/04/2020 1503   Sepsis Labs Invalid input(s): PROCALCITONIN,  WBC,  LACTICIDVEN Microbiology Recent Results (from the past 240 hour(s))  Culture, blood (Routine x 2)     Status: None (Preliminary result)   Collection Time: 10/04/20  1:01 PM   Specimen: BLOOD  Result Value Ref Range Status   Specimen Description BLOOD RIGHT PORT  Final   Special Requests   Final    BOTTLES DRAWN AEROBIC AND ANAEROBIC Blood Culture adequate volume   Culture   Final  NO GROWTH 4 DAYS Performed at [facility], [place], [place] [address]    Report Status PENDING  Incomplete  SARS CORONAVIRUS 2 (TAT 6-24 HRS) Nasopharyngeal Nasopharyngeal Swab     Status: None   Collection Time: 10/04/20  3:03 PM   Specimen: Nasopharyngeal Swab  Result Value Ref Range Status    SARS Coronavirus 2 NEGATIVE NEGATIVE Final    Comment: (NOTE) SARS-CoV-2 target nucleic acids are NOT DETECTED.  The SARS-CoV-2 RNA is generally detectable in upper and lower respiratory specimens during the acute phase of infection. Negative results do not preclude SARS-CoV-2 infection, do not rule out co-infections with other pathogens, and should not be used as the sole basis for treatment or other patient management decisions. Negative results must be combined with clinical observations, patient history, and epidemiological information. The expected result is Negative.  Fact Sheet for Patients: SugarRoll.be  Fact Sheet for Healthcare Providers: https://www.woods-mathews.com/  This test is not yet approved or cleared by the [place] FDA and  has been authorized for detection and/or diagnosis of SARS-CoV-2 by FDA under an Emergency Use Authorization (EUA). This EUA will remain  in effect (meaning this test can be used) for the duration of the COVID-19 declaration under Se ction 564(b)(1) of the Act, 21 U.S.C. section 360bbb-3(b)(1), unless the authorization is terminated or revoked sooner.  Performed at [facility], [place] [address], [place], [place] [address]   Culture, blood (Routine x 2)     Status: None (Preliminary result)   Collection Time: 10/04/20  3:04 PM   Specimen: BLOOD  Result Value Ref Range Status   Specimen Description BLOOD RIGHT CHEST  Final   Special Requests   Final    BOTTLES DRAWN AEROBIC AND ANAEROBIC Blood Culture adequate volume   Culture   Final    NO GROWTH 4 DAYS Performed at [facility], [address] [place], [place] [address]    Report Status PENDING  Incomplete    Please note: You were cared for by a hospitalist during your hospital stay. Once you are discharged, your primary care physician will handle any further medical issues. Please note that NO REFILLS for any discharge  medications will be authorized once you are discharged, as it is imperative that you return to your primary care physician (or establish a relationship with a primary care physician if you do not have one) for your post hospital discharge needs so that they can reassess your need for medications and monitor your lab values.    Time coordinating discharge: 40 minutes  SIGNED:   Shelly Coss, MD  [facility] 10/08/2020, 11:52 AM Pager [phone]  If 7PM-7AM, please contact night-coverage [email or web address] Password [email or web address]

## 2020-10-09 LAB — CULTURE, BLOOD (ROUTINE X 2)
Culture: NO GROWTH
Culture: NO GROWTH
Special Requests: ADEQUATE
Special Requests: ADEQUATE

## 2020-10-10 ENCOUNTER — Other Ambulatory Visit: Payer: Self-pay

## 2020-10-10 ENCOUNTER — Other Ambulatory Visit: Payer: Medicare HMO

## 2020-10-10 DIAGNOSIS — B20 Human immunodeficiency virus [HIV] disease: Secondary | ICD-10-CM

## 2020-10-11 ENCOUNTER — Telehealth: Payer: Self-pay

## 2020-10-11 LAB — TOXOPLASMA ANTIBODIES- IGG AND  IGM
Toxoplasma Antibody- IgM: <3 AU/mL (ref 0.0–7.9)
Toxoplasma IgG Ratio: <3 IU/mL (ref 0.0–7.1)

## 2020-10-11 LAB — T-HELPER CELL (CD4) - (RCID CLINIC ONLY)
CD4 % Helper T Cell: 11 % — ABNORMAL LOW (ref 33–65)
CD4 T Cell Abs: 38 /uL — ABNORMAL LOW (ref 400–1790)

## 2020-10-11 NOTE — Telephone Encounter (Signed)
Transition Care Management Follow-up Telephone Call  Date of discharge and from where: 10/08/2020 [facility]  How have you been since you were released from the hospital? Pretty good  Any questions or concerns? No  Items Reviewed:  Did the pt receive and understand the discharge instructions provided? Yes   Medications obtained and verified? Yes   Other? No   Any new allergies since your discharge? No   Dietary orders reviewed? Yes  Do you have support at home? Yes   Home Care and Equipment/Supplies: Were home health services ordered? not applicable If so, what is the name of the agency? n/a  Has the agency set up a time to come to the patient's home? not applicable Were any new equipment or medical supplies ordered?  No What is the name of the medical supply agency? n/a Were you able to get the supplies/equipment? not applicable Do you have any questions related to the use of the equipment or supplies? No  Functional Questionnaire: (I = Independent and D = Dependent) ADLs: I  Bathing/Dressing- I  Meal Prep- I  Eating- I  Maintaining continence- I  Transferring/Ambulation- I  Managing Meds- I  Follow up appointments reviewed:   PCP Hospital f/u appt confirmed? Yes  Scheduled to see dr. Parks Wallace on 10/12/2020 @ 10:20.  Are transportation arrangements needed? No   If their condition worsens, is the pt aware to call PCP or go to the Emergency Dept.? Yes  Was the patient provided with contact information for the PCP's office or ED? Yes  Was to pt encouraged to call back with questions or concerns? Yes

## 2020-10-12 ENCOUNTER — Encounter: Payer: Self-pay | Admitting: Family Medicine

## 2020-10-12 ENCOUNTER — Other Ambulatory Visit: Payer: Self-pay

## 2020-10-12 ENCOUNTER — Ambulatory Visit (INDEPENDENT_AMBULATORY_CARE_PROVIDER_SITE_OTHER): Payer: Medicare HMO | Admitting: Family Medicine

## 2020-10-12 VITALS — BP 96/53 | HR 76 | Ht 69.0 in | Wt 167.4 lb

## 2020-10-12 DIAGNOSIS — D709 Neutropenia, unspecified: Secondary | ICD-10-CM | POA: Diagnosis not present

## 2020-10-12 DIAGNOSIS — K644 Residual hemorrhoidal skin tags: Secondary | ICD-10-CM | POA: Diagnosis not present

## 2020-10-12 DIAGNOSIS — R5081 Fever presenting with conditions classified elsewhere: Secondary | ICD-10-CM | POA: Diagnosis not present

## 2020-10-12 NOTE — Assessment & Plan Note (Signed)
Resolved after hospitalization. Had low WBC to 0.1 count, sent by Oncology and treated in hospital completed broad spectrum antibiotics and ID has discontinued them, no identified source of infection. Ultimately may have been related to chemotherapy causing symptoms. Now resolved - back to his baseline with regards to this ID checked lab CBC on 10/10/20 2 days ago with WBC 3.3, much improved Discussed today, not due for another repeat blood panel as recommended by hospital since just completed by ID He will follow up with ID on 3/28 and Oncology coming up in April/May 2022

## 2020-10-12 NOTE — Progress Notes (Signed)
Subjective:    Patient ID: Justin Wallace, male    DOB: 25-Jun-1946, 75 y.o.   MRN: [id number]  Justin Wallace is a 75 y.o. male presenting on 10/12/2020 for Hospitalization Follow-up and neutropenic fever   HPI  HOSPITAL FOLLOW-UP VISIT  Hospital/Location: [facility] Date of Admission: 10/04/20 Date of Discharge: 10/08/20 Transitions of care telephone call: 10/11/20 completed by Kellie Simmering LPN  Reason for Admission: Neutropenic Fever Primary (+Secondary) Diagnosis: Neutropenic Fever  - Hospital H&P and Discharge Summary have been reviewed - Patient presents today 4 days after recent hospitalization. Brief summary of recent course, patient had symptoms of malaise weakness febrile with temperature of 101.2 at oncology clinic, platelets were low at 8, ANC 100. Patient was admitted and was started on broad-spectrum antibiotics. No clear source of infection was identified. He did improve overall and labs improved, No oral antibiotics were continued. He was to resume prior medications on discharge.  - Today reports overall has done well after discharge. Symptoms of weakness, fatigue, malaise have resolved. He says chemotherapy  - New medications on discharge: None - Changes to current meds on discharge: None  I have reviewed the discharge medication list, and have reconciled the current and discharge medications today.  External Hemorrhoids / Constipation Prior initial evaluation with me on 08/23/20 for external hemorrhoids, recent flare up, had constipation from chemotherapy. Treated with rx Anusol for topical some relief. He is now more regular having BMs. Not taking much miralax recently. But still has rectal pain with bowel movements. Also even passing gas can have pain. Rare episode x 1 of bright red blood.    Current Outpatient Medications:  .  acyclovir (ZOVIRAX) 400 MG tablet, Take 1 tablet (400 mg total) by mouth 2 (two) times daily., Disp: 60 tablet, Rfl: 5 .   bictegravir-emtricitabine-tenofovir AF (BIKTARVY) 50-200-25 MG TABS tablet, Take 1 tablet by mouth daily., Disp: 30 tablet, Rfl: 5 .  chlorhexidine (PERIDEX) 0.12 % solution, Use as directed 15 mLs in the mouth or throat 2 (two) times daily., Disp: 473 mL, Rfl: 0 .  gabapentin (NEURONTIN) 100 MG capsule, Take 1 capsule (100 mg total) by mouth 3 (three) times daily., Disp: 90 capsule, Rfl: 1 .  hydrocortisone (ANUSOL-HC) 2.5 % rectal cream, Place 1 application rectally 2 (two) times daily. For up to 1-2 weeks. As needed hemorrhoids, Disp: 30 g, Rfl: 2 .  lidocaine-prilocaine (EMLA) cream, Apply to affected area once, Disp: 30 g, Rfl: 3 .  magic mouthwash w/lidocaine SOLN, Take 5 mLs by mouth 4 (four) times daily as needed for mouth pain. Sig: Swish/spit 5-10 ml four times a day as needed, Disp: 480 mL, Rfl: 1 .  ondansetron (ZOFRAN) 8 MG tablet, Take 1 tablet (8 mg total) by mouth 2 (two) times daily as needed for refractory nausea / vomiting. Start on the third day after chemotherapy., Disp: 30 tablet, Rfl: 1 .  polyethylene glycol powder (GLYCOLAX/MIRALAX) 17 GM/SCOOP powder, Take 17 g by mouth daily as needed for moderate constipation or severe constipation., Disp: , Rfl:  .  predniSONE (DELTASONE) 50 MG tablet, Take 2 tablets on Day 2, 3, 4, 5 of each chemotherapy treatments., Disp: 8 tablet, Rfl: 2  ------------------------------------------------------------------------- Social History   Tobacco Use  . Smoking status: Never Smoker  . Smokeless tobacco: Never Used  Vaping Use  . Vaping Use: Never used  Substance Use Topics  . Alcohol use: Yes    Comment: occasional  . Drug use: Never    Review  of Systems Per HPI unless specifically indicated above     Objective:    BP (!) 96/53   Pulse 76   Ht 5' 9"  (1.753 m)   Wt 167 lb 6.4 oz (75.9 kg)   SpO2 100%   BMI 24.72 kg/m   Wt Readings from Last 3 Encounters:  10/12/20 167 lb 6.4 oz (75.9 kg)  10/08/20 160 lb 9.6 oz (72.8 kg)   09/19/20 167 lb 3.2 oz (75.8 kg)    Physical Exam Vitals and nursing note reviewed.  Constitutional:      General: He is not in acute distress.    Appearance: He is well-developed. He is not diaphoretic.     Comments: Chronically ill but currently well appearing thin 75 yr old male, comfortable, cooperative  HENT:     Head: Normocephalic and atraumatic.  Eyes:     General:        Right eye: No discharge.        Left eye: No discharge.     Conjunctiva/sclera: Conjunctivae normal.  Cardiovascular:     Rate and Rhythm: Normal rate.  Pulmonary:     Effort: Pulmonary effort is normal.  Skin:    General: Skin is warm and dry.     Findings: No erythema or rash.  Neurological:     Mental Status: He is alert and oriented to person, place, and time.  Psychiatric:        Behavior: Behavior normal.     Comments: Well groomed, good eye contact, normal speech and thoughts      CLINICAL DATA:  Sepsis  EXAM: CHEST - 2 VIEW  COMPARISON:  03/30/2020  FINDINGS: Heart size is normal. Mediastinal shadows are normal. The lungs are clear. Power port on the right with the tip in the SVC. No significant bone finding.  IMPRESSION: No active disease. Power port on the right.   Electronically Signed   By: Nelson Chimes M.D.   On: 10/04/2020 13:54  Results for orders placed or performed in visit on 10/10/20  T-helper cell (CD4)- ([facility] clinic only)  Result Value Ref Range   CD4 T Cell Abs 38 (L) 400 - 1,790 /uL   CD4 % Helper T Cell 11 (L) 33 - 65 %  COMPLETE METABOLIC PANEL WITH GFR  Result Value Ref Range   Glucose, Bld 114 (H) 65 - 99 mg/dL   BUN 13 7 - 25 mg/dL   Creat 0.82 0.70 - 1.18 mg/dL   GFR, Est Non African American 88 > OR = 60 mL/min/1.23m   GFR, Est African American 102 > OR = 60 mL/min/1.737m  BUN/Creatinine Ratio NOT APPLICABLE 6 - 22 (calc)   Sodium 142 135 - 146 mmol/L   Potassium 4.0 3.5 - 5.3 mmol/L   Chloride 108 98 - 110 mmol/L   CO2 27 20 - 32  mmol/L   Calcium 8.6 8.6 - 10.3 mg/dL   Total Protein 5.6 (L) 6.1 - 8.1 g/dL   Albumin 3.4 (L) 3.6 - 5.1 g/dL   Globulin 2.2 1.9 - 3.7 g/dL (calc)   AG Ratio 1.5 1.0 - 2.5 (calc)   Total Bilirubin 0.3 0.2 - 1.2 mg/dL   Alkaline phosphatase (APISO) 88 35 - 144 U/L   AST 18 10 - 35 U/L   ALT 18 9 - 46 U/L  CBC with Differential/Platelet  Result Value Ref Range   WBC 3.3 (L) 3.8 - 10.8 Thousand/uL   RBC 3.04 (L) 4.20 - 5.80 Million/uL  Hemoglobin 9.7 (L) 13.2 - 17.1 g/dL   HCT 29.0 (L) 38.5 - 50.0 %   MCV 95.4 80.0 - 100.0 fL   MCH 31.9 27.0 - 33.0 pg   MCHC 33.4 32.0 - 36.0 g/dL   RDW 16.8 (H) 11.0 - 15.0 %   Platelets 64 (L) 140 - 400 Thousand/uL   MPV 12.1 7.5 - 12.5 fL   Neutro Abs 2,393 1,500 - 7,800 cells/uL   Lymphs Abs 330 (L) 850 - 3,900 cells/uL   Absolute Monocytes 528 200 - 950 cells/uL   Eosinophils Absolute 40 15 - 500 cells/uL   Basophils Absolute 10 0 - 200 cells/uL   Neutrophils Relative % 72.5 %   Total Lymphocyte 10.0 %   Monocytes Relative 16.0 %   Eosinophils Relative 1.2 %   Basophils Relative 0.3 %   Smear Review        Assessment & Plan:   Problem List Items Addressed This Visit    RESOLVED: Neutropenic fever ([place]) - Primary    Resolved after hospitalization. Had low WBC to 0.1 count, sent by Oncology and treated in hospital completed broad spectrum antibiotics and ID has discontinued them, no identified source of infection. Ultimately may have been related to chemotherapy causing symptoms. Now resolved - back to his baseline with regards to this ID checked lab CBC on 10/10/20 2 days ago with WBC 3.3, much improved Discussed today, not due for another repeat blood panel as recommended by hospital since just completed by ID He will follow up with ID on 3/28 and Oncology coming up in April/May 2022       Other Visit Diagnoses    Inflamed external hemorrhoid         Regarding hemorrhoids, recurrent issue has not resolved Temporary improved on  topical Anusol-HC, has refills use PRN still Discussed that likely if underlying digestion and source of bowel changes, constipation etc with chemo is still present will keep interfering. Now some improved BMs but still has rectal pain. Recommended consultation with General Surgery in future when or if ready for evaluation and may warrant further intervention.    No orders of the defined types were placed in this encounter.   Follow up plan: Return if symptoms worsen or fail to improve.   Nobie Putnam, [facility] 10/12/2020, 10:38 AM

## 2020-10-12 NOTE — Patient Instructions (Addendum)
Thank you for coming to the office today.  Keep up the good work overall.  Let me know when or if ready for consultation with General Surgery for External Hemorrhoid treatment and evaluation to check for internal hemorrhoids as well. May need a mechanical solution to that problem    For Constipation (less frequent bowel movement that can be hard dry or involve straining).  Recommend trying OTC Miralax 17g = 1 capful in large glass water once daily for now, try several days to see if working, goal is soft stool or BM 1-2 times daily, if too loose then reduce dose or try every other day. If not effective may need to increase it to 2 doses at once in AM or may do 1 in morning and 1 in afternoon/evening  - This medicine is very safe and can be used often without any problem and will not make you dehydrated. It is good for use on AS NEEDED BASIS or even MAINTENANCE therapy for longer term for several days to weeks at a time to help regulate bowel movements  Other more natural remedies or preventative treatment: - Increase hydration with water - Increase fiber in diet (high fiber foods = vegetables, leafy greens, oats/grains) - May take OTC Fiber supplement (metamucil powder or pill/gummy) - May try OTC Probiotic   Please schedule a Follow-up Appointment to: Return if symptoms worsen or fail to improve.  If you have any other questions or concerns, please feel free to call the office or send a message through [place]. You may also schedule an earlier appointment if necessary.  Additionally, you may be receiving a survey about your experience at our office within a few days to 1 week by e-mail or mail. We value your feedback.  Nobie Putnam, DO [place]

## 2020-10-13 LAB — CBC WITH DIFFERENTIAL/PLATELET
Absolute Monocytes: 528 cells/uL (ref 200–950)
Basophils Absolute: 10 cells/uL (ref 0–200)
Basophils Relative: 0.3 %
Eosinophils Absolute: 40 cells/uL (ref 15–500)
Eosinophils Relative: 1.2 %
HCT: 29 % — ABNORMAL LOW (ref 38.5–50.0)
Hemoglobin: 9.7 g/dL — ABNORMAL LOW (ref 13.2–17.1)
Lymphs Abs: 330 cells/uL — ABNORMAL LOW (ref 850–3900)
MCH: 31.9 pg (ref 27.0–33.0)
MCHC: 33.4 g/dL (ref 32.0–36.0)
MCV: 95.4 fL (ref 80.0–100.0)
MPV: 12.1 fL (ref 7.5–12.5)
Monocytes Relative: 16 %
Neutro Abs: 2393 cells/uL (ref 1500–7800)
Neutrophils Relative %: 72.5 %
Platelets: 64 10*3/uL — ABNORMAL LOW (ref 140–400)
RBC: 3.04 10*6/uL — ABNORMAL LOW (ref 4.20–5.80)
RDW: 16.8 % — ABNORMAL HIGH (ref 11.0–15.0)
Total Lymphocyte: 10 %
WBC: 3.3 10*3/uL — ABNORMAL LOW (ref 3.8–10.8)

## 2020-10-13 LAB — HIV-1 RNA QUANT-NO REFLEX-BLD
HIV 1 RNA Quant: 74 Copies/mL — ABNORMAL HIGH
HIV-1 RNA Quant, Log: 1.87 Log cps/mL — ABNORMAL HIGH

## 2020-10-13 LAB — COMPLETE METABOLIC PANEL WITH GFR
AG Ratio: 1.5 (calc) (ref 1.0–2.5)
ALT: 18 U/L (ref 9–46)
AST: 18 U/L (ref 10–35)
Albumin: 3.4 g/dL — ABNORMAL LOW (ref 3.6–5.1)
Alkaline phosphatase (APISO): 88 U/L (ref 35–144)
BUN: 13 mg/dL (ref 7–25)
CO2: 27 mmol/L (ref 20–32)
Calcium: 8.6 mg/dL (ref 8.6–10.3)
Chloride: 108 mmol/L (ref 98–110)
Creat: 0.82 mg/dL (ref 0.70–1.18)
GFR, Est African American: 102 mL/min/{1.73_m2} (ref >=60)
GFR, Est Non African American: 88 mL/min/{1.73_m2} (ref >=60)
Globulin: 2.2 g/dL (calc) (ref 1.9–3.7)
Glucose, Bld: 114 mg/dL — ABNORMAL HIGH (ref 65–99)
Potassium: 4 mmol/L (ref 3.5–5.3)
Sodium: 142 mmol/L (ref 135–146)
Total Bilirubin: 0.3 mg/dL (ref 0.2–1.2)
Total Protein: 5.6 g/dL — ABNORMAL LOW (ref 6.1–8.1)

## 2020-10-21 ENCOUNTER — Other Ambulatory Visit (HOSPITAL_COMMUNITY): Payer: Self-pay

## 2020-10-24 ENCOUNTER — Encounter: Payer: Self-pay | Admitting: Internal Medicine

## 2020-10-24 ENCOUNTER — Other Ambulatory Visit: Payer: Self-pay

## 2020-10-24 ENCOUNTER — Ambulatory Visit (INDEPENDENT_AMBULATORY_CARE_PROVIDER_SITE_OTHER): Payer: Medicare HMO | Admitting: Internal Medicine

## 2020-10-24 VITALS — BP 110/71 | HR 76 | Temp 98.4°F | Wt 161.0 lb

## 2020-10-24 DIAGNOSIS — Z2989 Encounter for other specified prophylactic measures: Secondary | ICD-10-CM

## 2020-10-24 DIAGNOSIS — D709 Neutropenia, unspecified: Secondary | ICD-10-CM

## 2020-10-24 DIAGNOSIS — Z7185 Encounter for immunization safety counseling: Secondary | ICD-10-CM

## 2020-10-24 DIAGNOSIS — R5081 Fever presenting with conditions classified elsewhere: Secondary | ICD-10-CM | POA: Diagnosis not present

## 2020-10-24 DIAGNOSIS — Z298 Encounter for other specified prophylactic measures: Secondary | ICD-10-CM

## 2020-10-24 DIAGNOSIS — D61818 Other pancytopenia: Secondary | ICD-10-CM

## 2020-10-24 DIAGNOSIS — B20 Human immunodeficiency virus [HIV] disease: Secondary | ICD-10-CM

## 2020-10-24 DIAGNOSIS — C8510 Unspecified B-cell lymphoma, unspecified site: Secondary | ICD-10-CM | POA: Diagnosis not present

## 2020-10-24 LAB — CBC
HCT: 32.3 % — ABNORMAL LOW (ref 38.5–50.0)
Hemoglobin: 10.6 g/dL — ABNORMAL LOW (ref 13.2–17.1)
MCH: 31.2 pg (ref 27.0–33.0)
MCHC: 32.8 g/dL (ref 32.0–36.0)
MCV: 95 fL (ref 80.0–100.0)
MPV: 9.8 fL (ref 7.5–12.5)
Platelets: 196 10*3/uL (ref 140–400)
RBC: 3.4 10*6/uL — ABNORMAL LOW (ref 4.20–5.80)
RDW: 16.8 % — ABNORMAL HIGH (ref 11.0–15.0)
WBC: 4.6 10*3/uL (ref 3.8–10.8)

## 2020-10-24 NOTE — Progress Notes (Signed)
[facility] for Infectious Disease   CHIEF COMPLAINT    HIV follow up.  Last seen by me 07/19/20.  SUBJECTIVE:    Justin Wallace is a 75 y.o. male with PMHx as below who presents to the clinic for HIV follow up.   He was recently hospitalized at [facility] for neutropenic fever with cultures negative during admission.  He was discharged on 10/08/20 and TMP SMX was discontinued given his pancytopenia which was from his chemotherapy.  Labs from 10/10/20 show CD4 count 38 (11%) and viral load 74 copies, WBC 3.3, Hgb 9.7, platelets 64.  Please see A&P for the details of today's visit and status of the patient's medical problems.   Patient's Medications  New Prescriptions   SULFAMETHOXAZOLE-TRIMETHOPRIM (BACTRIM DS) 800-160 MG TABLET    Take 1 tablet by mouth daily.  Previous Medications   ACYCLOVIR (ZOVIRAX) 400 MG TABLET    Take 1 tablet (400 mg total) by mouth 2 (two) times daily.   BICTEGRAVIR-EMTRICITABINE-TENOFOVIR AF (BIKTARVY) 50-200-25 MG TABS TABLET    Take 1 tablet by mouth daily.   CHLORHEXIDINE (PERIDEX) 0.12 % SOLUTION    Use as directed 15 mLs in the mouth or throat 2 (two) times daily.   GABAPENTIN (NEURONTIN) 100 MG CAPSULE    Take 1 capsule (100 mg total) by mouth 3 (three) times daily.   HYDROCORTISONE (ANUSOL-HC) 2.5 % RECTAL CREAM    Place 1 application rectally 2 (two) times daily. For up to 1-2 weeks. As needed hemorrhoids   LIDOCAINE-PRILOCAINE (EMLA) CREAM    Apply to affected area once   MAGIC MOUTHWASH W/LIDOCAINE SOLN    Take 5 mLs by mouth 4 (four) times daily as needed for mouth pain. Sig: Swish/spit 5-10 ml four times a day as needed   ONDANSETRON (ZOFRAN) 8 MG TABLET    Take 1 tablet (8 mg total) by mouth 2 (two) times daily as needed for refractory nausea / vomiting. Start on the third day after chemotherapy.   POLYETHYLENE GLYCOL POWDER (GLYCOLAX/MIRALAX) 17 GM/SCOOP POWDER    Take 17 g by mouth daily as needed for moderate constipation or severe  constipation.   PREDNISONE (DELTASONE) 50 MG TABLET    Take 2 tablets on Day 2, 3, 4, 5 of each chemotherapy treatments.  Modified Medications   No medications on file  Discontinued Medications   No medications on file      Past Medical History:  Diagnosis Date  . Celiac disease   . 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 HIV (human immunodeficiency virus infection) ([place])   . Lyme disease   . Lymphoma ([place])     Social History   Tobacco Use  . Smoking status: Never Smoker  . Smokeless tobacco: Never Used  Vaping Use  . Vaping Use: Never used  Substance Use Topics  . Alcohol use: Yes    Comment: occasional  . Drug use: Never    Family History  Problem Relation Age of Onset  . Hyperlipidemia Mother   . Brain cancer Father     No Known Allergies  Review of Systems  Constitutional: Negative for chills and fever.  Respiratory: Negative.   Cardiovascular: Negative.   Gastrointestinal: Negative.   Genitourinary: Negative.      OBJECTIVE:    Vitals:   10/24/20 1048  BP: 110/71  Pulse: 76  Temp: 98.4 F (36.9 C)  TempSrc: Oral  SpO2: 98%  Weight: 161 lb (73 kg)  Body mass index is 23.78 kg/m.  Physical Exam Constitutional:      General: He is not in acute distress.    Appearance: Normal appearance.  Eyes:     Extraocular Movements: Extraocular movements intact.     Conjunctiva/sclera: Conjunctivae normal.  Pulmonary:     Effort: Pulmonary effort is normal. No respiratory distress.  Skin:    General: Skin is warm and dry.     Findings: No rash.  Neurological:     General: No focal deficit present.     Mental Status: He is alert and oriented to person, place, and time.  Psychiatric:        Mood and Affect: Mood normal.        Behavior: Behavior normal.     Labs and Microbiology: CMP Latest Ref Rng & Units 10/10/2020 10/08/2020 10/07/2020  Glucose 65 - 99 mg/dL 114(H) 101(H) 95  BUN 7 - 25 mg/dL 13 14 17   Creatinine 0.70 - 1.18 mg/dL  0.82 0.69 0.80  Sodium 135 - 146 mmol/L 142 140 138  Potassium 3.5 - 5.3 mmol/L 4.0 4.1 3.1(L)  Chloride 98 - 110 mmol/L 108 113(H) 110  CO2 20 - 32 mmol/L 27 22 21(L)  Calcium 8.6 - 10.3 mg/dL 8.6 8.5(L) 8.3(L)  Total Protein 6.1 - 8.1 g/dL 5.6(L) 5.6(L) 5.2(L)  Total Bilirubin 0.2 - 1.2 mg/dL 0.3 0.4 0.5  Alkaline Phos 38 - 126 U/L - 66 55  AST 10 - 35 U/L 18 23 15   ALT 9 - 46 U/L 18 23 18    CBC Latest Ref Rng & Units 10/24/2020 10/10/2020 10/08/2020  WBC 3.8 - 10.8 Thousand/uL 4.6 3.3(L) 3.2(L)  Hemoglobin 13.2 - 17.1 g/dL 10.6(L) 9.7(L) 8.4(L)  Hematocrit 38.5 - 50.0 % 32.3(L) 29.0(L) 25.8(L)  Platelets 140 - 400 Thousand/uL 196 64(L) 35(L)     Lab Results  Component Value Date   HIV1RNAQUANT 74 (H) 10/10/2020   HIV1RNAQUANT 70 10/05/2020   HIV1RNAQUANT 104 (H) 07/19/2020   CD4TABS 38 (L) 10/10/2020   CD4TABS 223 (L) 07/19/2020   CD4TABS 108 (L) 07/04/2020    RPR and STI: Lab Results  Component Value Date   LABRPR NON-REACTIVE 05/05/2020    STI Results GC CT  05/05/2020 Negative Negative    Hepatitis B: Lab Results  Component Value Date   HEPBSAB NON-REACTIVE 05/05/2020   HEPBSAG NON-REACTIVE 05/05/2020   HEPBCAB NON-REACTIVE 05/05/2020   Hepatitis C: Lab Results  Component Value Date   HEPCAB NON-REACTIVE 05/05/2020   Hepatitis A: Lab Results  Component Value Date   HAV REACTIVE (A) 05/05/2020   Lipids: Lab Results  Component Value Date   CHOL 158 05/05/2020   TRIG 92 05/05/2020   HDL 34 (L) 05/05/2020   CHOLHDL 4.6 05/05/2020   LDLCALC 105 (H) 05/05/2020    Imaging: CXR 10/04/20 IMPRESSION: No active disease. Power port on the right.   ASSESSMENT & PLAN:    Lymphoma [facility]) Patient continues to follow-up with Dr. Tasia Catchings and is currently on chemotherapy.  Need for pneumocystis prophylaxis Prescribed TMP SMX 1 double strength tablet daily for PCP prophylaxis given depressed CD4 count.  This was recently held in the setting of pancytopenia related  to his chemotherapy at hospital discharge.  CBC today shows resolution of his thrombocytopenia and leukopenia.  Mild anemia still present.  Will have him resume his TMP SMX at this time.  Pancytopenia ([place]) Repeat CBC today shows resolution of WBC and platelets.  Hemoglobin remains mildly low but improved.  Neutropenic fever ([place]) This is resolved after his recent hospitalization where he was treated with broad-spectrum antibiotics and cultures were negative.  CBC today shows continued normalization of his WBC.  HIV disease [facility]) Patient continues to take his Biktarvy daily and reports 100% adherence.  He is tolerating this well without any noted side effects at this time.  His CD4 count remains depressed in the setting of ongoing chemotherapy with recent measurement of 38 (11%).  His viral load remains above the level of detection at 74 copies but this is improved from 642,000 copies at diagnosis.  He will continue on Biktarvy as prescribed and will return to clinic in approximately 3 months with repeat labs beforehand.  His wife was also present today with questions regarding HIV transmission and natural progression of disease course.  These questions were answered to the best of my ability.  Vaccine counseling I have recommended that he receive his COVID booster.   Orders Placed This Encounter  Procedures  . CBC  . T-helper cell (CD4)- ([facility] clinic only)    Standing Status:   Future    Standing Expiration Date:   10/25/2021  . HIV-1 RNA quant-no reflex-bld    3 month viral load    Standing Status:   Future    Standing Expiration Date:   10/25/2021  . CBC    Standing Status:   Future    Standing Expiration Date:   10/25/2021  . Comprehensive metabolic panel    Standing Status:   Future    Standing Expiration Date:   10/25/2021       [name] for Infectious Disease [facility] 10/25/2020, 9:10 AM   I spent greater than 40 minutes dedicated to  the care of this patient on the date of this encounter to include pre-visit review of records, face-to-face time with the patient discussing HIV, pcp ppx, lymphoma, and post-visit ordering of testing.

## 2020-10-24 NOTE — Patient Instructions (Signed)
Thank you for coming to see me today. It was a pleasure seeing you.  To Do: Marland Kitchen Continue Biktarvy . Labs today and I will let you know about whether or not to resume Bactrim . I would recommend getting your COVID booster . Follow up with me in 3 months  If you have any questions or concerns, please do not hesitate to call the office at [phone].  Take Care,   Jule Ser, DO

## 2020-10-25 ENCOUNTER — Encounter: Payer: Self-pay | Admitting: Internal Medicine

## 2020-10-25 ENCOUNTER — Telehealth: Payer: Self-pay | Admitting: *Deleted

## 2020-10-25 MED ORDER — SULFAMETHOXAZOLE-TRIMETHOPRIM 800-160 MG PO TABS: 1.0000 | ORAL_TABLET | Freq: Every day | ORAL | 5 refills | Status: AC

## 2020-10-25 NOTE — Assessment & Plan Note (Signed)
I have recommended that he receive his COVID booster.

## 2020-10-25 NOTE — Assessment & Plan Note (Signed)
Patient continues to take his Biktarvy daily and reports 100% adherence.  He is tolerating this well without any noted side effects at this time.  His CD4 count remains depressed in the setting of ongoing chemotherapy with recent measurement of 38 (11%).  His viral load remains above the level of detection at 74 copies but this is improved from 642,000 copies at diagnosis.  He will continue on Biktarvy as prescribed and will return to clinic in approximately 3 months with repeat labs beforehand.  His wife was also present today with questions regarding HIV transmission and natural progression of disease course.  These questions were answered to the best of my ability.

## 2020-10-25 NOTE — Assessment & Plan Note (Signed)
Patient continues to follow-up with Dr. Tasia Catchings and is currently on chemotherapy.

## 2020-10-25 NOTE — Assessment & Plan Note (Signed)
This is resolved after his recent hospitalization where he was treated with broad-spectrum antibiotics and cultures were negative.  CBC today shows continued normalization of his WBC.

## 2020-10-25 NOTE — Telephone Encounter (Signed)
-----   Message from Mignon Pine, DO sent at 10/25/2020  9:11 AM EDT ----- Regarding: CBC 10/24/20 Can you please let pt know that his CBC yesterday showed improvement of his pancytopenia and I would like him to resume Bactrim.  I sent a new Rx to his pharmacy in case he needed it.  Thanks, Mitzi Hansen

## 2020-10-25 NOTE — Assessment & Plan Note (Signed)
Prescribed TMP SMX 1 double strength tablet daily for PCP prophylaxis given depressed CD4 count.  This was recently held in the setting of pancytopenia related to his chemotherapy at hospital discharge.  CBC today shows resolution of his thrombocytopenia and leukopenia.  Mild anemia still present.  Will have him resume his TMP SMX at this time.

## 2020-10-25 NOTE — Assessment & Plan Note (Signed)
Repeat CBC today shows resolution of WBC and platelets.  Hemoglobin remains mildly low but improved.

## 2020-10-25 NOTE — Telephone Encounter (Signed)
Relayed results and advice to patient per Dr Juleen China, confirmed which medication he is to resume. He had no further questions at this time, saw the results on mychart. He has some bactrim at home, will pick up more from [facility]. [name], RN

## 2020-11-03 ENCOUNTER — Other Ambulatory Visit (HOSPITAL_COMMUNITY): Payer: Self-pay

## 2020-11-08 ENCOUNTER — Other Ambulatory Visit (HOSPITAL_COMMUNITY): Payer: Self-pay

## 2020-11-08 ENCOUNTER — Other Ambulatory Visit: Payer: Self-pay | Admitting: Internal Medicine

## 2020-11-08 MED ORDER — BIKTARVY 50-200-25 MG PO TABS
1.0000 | ORAL_TABLET | Freq: Every day | ORAL | 2 refills | Status: DC
  Filled 2020-11-08: qty 30, 30d supply, fill #0
  Filled 2020-12-05: qty 30, 30d supply, fill #1
  Filled 2021-01-18: qty 30, 30d supply, fill #2

## 2020-11-09 ENCOUNTER — Other Ambulatory Visit (HOSPITAL_COMMUNITY): Payer: Self-pay

## 2020-11-14 ENCOUNTER — Other Ambulatory Visit: Payer: Self-pay

## 2020-11-14 ENCOUNTER — Other Ambulatory Visit (HOSPITAL_COMMUNITY): Payer: Self-pay

## 2020-11-14 ENCOUNTER — Inpatient Hospital Stay: Payer: Medicare HMO | Attending: Oncology

## 2020-11-14 DIAGNOSIS — Z452 Encounter for adjustment and management of vascular access device: Secondary | ICD-10-CM | POA: Diagnosis not present

## 2020-11-14 DIAGNOSIS — Z95828 Presence of other vascular implants and grafts: Secondary | ICD-10-CM

## 2020-11-14 DIAGNOSIS — C8510 Unspecified B-cell lymphoma, unspecified site: Secondary | ICD-10-CM | POA: Insufficient documentation

## 2020-11-14 MED ORDER — HEPARIN SOD (PORK) LOCK FLUSH 100 UNIT/ML IV SOLN
INTRAVENOUS | Status: AC
  Filled 2020-11-14: qty 5

## 2020-11-14 MED ORDER — SODIUM CHLORIDE 0.9% FLUSH
10.0000 mL | Freq: Once | INTRAVENOUS | Status: AC
  Administered 2020-11-14: 10 mL via INTRAVENOUS
  Filled 2020-11-14: qty 10

## 2020-11-14 MED ORDER — HEPARIN SOD (PORK) LOCK FLUSH 100 UNIT/ML IV SOLN
500.0000 [IU] | Freq: Once | INTRAVENOUS | Status: AC
Start: 2020-11-14 — End: 2020-11-14
  Administered 2020-11-14: 500 [IU] via INTRAVENOUS
  Filled 2020-11-14: qty 5

## 2020-11-15 ENCOUNTER — Other Ambulatory Visit: Payer: Self-pay | Admitting: Oncology

## 2020-11-26 LAB — MISC LABCORP TEST (SEND OUT): Labcorp test code: 183764

## 2020-11-29 ENCOUNTER — Ambulatory Visit: Admission: RE | Admit: 2020-11-29 | Payer: Medicare HMO | Source: Ambulatory Visit

## 2020-12-01 ENCOUNTER — Encounter
Admission: RE | Admit: 2020-12-01 | Discharge: 2020-12-01 | Disposition: A | Discharge status: Home / Self Care | Payer: Medicare HMO | Source: Ambulatory Visit | Attending: Oncology | Admitting: Oncology

## 2020-12-01 ENCOUNTER — Inpatient Hospital Stay: Payer: Medicare HMO

## 2020-12-01 ENCOUNTER — Inpatient Hospital Stay: Payer: Medicare HMO | Admitting: Oncology

## 2020-12-01 ENCOUNTER — Other Ambulatory Visit (HOSPITAL_COMMUNITY): Payer: Self-pay

## 2020-12-01 ENCOUNTER — Other Ambulatory Visit: Payer: Self-pay

## 2020-12-01 DIAGNOSIS — C8512 Unspecified B-cell lymphoma, intrathoracic lymph nodes: Secondary | ICD-10-CM | POA: Diagnosis not present

## 2020-12-01 DIAGNOSIS — I700 Atherosclerosis of aorta: Secondary | ICD-10-CM | POA: Insufficient documentation

## 2020-12-01 DIAGNOSIS — Z79899 Other long term (current) drug therapy: Secondary | ICD-10-CM | POA: Insufficient documentation

## 2020-12-01 DIAGNOSIS — C8510 Unspecified B-cell lymphoma, unspecified site: Secondary | ICD-10-CM | POA: Diagnosis not present

## 2020-12-01 LAB — GLUCOSE, CAPILLARY: Glucose-Capillary: 91 mg/dL (ref 70–99)

## 2020-12-01 MED ORDER — FLUDEOXYGLUCOSE F - 18 (FDG) INJECTION
8.3000 | Freq: Once | INTRAVENOUS | Status: AC | PRN
  Administered 2020-12-01: 8.64 via INTRAVENOUS

## 2020-12-05 ENCOUNTER — Inpatient Hospital Stay (HOSPITAL_BASED_OUTPATIENT_CLINIC_OR_DEPARTMENT_OTHER): Payer: Medicare HMO | Admitting: Oncology

## 2020-12-05 ENCOUNTER — Other Ambulatory Visit (HOSPITAL_COMMUNITY): Payer: Self-pay

## 2020-12-05 ENCOUNTER — Encounter: Payer: Self-pay | Admitting: Oncology

## 2020-12-05 ENCOUNTER — Inpatient Hospital Stay: Payer: Medicare HMO | Attending: Oncology

## 2020-12-05 VITALS — BP 103/64 | HR 75 | Temp 98.0°F | Resp 16 | Wt 166.9 lb

## 2020-12-05 DIAGNOSIS — D701 Agranulocytosis secondary to cancer chemotherapy: Secondary | ICD-10-CM

## 2020-12-05 DIAGNOSIS — T451X5A Adverse effect of antineoplastic and immunosuppressive drugs, initial encounter: Secondary | ICD-10-CM

## 2020-12-05 DIAGNOSIS — Z95828 Presence of other vascular implants and grafts: Secondary | ICD-10-CM | POA: Diagnosis not present

## 2020-12-05 DIAGNOSIS — G620 Drug-induced polyneuropathy: Secondary | ICD-10-CM

## 2020-12-05 DIAGNOSIS — B20 Human immunodeficiency virus [HIV] disease: Secondary | ICD-10-CM

## 2020-12-05 DIAGNOSIS — Z5111 Encounter for antineoplastic chemotherapy: Secondary | ICD-10-CM

## 2020-12-05 DIAGNOSIS — C8514 Unspecified B-cell lymphoma, lymph nodes of axilla and upper limb: Secondary | ICD-10-CM | POA: Insufficient documentation

## 2020-12-05 DIAGNOSIS — D6481 Anemia due to antineoplastic chemotherapy: Secondary | ICD-10-CM

## 2020-12-05 DIAGNOSIS — C8510 Unspecified B-cell lymphoma, unspecified site: Secondary | ICD-10-CM | POA: Diagnosis not present

## 2020-12-05 LAB — CBC WITH DIFFERENTIAL/PLATELET
Abs Immature Granulocytes: 0.39 10*3/uL — ABNORMAL HIGH (ref 0.00–0.07)
Basophils Absolute: 0 10*3/uL (ref 0.0–0.1)
Basophils Relative: 1 %
Eosinophils Absolute: 0.1 10*3/uL (ref 0.0–0.5)
Eosinophils Relative: 3 %
HCT: 34 % — ABNORMAL LOW (ref 39.0–52.0)
Hemoglobin: 11.4 g/dL — ABNORMAL LOW (ref 13.0–17.0)
Immature Granulocytes: 9 %
Lymphocytes Relative: 20 %
Lymphs Abs: 0.9 10*3/uL (ref 0.7–4.0)
MCH: 32 pg (ref 26.0–34.0)
MCHC: 33.5 g/dL (ref 30.0–36.0)
MCV: 95.5 fL (ref 80.0–100.0)
Monocytes Absolute: 0.5 10*3/uL (ref 0.1–1.0)
Monocytes Relative: 11 %
Neutro Abs: 2.4 10*3/uL (ref 1.7–7.7)
Neutrophils Relative %: 56 %
Platelets: 142 10*3/uL — ABNORMAL LOW (ref 150–400)
RBC: 3.56 MIL/uL — ABNORMAL LOW (ref 4.22–5.81)
RDW: 13.9 % (ref 11.5–15.5)
Smear Review: NORMAL
WBC: 4.3 10*3/uL (ref 4.0–10.5)
nRBC: 0 % (ref 0.0–0.2)

## 2020-12-05 LAB — COMPREHENSIVE METABOLIC PANEL
ALT: 13 U/L (ref 0–44)
AST: 26 U/L (ref 15–41)
Albumin: 3.8 g/dL (ref 3.5–5.0)
Alkaline Phosphatase: 76 U/L (ref 38–126)
Anion gap: 9 (ref 5–15)
BUN: 24 mg/dL — ABNORMAL HIGH (ref 8–23)
CO2: 23 mmol/L (ref 22–32)
Calcium: 9 mg/dL (ref 8.9–10.3)
Chloride: 105 mmol/L (ref 98–111)
Creatinine, Ser: 1.06 mg/dL (ref 0.61–1.24)
GFR, Estimated: >60 mL/min (ref >60)
Glucose, Bld: 126 mg/dL — ABNORMAL HIGH (ref 70–99)
Potassium: 3.9 mmol/L (ref 3.5–5.1)
Sodium: 137 mmol/L (ref 135–145)
Total Bilirubin: 0.7 mg/dL (ref 0.3–1.2)
Total Protein: 6.7 g/dL (ref 6.5–8.1)

## 2020-12-05 LAB — LACTATE DEHYDROGENASE: LDH: 248 U/L — ABNORMAL HIGH (ref 98–192)

## 2020-12-05 LAB — URIC ACID: Uric Acid, Serum: 5.7 mg/dL (ref 3.7–8.6)

## 2020-12-05 NOTE — Progress Notes (Signed)
Occasional neuropathy is stable.

## 2020-12-05 NOTE — Progress Notes (Signed)
Hematology/Oncology follow up note [facility] [phone] [phone]   Patient Care Team: Olin Hauser, DO as PCP - General (Family Medicine) Earlie Server, MD as Consulting Physician (Hematology and Oncology)  REFERRING PROVIDER: Nobie Putnam *  CHIEF COMPLAINTS/REASON FOR VISIT:  Follow up for axillary mass  HISTORY OF PRESENTING ILLNESS:   Justin Wallace is a  75 y.o.  male with PMH listed below was seen in consultation at the request of  Nobie Putnam *  for evaluation of symptomatic anemia Patient reported history of sudden onset of diffuse body aches, decreased appetite, headache low-grade fever, profound weakness/fatigue about 2 weeks ago.  Prior to the onset of symptoms, he went to a funeral as well as applicable clinic.  He reports that he has received COVID-19 vaccination previously.  He moved from [place].  Daughter is an Therapist, sports and told him to get COVID-19 PCR checked and he was tested negative. Patient also reports a sudden drop of weight since the onset of symptoms.  Patient has a history of celiac disease.  No colonoscopy records or pathology records in current EMR.  Diagnosis was done by [facility].  Patient reports that he was in his usual state of health until the onset of symptoms.  03/10/2020, patient was evaluated by primary care provider.  Blood work showed mild anemia with hemoglobin of 13.1, Iron panel showed decreased saturation of 8, ferritin 218, TIBC 291, patient was referred to hematology for evaluation of symptomatic anemia and iron deficiency.  Patient reports that her body aches, fever and headache symptoms have improved.  However he continues to feel very tired and fatigued.  Appetite has decreased and weight remains low.  # Physical examination showed left axillary mass  ultrasound confirmed a left axillary large soft tissue mass up to 6.8 cm with internal vascularity. 03/30/2020  CT chest with contrast showed soft tissue mass/enlarged lymph node in the left axilla 6.3 cm, concerning for primary mass or metastatic lymphadenopathy.  There are additional prominent although much smaller right axillary lymph nodes, pretracheal and superior mediastinal lymph nodes.  Findings are concerning for malignancy such as lymphoma or nodal metastatic disease.  # treated for Lymes disease, finished doxycycline 162m BID for 21 days course. 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 he has started treatment with Biktarvy, also started on Bactrim daily.  04/26/2020 PET scan showed large intensely hypermetabolic left axillary mass SUV 28.  Additional small bilateral axilla lymph node with mild metabolic activity SUV 2.  Metabolic activity through the porta hepatis region without enlarged nodes identified.  No bone lesions. 05/02/2020 bone marrow biopsy showed hypercellular marrow for age with trilineage hematopoiesis.  Increased number of CD10 positive B cells, cannot rule out minimal or subtle involvement of marrow by the high-grade B-cell lymphoma. Normal cytogenetics. 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  Positive for BCL6 and MYC expression.  FISH testing showed negative for BCL-2/BCL6/MYC gene rearrangement.   Trisomy 3 and 18 were identified and are nonspecific findings seen in clonal lymphoid neoplasm.  Final results were signed out on 05/30/2020-due to the delay of  FISH results.  B sed on the FISH results, lymphoma is best classified as high-grade B-cell lymphoma not otherwise specified.  It does not meet morphological criteria for diffuse large B-cell lymphoma nor molecular criteria for Burkitt lymphoma.  11 q. alteration testing has been sent.  Results are pending. 04/26/2020, MUGA testing showed normal  LVEF of 56% with normal LV wall motion.   05/13/2020, echocardiogram showed low normal end ejection fraction of 50 to  55%.  #04/19/2020 Mediport placed by Dr. Lucky Cowboy # Chemotherapy # 06/06/2020 cycle 1 R-EPOCH +GCSF                    Intrathecal MTX # 06/27/2020 cycle 2 R- EPOCH +GCSF [+ 1 level]  Intrathecal MTX # 07/18/2020 cycle 3 R -EPOCH +GCSF [+ 1 level]  Intrathecal MTX # 08/03/2020 PET negative. Deuville 3 # 08/08/2020 cycle 4 R EPOCH +GCSF  [+ 2 level]  Intrathecal MTX- did not tolerate due to cytopenia and mucositis # 08/29/2020 cycle 5 R -EPOCH +GCSF [+ 1 level]  Intrathecal MTX # 09/19/2020 cycle 6 R -EPOCH +GCSF [+ 1 level]  Intrathecal MTX  INTERVAL HISTORY Justin Wallace is a 75 y.o. male who has above history reviewed by me today presents for follow up visit for high-grade B-cell lymphoma and management of chemotherapy associated complications. Problems and complaints are listed below: Accompanied by wife. No complaints.  Denies any recent injury, joint pain, dysuria, abdominal pain, fever chills, wight loss, night sweats.    Review of Systems  Constitutional: Negative for appetite change, chills, fever and unexpected weight change.  HENT:   Negative for hearing loss, mouth sores and voice change.   Eyes: Negative for eye problems and icterus.  Respiratory: Negative for chest tightness, cough and shortness of breath.   Cardiovascular: Negative for chest pain and leg swelling.  Gastrointestinal: Negative for abdominal distention and abdominal pain.  Endocrine: Negative for hot flashes.  Genitourinary: Negative for difficulty urinating, dysuria and frequency.   Musculoskeletal: Negative for arthralgias.  Skin: Negative for itching and rash.  Neurological: Positive for numbness. Negative for light-headedness.  Hematological: Negative for adenopathy. Does not bruise/bleed easily.  Psychiatric/Behavioral: Negative for confusion.    MEDICAL HISTORY:  Past Medical History:  Diagnosis Date  . Celiac disease   . 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[place]) 05/27/2020  . HIV (human immunodeficiency virus  infection) ([place])   . Lyme disease   . Lymphoma ([place])     SURGICAL HISTORY: Past Surgical History:  Procedure Laterality Date  . PORTA CATH INSERTION N/A 04/25/2020   Procedure: PORTA CATH INSERTION;  Surgeon: Algernon Huxley, MD;  Location: [facility];  Service: Cardiovascular;  Laterality: N/A;    SOCIAL HISTORY: Social History   Socioeconomic History  . Marital status: Married    Spouse name: Opal Sidles   . Number of children: 2  . Years of education: Not on file  . Highest education level: Not on file  Occupational History  . Not on file  Tobacco Use  . Smoking status: Never Smoker  . Smokeless tobacco: Never Used  Vaping Use  . Vaping Use: Never used  Substance and Sexual Activity  . Alcohol use: Yes    Comment: occasional  . Drug use: Never  . Sexual activity: Never  Other Topics Concern  . Not on file  Social History Narrative  . Not on file   Social Determinants of Health   Financial Resource Strain: Not on file  Food Insecurity: Not on file  Transportation Needs: Not on file  Physical Activity: Not on file  Stress: Not on file  Social Connections: Not on file  Intimate Partner Violence: Not on file    FAMILY HISTORY: Family History  Problem Relation Age of Onset  . Hyperlipidemia Mother   . Brain cancer Father  ALLERGIES:  has No Known Allergies.  MEDICATIONS:  Current Outpatient Medications  Medication Sig Dispense Refill  . acyclovir (ZOVIRAX) 400 MG tablet Take 1 tablet (400 mg total) by mouth 2 (two) times daily. 60 tablet 5  . bictegravir-emtricitabine-tenofovir AF (BIKTARVY) 50-200-25 MG TABS tablet TAKE 1 TABLET BY MOUTH DAILY. 30 tablet 2  . gabapentin (NEURONTIN) 100 MG capsule TAKE 1 CAPSULE(100 MG) BY MOUTH THREE TIMES DAILY (Patient taking differently: 3 (three) times daily as needed.) 90 capsule 1  . lidocaine-prilocaine (EMLA) cream Apply to affected area once 30 g 3  . sulfamethoxazole-trimethoprim (BACTRIM DS) 800-160 MG  tablet Take 1 tablet by mouth 2 (two) times daily.    . chlorhexidine (PERIDEX) 0.12 % solution Use as directed 15 mLs in the mouth or throat 2 (two) times daily. (Patient not taking: Reported on 12/05/2020) 473 mL 0  . hydrocortisone (ANUSOL-HC) 2.5 % rectal cream Place 1 application rectally 2 (two) times daily. For up to 1-2 weeks. As needed hemorrhoids (Patient not taking: Reported on 12/05/2020) 30 g 2  . magic mouthwash w/lidocaine SOLN Take 5 mLs by mouth 4 (four) times daily as needed for mouth pain. Sig: Swish/spit 5-10 ml four times a day as needed (Patient not taking: Reported on 12/05/2020) 480 mL 1  . ondansetron (ZOFRAN) 8 MG tablet Take 1 tablet (8 mg total) by mouth 2 (two) times daily as needed for refractory nausea / vomiting. Start on the third day after chemotherapy. (Patient not taking: Reported on 12/05/2020) 30 tablet 1  . polyethylene glycol powder (GLYCOLAX/MIRALAX) 17 GM/SCOOP powder Take 17 g by mouth daily as needed for moderate constipation or severe constipation. (Patient not taking: Reported on 12/05/2020)    . predniSONE (DELTASONE) 50 MG tablet Take 2 tablets on Day 2, 3, 4, 5 of each chemotherapy treatments. (Patient not taking: Reported on 12/05/2020) 8 tablet 2   No current facility-administered medications for this visit.     PHYSICAL EXAMINATION: ECOG PERFORMANCE STATUS: 1 - Symptomatic but completely ambulatory Vitals:   12/05/20 1454  BP: 103/64  Pulse: 75  Resp: 16  Temp: 98 F (36.7 C)   Filed Weights   12/05/20 1454  Weight: 166 lb 14.4 oz (75.7 kg)    Physical Exam Constitutional:      General: He is not in acute distress. HENT:     Head: Normocephalic and atraumatic.  Eyes:     General: No scleral icterus. Cardiovascular:     Rate and Rhythm: Normal rate and regular rhythm.     Heart sounds: Normal heart sounds.  Pulmonary:     Effort: Pulmonary effort is normal. No respiratory distress.     Breath sounds: No wheezing.  Abdominal:     General:  Bowel sounds are normal. There is no distension.     Palpations: Abdomen is soft.  Musculoskeletal:        General: No deformity. Normal range of motion.     Cervical back: Normal range of motion and neck supple.     Comments: Left axillary mass is no longer palpable  Skin:    General: Skin is warm and dry.     Findings: No erythema or rash.  Neurological:     Mental Status: He is alert and oriented to person, place, and time. Mental status is at baseline.     Cranial Nerves: No cranial nerve deficit.     Coordination: Coordination normal.  Psychiatric:        Mood and Affect:  Mood normal.       LABORATORY DATA:  I have reviewed the data as listed Lab Results  Component Value Date   WBC 4.3 12/05/2020   HGB 11.4 (L) 12/05/2020   HCT 34.0 (L) 12/05/2020   MCV 95.5 12/05/2020   PLT 142 (L) 12/05/2020   Recent Labs    03/17/20 1029 05/05/20 0956 06/06/20 0822 10/06/20 0410 10/07/20 0630 10/08/20 0426 10/10/20 1018 12/05/20 1434  NA 137 139   < > 136 138 140 142 137  K 3.8 4.4   < > 3.5 3.1* 4.1 4.0 3.9  CL 104 107   < > 108 110 113* 108 105  CO2 27 27   < > 21* 21* 22 27 23   GLUCOSE 97 97   < > 100* 95 101* 114* 126*  BUN 21 19   < > 25* 17 14 13  24*  CREATININE 1.09 0.88   < > 0.87 0.80 0.69 0.82 1.06  CALCIUM 8.1* 9.1   < > 8.3* 8.3* 8.5* 8.6 9.0  GFRNONAA >60 86   < > >60 >60 >60 88 >60  GFRAA >60 100  --   --   --   --  102  --   PROT 8.0 7.4   < > 5.7* 5.2* 5.6* 5.6* 6.7  ALBUMIN 2.7*  --    < > 2.8* 2.7* 2.8*  --  3.8  AST 24 15   < > 17 15 23 18 26   ALT 18 8*   < > 23 18 23 18 13   ALKPHOS 140*  --    < > 52 55 66  --  76  BILITOT 0.3 0.5   < > 1.1 0.5 0.4 0.3 0.7  BILIDIR  --   --   --  0.2 <0.1 <0.1  --   --   IBILI  --   --   --  0.9 NOT CALCULATED NOT CALCULATED  --   --    < > = values in this interval not displayed.   Iron/TIBC/Ferritin/ %Sat    Component Value Date/Time   IRON 22 (L) 03/17/2020 1029   IRON 17 02/17/2018 0000   TIBC 286  03/17/2020 1029   TIBC 425 02/17/2018 0000   FERRITIN 284 03/17/2020 1029   IRONPCTSAT 8 (L) 03/17/2020 1029   IRONPCTSAT 8 (L) 03/10/2020 1113      RADIOGRAPHIC STUDIES: I have personally reviewed the radiological images as listed and agreed with the findings in the report. NM PET Image Restag (PS) Skull Base To Thigh  Result Date: 12/02/2020 CLINICAL DATA:  Subsequent treatment strategy for high-grade B-cell lymphoma. EXAM: NUCLEAR MEDICINE PET SKULL BASE TO THIGH TECHNIQUE: 8 point sick mCi F-18 FDG was injected intravenously. Full-ring PET imaging was performed from the skull base to thigh after the radiotracer. CT data was obtained and used for attenuation correction and anatomic localization. Fasting blood glucose: 91 mg/dl COMPARISON:  Multiple exams, including 08/03/2020 FINDINGS: Mediastinal blood pool activity: SUV max 2.0 Liver activity: SUV max 3.1 NECK: No significant abnormal hypermetabolic activity in this region. Incidental CT findings: Chronic left maxillary sinusitis. CHEST: Approximately 3.0 by 1.2 cm hypodense lesion adjacent to the right axillary neurovascular structures and tracking adjacent to the right biceps muscle, maximum SUV 7.6, possibly reflecting local bursitis/synovitis or a low-density Deauville 5 lymph node in this vicinity. This is higher in position than a typical axillary lymph node. Prior left axillary lymph 0.9 cm in short axis on image  86 series 3 (formerly 1.4 cm), maximum SUV 1.5 (formerly 2.5). This is currently Deauville 2. Incidental CT findings: Right Port-A-Cath tip: Right atrium. Coronary, aortic arch, and branch vessel atherosclerotic vascular disease. Low-density blood pool suggests anemia. Dependent subsegmental atelectasis in both lower lobes. ABDOMEN/PELVIS: Prior gastric activity has resolved. There are small but newly hypermetabolic left inguinal lymph nodes, including a somewhat superficial 0.8 cm in short axis left inguinal lymph node on image 252  of series 3 (previously 0.3 cm) with maximum SUV 5.6 (Deauville 4). Accentuated activity just posterior to the top of the urinary bladder is thought be physiologic activity in bowel. Incidental CT findings: Aortoiliac atherosclerotic vascular disease. Hypodense lesion in segment 3 of the liver is not hypermetabolic and likely benign. Left renal cysts appear photopenic. SKELETON: Subacute likely benign fracture of the left sixth rib anteriorly with associated low-grade activity, maximum SUV 3.9. 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 IMPRESSION: 1. New Deauville 4 left inguinal lymph nodes are not pathologically enlarged but have increased in size compared to previous. 2. Deauville 5 activity in a low-density collection adjacent to the right proximal biceps muscle, possibly synovitis/bursitis or adenopathy. This is higher in position than the typical axillary lymph node but is along proximal brachial neurovascular structures. 3. Reduced size and activity of the prior left axillary lymph node, currently Deauville 2. 4. Subacute healing likely benign left anterior sixth rib fracture. 5. Other imaging findings of potential clinical significance: Chronic left maxillary sinusitis. Aortic Atherosclerosis (ICD10-I70.0). Coronary atherosclerosis. Low-density blood pool suggests anemia. Left renal cysts. Electronically Signed   By: Van Clines M.D.   On: 12/02/2020 13:26   DG Chest 2 View  Result Date: 10/04/2020 CLINICAL DATA:  Sepsis EXAM: CHEST - 2 VIEW COMPARISON:  03/30/2020 FINDINGS: Heart size is normal. Mediastinal shadows are normal. The lungs are clear. Power port on the right with the tip in the SVC. No significant bone finding. IMPRESSION: No active disease. Power port on the right. Electronically Signed   By: Nelson Chimes M.D.   On: 10/04/2020 13:54   NM PET Image Restag (PS) Skull Base To Thigh  Result Date: 12/02/2020 CLINICAL DATA:  Subsequent treatment strategy for  high-grade B-cell lymphoma. EXAM: NUCLEAR MEDICINE PET SKULL BASE TO THIGH TECHNIQUE: 8 point sick mCi F-18 FDG was injected intravenously. Full-ring PET imaging was performed from the skull base to thigh after the radiotracer. CT data was obtained and used for attenuation correction and anatomic localization. Fasting blood glucose: 91 mg/dl COMPARISON:  Multiple exams, including 08/03/2020 FINDINGS: Mediastinal blood pool activity: SUV max 2.0 Liver activity: SUV max 3.1 NECK: No significant abnormal hypermetabolic activity in this region. Incidental CT findings: Chronic left maxillary sinusitis. CHEST: Approximately 3.0 by 1.2 cm hypodense lesion adjacent to the right axillary neurovascular structures and tracking adjacent to the right biceps muscle, maximum SUV 7.6, possibly reflecting local bursitis/synovitis or a low-density Deauville 5 lymph node in this vicinity. This is higher in position than a typical axillary lymph node. Prior left axillary lymph 0.9 cm in short axis on image 86 series 3 (formerly 1.4 cm), maximum SUV 1.5 (formerly 2.5). This is currently Deauville 2. Incidental CT findings: Right Port-A-Cath tip: Right atrium. Coronary, aortic arch, and branch vessel atherosclerotic vascular disease. Low-density blood pool suggests anemia. Dependent subsegmental atelectasis in both lower lobes. ABDOMEN/PELVIS: Prior gastric activity has resolved. There are small but newly hypermetabolic left inguinal lymph nodes, including a somewhat superficial 0.8 cm in short axis left inguinal  lymph node on image 252 of series 3 (previously 0.3 cm) with maximum SUV 5.6 (Deauville 4). Accentuated activity just posterior to the top of the urinary bladder is thought be physiologic activity in bowel. Incidental CT findings: Aortoiliac atherosclerotic vascular disease. Hypodense lesion in segment 3 of the liver is not hypermetabolic and likely benign. Left renal cysts appear photopenic. SKELETON: Subacute likely benign  fracture of the left sixth rib anteriorly with associated low-grade activity, maximum SUV 3.9. 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 IMPRESSION: 1. New Deauville 4 left inguinal lymph nodes are not pathologically enlarged but have increased in size compared to previous. 2. Deauville 5 activity in a low-density collection adjacent to the right proximal biceps muscle, possibly synovitis/bursitis or adenopathy. This is higher in position than the typical axillary lymph node but is along proximal brachial neurovascular structures. 3. Reduced size and activity of the prior left axillary lymph node, currently Deauville 2. 4. Subacute healing likely benign left anterior sixth rib fracture. 5. Other imaging findings of potential clinical significance: Chronic left maxillary sinusitis. Aortic Atherosclerosis (ICD10-I70.0). Coronary atherosclerosis. Low-density blood pool suggests anemia. Left renal cysts. Electronically Signed   By: Van Clines M.D.   On: 12/02/2020 13:26   DG FLUORO GUIDED LOC OF NEEDLE/CATH TIP FOR SPINAL INJECT LT  Result Date: 09/28/2020 CLINICAL DATA:  Intrathecal methotrexate for high-grade B-cell lymphoma EXAM: DIAGNOSTIC LUMBAR PUNCTURE UNDER FLUOROSCOPIC GUIDANCE COMPARISON:  09/07/2020 FLUOROSCOPY TIME:  Fluoroscopy Time:  0.1 minute Radiation Exposure Index (if provided by the fluoroscopic device): 0.2 mGy Number of Acquired Spot Images: 0 PROCEDURE: Informed consent was obtained from the patient prior to the procedure, including potential complications of headache, allergy, and pain. With the patient prone, the lower back was prepped with Betadine. 1% Lidocaine was used for local anesthesia. Lumbar puncture was performed at the L2-3 level using a 22 gauge needle with return of clear CSF. 12 mg of methotrexate was injected into the subarachnoid space. The patient tolerated the procedure well and there were no apparent complications. No immediate  complications. IMPRESSION: Successful fluoroscopic guided lumbar puncture for intrathecal chemotherapy. Electronically Signed   By: Kathreen Devoid   On: 09/28/2020 13:12   DG FLUORO GUIDED LOC OF NEEDLE/CATH TIP FOR SPINAL INJECT LT  Result Date: 09/07/2020 CLINICAL DATA:  Intrathecal methotrexate for high-grade B-cell lymphoma EXAM: DIAGNOSTIC LUMBAR PUNCTURE UNDER FLUOROSCOPIC GUIDANCE FLUOROSCOPY TIME:  Fluoroscopy Time:  0.2 minute Radiation Exposure Index (if provided by the fluoroscopic device): 0.2 mGy Number of Acquired Spot Images: 0 PROCEDURE: Informed consent was obtained from the patient prior to the procedure, including potential complications of headache, allergy, and pain. With the patient prone, the lower back was prepped with Betadine. 1% Lidocaine was used for local anesthesia. Lumbar puncture was performed at the L2-3 level using a 22 gauge needle with return of clear CSF. 12 mg of methotrexate was injected into the subarachnoid space. The patient tolerated the procedure well and there were no apparent complications. No immediate complications. IMPRESSION: Successful fluoroscopic guided lumbar puncture for intrathecal chemotherapy. Electronically Signed   By: Kathreen Devoid   On: 09/07/2020 12:07      ASSESSMENT & PLAN:  1. 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[place])   2. 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 IHC MYC+, BCL 6+, negative translocation.  Status post 4  cycles of dose adjusted EPOCH with rituximab.  Patient had +1 dose level increase for cycle 2/cycle 3, +2 dose level increase for cycle 4, +1 dose level for cycle 5 and 6. Intrathecal MTX Post treatment PET was reviewed and discussed with patient. - Reduced size and activity of the prior left axillary lymph node, currently Deauville 2 - Deauville 5  activity in a low-density collection adjacent to the right proximal biceps muscle - New Deauville 4 left inguinal lymph nodes  I will present his case on tumor board for image review and management plan discussion.   LDH has increased. Uric acid 5.7  # Port Cath in place. Port flush every 8 weeks.   #Grade 2 neuropathy, recommend patient to start gabapentin 100 mg 3 times daily.  Potential side effects were discussed with him.  # AIDS patient follows with ID physician.  Has been on antiretroviral therapy with Biktarvy .   Supportive care measures are necessary for patient well-being and will be provided as necessary. We spent sufficient time to discuss many aspect of care, questions were answered to patient and wife's satisfaction.     All questions were answered. The patient knows to call the clinic with any problems questions or concerns.  cc Nobie Putnam *  Follow-up in   To be determined.   Earlie Server, MD, PhD Hematology Oncology [facility] Pager- [phone] 12/05/2020

## 2020-12-08 ENCOUNTER — Other Ambulatory Visit: Payer: Medicare HMO

## 2020-12-08 ENCOUNTER — Telehealth: Payer: Self-pay

## 2020-12-08 DIAGNOSIS — C8510 Unspecified B-cell lymphoma, unspecified site: Secondary | ICD-10-CM

## 2020-12-08 NOTE — Telephone Encounter (Signed)
-----   Message from Earlie Server, MD sent at 12/08/2020  3:50 PM EDT ----- Please arrange him to do PET 10 weeks from now.  He sees me lab cbc cmp LDH uric acid in 10 weeks, prior to MD  thanks. He knows the plan.

## 2020-12-08 NOTE — Telephone Encounter (Signed)
[place], can you please schedule lab/PET in 10 weeks with MD a few days later. Thanks

## 2020-12-08 NOTE — Progress Notes (Signed)
Tumor Board Documentation  Justin Wallace was presented by Dr Tasia Catchings at our Tumor Board on 12/08/2020, which included representatives from medical oncology,radiation oncology,internal medicine,navigation,pathology,radiology,surgical,research,palliative care,pulmonology.  Justin Wallace currently presents as a current patient,for discussion with history of the following treatments: neoadjuvant chemotherapy.  Additionally, we reviewed previous medical and familial history, history of present illness, and recent lab results along with all available histopathologic and imaging studies. The tumor board considered available treatment options and made the following recommendations: Active surveillance    The following procedures/referrals were also placed: No orders of the defined types were placed in this encounter.   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  Tumor board is a meeting of clinicians from various specialty areas who evaluate and discuss patients for whom a multidisciplinary approach is being considered. Final determinations in the plan of care are those of the provider(s). The responsibility for follow up of recommendations given during tumor board is that of the provider.   Today's extended care, comprehensive team conference, Justin Wallace was not present for the discussion and was not examined.   Multidisciplinary Tumor Board is a multidisciplinary case peer review process.  Decisions discussed in the Multidisciplinary Tumor Board reflect the opinions of the specialists present at the conference without having examined the patient.  Ultimately, treatment and diagnostic decisions rest with the primary provider(s) and the patient.

## 2020-12-09 ENCOUNTER — Other Ambulatory Visit: Payer: Self-pay

## 2020-12-09 DIAGNOSIS — C8510 Unspecified B-cell lymphoma, unspecified site: Secondary | ICD-10-CM

## 2020-12-09 NOTE — Telephone Encounter (Signed)
Does one of you ladies put the PET in for me please?

## 2020-12-12 ENCOUNTER — Other Ambulatory Visit (HOSPITAL_COMMUNITY): Payer: Self-pay

## 2021-01-03 ENCOUNTER — Other Ambulatory Visit (HOSPITAL_COMMUNITY): Payer: Self-pay

## 2021-01-09 ENCOUNTER — Other Ambulatory Visit: Payer: Medicare HMO

## 2021-01-09 ENCOUNTER — Inpatient Hospital Stay: Payer: Medicare HMO | Attending: Oncology

## 2021-01-09 ENCOUNTER — Other Ambulatory Visit: Payer: Self-pay

## 2021-01-09 DIAGNOSIS — D61818 Other pancytopenia: Secondary | ICD-10-CM | POA: Diagnosis not present

## 2021-01-09 DIAGNOSIS — C8510 Unspecified B-cell lymphoma, unspecified site: Secondary | ICD-10-CM | POA: Diagnosis not present

## 2021-01-09 DIAGNOSIS — D709 Neutropenia, unspecified: Secondary | ICD-10-CM | POA: Diagnosis not present

## 2021-01-09 DIAGNOSIS — C8514 Unspecified B-cell lymphoma, lymph nodes of axilla and upper limb: Secondary | ICD-10-CM | POA: Insufficient documentation

## 2021-01-09 DIAGNOSIS — Z452 Encounter for adjustment and management of vascular access device: Secondary | ICD-10-CM | POA: Diagnosis not present

## 2021-01-09 DIAGNOSIS — B20 Human immunodeficiency virus [HIV] disease: Secondary | ICD-10-CM | POA: Diagnosis not present

## 2021-01-09 DIAGNOSIS — Z7185 Encounter for immunization safety counseling: Secondary | ICD-10-CM

## 2021-01-09 DIAGNOSIS — Z298 Encounter for other specified prophylactic measures: Secondary | ICD-10-CM

## 2021-01-09 DIAGNOSIS — R5081 Fever presenting with conditions classified elsewhere: Secondary | ICD-10-CM | POA: Diagnosis not present

## 2021-01-09 DIAGNOSIS — Z95828 Presence of other vascular implants and grafts: Secondary | ICD-10-CM

## 2021-01-09 MED ORDER — SODIUM CHLORIDE 0.9% FLUSH
10.0000 mL | INTRAVENOUS | Status: DC | PRN
  Administered 2021-01-09: 10 mL via INTRAVENOUS
  Filled 2021-01-09: qty 10

## 2021-01-09 MED ORDER — HEPARIN SOD (PORK) LOCK FLUSH 100 UNIT/ML IV SOLN
500.0000 [IU] | Freq: Once | INTRAVENOUS | Status: AC
  Administered 2021-01-09: 500 [IU] via INTRAVENOUS
  Filled 2021-01-09: qty 5

## 2021-01-09 MED ORDER — HEPARIN SOD (PORK) LOCK FLUSH 100 UNIT/ML IV SOLN
INTRAVENOUS | Status: AC
  Filled 2021-01-09: qty 5

## 2021-01-10 LAB — T-HELPER CELL (CD4) - (RCID CLINIC ONLY)
CD4 % Helper T Cell: 21 % — ABNORMAL LOW (ref 33–65)
CD4 T Cell Abs: 193 /uL — ABNORMAL LOW (ref 400–1790)

## 2021-01-11 ENCOUNTER — Encounter: Payer: Self-pay | Admitting: Oncology

## 2021-01-12 ENCOUNTER — Encounter: Payer: Self-pay | Admitting: Oncology

## 2021-01-12 LAB — CBC
HCT: 36.1 % — ABNORMAL LOW (ref 38.5–50.0)
Hemoglobin: 11.9 g/dL — ABNORMAL LOW (ref 13.2–17.1)
MCH: 31.2 pg (ref 27.0–33.0)
MCHC: 33 g/dL (ref 32.0–36.0)
MCV: 94.8 fL (ref 80.0–100.0)
MPV: 9.5 fL (ref 7.5–12.5)
Platelets: 178 10*3/uL (ref 140–400)
RBC: 3.81 10*6/uL — ABNORMAL LOW (ref 4.20–5.80)
RDW: 13.1 % (ref 11.0–15.0)
WBC: 3.3 10*3/uL — ABNORMAL LOW (ref 3.8–10.8)

## 2021-01-12 LAB — COMPREHENSIVE METABOLIC PANEL
AG Ratio: 1.5 (calc) (ref 1.0–2.5)
ALT: 11 U/L (ref 9–46)
AST: 18 U/L (ref 10–35)
Albumin: 3.9 g/dL (ref 3.6–5.1)
Alkaline phosphatase (APISO): 79 U/L (ref 35–144)
BUN: 21 mg/dL (ref 7–25)
CO2: 27 mmol/L (ref 20–32)
Calcium: 9 mg/dL (ref 8.6–10.3)
Chloride: 107 mmol/L (ref 98–110)
Creat: 1.04 mg/dL (ref 0.70–1.18)
Globulin: 2.6 g/dL (calc) (ref 1.9–3.7)
Glucose, Bld: 90 mg/dL (ref 65–99)
Potassium: 4.1 mmol/L (ref 3.5–5.3)
Sodium: 140 mmol/L (ref 135–146)
Total Bilirubin: 0.3 mg/dL (ref 0.2–1.2)
Total Protein: 6.5 g/dL (ref 6.1–8.1)

## 2021-01-12 LAB — HIV-1 RNA QUANT-NO REFLEX-BLD
HIV 1 RNA Quant: 75 Copies/mL — ABNORMAL HIGH
HIV-1 RNA Quant, Log: 1.87 Log cps/mL — ABNORMAL HIGH

## 2021-01-18 ENCOUNTER — Other Ambulatory Visit (HOSPITAL_COMMUNITY): Payer: Self-pay

## 2021-01-18 ENCOUNTER — Other Ambulatory Visit: Payer: Self-pay | Admitting: *Deleted

## 2021-01-19 ENCOUNTER — Encounter: Payer: Self-pay | Admitting: Oncology

## 2021-01-19 MED ORDER — GABAPENTIN 100 MG PO CAPS: ORAL_CAPSULE | ORAL | 1 refills | Status: DC

## 2021-01-23 ENCOUNTER — Ambulatory Visit (INDEPENDENT_AMBULATORY_CARE_PROVIDER_SITE_OTHER): Payer: Medicare HMO | Admitting: Internal Medicine

## 2021-01-23 ENCOUNTER — Encounter: Payer: Self-pay | Admitting: Internal Medicine

## 2021-01-23 ENCOUNTER — Other Ambulatory Visit: Payer: Self-pay

## 2021-01-23 VITALS — Resp 16 | Ht 69.0 in | Wt 168.0 lb

## 2021-01-23 DIAGNOSIS — C8510 Unspecified B-cell lymphoma, unspecified site: Secondary | ICD-10-CM | POA: Diagnosis not present

## 2021-01-23 DIAGNOSIS — B20 Human immunodeficiency virus [HIV] disease: Secondary | ICD-10-CM

## 2021-01-23 DIAGNOSIS — Z298 Encounter for other specified prophylactic measures: Secondary | ICD-10-CM

## 2021-01-23 DIAGNOSIS — Z2989 Encounter for other specified prophylactic measures: Secondary | ICD-10-CM

## 2021-01-23 NOTE — Assessment & Plan Note (Signed)
Will continue TMP SMX 1 DS tablet daily for PCP prophylaxis.  Labs are stable and will repeat again in 3 months.

## 2021-01-23 NOTE — Patient Instructions (Signed)
Thank you for coming to see me today. It was a pleasure seeing you.  To Do: Labs in 3 months Office visit in 6 months  If you have any questions or concerns, please do not hesitate to call the office at [phone].  Take Care,   Jule Ser

## 2021-01-23 NOTE — Assessment & Plan Note (Signed)
Justin Wallace continues to do well on Ronceverte without any side effects.  He reports 100% adherence.  He has shown some evidence of immune reconstitution with improved CD4 count and his viral load is stable at 75 copies.  He does take a multivitamin and we discussed separating this from his Biktarvy to decrease medication interactions.  Will repeat labs in 3 months and office visit in 6 months, but sooner if needed.  Will continue Biktarvy for now.

## 2021-01-23 NOTE — Progress Notes (Signed)
[facility]   CHIEF COMPLAINT    HIV follow up.    SUBJECTIVE:    Justin Wallace is a 75 y.o. male with PMHx as below who presents to the clinic for HIV follow up.   Justin Wallace is in for his routine HIV follow-up visit.  He was previously seen by me on October 24, 2020.  In that interim he was seen by his oncologist (Dr. Tasia Catchings) on May 9 and he had a posttreatment PET scan done prior to that on May 5.  His results were discussed at the tumor board on May 12 who recommended ongoing active surveillance.  He will have PET scan and labs obtained in late July with a follow-up visit with his oncologist after that.  He continues to take Gilbert daily without any noted side effects.  He also continues on TMP-SMX for PCP prophylaxis.  He obtained routine follow-up labs on June 13 which showed normal CMP, CBC stable, CD4 count 193 (21%) and HIV viral load of 75 copies.   Please see A&P for the details of today's visit and status of the patient's medical problems.   Patient's Medications  New Prescriptions   No medications on file  Previous Medications   BICTEGRAVIR-EMTRICITABINE-TENOFOVIR AF (BIKTARVY) 50-200-25 MG TABS TABLET    TAKE 1 TABLET BY MOUTH DAILY.   SULFAMETHOXAZOLE-TRIMETHOPRIM (BACTRIM DS) 800-160 MG TABLET    Take 1 tablet by mouth daily.  Modified Medications   No medications on file  Discontinued Medications   ACYCLOVIR (ZOVIRAX) 400 MG TABLET    Take 1 tablet (400 mg total) by mouth 2 (two) times daily.   CHLORHEXIDINE (PERIDEX) 0.12 % SOLUTION    Use as directed 15 mLs in the mouth or throat 2 (two) times daily.   GABAPENTIN (NEURONTIN) 100 MG CAPSULE    TAKE 1 CAPSULE(100 MG) BY MOUTH THREE TIMES DAILY   HYDROCORTISONE (ANUSOL-HC) 2.5 % RECTAL CREAM    Place 1 application rectally 2 (two) times daily. For up to 1-2 weeks. As needed hemorrhoids   LIDOCAINE-PRILOCAINE (EMLA) CREAM    Apply to affected area once   MAGIC MOUTHWASH W/LIDOCAINE SOLN    Take 5  mLs by mouth 4 (four) times daily as needed for mouth pain. Sig: Swish/spit 5-10 ml four times a day as needed   ONDANSETRON (ZOFRAN) 8 MG TABLET    Take 1 tablet (8 mg total) by mouth 2 (two) times daily as needed for refractory nausea / vomiting. Start on the third day after chemotherapy.   POLYETHYLENE GLYCOL POWDER (GLYCOLAX/MIRALAX) 17 GM/SCOOP POWDER    Take 17 g by mouth daily as needed for moderate constipation or severe constipation.   PREDNISONE (DELTASONE) 50 MG TABLET    Take 2 tablets on Day 2, 3, 4, 5 of each chemotherapy treatments.      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  Respiratory: Negative.    Cardiovascular: Negative.   Gastrointestinal: Negative.   Musculoskeletal: Negative.     OBJECTIVE:    Vitals:   01/23/21 1023  Resp: 16  Weight: 168 lb (76.2 kg)  Height: 5' 9"  (1.753 m)     Body mass index is 24.81 kg/m.  Physical Exam Constitutional:      General: He is not in acute distress.    Appearance: Normal appearance.  HENT:     Head: Normocephalic and atraumatic.  Eyes:     Extraocular Movements: Extraocular movements intact.     Conjunctiva/sclera: Conjunctivae normal.  Pulmonary:     Effort: Pulmonary effort is normal. No respiratory distress.  Musculoskeletal:        General: Normal range of motion.  Skin:    General: Skin is warm and dry.     Coloration: Skin is not jaundiced.      Findings: No rash.  Neurological:     General: No focal deficit present.     Mental Status: He is alert and oriented to person, place, and time.  Psychiatric:        Mood and Affect: Mood normal.        Behavior: Behavior normal.    Labs and Microbiology: CMP Latest Ref Rng & Units 01/09/2021 12/05/2020 10/10/2020  Glucose 65 - 99 mg/dL 90 126(H) 114(H)  BUN 7 - 25 mg/dL 21 24(H) 13  Creatinine 0.70 - 1.18 mg/dL 1.04 1.06 0.82  Sodium 135 - 146 mmol/L 140 137 142  Potassium 3.5 - 5.3 mmol/L 4.1 3.9 4.0  Chloride 98 - 110 mmol/L 107 105 108  CO2 20 - 32 mmol/L 27 23 27   Calcium 8.6 - 10.3 mg/dL 9.0 9.0 8.6  Total Protein 6.1 - 8.1 g/dL 6.5 6.7 5.6(L)  Total Bilirubin 0.2 - 1.2 mg/dL 0.3 0.7 0.3  Alkaline Phos 38 - 126 U/L - 76 -  AST 10 - 35 U/L 18 26 18   ALT 9 - 46 U/L 11 13 18    CBC Latest Ref Rng & Units 01/09/2021 12/05/2020 10/24/2020  WBC 3.8 - 10.8 Thousand/uL 3.3(L) 4.3 4.6  Hemoglobin 13.2 - 17.1 g/dL 11.9(L) 11.4(L) 10.6(L)  Hematocrit 38.5 - 50.0 % 36.1(L) 34.0(L) 32.3(L)  Platelets 140 - 400 Thousand/uL 178 142(L) 196     Lab Results  Component Value Date   HIV1RNAQUANT 75 (H) 01/09/2021   HIV1RNAQUANT 74 (H) 10/10/2020   HIV1RNAQUANT 70 10/05/2020   CD4TABS 193 (L) 01/09/2021   CD4TABS 38 (L) 10/10/2020   CD4TABS 223 (L) 07/19/2020    RPR and STI: Lab Results  Component Value Date   LABRPR NON-REACTIVE 05/05/2020    STI Results GC CT  05/05/2020 Negative Negative    Hepatitis B: Lab Results  Component Value Date   HEPBSAB NON-REACTIVE 05/05/2020   HEPBSAG NON-REACTIVE 05/05/2020   HEPBCAB NON-REACTIVE 05/05/2020   Hepatitis C: Lab Results  Component Value Date   HEPCAB NON-REACTIVE 05/05/2020   Hepatitis A: Lab Results  Component Value Date   HAV REACTIVE (A) 05/05/2020   Lipids: Lab Results  Component Value Date   CHOL 158 05/05/2020   TRIG 92 05/05/2020   HDL 34 (L) 05/05/2020   CHOLHDL 4.6 05/05/2020   LDLCALC 105 (H) 05/05/2020       ASSESSMENT & PLAN:    HIV disease ([place]) Justin Wallace continues to do well on Biktarvy without any side effects.  He reports 100% adherence.  He has shown some evidence of immune reconstitution with improved CD4 count and his viral load is stable at 75 copies.  He does take a multivitamin and we discussed separating this from his Biktarvy to decrease medication interactions.  Will repeat labs in 3 months and office visit in 6 months, but sooner if needed.  Will continue Biktarvy for now.   Need for pneumocystis prophylaxis Will continue TMP SMX 1 DS tablet daily for PCP prophylaxis.  Labs are stable and will repeat again in 3 months.    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   Orders Placed This Encounter  Procedures   T-helper cell (CD4)- ([facility] clinic only)    Standing Status:   Future    Standing Expiration Date:   01/23/2022   HIV-1 RNA quant-no reflex-bld    3 month viral load    Standing Status:   Future    Standing Expiration Date:   01/23/2022   CBC    Standing Status:   Future    Standing Expiration Date:   01/23/2022   Comprehensive metabolic panel    Standing Status:   Future    Standing Expiration Date:   01/23/2022      Justin Wallace for Infectious Disease [facility] 01/23/2021, 10:55 AM

## 2021-01-23 NOTE — Assessment & Plan Note (Signed)
Continues to follow up with heme/onc.

## 2021-01-26 ENCOUNTER — Other Ambulatory Visit: Payer: Medicare HMO

## 2021-01-31 ENCOUNTER — Ambulatory Visit: Payer: Medicare HMO

## 2021-02-10 ENCOUNTER — Other Ambulatory Visit: Payer: Self-pay | Admitting: Internal Medicine

## 2021-02-10 ENCOUNTER — Other Ambulatory Visit (HOSPITAL_COMMUNITY): Payer: Self-pay

## 2021-02-10 MED ORDER — BIKTARVY 50-200-25 MG PO TABS
1.0000 | ORAL_TABLET | Freq: Every day | ORAL | 2 refills | Status: DC
  Filled 2021-02-10: qty 30, 30d supply, fill #0
  Filled 2021-03-20: qty 30, 30d supply, fill #1
  Filled 2021-04-11: qty 30, 30d supply, fill #2

## 2021-02-13 ENCOUNTER — Other Ambulatory Visit (HOSPITAL_COMMUNITY): Payer: Self-pay

## 2021-02-15 ENCOUNTER — Other Ambulatory Visit: Payer: Self-pay

## 2021-02-17 ENCOUNTER — Inpatient Hospital Stay: Payer: Medicare HMO | Attending: Oncology

## 2021-02-17 ENCOUNTER — Other Ambulatory Visit: Payer: Self-pay

## 2021-02-17 DIAGNOSIS — G620 Drug-induced polyneuropathy: Secondary | ICD-10-CM | POA: Insufficient documentation

## 2021-02-17 DIAGNOSIS — C8510 Unspecified B-cell lymphoma, unspecified site: Secondary | ICD-10-CM | POA: Insufficient documentation

## 2021-02-17 DIAGNOSIS — B20 Human immunodeficiency virus [HIV] disease: Secondary | ICD-10-CM | POA: Diagnosis not present

## 2021-02-17 LAB — CBC WITH DIFFERENTIAL/PLATELET
Abs Immature Granulocytes: 0.06 10*3/uL (ref 0.00–0.07)
Basophils Absolute: 0 10*3/uL (ref 0.0–0.1)
Basophils Relative: 1 %
Eosinophils Absolute: 0.1 10*3/uL (ref 0.0–0.5)
Eosinophils Relative: 2 %
HCT: 35.7 % — ABNORMAL LOW (ref 39.0–52.0)
Hemoglobin: 12 g/dL — ABNORMAL LOW (ref 13.0–17.0)
Immature Granulocytes: 1 %
Lymphocytes Relative: 38 %
Lymphs Abs: 1.8 10*3/uL (ref 0.7–4.0)
MCH: 31 pg (ref 26.0–34.0)
MCHC: 33.6 g/dL (ref 30.0–36.0)
MCV: 92.2 fL (ref 80.0–100.0)
Monocytes Absolute: 0.6 10*3/uL (ref 0.1–1.0)
Monocytes Relative: 12 %
Neutro Abs: 2.2 10*3/uL (ref 1.7–7.7)
Neutrophils Relative %: 46 %
Platelets: 178 10*3/uL (ref 150–400)
RBC: 3.87 MIL/uL — ABNORMAL LOW (ref 4.22–5.81)
RDW: 13.8 % (ref 11.5–15.5)
WBC: 4.7 10*3/uL (ref 4.0–10.5)
nRBC: 0 % (ref 0.0–0.2)

## 2021-02-17 LAB — COMPREHENSIVE METABOLIC PANEL
ALT: 17 U/L (ref 0–44)
AST: 29 U/L (ref 15–41)
Albumin: 3.8 g/dL (ref 3.5–5.0)
Alkaline Phosphatase: 80 U/L (ref 38–126)
Anion gap: 6 (ref 5–15)
BUN: 30 mg/dL — ABNORMAL HIGH (ref 8–23)
CO2: 23 mmol/L (ref 22–32)
Calcium: 8.7 mg/dL — ABNORMAL LOW (ref 8.9–10.3)
Chloride: 109 mmol/L (ref 98–111)
Creatinine, Ser: 1.07 mg/dL (ref 0.61–1.24)
GFR, Estimated: >60 mL/min (ref >60)
Glucose, Bld: 91 mg/dL (ref 70–99)
Potassium: 3.8 mmol/L (ref 3.5–5.1)
Sodium: 138 mmol/L (ref 135–145)
Total Bilirubin: 0.5 mg/dL (ref 0.3–1.2)
Total Protein: 6.8 g/dL (ref 6.5–8.1)

## 2021-02-17 LAB — URIC ACID: Uric Acid, Serum: 6 mg/dL (ref 3.7–8.6)

## 2021-02-17 LAB — LACTATE DEHYDROGENASE: LDH: 194 U/L — ABNORMAL HIGH (ref 98–192)

## 2021-02-20 ENCOUNTER — Other Ambulatory Visit: Payer: Self-pay

## 2021-02-20 ENCOUNTER — Ambulatory Visit
Admission: RE | Admit: 2021-02-20 | Discharge: 2021-02-20 | Disposition: A | Discharge status: Home / Self Care | Payer: Medicare HMO | Source: Ambulatory Visit | Attending: Oncology | Admitting: Oncology

## 2021-02-20 DIAGNOSIS — C8510 Unspecified B-cell lymphoma, unspecified site: Secondary | ICD-10-CM | POA: Diagnosis not present

## 2021-02-20 DIAGNOSIS — C8330 Diffuse large B-cell lymphoma, unspecified site: Secondary | ICD-10-CM | POA: Diagnosis not present

## 2021-02-20 LAB — GLUCOSE, CAPILLARY: Glucose-Capillary: 86 mg/dL (ref 70–99)

## 2021-02-20 MED ORDER — FLUDEOXYGLUCOSE F - 18 (FDG) INJECTION
8.7000 | Freq: Once | INTRAVENOUS | Status: AC | PRN
  Administered 2021-02-20: 9 via INTRAVENOUS

## 2021-02-21 ENCOUNTER — Encounter: Payer: Self-pay | Admitting: Oncology

## 2021-02-21 ENCOUNTER — Inpatient Hospital Stay (HOSPITAL_BASED_OUTPATIENT_CLINIC_OR_DEPARTMENT_OTHER): Payer: Medicare HMO | Admitting: Oncology

## 2021-02-21 VITALS — BP 103/64 | HR 60 | Temp 97.8°F | Resp 16 | Ht 69.0 in | Wt 166.8 lb

## 2021-02-21 DIAGNOSIS — G620 Drug-induced polyneuropathy: Secondary | ICD-10-CM | POA: Diagnosis not present

## 2021-02-21 DIAGNOSIS — B20 Human immunodeficiency virus [HIV] disease: Secondary | ICD-10-CM | POA: Diagnosis not present

## 2021-02-21 DIAGNOSIS — T451X5A Adverse effect of antineoplastic and immunosuppressive drugs, initial encounter: Secondary | ICD-10-CM

## 2021-02-21 DIAGNOSIS — Z95828 Presence of other vascular implants and grafts: Secondary | ICD-10-CM

## 2021-02-21 DIAGNOSIS — C8510 Unspecified B-cell lymphoma, unspecified site: Secondary | ICD-10-CM

## 2021-02-21 NOTE — Progress Notes (Signed)
Hematology/Oncology follow up note [facility] [phone] [phone]   Patient Care Team: Olin Hauser, DO as PCP - General (Family Medicine) Earlie Server, MD as Consulting Physician (Hematology and Oncology)  REFERRING PROVIDER: Nobie Putnam *  CHIEF COMPLAINTS/REASON FOR VISIT:  Follow up for HIV related high-grade B-cell lymphoma   HISTORY OF PRESENTING ILLNESS:   Justin Wallace is a  75 y.o.  male with PMH listed below was seen in consultation at the request of  Nobie Putnam *  for evaluation of symptomatic anemia Patient reported history of sudden onset of diffuse body aches, decreased appetite, headache low-grade fever, profound weakness/fatigue about 2 weeks ago.  Prior to the onset of symptoms, he went to a funeral as well as applicable clinic.  He reports that he has received COVID-19 vaccination previously.  He moved from [place].  Daughter is an Therapist, sports and told him to get COVID-19 PCR checked and he was tested negative. Patient also reports a sudden drop of weight since the onset of symptoms.  Patient has a history of celiac disease.  No colonoscopy records or pathology records in current EMR.  Diagnosis was done by [facility] gastroenterology.  Patient reports that he was in his usual state of health until the onset of symptoms.  03/10/2020, patient was evaluated by primary care provider.  Blood work showed mild anemia with hemoglobin of 13.1, Iron panel showed decreased saturation of 8, ferritin 218, TIBC 291, patient was referred to hematology for evaluation of symptomatic anemia and iron deficiency.  Patient reports that her body aches, fever and headache symptoms have improved.  However he continues to feel very tired and fatigued.  Appetite has decreased and weight remains low.  # Physical examination showed left axillary mass  ultrasound confirmed a left axillary large soft tissue mass up to 6.8 cm with  internal vascularity. 03/30/2020 CT chest with contrast showed soft tissue mass/enlarged lymph node in the left axilla 6.3 cm, concerning for primary mass or metastatic lymphadenopathy.  There are additional prominent although much smaller right axillary lymph nodes, pretracheal and superior mediastinal lymph nodes.  Findings are concerning for malignancy such as lymphoma or nodal metastatic disease.  # treated for Lymes disease, finished doxycycline 1106m BID for 21 days course. 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 he has started treatment with Biktarvy, also started on Bactrim daily.  04/26/2020 PET scan showed large intensely hypermetabolic left axillary mass SUV 28.  Additional small bilateral axilla lymph node with mild metabolic activity SUV 2.  Metabolic activity through the porta hepatis region without enlarged nodes identified.  No bone lesions. 05/02/2020 bone marrow biopsy showed hypercellular marrow for age with trilineage hematopoiesis.  Increased number of CD10 positive B cells, cannot rule out minimal or subtle involvement of marrow by the high-grade B-cell lymphoma. Normal cytogenetics. 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  Positive for BCL6 and MYC expression.  FISH testing showed negative for BCL-2/BCL6/MYC gene rearrangement.   Trisomy 3 and 18 were identified and are nonspecific findings seen in clonal lymphoid neoplasm.  Final results were signed out on 05/30/2020-due to the delay of  FISH results.  Bsed on the FSapulparesults, lymphoma is best classified as high-grade B-cell lymphoma not otherwise specified.  It does not meet morphological criteria for diffuse large B-cell lymphoma nor molecular criteria for Burkitt lymphoma.  11 q. alteration testing has been sent.  Results are pending. 04/26/2020, MUGA  testing showed normal LVEF of 56% with normal LV wall motion.   05/13/2020, echocardiogram showed low normal  end ejection fraction of 50 to 55%.  #04/19/2020 Mediport placed by Dr. Lucky Cowboy # Chemotherapy # 06/06/2020 cycle 1 R-EPOCH +GCSF                    Intrathecal MTX # 06/27/2020 cycle 2 R- EPOCH +GCSF [+ 1 level]  Intrathecal MTX # 07/18/2020 cycle 3 R -EPOCH +GCSF [+ 1 level]  Intrathecal MTX # 08/03/2020 PET negative. Deuville 3 # 08/08/2020 cycle 4 R EPOCH +GCSF  [+ 2 level]  Intrathecal MTX- did not tolerate due to cytopenia and mucositis # 08/29/2020 cycle 5 R -EPOCH +GCSF [+ 1 level]  Intrathecal MTX # 09/19/2020 cycle 6 R -EPOCH +GCSF [+ 1 level]  Intrathecal MTX  INTERVAL HISTORY Justin Wallace is a 75 y.o. male who has above history reviewed by me today presents for follow up visit for high-grade B-cell lymphoma Problems and complaints are listed below: Accompanied by wife. He reports feeling well.  Denies any recent injury, joint pain, dysuria, abdominal pain, fever chills, wight loss, night sweats.    Review of Systems  Constitutional:  Negative for appetite change, chills, fever and unexpected weight change.  HENT:   Negative for hearing loss, mouth sores and voice change.   Eyes:  Negative for eye problems and icterus.  Respiratory:  Negative for chest tightness, cough and shortness of breath.   Cardiovascular:  Negative for chest pain and leg swelling.  Gastrointestinal:  Negative for abdominal distention and abdominal pain.  Endocrine: Negative for hot flashes.  Genitourinary:  Negative for difficulty urinating, dysuria and frequency.   Musculoskeletal:  Negative for arthralgias.  Skin:  Negative for itching and rash.  Neurological:  Negative for light-headedness and numbness.  Hematological:  Negative for adenopathy. Does not bruise/bleed easily.  Psychiatric/Behavioral:  Negative for confusion.    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[place]) 05/27/2020   HIV (human immunodeficiency virus infection) ([place])    Lyme  disease    Lymphoma ([place])     SURGICAL HISTORY: Past Surgical History:  Procedure Laterality Date   PORTA CATH INSERTION N/A 04/25/2020   Procedure: PORTA CATH INSERTION;  Surgeon: Algernon Huxley, MD;  Location: [place];  Service: Cardiovascular;  Laterality: N/A;    SOCIAL HISTORY: Social History   Socioeconomic History   Marital status: Married    Spouse name: Opal Sidles    Number of children: 2   Years of education: Not on file   Highest education level: Not on file  Occupational History   Not on file  Tobacco Use   Smoking status: Never   Smokeless tobacco: Never  Vaping Use   Vaping Use: Never used  Substance and Sexual Activity   Alcohol use: Yes    Comment: occasional   Drug use: Never   Sexual activity: Never  Other Topics Concern   Not on file  Social History Narrative   Not on file   Social Determinants of Health   Financial Resource Strain: Not on file  Food Insecurity: Not on file  Transportation Needs: Not on file  Physical Activity: Not on file  Stress: Not on file  Social Connections: Not on file  Intimate Partner Violence: Not on file    FAMILY HISTORY: Family History  Problem Relation Age of Onset   Hyperlipidemia  Mother    Brain cancer Father     ALLERGIES:  has No Known Allergies.  MEDICATIONS:  Current Outpatient Medications  Medication Sig Dispense Refill   bictegravir-emtricitabine-tenofovir AF (BIKTARVY) 50-200-25 MG TABS tablet TAKE 1 TABLET BY MOUTH DAILY. 30 tablet 2   sulfamethoxazole-trimethoprim (BACTRIM DS) 800-160 MG tablet Take 1 tablet by mouth daily.     No current facility-administered medications for this visit.     PHYSICAL EXAMINATION: ECOG PERFORMANCE STATUS: 1 - Symptomatic but completely ambulatory Vitals:   02/21/21 1338  BP: 103/64  Pulse: 60  Resp: 16  Temp: 97.8 F (36.6 C)  SpO2: 99%   Filed Weights   02/21/21 1338  Weight: 166 lb 12.8 oz (75.7 kg)    Physical Exam Constitutional:       General: He is not in acute distress. HENT:     Head: Normocephalic and atraumatic.  Eyes:     General: No scleral icterus. Cardiovascular:     Rate and Rhythm: Normal rate and regular rhythm.     Heart sounds: Normal heart sounds.  Pulmonary:     Effort: Pulmonary effort is normal. No respiratory distress.     Breath sounds: No wheezing.  Abdominal:     General: Bowel sounds are normal. There is no distension.     Palpations: Abdomen is soft.  Musculoskeletal:        General: No deformity. Normal range of motion.     Cervical back: Normal range of motion and neck supple.     Comments: Left axillary mass is no longer palpable  Skin:    General: Skin is warm and dry.     Findings: No erythema or rash.  Neurological:     Mental Status: He is alert and oriented to person, place, and time. Mental status is at baseline.     Cranial Nerves: No cranial nerve deficit.     Coordination: Coordination normal.  Psychiatric:        Mood and Affect: Mood normal.      LABORATORY DATA:  I have reviewed the data as listed Lab Results  Component Value Date   WBC 4.7 02/17/2021   HGB 12.0 (L) 02/17/2021   HCT 35.7 (L) 02/17/2021   MCV 92.2 02/17/2021   PLT 178 02/17/2021   Recent Labs    03/17/20 1029 05/05/20 0956 06/06/20 0822 10/06/20 0410 10/07/20 0630 10/08/20 0426 10/10/20 1018 12/05/20 1434 01/09/21 1044 02/17/21 1501  NA 137 139   < > 136 138 140 142 137 140 138  K 3.8 4.4   < > 3.5 3.1* 4.1 4.0 3.9 4.1 3.8  CL 104 107   < > 108 110 113* 108 105 107 109  CO2 27 27   < > 21* 21* _0 GLUCOSE 97 97   < > 100* 95 101* 114* 126* 90 91  BUN 21 19   < > 25* _1 24* 21 30*  CREATININE 1.09 0.88   < > 0.87 0.80 0.69 0.82 1.06 1.04 1.07  CALCIUM 8.1* 9.1   < > 8.3* 8.3* 8.5* 8.6 9.0 9.0 8.7*  GFRNONAA >60 86   < > >60 >60 >60 88 >60  --  >60  GFRAA >60 100  --   --   --   --  102  --   --   --   PROT 8.0 7.4   < > 5.7* 5.2* 5.6* 5.6* 6.7 6.5 6.8  ALBUMIN  2.7*  --    < > 2.8* 2.7* 2.8*  --  3.8  --  3.8  AST 24 15   < > _0 ALT 18 8*   < > _1 ALKPHOS 140*  --    < > 52 55 66  --  76  --  80  BILITOT 0.3 0.5   < > 1.1 0.5 0.4 0.3 0.7 0.3 0.5  BILIDIR  --   --   --  0.2 <0.1 <0.1  --   --   --   --   IBILI  --   --   --  0.9 NOT CALCULATED NOT CALCULATED  --   --   --   --    < > = values in this interval not displayed.    Iron/TIBC/Ferritin/ %Sat    Component Value Date/Time   IRON 22 (L) 03/17/2020 1029   IRON 17 02/17/2018 0000   TIBC 286 03/17/2020 1029   TIBC 425 02/17/2018 0000   FERRITIN 284 03/17/2020 1029   IRONPCTSAT 8 (L) 03/17/2020 1029   IRONPCTSAT 8 (L) 03/10/2020 1113      RADIOGRAPHIC STUDIES: I have personally reviewed the radiological images as listed and agreed with the findings in the report. NM PET Image Restag (PS) Skull Base To Thigh  Result Date: 02/20/2021 CLINICAL DATA:  Subsequent treatment strategy for high-grade B-cell lymphoma. EXAM: NUCLEAR MEDICINE PET SKULL BASE TO THIGH TECHNIQUE: Nine mCi F-18 FDG was injected intravenously. Full-ring PET imaging was performed from the skull base to thigh after the radiotracer. CT data was obtained and used for attenuation correction and anatomic localization. Fasting blood glucose: 88 mg/dl COMPARISON:  Multiple priors including most recent PET-CT Dec 01, 2020 FINDINGS: Mediastinal blood pool activity: SUV max 1.99 Liver activity: SUV max 2.65 NECK: No significant abnormal hypermetabolic activity in this region. Incidental CT findings: Chronic left maxillary sinusitis. Dental hardware. CHEST: Peripherally hypermetabolic hypodense lesion adjacent to the right axillary neurovascular bundle and tracking adjacent to the right biceps muscle now measures 3.4 x 1.6 cm previously 3.0 x 1.2 cm with a max SUV of 4.36 previously 7.6, likely reflecting local bursitis/synovitis. Previously indexed left axillary lymph node now measures 7 mm in short  axis on image 90/3, previously 9 mm with minimal metabolic activity now with a max SUV of 1.01 previously 1.5. No new pathologically enlarged or suspicious hypermetabolic thoracic lymph nodes visualized. Incidental CT findings: Right chest Port-A-Cath with tip in the right atrium. Aortic and branch vessel atherosclerosis without aneurysmal dilation. Coronary artery calcifications. Hypoventilatory change in the dependent lungs. No suspicious pulmonary nodules or masses. ABDOMEN/PELVIS: No significant residual abnormal metabolic activity associated with the subcentimeter left inguinal lymph nodes for instance the previously indexed left inguinal lymph node now measures 3 mm in short axis on image 260/3 with a max SUV of 0.8. No new pathologically enlarged or suspicious hypermetabolic abdominal or pelvic lymph nodes visualized. Linear band of cutaneous hypermetabolic activity in the right anterior abdominal wall with some associated skin thickening and max SUV of 1.5 is favored inflammatory. Correlation with direct visualization suggested. Incidental CT findings: Normal size spleen. Stable hypodense 11 mm segment III hepatic lesion, which is without abnormal FDG avidity and favored benign. Left renal cysts which are not hypermetabolic 1 of which demonstrates a thin internal septation, stable dating back to at least April 12, 2020. SKELETON: No suspicious hypermetabolic osseous activity.  Incidental CT findings: Remote left sixth anterior rib fracture. 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 IMPRESSION: Continued decrease in size and abnormal FDG activity in the index left axillary lymph node with interval decrease in size and near complete resolution of the abnormal FDG avidity in the left inguinal lymph nodes, both of which demonstrate metabolic activity less than that of mediastinal blood pool. No new pathologically enlarged or abnormally hypermetabolic lymph nodes above or below the diaphragm.  Deauville 2. Decreased hypermetabolic activity associated with the low-density fluid collection along the right proximal bicep muscle, favored to represent synovitis/bursitis. Deauville X. Electronically Signed   By: Dahlia Bailiff MD   On: 02/20/2021 13:01    NM PET Image Restag (PS) Skull Base To Thigh  Result Date: 02/20/2021 CLINICAL DATA:  Subsequent treatment strategy for high-grade B-cell lymphoma. EXAM: NUCLEAR MEDICINE PET SKULL BASE TO THIGH TECHNIQUE: Nine mCi F-18 FDG was injected intravenously. Full-ring PET imaging was performed from the skull base to thigh after the radiotracer. CT data was obtained and used for attenuation correction and anatomic localization. Fasting blood glucose: 88 mg/dl COMPARISON:  Multiple priors including most recent PET-CT Dec 01, 2020 FINDINGS: Mediastinal blood pool activity: SUV max 1.99 Liver activity: SUV max 2.65 NECK: No significant abnormal hypermetabolic activity in this region. Incidental CT findings: Chronic left maxillary sinusitis. Dental hardware. CHEST: Peripherally hypermetabolic hypodense lesion adjacent to the right axillary neurovascular bundle and tracking adjacent to the right biceps muscle now measures 3.4 x 1.6 cm previously 3.0 x 1.2 cm with a max SUV of 4.36 previously 7.6, likely reflecting local bursitis/synovitis. Previously indexed left axillary lymph node now measures 7 mm in short axis on image 90/3, previously 9 mm with minimal metabolic activity now with a max SUV of 1.01 previously 1.5. No new pathologically enlarged or suspicious hypermetabolic thoracic lymph nodes visualized. Incidental CT findings: Right chest Port-A-Cath with tip in the right atrium. Aortic and branch vessel atherosclerosis without aneurysmal dilation. Coronary artery calcifications. Hypoventilatory change in the dependent lungs. No suspicious pulmonary nodules or masses. ABDOMEN/PELVIS: No significant residual abnormal metabolic activity associated with the  subcentimeter left inguinal lymph nodes for instance the previously indexed left inguinal lymph node now measures 3 mm in short axis on image 260/3 with a max SUV of 0.8. No new pathologically enlarged or suspicious hypermetabolic abdominal or pelvic lymph nodes visualized. Linear band of cutaneous hypermetabolic activity in the right anterior abdominal wall with some associated skin thickening and max SUV of 1.5 is favored inflammatory. Correlation with direct visualization suggested. Incidental CT findings: Normal size spleen. Stable hypodense 11 mm segment III hepatic lesion, which is without abnormal FDG avidity and favored benign. Left renal cysts which are not hypermetabolic 1 of which demonstrates a thin internal septation, stable dating back to at least April 12, 2020. SKELETON: No suspicious hypermetabolic osseous activity. Incidental CT findings: Remote left sixth anterior rib fracture. 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 IMPRESSION: Continued decrease in size and abnormal FDG activity in the index left axillary lymph node with interval decrease in size and near complete resolution of the abnormal FDG avidity in the left inguinal lymph nodes, both of which demonstrate metabolic activity less than that of mediastinal blood pool. No new pathologically enlarged or abnormally hypermetabolic lymph nodes above or below the diaphragm. Deauville 2. Decreased hypermetabolic activity associated with the low-density fluid collection along the right proximal bicep muscle, favored to represent synovitis/bursitis. Deauville X. Electronically Signed   By: Dellis Filbert  Nance Pew MD   On: 02/20/2021 13:01   NM PET Image Restag (PS) Skull Base To Thigh  Result Date: 12/02/2020 CLINICAL DATA:  Subsequent treatment strategy for high-grade B-cell lymphoma. EXAM: NUCLEAR MEDICINE PET SKULL BASE TO THIGH TECHNIQUE: 8 point sick mCi F-18 FDG was injected intravenously. Full-ring PET imaging was performed from the  skull base to thigh after the radiotracer. CT data was obtained and used for attenuation correction and anatomic localization. Fasting blood glucose: 91 mg/dl COMPARISON:  Multiple exams, including 08/03/2020 FINDINGS: Mediastinal blood pool activity: SUV max 2.0 Liver activity: SUV max 3.1 NECK: No significant abnormal hypermetabolic activity in this region. Incidental CT findings: Chronic left maxillary sinusitis. CHEST: Approximately 3.0 by 1.2 cm hypodense lesion adjacent to the right axillary neurovascular structures and tracking adjacent to the right biceps muscle, maximum SUV 7.6, possibly reflecting local bursitis/synovitis or a low-density Deauville 5 lymph node in this vicinity. This is higher in position than a typical axillary lymph node. Prior left axillary lymph 0.9 cm in short axis on image 86 series 3 (formerly 1.4 cm), maximum SUV 1.5 (formerly 2.5). This is currently Deauville 2. Incidental CT findings: Right Port-A-Cath tip: Right atrium. Coronary, aortic arch, and branch vessel atherosclerotic vascular disease. Low-density blood pool suggests anemia. Dependent subsegmental atelectasis in both lower lobes. ABDOMEN/PELVIS: Prior gastric activity has resolved. There are small but newly hypermetabolic left inguinal lymph nodes, including a somewhat superficial 0.8 cm in short axis left inguinal lymph node on image 252 of series 3 (previously 0.3 cm) with maximum SUV 5.6 (Deauville 4). Accentuated activity just posterior to the top of the urinary bladder is thought be physiologic activity in bowel. Incidental CT findings: Aortoiliac atherosclerotic vascular disease. Hypodense lesion in segment 3 of the liver is not hypermetabolic and likely benign. Left renal cysts appear photopenic. SKELETON: Subacute likely benign fracture of the left sixth rib anteriorly with associated low-grade activity, maximum SUV 3.9. 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 IMPRESSION: 1. New  Deauville 4 left inguinal lymph nodes are not pathologically enlarged but have increased in size compared to previous. 2. Deauville 5 activity in a low-density collection adjacent to the right proximal biceps muscle, possibly synovitis/bursitis or adenopathy. This is higher in position than the typical axillary lymph node but is along proximal brachial neurovascular structures. 3. Reduced size and activity of the prior left axillary lymph node, currently Deauville 2. 4. Subacute healing likely benign left anterior sixth rib fracture. 5. Other imaging findings of potential clinical significance: Chronic left maxillary sinusitis. Aortic Atherosclerosis (ICD10-I70.0). Coronary atherosclerosis. Low-density blood pool suggests anemia. Left renal cysts. Electronically Signed   By: Van Clines M.D.   On: 12/02/2020 13:26       ASSESSMENT & PLAN:  1. 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[place])   2. 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 IHC MYC+, BCL 6+, negative translocation.  Status post 4 cycles of dose adjusted EPOCH with rituximab.  Patient had +1 dose level increase for cycle 2/cycle 3, +2 dose level increase for cycle 4, +1 dose level for cycle 5 and 6. Intrathecal MTX 5/5/ 2022 Post treatment PET  - Reduced size and activity of the prior left axillary lymph node, currently Deauville 2 - Deauville 5 activity in a low-density collection adjacent to the right proximal biceps muscle - New Deauville  4 left inguinal lymph nodes   02/20/2021 follow up PET scan- Deauville 2 -Continued decrease in size and abnormal FDG activity in the index left axillary lymph node -near complete resolution of the abnormal FDG avidity in the left inguinal lymph nodes - Decreased hypermetabolic activity associated with the low-density fluid  collection along the right proximal bicep muscle, favored to represent synovitis/bursitis   LDH is chronically increased recently, with no radiographic signs of recurrence/ progression. Probably due to HIV medication/chronic inflammation.   # Port Cath in place. Port flush every 8 weeks.   #Grade 2 neuropathy, recommend patient to start gabapentin 100 mg 3 times daily. Stable symptoms  # AIDS patient follows with ID physician.  Has been on antiretroviral therapy with Biktarvy .   Supportive care measures are necessary for patient well-being and will be provided as necessary. We spent sufficient time to discuss many aspect of care, questions were answered to patient and wife's satisfaction.     All questions were answered. The patient knows to call the clinic with any problems questions or concerns.  cc Nobie Putnam *  Follow-up in    follow up in 6 months.   Earlie Server, MD, PhD Hematology Oncology [facility] Pager- [phone] 02/21/2021

## 2021-03-06 ENCOUNTER — Inpatient Hospital Stay: Payer: Medicare HMO | Attending: Oncology

## 2021-03-06 DIAGNOSIS — C8510 Unspecified B-cell lymphoma, unspecified site: Secondary | ICD-10-CM | POA: Diagnosis not present

## 2021-03-06 DIAGNOSIS — Z452 Encounter for adjustment and management of vascular access device: Secondary | ICD-10-CM | POA: Diagnosis not present

## 2021-03-06 DIAGNOSIS — Z95828 Presence of other vascular implants and grafts: Secondary | ICD-10-CM

## 2021-03-06 MED ORDER — HEPARIN SOD (PORK) LOCK FLUSH 100 UNIT/ML IV SOLN
500.0000 [IU] | Freq: Once | INTRAVENOUS | Status: AC
Start: 2021-03-06 — End: 2021-03-06
  Administered 2021-03-06: 500 [IU] via INTRAVENOUS
  Filled 2021-03-06: qty 5

## 2021-03-06 MED ORDER — SODIUM CHLORIDE 0.9% FLUSH
10.0000 mL | INTRAVENOUS | Status: DC | PRN
  Administered 2021-03-06: 10 mL via INTRAVENOUS
  Filled 2021-03-06: qty 10

## 2021-03-15 ENCOUNTER — Other Ambulatory Visit (HOSPITAL_COMMUNITY): Payer: Self-pay

## 2021-03-20 ENCOUNTER — Other Ambulatory Visit (HOSPITAL_COMMUNITY): Payer: Self-pay

## 2021-04-01 ENCOUNTER — Ambulatory Visit: Payer: Medicare HMO

## 2021-04-11 ENCOUNTER — Other Ambulatory Visit (HOSPITAL_COMMUNITY): Payer: Self-pay

## 2021-04-18 ENCOUNTER — Other Ambulatory Visit (HOSPITAL_COMMUNITY): Payer: Self-pay

## 2021-04-24 ENCOUNTER — Other Ambulatory Visit: Payer: Self-pay

## 2021-04-24 ENCOUNTER — Other Ambulatory Visit: Payer: Medicare HMO

## 2021-04-24 DIAGNOSIS — B20 Human immunodeficiency virus [HIV] disease: Secondary | ICD-10-CM | POA: Diagnosis not present

## 2021-04-25 ENCOUNTER — Other Ambulatory Visit: Payer: Self-pay | Admitting: Internal Medicine

## 2021-04-25 LAB — T-HELPER CELL (CD4) - (RCID CLINIC ONLY)
CD4 % Helper T Cell: 20 % — ABNORMAL LOW (ref 33–65)
CD4 T Cell Abs: 329 /uL — ABNORMAL LOW (ref 400–1790)

## 2021-04-26 ENCOUNTER — Encounter: Payer: Self-pay | Admitting: Oncology

## 2021-04-26 LAB — COMPREHENSIVE METABOLIC PANEL
AG Ratio: 1.5 (calc) (ref 1.0–2.5)
ALT: 10 U/L (ref 9–46)
AST: 16 U/L (ref 10–35)
Albumin: 3.7 g/dL (ref 3.6–5.1)
Alkaline phosphatase (APISO): 78 U/L (ref 35–144)
BUN: 20 mg/dL (ref 7–25)
CO2: 28 mmol/L (ref 20–32)
Calcium: 9 mg/dL (ref 8.6–10.3)
Chloride: 106 mmol/L (ref 98–110)
Creat: 1.17 mg/dL (ref 0.70–1.28)
Globulin: 2.5 g/dL (calc) (ref 1.9–3.7)
Glucose, Bld: 99 mg/dL (ref 65–99)
Potassium: 4.3 mmol/L (ref 3.5–5.3)
Sodium: 139 mmol/L (ref 135–146)
Total Bilirubin: 0.4 mg/dL (ref 0.2–1.2)
Total Protein: 6.2 g/dL (ref 6.1–8.1)

## 2021-04-26 LAB — CBC
HCT: 40 % (ref 38.5–50.0)
Hemoglobin: 13.2 g/dL (ref 13.2–17.1)
MCH: 31.3 pg (ref 27.0–33.0)
MCHC: 33 g/dL (ref 32.0–36.0)
MCV: 94.8 fL (ref 80.0–100.0)
MPV: 9.4 fL (ref 7.5–12.5)
Platelets: 191 10*3/uL (ref 140–400)
RBC: 4.22 10*6/uL (ref 4.20–5.80)
RDW: 13.1 % (ref 11.0–15.0)
WBC: 4.9 10*3/uL (ref 3.8–10.8)

## 2021-04-26 LAB — HIV-1 RNA QUANT-NO REFLEX-BLD
HIV 1 RNA Quant: 43 Copies/mL — ABNORMAL HIGH
HIV-1 RNA Quant, Log: 1.63 Log cps/mL — ABNORMAL HIGH

## 2021-04-27 ENCOUNTER — Telehealth: Payer: Self-pay

## 2021-04-27 NOTE — Telephone Encounter (Signed)
Called patient to go over results, no answer. Left HIPAA compliant voicemail stating that MyChart message will be sent and to please call with any further questions.   Beryle Flock, RN

## 2021-04-27 NOTE — Telephone Encounter (Signed)
-----   Message from Mignon Pine, DO sent at 04/27/2021 10:00 AM EDT ----- Please let patient know that labs are improved.  CMP is normal, CBC is normal.  Viral load not quite to undetectable but slowly getting there (can sometimes take longer with advanced immunosuppression at diagnosis ( I.e. low CD4 count coupled with lymphoma diagnosis).  His CD4 count is now greater than 200 but he should continue TMP SMX until we have documented this on 2 occasions consecutively over 3 months.  Thanks

## 2021-04-28 ENCOUNTER — Other Ambulatory Visit: Payer: Self-pay

## 2021-04-28 ENCOUNTER — Inpatient Hospital Stay: Payer: Medicare HMO | Attending: Oncology

## 2021-04-28 DIAGNOSIS — Z452 Encounter for adjustment and management of vascular access device: Secondary | ICD-10-CM | POA: Diagnosis not present

## 2021-04-28 DIAGNOSIS — Z95828 Presence of other vascular implants and grafts: Secondary | ICD-10-CM

## 2021-04-28 DIAGNOSIS — Z8572 Personal history of non-Hodgkin lymphomas: Secondary | ICD-10-CM | POA: Diagnosis not present

## 2021-04-28 MED ORDER — HEPARIN SOD (PORK) LOCK FLUSH 100 UNIT/ML IV SOLN
INTRAVENOUS | Status: AC
  Filled 2021-04-28: qty 5

## 2021-04-28 MED ORDER — HEPARIN SOD (PORK) LOCK FLUSH 100 UNIT/ML IV SOLN
500.0000 [IU] | Freq: Once | INTRAVENOUS | Status: AC
  Administered 2021-04-28: 500 [IU] via INTRAVENOUS
  Filled 2021-04-28: qty 5

## 2021-04-28 MED ORDER — SODIUM CHLORIDE 0.9% FLUSH
10.0000 mL | INTRAVENOUS | Status: DC | PRN
  Administered 2021-04-28: 10 mL via INTRAVENOUS
  Filled 2021-04-28: qty 10

## 2021-05-01 ENCOUNTER — Inpatient Hospital Stay: Payer: Medicare HMO

## 2021-05-02 ENCOUNTER — Inpatient Hospital Stay: Payer: Medicare HMO

## 2021-05-03 ENCOUNTER — Inpatient Hospital Stay: Payer: Medicare HMO

## 2021-05-04 ENCOUNTER — Inpatient Hospital Stay: Payer: Medicare HMO

## 2021-05-05 ENCOUNTER — Inpatient Hospital Stay: Payer: Medicare HMO

## 2021-05-12 ENCOUNTER — Other Ambulatory Visit: Payer: Self-pay | Admitting: Internal Medicine

## 2021-05-12 ENCOUNTER — Other Ambulatory Visit (HOSPITAL_COMMUNITY): Payer: Self-pay

## 2021-05-15 ENCOUNTER — Other Ambulatory Visit (HOSPITAL_COMMUNITY): Payer: Self-pay

## 2021-05-15 MED ORDER — BIKTARVY 50-200-25 MG PO TABS
1.0000 | ORAL_TABLET | Freq: Every day | ORAL | 5 refills | Status: DC
  Filled 2021-05-15: qty 30, 30d supply, fill #0
  Filled 2021-06-16: qty 30, 30d supply, fill #1
  Filled 2021-07-17: qty 30, 30d supply, fill #2
  Filled 2021-08-14: qty 30, 30d supply, fill #3
  Filled 2021-09-14: qty 30, 30d supply, fill #4
  Filled 2021-10-16: qty 30, 30d supply, fill #5

## 2021-05-16 ENCOUNTER — Other Ambulatory Visit (HOSPITAL_COMMUNITY): Payer: Self-pay

## 2021-05-30 ENCOUNTER — Ambulatory Visit: Payer: Self-pay | Admitting: *Deleted

## 2021-05-30 NOTE — Telephone Encounter (Signed)
Called patient to review questions regarding covid exposure. Patient reports he is not experiencing any symptoms of covid at this time. Denies headache, cough, SOB, fever, muscles aches. Patient reports being exposed to someone on a bus trip yesterday and was approx 3-4 feet and possibly around positive person 15 minutes or more. Reviewed incubation period  may take 5- 7 days and to test for covid after 5 days. Monitor for symptoms within 2-14 days . Patient reports he and his wife have been vaccinated. Care advise given. Patient verbalized understanding of care advise and to call back for further questions and if symptoms noted.

## 2021-05-30 NOTE — Telephone Encounter (Signed)
Reason for Disposition  [1] CLOSE CONTACT COVID-19 EXPOSURE within last 14 days AND [2] NO symptoms  Answer Assessment - Initial Assessment Questions 1. COVID-19 EXPOSURE: "Please describe how you were exposed to someone with a COVID-19 infection."     In a group on a bus , someone tested positive for covid  2. PLACE of CONTACT: "Where were you when you were exposed to COVID-19?" (e.g., home, school, medical waiting room; which city?)     Yesterday 05/29/21 3. TYPE of CONTACT: "How much contact was there?" (e.g., sitting next to, live in same house, work in same office, same building)     On the same bus  4. DURATION of CONTACT: "How long were you in contact with the COVID-19 patient?" (e.g., a few seconds, passed by person, a few minutes, 15 minutes or longer, live with the patient)     3-4 feet apart and 15 minutes or longer  5. MASK: "Were you wearing a mask?" "Was the other person wearing a mask?" Note: wearing a mask reduces the risk of an otherwise close contact.     Not at first but after knowledge of covid positive did wear a mask  6. DATE of CONTACT: "When did you have contact with a COVID-19 patient?" (e.g., how many days ago)     Yesterday 1 day ago  7. COMMUNITY SPREAD: "Are there lots of cases of COVID-19 (community spread) where you live?" (See public health department website, if unsure)       X 1  8. SYMPTOMS: "Do you have any symptoms?" (e.g., fever, cough, breathing difficulty, loss of taste or smell)     Denies  9. VACCINE: "Have you gotten the COVID-19 vaccine?" If Yes, ask: "Which one, how many shots, when did you get it?"     [employer]  10. BOOSTER: "Have you received your COVID-19 booster?" If Yes, ask: "Which one and when did you get it?"       [place]  11. PREGNANCY OR POSTPARTUM: "Is there any chance you are pregnant?" "When was your last menstrual period?" "Did you deliver in the last 2 weeks?"       na 12. HIGH RISK: "Do you have any heart or lung problems?"  (e.g., asthma , COPD, heart failure) "Do you have a weak immune system or other risk factors?" (e.g., HIV positive, chemotherapy, renal failure, diabetes mellitus, sickle cell anemia, obesity)       Denies  13. TRAVEL: "Have you traveled out of the country recently?" If Yes, ask: "When and where?"  Note: Travel becomes less relevant if there is widespread community transmission where the patient lives.       na  Protocols used: Coronavirus (XIDHW-86) Exposure-A-AH

## 2021-06-07 ENCOUNTER — Other Ambulatory Visit (HOSPITAL_COMMUNITY): Payer: Self-pay

## 2021-06-16 ENCOUNTER — Other Ambulatory Visit (HOSPITAL_COMMUNITY): Payer: Self-pay

## 2021-06-20 ENCOUNTER — Encounter: Payer: Self-pay | Admitting: Oncology

## 2021-06-20 ENCOUNTER — Telehealth: Payer: Self-pay

## 2021-06-20 ENCOUNTER — Other Ambulatory Visit (HOSPITAL_COMMUNITY): Payer: Self-pay

## 2021-06-20 NOTE — Telephone Encounter (Signed)
[facility] Patient Advocate Encounter   I was successful in securing patient a $ 7500.00 grant from [employer] to provide copayment coverage for Biktarvy.  The patient's out of pocket cost will be 5.00 monthly.     I have spoken with the patient.    The billing information is as follows and has been shared with [facility].       Dates of Eligibility: 06/20/21 through 07/29/21  Patient knows to call the office with questions or concerns.  Ileene Patrick, [facility] Patient [facility] for Infectious Disease Phone: [phone] Fax:  [phone]

## 2021-06-26 ENCOUNTER — Inpatient Hospital Stay: Payer: Medicare HMO | Attending: Oncology

## 2021-07-14 ENCOUNTER — Other Ambulatory Visit: Payer: Self-pay

## 2021-07-14 DIAGNOSIS — Z79899 Other long term (current) drug therapy: Secondary | ICD-10-CM

## 2021-07-14 DIAGNOSIS — Z113 Encounter for screening for infections with a predominantly sexual mode of transmission: Secondary | ICD-10-CM

## 2021-07-14 DIAGNOSIS — B20 Human immunodeficiency virus [HIV] disease: Secondary | ICD-10-CM

## 2021-07-17 ENCOUNTER — Ambulatory Visit: Payer: Medicare HMO | Admitting: Internal Medicine

## 2021-07-17 ENCOUNTER — Other Ambulatory Visit (HOSPITAL_COMMUNITY): Payer: Self-pay

## 2021-07-18 ENCOUNTER — Other Ambulatory Visit (HOSPITAL_COMMUNITY)
Admission: RE | Admit: 2021-07-18 | Discharge: 2021-07-18 | Disposition: A | Discharge status: Home / Self Care | Payer: Medicare HMO | Source: Ambulatory Visit | Attending: Internal Medicine | Admitting: Internal Medicine

## 2021-07-18 ENCOUNTER — Other Ambulatory Visit: Payer: Medicare HMO

## 2021-07-18 ENCOUNTER — Other Ambulatory Visit: Payer: Self-pay

## 2021-07-18 DIAGNOSIS — Z79899 Other long term (current) drug therapy: Secondary | ICD-10-CM | POA: Diagnosis not present

## 2021-07-18 DIAGNOSIS — Z113 Encounter for screening for infections with a predominantly sexual mode of transmission: Secondary | ICD-10-CM | POA: Diagnosis not present

## 2021-07-18 DIAGNOSIS — B20 Human immunodeficiency virus [HIV] disease: Secondary | ICD-10-CM | POA: Insufficient documentation

## 2021-07-19 LAB — URINE CYTOLOGY ANCILLARY ONLY
Chlamydia: NEGATIVE
Comment: NEGATIVE
Comment: NORMAL
Neisseria Gonorrhea: NEGATIVE

## 2021-07-19 LAB — T-HELPER CELL (CD4) - (RCID CLINIC ONLY)
CD4 % Helper T Cell: 18 % — ABNORMAL LOW (ref 33–65)
CD4 T Cell Abs: 392 /uL — ABNORMAL LOW (ref 400–1790)

## 2021-07-21 ENCOUNTER — Encounter: Payer: Self-pay | Admitting: Oncology

## 2021-07-21 LAB — CBC WITH DIFFERENTIAL/PLATELET
Absolute Monocytes: 581 cells/uL (ref 200–950)
Basophils Absolute: 40 cells/uL (ref 0–200)
Basophils Relative: 0.7 %
Eosinophils Absolute: 103 cells/uL (ref 15–500)
Eosinophils Relative: 1.8 %
HCT: 40.9 % (ref 38.5–50.0)
Hemoglobin: 13.6 g/dL (ref 13.2–17.1)
Lymphs Abs: 2286 cells/uL (ref 850–3900)
MCH: 31.1 pg (ref 27.0–33.0)
MCHC: 33.3 g/dL (ref 32.0–36.0)
MCV: 93.6 fL (ref 80.0–100.0)
MPV: 9.1 fL (ref 7.5–12.5)
Monocytes Relative: 10.2 %
Neutro Abs: 2690 cells/uL (ref 1500–7800)
Neutrophils Relative %: 47.2 %
Platelets: 182 10*3/uL (ref 140–400)
RBC: 4.37 10*6/uL (ref 4.20–5.80)
RDW: 13 % (ref 11.0–15.0)
Total Lymphocyte: 40.1 %
WBC: 5.7 10*3/uL (ref 3.8–10.8)

## 2021-07-21 LAB — COMPLETE METABOLIC PANEL WITH GFR
AG Ratio: 1.5 (calc) (ref 1.0–2.5)
ALT: 12 U/L (ref 9–46)
AST: 15 U/L (ref 10–35)
Albumin: 3.6 g/dL (ref 3.6–5.1)
Alkaline phosphatase (APISO): 72 U/L (ref 35–144)
BUN: 19 mg/dL (ref 7–25)
CO2: 28 mmol/L (ref 20–32)
Calcium: 8.9 mg/dL (ref 8.6–10.3)
Chloride: 106 mmol/L (ref 98–110)
Creat: 1.02 mg/dL (ref 0.70–1.28)
Globulin: 2.4 g/dL (calc) (ref 1.9–3.7)
Glucose, Bld: 85 mg/dL (ref 65–99)
Potassium: 4.2 mmol/L (ref 3.5–5.3)
Sodium: 141 mmol/L (ref 135–146)
Total Bilirubin: 0.4 mg/dL (ref 0.2–1.2)
Total Protein: 6 g/dL — ABNORMAL LOW (ref 6.1–8.1)
eGFR: 78 mL/min/{1.73_m2} (ref >=60)

## 2021-07-21 LAB — LIPID PANEL
Cholesterol: 172 mg/dL (ref <200)
HDL: 35 mg/dL — ABNORMAL LOW (ref >=40)
LDL Cholesterol (Calc): 111 mg/dL (calc) — ABNORMAL HIGH
Non-HDL Cholesterol (Calc): 137 mg/dL (calc) — ABNORMAL HIGH (ref <130)
Total CHOL/HDL Ratio: 4.9 (calc) (ref <5.0)
Triglycerides: 149 mg/dL (ref <150)

## 2021-07-21 LAB — RPR: RPR Ser Ql: NONREACTIVE

## 2021-07-21 LAB — HIV-1 RNA QUANT-NO REFLEX-BLD
HIV 1 RNA Quant: 28 Copies/mL — ABNORMAL HIGH
HIV-1 RNA Quant, Log: 1.45 Log cps/mL — ABNORMAL HIGH

## 2021-07-25 ENCOUNTER — Other Ambulatory Visit (HOSPITAL_COMMUNITY): Payer: Self-pay

## 2021-08-01 ENCOUNTER — Ambulatory Visit: Payer: Medicare HMO | Admitting: Internal Medicine

## 2021-08-10 NOTE — Progress Notes (Signed)
[facility] for Infectious Disease   CHIEF COMPLAINT    HIV follow up.    SUBJECTIVE:    Justin Wallace is a 76 y.o. male with PMHx as below who presents to the clinic for HIV follow up.   Please see A&P for the details of today's visit and status of the patient's medical problems.   Patient's Medications  New Prescriptions   No medications on file  Previous Medications   BICTEGRAVIR-EMTRICITABINE-TENOFOVIR AF (BIKTARVY) 50-200-25 MG TABS TABLET    TAKE 1 TABLET BY MOUTH DAILY.  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   Respiratory: Negative.    Cardiovascular: Negative.   Gastrointestinal: Negative.   Genitourinary: Negative.     OBJECTIVE:    Vitals:   08/11/21 0933  BP: 116/73  Pulse: (!) 57  Temp: 97.8 F (36.6 C)  TempSrc: Oral  SpO2: 98%  Weight: 175 lb (79.4 kg)     Body mass index is 25.84 kg/m.  Physical Exam Constitutional:      General: He is not in acute distress.    Appearance: Normal appearance.  HENT:     Head: Normocephalic and atraumatic.  Eyes:     Extraocular Movements: Extraocular movements intact.     Conjunctiva/sclera: Conjunctivae normal.  Pulmonary:     Effort: Pulmonary effort is normal. No respiratory distress.   Abdominal:     General: There is no distension.     Palpations: Abdomen is soft.  Skin:    General: Skin is warm and dry.     Findings: No rash.  Neurological:     General: No focal deficit present.     Mental Status: He is alert and oriented to person, place, and time.  Psychiatric:        Mood and Affect: Mood normal.        Behavior: Behavior normal.    Labs and Microbiology: CMP Latest Ref Rng & Units 07/18/2021 04/24/2021 02/17/2021  Glucose 65 - 99 mg/dL 85 99 91  BUN 7 - 25 mg/dL 19 20 30(H)  Creatinine 0.70 - 1.28 mg/dL 1.02 1.17 1.07  Sodium 135 - 146 mmol/L 141 139 138  Potassium 3.5 - 5.3 mmol/L 4.2 4.3 3.8  Chloride 98 - 110 mmol/L 106 106 109  CO2 20 - 32 mmol/L 28 28 23   Calcium 8.6 - 10.3 mg/dL 8.9 9.0 8.7(L)  Total Protein 6.1 - 8.1 g/dL 6.0(L) 6.2 6.8  Total Bilirubin 0.2 - 1.2 mg/dL 0.4 0.4 0.5  Alkaline Phos 38 - 126 U/L - - 80  AST 10 - 35 U/L 15 16 29   ALT  9 - 46 U/L 12 10 17    CBC Latest Ref Rng & Units 07/18/2021 04/24/2021 02/17/2021  WBC 3.8 - 10.8 Thousand/uL 5.7 4.9 4.7  Hemoglobin 13.2 - 17.1 g/dL 13.6 13.2 12.0(L)  Hematocrit 38.5 - 50.0 % 40.9 40.0 35.7(L)  Platelets 140 - 400 Thousand/uL 182 191 178     Lab Results  Component Value Date   HIV1RNAQUANT 28 (H) 07/18/2021   HIV1RNAQUANT 43 (H) 04/24/2021   HIV1RNAQUANT 75 (H) 01/09/2021   CD4TABS 392 (L) 07/18/2021   CD4TABS 329 (L) 04/24/2021   CD4TABS 193 (L) 01/09/2021    RPR and STI: Lab Results  Component Value Date   LABRPR NON-REACTIVE 07/18/2021   LABRPR NON-REACTIVE 05/05/2020    STI Results GC CT  07/18/2021 Negative Negative  05/05/2020 Negative Negative    Hepatitis B: Lab Results  Component Value Date   HEPBSAB NON-REACTIVE 05/05/2020   HEPBSAG NON-REACTIVE 05/05/2020   HEPBCAB NON-REACTIVE 05/05/2020   Hepatitis C: Lab Results  Component Value Date   HEPCAB NON-REACTIVE 05/05/2020   Hepatitis A: Lab Results  Component Value Date   HAV REACTIVE (A)  05/05/2020   Lipids: Lab Results  Component Value Date   CHOL 172 07/18/2021   TRIG 149 07/18/2021   HDL 35 (L) 07/18/2021   CHOLHDL 4.9 07/18/2021   LDLCALC 111 (H) 07/18/2021     ASSESSMENT & PLAN:    HIV disease ([place]) Patient is currently on [place] and doing well without any missed doses or side effects.  Labs from 07/18/21 notable for CD4 count 392 (18%) and viral load barely above detection at 28 copies.  He does take a multivitamin supplement, however, separates this from his Biktarvy by taken vitamin in the morning and Biktarvy in the evening.  Will continue with Biktarvy and follow up in 6 months.  Need for pneumocystis prophylaxis He no longer requires Bactrim for PCP prophylaxis since CD4 count now greater than 200 for more than 3 months.  Will discontinue at this time.   Lymphoma ([place]) Continues to follow up with oncology.    Vaccine counseling He is non-immune to hepatitis B and no previous vaccination is on file.  Recommend hep B vaccine #1 today and 2nd dose in 4 weeks.  Patient is agreeable.     [name] for Infectious Disease [facility] 08/11/2021, 9:45 AM

## 2021-08-11 ENCOUNTER — Other Ambulatory Visit: Payer: Self-pay

## 2021-08-11 ENCOUNTER — Encounter: Payer: Self-pay | Admitting: Internal Medicine

## 2021-08-11 ENCOUNTER — Ambulatory Visit (INDEPENDENT_AMBULATORY_CARE_PROVIDER_SITE_OTHER): Payer: Medicare HMO | Admitting: Internal Medicine

## 2021-08-11 VITALS — BP 116/73 | HR 57 | Temp 97.8°F | Wt 175.0 lb

## 2021-08-11 DIAGNOSIS — C8510 Unspecified B-cell lymphoma, unspecified site: Secondary | ICD-10-CM

## 2021-08-11 DIAGNOSIS — Z7185 Encounter for immunization safety counseling: Secondary | ICD-10-CM

## 2021-08-11 DIAGNOSIS — Z298 Encounter for other specified prophylactic measures: Secondary | ICD-10-CM

## 2021-08-11 DIAGNOSIS — B20 Human immunodeficiency virus [HIV] disease: Secondary | ICD-10-CM

## 2021-08-11 DIAGNOSIS — Z23 Encounter for immunization: Secondary | ICD-10-CM

## 2021-08-11 NOTE — Addendum Note (Signed)
Addended by: Leatrice Jewels on: 08/11/2021 09:50 AM   Modules accepted: Orders

## 2021-08-11 NOTE — Assessment & Plan Note (Signed)
Continues to follow up with oncology.

## 2021-08-11 NOTE — Patient Instructions (Signed)
Thank you for coming to see me today. It was a pleasure seeing you.  To Do: Continue Biktarvy every day Stop Bactrim for prophylaxis since your CD4 count has recovered Hepatitis B vaccine dose #1 today.  Return in 4 weeks for your 2nd dose Follow up again with me in 6 months for routine labs and follow up.  If you have any questions or concerns, please do not hesitate to call the office at [phone].  Take Care,   Jule Ser

## 2021-08-11 NOTE — Assessment & Plan Note (Addendum)
Patient is currently on [place] and doing well without any missed doses or side effects.  Labs from 07/18/21 notable for CD4 count 392 (18%) and viral load barely above detection at 28 copies.  He does take a multivitamin supplement, however, separates this from his Biktarvy by taken vitamin in the morning and Biktarvy in the evening.  Will continue with Biktarvy and follow up in 6 months.

## 2021-08-11 NOTE — Assessment & Plan Note (Signed)
He is non-immune to hepatitis B and no previous vaccination is on file.  Recommend hep B vaccine #1 today and 2nd dose in 4 weeks.  Patient is agreeable.

## 2021-08-11 NOTE — Assessment & Plan Note (Addendum)
He no longer requires Bactrim for PCP prophylaxis since CD4 count now greater than 200 for more than 3 months.  Will discontinue at this time.

## 2021-08-14 ENCOUNTER — Other Ambulatory Visit (HOSPITAL_COMMUNITY): Payer: Self-pay

## 2021-08-21 ENCOUNTER — Other Ambulatory Visit: Payer: Self-pay

## 2021-08-21 ENCOUNTER — Inpatient Hospital Stay: Payer: Medicare HMO | Attending: Oncology

## 2021-08-21 DIAGNOSIS — C8510 Unspecified B-cell lymphoma, unspecified site: Secondary | ICD-10-CM | POA: Insufficient documentation

## 2021-08-21 DIAGNOSIS — Z452 Encounter for adjustment and management of vascular access device: Secondary | ICD-10-CM | POA: Insufficient documentation

## 2021-08-21 DIAGNOSIS — Z95828 Presence of other vascular implants and grafts: Secondary | ICD-10-CM

## 2021-08-21 MED ORDER — HEPARIN SOD (PORK) LOCK FLUSH 100 UNIT/ML IV SOLN
500.0000 [IU] | Freq: Once | INTRAVENOUS | Status: AC
Start: 2021-08-21 — End: 2021-08-21
  Administered 2021-08-21: 500 [IU] via INTRAVENOUS
  Filled 2021-08-21: qty 5

## 2021-08-21 MED ORDER — SODIUM CHLORIDE 0.9% FLUSH
10.0000 mL | Freq: Once | INTRAVENOUS | Status: AC
  Administered 2021-08-21: 10 mL via INTRAVENOUS
  Filled 2021-08-21: qty 10

## 2021-08-22 ENCOUNTER — Other Ambulatory Visit (HOSPITAL_COMMUNITY): Payer: Self-pay

## 2021-08-28 ENCOUNTER — Inpatient Hospital Stay: Payer: Medicare HMO

## 2021-08-28 ENCOUNTER — Inpatient Hospital Stay: Payer: Medicare HMO | Admitting: Oncology

## 2021-08-28 ENCOUNTER — Other Ambulatory Visit: Payer: Self-pay

## 2021-08-28 ENCOUNTER — Encounter: Payer: Self-pay | Admitting: Oncology

## 2021-08-28 VITALS — BP 109/68 | HR 59 | Temp 97.1°F | Wt 175.5 lb

## 2021-08-28 DIAGNOSIS — E8809 Other disorders of plasma-protein metabolism, not elsewhere classified: Secondary | ICD-10-CM

## 2021-08-28 DIAGNOSIS — B20 Human immunodeficiency virus [HIV] disease: Secondary | ICD-10-CM

## 2021-08-28 DIAGNOSIS — C8510 Unspecified B-cell lymphoma, unspecified site: Secondary | ICD-10-CM

## 2021-08-28 DIAGNOSIS — Z95828 Presence of other vascular implants and grafts: Secondary | ICD-10-CM | POA: Diagnosis not present

## 2021-08-28 DIAGNOSIS — Z452 Encounter for adjustment and management of vascular access device: Secondary | ICD-10-CM | POA: Diagnosis not present

## 2021-08-28 DIAGNOSIS — E46 Unspecified protein-calorie malnutrition: Secondary | ICD-10-CM

## 2021-08-28 DIAGNOSIS — T451X5A Adverse effect of antineoplastic and immunosuppressive drugs, initial encounter: Secondary | ICD-10-CM | POA: Diagnosis not present

## 2021-08-28 DIAGNOSIS — G620 Drug-induced polyneuropathy: Secondary | ICD-10-CM | POA: Diagnosis not present

## 2021-08-28 LAB — CBC WITH DIFFERENTIAL/PLATELET
Abs Immature Granulocytes: 0.01 10*3/uL (ref 0.00–0.07)
Basophils Absolute: 0 10*3/uL (ref 0.0–0.1)
Basophils Relative: 1 %
Eosinophils Absolute: 0.1 10*3/uL (ref 0.0–0.5)
Eosinophils Relative: 3 %
HCT: 38.1 % — ABNORMAL LOW (ref 39.0–52.0)
Hemoglobin: 13 g/dL (ref 13.0–17.0)
Immature Granulocytes: 0 %
Lymphocytes Relative: 37 %
Lymphs Abs: 2.1 10*3/uL (ref 0.7–4.0)
MCH: 32.3 pg (ref 26.0–34.0)
MCHC: 34.1 g/dL (ref 30.0–36.0)
MCV: 94.5 fL (ref 80.0–100.0)
Monocytes Absolute: 0.6 10*3/uL (ref 0.1–1.0)
Monocytes Relative: 11 %
Neutro Abs: 2.7 10*3/uL (ref 1.7–7.7)
Neutrophils Relative %: 48 %
Platelets: 164 10*3/uL (ref 150–400)
RBC: 4.03 MIL/uL — ABNORMAL LOW (ref 4.22–5.81)
RDW: 13.4 % (ref 11.5–15.5)
WBC: 5.5 10*3/uL (ref 4.0–10.5)
nRBC: 0 % (ref 0.0–0.2)

## 2021-08-28 LAB — COMPREHENSIVE METABOLIC PANEL
ALT: 12 U/L (ref 0–44)
AST: 18 U/L (ref 15–41)
Albumin: 3.2 g/dL — ABNORMAL LOW (ref 3.5–5.0)
Alkaline Phosphatase: 77 U/L (ref 38–126)
Anion gap: 6 (ref 5–15)
BUN: 25 mg/dL — ABNORMAL HIGH (ref 8–23)
CO2: 25 mmol/L (ref 22–32)
Calcium: 8.4 mg/dL — ABNORMAL LOW (ref 8.9–10.3)
Chloride: 106 mmol/L (ref 98–111)
Creatinine, Ser: 0.97 mg/dL (ref 0.61–1.24)
GFR, Estimated: >60 mL/min (ref >60)
Glucose, Bld: 117 mg/dL — ABNORMAL HIGH (ref 70–99)
Potassium: 4.2 mmol/L (ref 3.5–5.1)
Sodium: 137 mmol/L (ref 135–145)
Total Bilirubin: 0.3 mg/dL (ref 0.3–1.2)
Total Protein: 6.1 g/dL — ABNORMAL LOW (ref 6.5–8.1)

## 2021-08-28 LAB — LACTATE DEHYDROGENASE: LDH: 107 U/L (ref 98–192)

## 2021-08-28 NOTE — Progress Notes (Signed)
Hematology/Oncology follow up note [phone] [phone]   Patient Care Team: Olin Hauser, DO as PCP - General (Family Medicine) Earlie Server, MD as Consulting Physician (Hematology and Oncology)  REFERRING PROVIDER: Nobie Putnam *  CHIEF COMPLAINTS/REASON FOR VISIT:  Follow up for HIV related high-grade B-cell lymphoma   HISTORY OF PRESENTING ILLNESS:   Justin Wallace is a  76 y.o.  male with PMH listed below was seen in consultation at the request of  Nobie Putnam *  for evaluation of symptomatic anemia Patient reported history of sudden onset of diffuse body aches, decreased appetite, headache low-grade fever, profound weakness/fatigue about 2 weeks ago.  Prior to the onset of symptoms, he went to a funeral as well as applicable clinic.  He reports that he has received COVID-19 vaccination previously.  He moved from [place].  Daughter is an Therapist, sports and told him to get COVID-19 PCR checked and he was tested negative. Patient also reports a sudden drop of weight since the onset of symptoms.  Patient has a history of celiac disease.  No colonoscopy records or pathology records in current EMR.  Diagnosis was done by [facility] gastroenterology.  Patient reports that he was in his usual state of health until the onset of symptoms.  03/10/2020, patient was evaluated by primary care provider.  Blood work showed mild anemia with hemoglobin of 13.1, Iron panel showed decreased saturation of 8, ferritin 218, TIBC 291, patient was referred to hematology for evaluation of symptomatic anemia and iron deficiency.  Patient reports that her body aches, fever and headache symptoms have improved.  However he continues to feel very tired and fatigued.  Appetite has decreased and weight remains low.  # Physical examination showed left axillary mass  ultrasound confirmed a left axillary large soft tissue mass up to 6.8 cm with internal vascularity. 03/30/2020 CT  chest with contrast showed soft tissue mass/enlarged lymph node in the left axilla 6.3 cm, concerning for primary mass or metastatic lymphadenopathy.  There are additional prominent although much smaller right axillary lymph nodes, pretracheal and superior mediastinal lymph nodes.  Findings are concerning for malignancy such as lymphoma or nodal metastatic disease.  # treated for Lymes disease, finished doxycycline 19m BID for 21 days course. 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 he has started treatment with Biktarvy, also started on Bactrim daily.  04/26/2020 PET scan showed large intensely hypermetabolic left axillary mass SUV 28.  Additional small bilateral axilla lymph node with mild metabolic activity SUV 2.  Metabolic activity through the porta hepatis region without enlarged nodes identified.  No bone lesions. 05/02/2020 bone marrow biopsy showed hypercellular marrow for age with trilineage hematopoiesis.  Increased number of CD10 positive B cells, cannot rule out minimal or subtle involvement of marrow by the high-grade B-cell lymphoma. Normal cytogenetics. 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  Positive for BCL6 and MYC expression.  FISH testing showed negative for BCL-2/BCL6/MYC gene rearrangement.   Trisomy 3 and 18 were identified and are nonspecific findings seen in clonal lymphoid neoplasm.  Final results were signed out on 05/30/2020-due to the delay of  FISH results.  Bsed on the F[place]results, lymphoma is best classified as high-grade B-cell lymphoma not otherwise specified.  It does not meet morphological criteria for diffuse large B-cell lymphoma nor molecular criteria for Burkitt lymphoma.  11 q. alteration testing has been sent.  Results are pending. 04/26/2020, MUGA testing showed normal LVEF  of 56% with normal LV wall motion.   05/13/2020, echocardiogram showed low normal end ejection fraction of 50 to  55%.  #04/19/2020 Mediport placed by Dr. Lucky Cowboy # Chemotherapy # 06/06/2020 cycle 1 R-EPOCH +GCSF                    Intrathecal MTX # 06/27/2020 cycle 2 R- EPOCH +GCSF [+ 1 level]  Intrathecal MTX # 07/18/2020 cycle 3 R -EPOCH +GCSF [+ 1 level]  Intrathecal MTX # 08/03/2020 PET negative. [place] 3 # 08/08/2020 cycle 4 R EPOCH +GCSF  [+ 2 level]  Intrathecal MTX- did not tolerate due to cytopenia and mucositis # 08/29/2020 cycle 5 R -EPOCH +GCSF [+ 1 level]  Intrathecal MTX # 09/19/2020 cycle 6 R -EPOCH +GCSF [+ 1 level]  Intrathecal MTX  5/5/ 2022 Post treatment PET  - Reduced size and activity of the prior left axillary lymph node, currently [place] 2 - [place] 5 activity in a low-density collection adjacent to the right proximal biceps muscle - [place] 4 left inguinal lymph nodes   02/20/2021 follow up PET scan- [place] 2 -Continued decrease in size and abnormal FDG activity in the index left axillary lymph node -near complete resolution of the abnormal FDG avidity in the left inguinal lymph nodes - Decreased hypermetabolic activity associated with the low-density fluid collection along the right proximal bicep muscle, favored to represent synovitis/bursitis   INTERVAL HISTORY Justin Wallace is a 76 y.o. male who has above history reviewed by me today presents for follow up visit for high-grade B-cell lymphoma  Patient is here by himself.  He reports feeling well. His neuropathy has improved.  He is currently off gabapentin treatments.  He follows up with chiropractor Dr. Yevonne Pax.  He feels that his gait has improved. Patient has gained weight.  No unintentional weight loss, night sweats, fever.   Review of Systems  Constitutional:  Negative for appetite change, chills, fever and unexpected weight change.  HENT:   Negative for hearing loss, mouth sores and voice change.   Eyes:  Negative for eye problems and icterus.  Respiratory:  Negative for chest tightness, cough and  shortness of breath.   Cardiovascular:  Negative for chest pain and leg swelling.  Gastrointestinal:  Negative for abdominal distention and abdominal pain.  Endocrine: Negative for hot flashes.  Genitourinary:  Negative for difficulty urinating, dysuria and frequency.   Musculoskeletal:  Negative for arthralgias.  Skin:  Negative for itching and rash.  Neurological:  Positive for numbness. Negative for light-headedness.  Hematological:  Negative for adenopathy. Does not bruise/bleed easily.  Psychiatric/Behavioral:  Negative for confusion.    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   HIV (human immunodeficiency virus infection) ([place])    Lyme disease    Lymphoma ([place])     SURGICAL HISTORY: Past Surgical History:  Procedure Laterality Date   PORTA CATH INSERTION N/A 04/25/2020   Procedure: PORTA CATH INSERTION;  Surgeon: Algernon Huxley, MD;  Location: [facility];  Service: Cardiovascular;  Laterality: N/A;    SOCIAL HISTORY: Social History   Socioeconomic History   Marital status: Married    Spouse name: Opal Sidles    Number of children: 2   Years of education: Not on file   Highest education level: Not on file  Occupational History   Not on file  Tobacco Use   Smoking status: Never  Smokeless tobacco: Never  Vaping Use   Vaping Use: Never used  Substance and Sexual Activity   Alcohol use: Yes    Comment: occasional   Drug use: Never   Sexual activity: Never  Other Topics Concern   Not on file  Social History Narrative   Not on file   Social Determinants of Health   Financial Resource Strain: Not on file  Food Insecurity: Not on file  Transportation Needs: Not on file  Physical Activity: Not on file  Stress: Not on file  Social Connections: Not on file  Intimate Partner Violence: Not on file    FAMILY HISTORY: Family History  Problem Relation Age of Onset    Hyperlipidemia Mother    Brain cancer Father     ALLERGIES:  has No Known Allergies.  MEDICATIONS:  Current Outpatient Medications  Medication Sig Dispense Refill   bictegravir-emtricitabine-tenofovir AF (BIKTARVY) 50-200-25 MG TABS tablet TAKE 1 TABLET BY MOUTH DAILY. 30 tablet 5   No current facility-administered medications for this visit.     PHYSICAL EXAMINATION: ECOG PERFORMANCE STATUS: 1 - Symptomatic but completely ambulatory Vitals:   08/28/21 1310  BP: 109/68  Pulse: (!) 59  Temp: (!) 97.1 F (36.2 C)   Filed Weights   08/28/21 1310  Weight: 175 lb 8 oz (79.6 kg)    Physical Exam Constitutional:      General: He is not in acute distress. HENT:     Head: Normocephalic and atraumatic.  Eyes:     General: No scleral icterus. Cardiovascular:     Rate and Rhythm: Normal rate and regular rhythm.     Heart sounds: Normal heart sounds.  Pulmonary:     Effort: Pulmonary effort is normal. No respiratory distress.     Breath sounds: No wheezing.  Abdominal:     General: Bowel sounds are normal. There is no distension.     Palpations: Abdomen is soft.  Musculoskeletal:        General: No deformity. Normal range of motion.     Cervical back: Normal range of motion and neck supple.     Comments: No axillary lymphadenopathy palpated.  Skin:    General: Skin is warm and dry.     Findings: No erythema or rash.  Neurological:     Mental Status: He is alert and oriented to person, place, and time. Mental status is at baseline.     Cranial Nerves: No cranial nerve deficit.     Coordination: Coordination normal.  Psychiatric:        Mood and Affect: Mood normal.      LABORATORY DATA:  I have reviewed the data as listed Lab Results  Component Value Date   WBC 5.5 08/28/2021   HGB 13.0 08/28/2021   HCT 38.1 (L) 08/28/2021   MCV 94.5 08/28/2021   PLT 164 08/28/2021   Recent Labs    10/06/20 0410 10/07/20 0630 10/08/20 0426 10/10/20 1018 12/05/20 1434  01/09/21 1044 02/17/21 1501 04/24/21 1054 07/18/21 1013 08/28/21 1255  NA 136 138 140 142 137   < > 138 139 141 137  K 3.5 3.1* 4.1 4.0 3.9   < > 3.8 4.3 4.2 4.2  CL 108 110 113* 108 105   < > 109 106 106 106  CO2 21* 21* _0 < > _1 GLUCOSE 100* 95 101* 114* 126*   < > 91 99 85 117*  BUN 25* _2 24*   < >  30* 20 19 25*  CREATININE 0.87 0.80 0.69 0.82 1.06   < > 1.07 1.17 1.02 0.97  CALCIUM 8.3* 8.3* 8.5* 8.6 9.0   < > 8.7* 9.0 8.9 8.4*  GFRNONAA >60 >60 >60 88 >60  --  >60  --   --  >60  GFRAA  --   --   --  102  --   --   --   --   --   --   PROT 5.7* 5.2* 5.6* 5.6* 6.7   < > 6.8 6.2 6.0* 6.1*  ALBUMIN 2.8* 2.7* 2.8*  --  3.8  --  3.8  --   --  3.2*  AST _0 < > _1 ALT _2 < > _3 ALKPHOS 52 55 66  --  76  --  80  --   --  77  BILITOT 1.1 0.5 0.4 0.3 0.7   < > 0.5 0.4 0.4 0.3  BILIDIR 0.2 <0.1 <0.1  --   --   --   --   --   --   --   IBILI 0.9 NOT CALCULATED NOT CALCULATED  --   --   --   --   --   --   --    < > = values in this interval not displayed.    Iron/TIBC/Ferritin/ %Sat    Component Value Date/Time   IRON 22 (L) 03/17/2020 1029   IRON 17 02/17/2018 0000   TIBC 286 03/17/2020 1029   TIBC 425 02/17/2018 0000   FERRITIN 284 03/17/2020 1029   IRONPCTSAT 8 (L) 03/17/2020 1029   IRONPCTSAT 8 (L) 03/10/2020 1113      RADIOGRAPHIC STUDIES: I have personally reviewed the radiological images as listed and agreed with the findings in the report. No results found.  No results found.     ASSESSMENT & PLAN:  1. 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 Port-A-Cath in place   3. Peripheral neuropathy due to chemotherapy ([place])   4. AIDS (acquired immune deficiency syndrome) ([place])   5. 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 IHC MYC+, BCL 6+, negative translocation.  Status post 4 cycles of dose  adjusted EPOCH with rituximab.  Patient had +1 dose level increase for cycle 2/cycle 3, +2 dose level increase for cycle 4, +1 dose level for cycle 5 and 6. Intrathecal MTX Recommend repeat restaging PET scan. Patient had approval hypermetabolic hypodense lesion adjacent to the right axillary neurovascular bundle.  Previously index left axillary lymph node SUV was 1.5. Discussed with patient that if current CT indicates that he is in complete remission, further images will be obtained as indicated. Labs reviewed and discussed with patient.  Hemoglobin has normalized.  Chronically elevated LDH has normalized-this result comes back after patient's encounter..  # Port Cath in place. Port flush every 8 weeks.   #Chemotherapy-induced neuropathy, patient reports that symptom has improved.  Off gabapentin.  Patient feels that chiropractor treatments are helpful.  # AIDS patient follows with ID physician.   continue antiretroviral therapy  #Decreased albumin level, recommend patient establish with nutritionist.  Patient prefers to defer at this point.   Supportive care measures are necessary for patient well-being and will be provided as necessary. We spent sufficient time to discuss many aspect of care,  questions were answered to patient and wife's satisfaction.     All questions were answered. The patient knows to call the clinic with any problems questions or concerns.  cc Nobie Putnam *  Follow-up in    follow up in 6 months.   Earlie Server, MD, PhD Hematology Oncology  08/28/2021

## 2021-08-28 NOTE — Progress Notes (Signed)
Patient informed that based on labs, nutrition status is low. Offered patient to set up appt with dietitian, but he stated that he will work on it at home and will increase nutrition supplements. Notified him that he may call office to set up appt with dietitian if he changes his mind.

## 2021-09-05 ENCOUNTER — Encounter
Admission: RE | Admit: 2021-09-05 | Discharge: 2021-09-05 | Disposition: A | Discharge status: Home / Self Care | Payer: Medicare HMO | Source: Ambulatory Visit | Attending: Oncology | Admitting: Oncology

## 2021-09-05 ENCOUNTER — Other Ambulatory Visit: Payer: Self-pay

## 2021-09-05 DIAGNOSIS — J984 Other disorders of lung: Secondary | ICD-10-CM | POA: Diagnosis not present

## 2021-09-05 DIAGNOSIS — J189 Pneumonia, unspecified organism: Secondary | ICD-10-CM | POA: Diagnosis not present

## 2021-09-05 DIAGNOSIS — C8510 Unspecified B-cell lymphoma, unspecified site: Secondary | ICD-10-CM | POA: Insufficient documentation

## 2021-09-05 DIAGNOSIS — N281 Cyst of kidney, acquired: Secondary | ICD-10-CM | POA: Diagnosis not present

## 2021-09-05 LAB — GLUCOSE, CAPILLARY: Glucose-Capillary: 91 mg/dL (ref 70–99)

## 2021-09-05 MED ORDER — FLUDEOXYGLUCOSE F - 18 (FDG) INJECTION
9.3700 | Freq: Once | INTRAVENOUS | Status: AC | PRN
  Administered 2021-09-05: 9.37 via INTRAVENOUS

## 2021-09-07 ENCOUNTER — Telehealth: Payer: Self-pay | Admitting: *Deleted

## 2021-09-07 NOTE — Telephone Encounter (Signed)
Patient called asking for interpretation of the results of his PET scan. He does not have follow up appointment until July    IMPRESSION: 1. Diffuse hypermetabolic activity throughout the stomach. Activity is greater than expected for physiologic muscle activity. Recommend correlation with gastritis or lymphoma involvement the stomach. 2. Recurrence of low-density peripherally hypermetabolic lesion in the RIGHT shoulder anterior to the biceps muscle. Indeterminate finding. 3. New segmental nodular airspace disease in the RIGHT upper lobe with intense metabolic activity. Favor focus of pneumonia. Recommend close attention on follow-up. 4. No marrow involvement.  Normal spleen. 5. No abdominopelvic lymphadenopathy.     Electronically Signed   By: Suzy Bouchard M.D.   On: 09/06/2021 14:15

## 2021-09-08 ENCOUNTER — Other Ambulatory Visit: Payer: Self-pay

## 2021-09-08 ENCOUNTER — Telehealth: Payer: Self-pay | Admitting: Oncology

## 2021-09-08 ENCOUNTER — Other Ambulatory Visit: Payer: Self-pay | Admitting: Oncology

## 2021-09-08 ENCOUNTER — Ambulatory Visit (INDEPENDENT_AMBULATORY_CARE_PROVIDER_SITE_OTHER): Payer: Medicare HMO

## 2021-09-08 ENCOUNTER — Encounter: Payer: Self-pay | Admitting: Oncology

## 2021-09-08 DIAGNOSIS — Z23 Encounter for immunization: Secondary | ICD-10-CM

## 2021-09-08 DIAGNOSIS — C8510 Unspecified B-cell lymphoma, unspecified site: Secondary | ICD-10-CM

## 2021-09-08 DIAGNOSIS — Z8579 Personal history of other malignant neoplasms of lymphoid, hematopoietic and related tissues: Secondary | ICD-10-CM

## 2021-09-08 MED ORDER — AZITHROMYCIN 250 MG PO TABS: ORAL_TABLET | ORAL | 0 refills | Status: DC

## 2021-09-08 NOTE — Telephone Encounter (Signed)
Dr. Tasia Catchings has contacted patient and reviewed resutls and follow up plan with him.   Referral placed to GI for EGD. History of high grade lymphoma, treated, PET showed incrased activity.

## 2021-09-08 NOTE — Telephone Encounter (Signed)
Called patient to dicussed PET scan. Recommend GI referral for evaluation of increased activity in stomach.  Recent URI. PET showed lung activity presumed due to infection. Send RX of Z-Pak. Advise him to update his ID physician.

## 2021-09-11 ENCOUNTER — Telehealth: Payer: Self-pay

## 2021-09-11 NOTE — Telephone Encounter (Signed)
Patient has a cold and is coughing and needing to move his procedure to 10/03/21. Called trish and got patient moved.

## 2021-09-12 ENCOUNTER — Other Ambulatory Visit (HOSPITAL_COMMUNITY): Payer: Self-pay

## 2021-09-14 ENCOUNTER — Other Ambulatory Visit (HOSPITAL_COMMUNITY): Payer: Self-pay

## 2021-09-22 ENCOUNTER — Other Ambulatory Visit (HOSPITAL_COMMUNITY): Payer: Self-pay

## 2021-09-22 ENCOUNTER — Ambulatory Visit: Payer: Medicare HMO

## 2021-10-02 ENCOUNTER — Telehealth: Payer: Self-pay

## 2021-10-02 NOTE — Telephone Encounter (Signed)
Spoke to patient and he states he is feeling ok, no concerns or problems voiced. He is scheduled for EGD tomorrow.  ?

## 2021-10-02 NOTE — Telephone Encounter (Signed)
-----   Message from Earlie Server, MD sent at 10/01/2021  3:52 PM EST ----- ?Please follow up on him to see how he feels after finishing antibiotics.  ?Also check if he has appt with Dr.Anna for EGD.  ?Thanks.  ? ?

## 2021-10-03 ENCOUNTER — Encounter
Admission: RE | Disposition: A | Discharge status: Home / Self Care | Payer: Self-pay | Source: Home / Self Care | Attending: Gastroenterology

## 2021-10-03 ENCOUNTER — Other Ambulatory Visit: Payer: Self-pay

## 2021-10-03 ENCOUNTER — Ambulatory Visit
Admission: RE | Admit: 2021-10-03 | Discharge: 2021-10-03 | Disposition: A | Discharge status: Home / Self Care | Payer: Medicare HMO | Attending: Gastroenterology | Admitting: Gastroenterology

## 2021-10-03 ENCOUNTER — Ambulatory Visit: Payer: Medicare HMO | Admitting: Certified Registered Nurse Anesthetist

## 2021-10-03 ENCOUNTER — Encounter: Payer: Self-pay | Admitting: Gastroenterology

## 2021-10-03 DIAGNOSIS — K2980 Duodenitis without bleeding: Secondary | ICD-10-CM

## 2021-10-03 DIAGNOSIS — K900 Celiac disease: Secondary | ICD-10-CM | POA: Insufficient documentation

## 2021-10-03 DIAGNOSIS — R935 Abnormal findings on diagnostic imaging of other abdominal regions, including retroperitoneum: Secondary | ICD-10-CM

## 2021-10-03 DIAGNOSIS — K2950 Unspecified chronic gastritis without bleeding: Secondary | ICD-10-CM | POA: Insufficient documentation

## 2021-10-03 DIAGNOSIS — Z8572 Personal history of non-Hodgkin lymphomas: Secondary | ICD-10-CM | POA: Insufficient documentation

## 2021-10-03 DIAGNOSIS — R933 Abnormal findings on diagnostic imaging of other parts of digestive tract: Secondary | ICD-10-CM | POA: Insufficient documentation

## 2021-10-03 DIAGNOSIS — K3189 Other diseases of stomach and duodenum: Secondary | ICD-10-CM | POA: Insufficient documentation

## 2021-10-03 DIAGNOSIS — Z8579 Personal history of other malignant neoplasms of lymphoid, hematopoietic and related tissues: Secondary | ICD-10-CM

## 2021-10-03 DIAGNOSIS — E785 Hyperlipidemia, unspecified: Secondary | ICD-10-CM | POA: Diagnosis not present

## 2021-10-03 DIAGNOSIS — K2990 Gastroduodenitis, unspecified, without bleeding: Secondary | ICD-10-CM | POA: Diagnosis not present

## 2021-10-03 HISTORY — PX: ESOPHAGOGASTRODUODENOSCOPY (EGD) WITH PROPOFOL: SHX5813

## 2021-10-03 SURGERY — ESOPHAGOGASTRODUODENOSCOPY (EGD) WITH PROPOFOL
Anesthesia: General

## 2021-10-03 MED ORDER — PROPOFOL 10 MG/ML IV BOLUS
INTRAVENOUS | Status: DC | PRN
  Administered 2021-10-03: 70 mg via INTRAVENOUS

## 2021-10-03 MED ORDER — SODIUM CHLORIDE 0.9 % IV SOLN
INTRAVENOUS | Status: DC
  Administered 2021-10-03: 1000 mL via INTRAVENOUS

## 2021-10-03 MED ORDER — PHENYLEPHRINE HCL-NACL 20-0.9 MG/250ML-% IV SOLN
INTRAVENOUS | Status: AC
  Filled 2021-10-03: qty 250

## 2021-10-03 MED ORDER — LIDOCAINE HCL (CARDIAC) PF 100 MG/5ML IV SOSY
PREFILLED_SYRINGE | INTRAVENOUS | Status: DC | PRN
  Administered 2021-10-03: 80 mg via INTRAVENOUS

## 2021-10-03 MED ORDER — PROPOFOL 500 MG/50ML IV EMUL
INTRAVENOUS | Status: AC
  Filled 2021-10-03: qty 50

## 2021-10-03 MED ORDER — GLYCOPYRROLATE 0.2 MG/ML IJ SOLN
INTRAMUSCULAR | Status: DC | PRN
  Administered 2021-10-03: .2 mg via INTRAVENOUS

## 2021-10-03 MED ORDER — PROPOFOL 500 MG/50ML IV EMUL
INTRAVENOUS | Status: DC | PRN
  Administered 2021-10-03: 150 ug/kg/min via INTRAVENOUS

## 2021-10-03 MED ORDER — PROPOFOL 500 MG/50ML IV EMUL
INTRAVENOUS | Status: AC
  Filled 2021-10-03: qty 100

## 2021-10-03 NOTE — Anesthesia Procedure Notes (Signed)
Date/Time: 10/03/2021 8:08 AM ?Performed by: Lily Peer, Kelvyn Schunk, CRNA ?Pre-anesthesia Checklist: Patient identified, Emergency Drugs available, Suction available, Patient being monitored and Timeout performed ?Patient Re-evaluated:Patient Re-evaluated prior to induction ?Oxygen Delivery Method: Simple face mask ?Induction Type: IV induction ? ? ? ? ?

## 2021-10-03 NOTE — H&P (Signed)
? ? ? ?  Jonathon Bellows, MD ?[address], [place], [place], [place], [address] ?[address] [place], [place], [place], [address] ?Phone: [phone]  ?Fax: [phone] ? ?Primary Care Physician:  Olin Hauser, DO ? ? ?Pre-Procedure History & Physical: ?HPI:  Justin Wallace is a 76 y.o. male is here for an endoscopy  ?  ?Past Medical History:  ?Diagnosis Date  ? Celiac disease   ? 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 Lyme disease   ? Lymphoma ([place])   ? ? ?Past Surgical History:  ?Procedure Laterality Date  ? PORTA CATH INSERTION N/A 04/25/2020  ? Procedure: PORTA CATH INSERTION;  Surgeon: Algernon Huxley, MD;  Location: [facility];  Service: Cardiovascular;  Laterality: N/A;  ? ? ?Prior to Admission medications   ?Medication Sig Start Date End Date Taking? Authorizing Provider  ?bictegravir-emtricitabine-tenofovir AF (BIKTARVY) 50-200-25 MG TABS tablet TAKE 1 TABLET BY MOUTH DAILY. 05/15/21  Yes Mignon Pine, DO  ?azithromycin (ZITHROMAX) 250 MG tablet Take 2 tablets on day 1 and for Day 2-Day 5  take 1 tablet daily 09/08/21   Earlie Server, MD  ? ? ?Allergies as of 09/08/2021  ? (No Known Allergies)  ? ? ?Family History  ?Problem Relation Age of Onset  ? Hyperlipidemia Mother   ? Brain cancer Father   ? ? ?Social History  ? ?Socioeconomic History  ? Marital status: Married  ?  Spouse name: Opal Sidles   ? Number of children: 2  ? Years of education: Not on file  ? Highest education level: Not on file  ?Occupational History  ? Not on file  ?Tobacco Use  ? Smoking status: Never  ? Smokeless tobacco: Never  ?Vaping Use  ? Vaping Use: Never used  ?Substance and Sexual Activity  ? Alcohol use: Yes  ?  Comment: occasional  ? Drug use: Never  ? Sexual activity: Never  ?Other Topics Concern  ? Not on file  ?Social History Narrative  ? Not on file  ? ?Social Determinants of Health  ? ?Financial Resource Strain: Not on file  ?Food Insecurity: Not on file   ?Transportation Needs: Not on file  ?Physical Activity: Not on file  ?Stress: Not on file  ?Social Connections: Not on file  ?Intimate Partner Violence: Not on file  ? ? ?Review of Systems: ?See HPI, otherwise negative ROS ? ?Physical Exam: ?BP 97/66   Pulse 65   Temp (!) 96.4 ?F (35.8 ?C) (Temporal)   Resp (!) 28   Ht 5' 9"  (1.753 m)   Wt 72.7 kg   SpO2 99%   BMI 23.67 kg/m?  ?General:   Alert,  pleasant and cooperative in NAD ?Head:  Normocephalic and atraumatic. ?Neck:  Supple; no masses or thyromegaly. ?Lungs:  Clear throughout to auscultation, normal respiratory effort.    ?Heart:  +S1, +S2, Regular rate and rhythm, No edema. ?Abdomen:  Soft, nontender and nondistended. Normal bowel sounds, without guarding, and without rebound.   ?Neurologic:  Alert and  oriented x4;  grossly normal neurologically. ? ?Impression/Plan: ?Justin Wallace is here for an endoscopy  to be performed for  evaluation of abnormal PET scan  ?   ?Risks, benefits, limitations, and alternatives regarding endoscopy have been reviewed with the patient.  Questions have been answered.  All parties agreeable. ? ? ?Jonathon Bellows, MD  10/03/2021, 3:51 PM ? ?

## 2021-10-03 NOTE — Anesthesia Postprocedure Evaluation (Signed)
Anesthesia Post Note ? ?Patient: Justin Wallace ? ?Procedure(s) Performed: ESOPHAGOGASTRODUODENOSCOPY (EGD) WITH PROPOFOL ? ?Patient location during evaluation: Endoscopy ?Anesthesia Type: General ?Level of consciousness: awake and alert ?Pain management: pain level controlled ?Vital Signs Assessment: post-procedure vital signs reviewed and stable ?Respiratory status: spontaneous breathing, nonlabored ventilation, respiratory function stable and patient connected to nasal cannula oxygen ?Cardiovascular status: blood pressure returned to baseline and stable ?Postop Assessment: no apparent nausea or vomiting ?Anesthetic complications: no ? ? ?No notable events documented. ? ? ?Last Vitals:  ?Vitals:  ? 10/03/21 0818 10/03/21 0819  ?BP: 97/66   ?Pulse:    ?Resp:  (!) 28  ?Temp:  (!) 35.8 ?C  ?SpO2:  99%  ?  ?Last Pain:  ?Vitals:  ? 10/03/21 0843  ?TempSrc:   ?PainSc: 0-No pain  ? ? ?  ?  ?  ?  ?  ?  ? ?Martha Clan ? ? ? ? ?

## 2021-10-03 NOTE — Transfer of Care (Signed)
Immediate Anesthesia Transfer of Care Note ? ?Patient: Justin Wallace ? ?Procedure(s) Performed: ESOPHAGOGASTRODUODENOSCOPY (EGD) WITH PROPOFOL ? ?Patient Location: Endoscopy Unit ? ?Anesthesia Type:General ? ?Level of Consciousness: drowsy ? ?Airway & Oxygen Therapy: Patient Spontanous Breathing ? ?Post-op Assessment: Report given to RN and Post -op Vital signs reviewed and stable ? ?Post vital signs: Reviewed and stable ? ?Last Vitals:  ?Vitals Value Taken Time  ?BP 97/66 10/03/21 0818  ?Temp    ?Pulse 75 10/03/21 0818  ?Resp 33 10/03/21 0818  ?SpO2 96 % 10/03/21 0818  ?Vitals shown include unvalidated device data. ? ?Last Pain:  ?Vitals:  ? 10/03/21 0817  ?TempSrc:   ?PainSc: 0-No pain  ?   ? ?  ? ?Complications: No notable events documented. ?

## 2021-10-03 NOTE — Op Note (Signed)
[facility] ?Gastroenterology ?Patient Name: Justin Wallace ?Procedure Date: 10/03/2021 8:05 AM ?MRN: [id number] ?Account #: [id number] ?Date of Birth: 10-Sep-1945 ?Admit Type: Outpatient ?Age: 76 ?Room: [facility] ENDO ROOM 2 ?Gender: Male ?Note Status: Finalized ?Instrument Name: Upper Endoscope [id number] ?Procedure:             Upper GI endoscopy ?Indications:           Abnormal PET scan of the GI tract ?Providers:             Jonathon Bellows MD, MD ?Medicines:             Monitored Anesthesia Care ?Complications:         No immediate complications. ?Procedure:             Pre-Anesthesia Assessment: ?                       - Prior to the procedure, a History and Physical was  ?                       performed, and patient medications, allergies and  ?                       sensitivities were reviewed. The patient's tolerance  ?                       of previous anesthesia was reviewed. ?                       - The risks and benefits of the procedure and the  ?                       sedation options and risks were discussed with the  ?                       patient. All questions were answered and informed  ?                       consent was obtained. ?                       - ASA Grade Assessment: II - A patient with mild  ?                       systemic disease. ?                       After obtaining informed consent, the endoscope was  ?                       passed under direct vision. Throughout the procedure,  ?                       the patient's blood pressure, pulse, and oxygen  ?                       saturations were monitored continuously. The Endoscope  ?                       was introduced through the mouth, and advanced to the  ?  third part of duodenum. The upper GI endoscopy was  ?                       accomplished with ease. The patient tolerated the  ?                       procedure well. ?Findings: ?     The esophagus was normal. ?     Diffuse moderate mucosal changes  characterized by congestion and  ?     nodularity were found on the greater curvature of the stomach, in the  ?     gastric antrum and in the prepyloric region of the stomach. Biopsies  ?     were taken with a cold forceps for histology. ?     Localized nodular mucosa was found in the first portion of the duodenum.  ?     Biopsies were taken with a cold forceps for histology. ?     The cardia and gastric fundus were normal on retroflexion. ?     The exam was otherwise without abnormality. ?Impression:            - Normal esophagus. ?                       - Congested and nodular mucosa in the greater  ?                       curvature, antrum and prepyloric region of the  ?                       stomach. Biopsied. ?                       - Nodular mucosa in the first portion of the duodenum.  ?                       Biopsied. ?                       - The examination was otherwise normal. ?Recommendation:        - Await pathology results. ?                       - Discharge patient to home (with escort). ?                       - Resume previous diet. ?                       - Continue present medications. ?                       - Return to my office PRN. ?Procedure Code(s):     --- Professional --- ?                       [phone], Esophagogastroduodenoscopy, flexible,  ?                       transoral; with biopsy, single or multiple ?Diagnosis Code(s):     --- Professional --- ?  K31.89, Other diseases of stomach and duodenum ?                       R93.3, Abnormal findings on diagnostic imaging of  ?                       other parts of digestive tract ?CPT copyright 2019 American Medical Association. All rights reserved. ?The codes documented in this report are preliminary and upon coder review may  ?be revised to meet current compliance requirements. ?Jonathon Bellows, MD ?Jonathon Bellows MD, MD ?10/03/2021 8:18:42 AM ?This report has been signed electronically. ?Number of Addenda: 0 ?Note Initiated On:  10/03/2021 8:05 AM ?Estimated Blood Loss:  Estimated blood loss: none. ?     [facility] ?

## 2021-10-03 NOTE — Anesthesia Preprocedure Evaluation (Signed)
Anesthesia Evaluation  ?Patient identified by MRN, date of birth, ID band ?Patient awake ? ? ? ?Reviewed: ?Allergy & Precautions, H&P , NPO status , Patient's Chart, lab work & pertinent test results, reviewed documented beta blocker date and time  ? ?History of Anesthesia Complications ?Negative for: history of anesthetic complications ? ?Airway ?Mallampati: II ? ?TM Distance: >3 FB ?Neck ROM: full ? ? ? Dental ? ?(+) Dental Advidsory Given, Poor Dentition, Missing, Chipped ?  ?Pulmonary ?neg pulmonary ROS,  ?  ?Pulmonary exam normal ?breath sounds clear to auscultation ? ? ? ? ? ? Cardiovascular ?Exercise Tolerance: Good ?negative cardio ROS ?Normal cardiovascular exam ?Rhythm:regular Rate:Normal ? ? ?  ?Neuro/Psych ?neg Seizures  Neuromuscular disease (neuropathy) negative psych ROS  ? GI/Hepatic ?negative GI ROS, Neg liver ROS,   ?Endo/Other  ?negative endocrine ROS ? Renal/GU ?negative Renal ROS  ?negative genitourinary ?  ?Musculoskeletal ? ? Abdominal ?  ?Peds ? Hematology ? 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 ? Reproductive/Obstetrics ?negative OB ROS ? ?  ? ? ? ? ? ? ? ? ? ? ? ? ? ?  ?  ? ? ? ? ? ? ? ? ?Anesthesia Physical ?Anesthesia Plan ? ?ASA: 3 ? ?Anesthesia Plan: General  ? ?Post-op Pain Management:   ? ?Induction: Intravenous ? ?PONV Risk Score and Plan: 2 and Propofol infusion and TIVA ? ?Airway Management Planned: Natural Airway and Nasal Cannula ? ?Additional Equipment:  ? ?Intra-op Plan:  ? ?Post-operative Plan:  ? ?Informed Consent: I have reviewed the patients History and Physical, chart, labs and discussed the procedure including the risks, benefits and alternatives for the proposed anesthesia with the patient or authorized  representative who has indicated his/her understanding and acceptance.  ? ? ? ?Dental Advisory Given ? ?Plan Discussed with: Anesthesiologist, CRNA and Surgeon ? ?Anesthesia Plan Comments:   ? ? ? ? ? ? ?Anesthesia Quick Evaluation ? ?

## 2021-10-04 ENCOUNTER — Encounter: Payer: Self-pay | Admitting: Gastroenterology

## 2021-10-05 LAB — SURGICAL PATHOLOGY

## 2021-10-06 IMAGING — PT NM PET TUM IMG RESTAG (PS) SKULL BASE T - THIGH
2 series · 5 of 5 positions shown · non-contrast
Comparison: None.

CLINICAL DATA: Subsequent treatment strategy for high-grade
lymphoma. B-cell lymphoma.

EXAM:
NUCLEAR MEDICINE PET SKULL BASE TO THIGH
TECHNIQUE: 8.8 mCi F-18 FDG was injected intravenously. Full-ring PET imaging
was performed from the skull base to thigh after the radiotracer. CT
data was obtained and used for attenuation correction and anatomic
localization.
Fasting blood glucose: 93 mg/dl

[Series 1282: results mm oncology reading · 1.0mm · 1.19mm/px · 4 of 4 slices shown (1 of 2)]
[im 1/4]
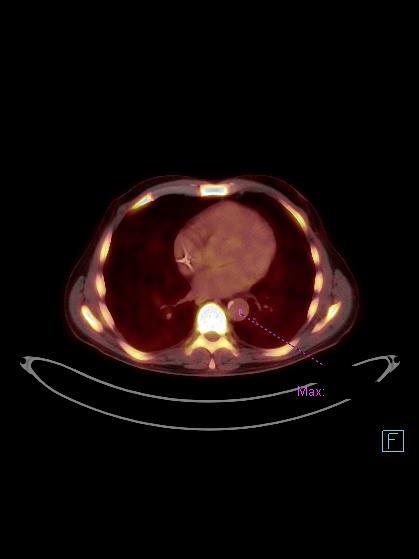
[im 2/4]
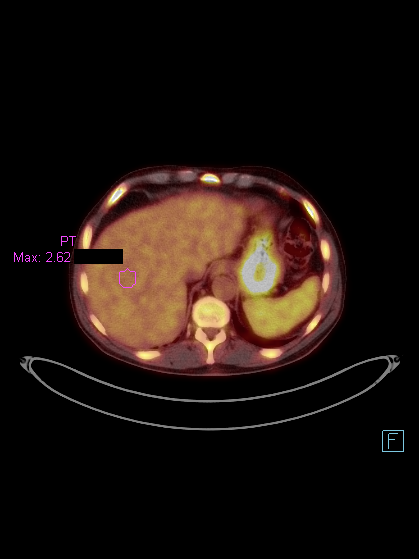
[im 3/4]
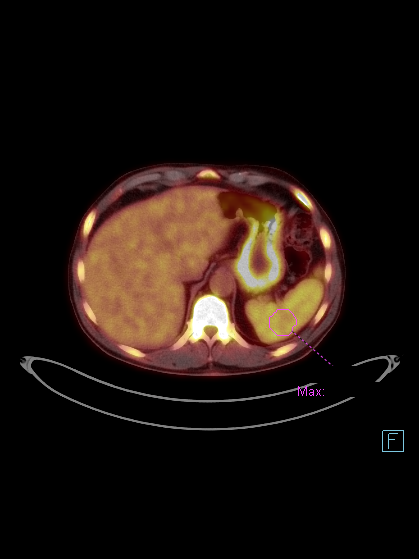
[im 4/4]
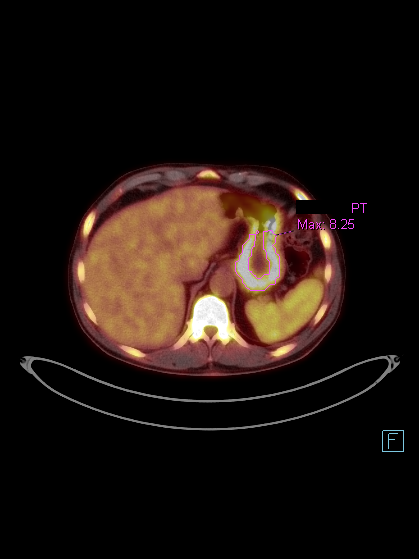

[Series 1455: results mm oncology reading · 5.0mm · 0.95mm/px · 1 of 1 slices shown (2 of 2)]
[im 1/1]
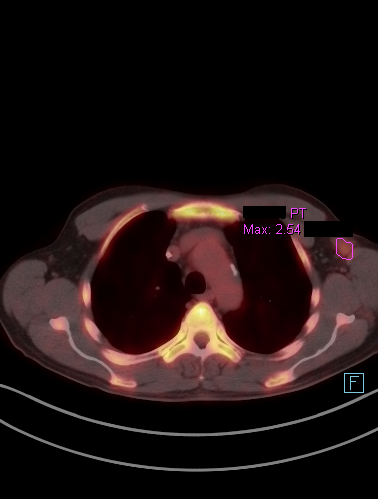

[5 of 5 positions shown; findings below may reference images not displayed]

FINDINGS: Mediastinal blood pool activity: SUV max

Liver activity: SUV max

NECK: No hypermetabolic lymph nodes in the neck.

Incidental CT findings: none

CHEST: Differing positioning of the arms compared to prior. The
large LEFT axillary mass seen on comparison exam is reduced in size
and metabolic activity measuring 2.0 x 1.2 cm compared to 5.0 x
cm. The activity of the mass is markedly reduced with SUV max equal
2.5 decreased from SUV max equal 28.1. The position of the axillary
mass is change related to arm positioning.

No additional hypermetabolic axillary nodes. No hypermetabolic
mediastinal nodes.

Incidental CT findings: No suspicious pulmonary nodules. Port in the
anterior chest wall with tip in distal SVC.

ABDOMEN/PELVIS: Spleen is increased in metabolic activity compared
to prior with SUV max 4.1. Spleen is normal size and not changed
from prior. Favor increased metabolic activity related to GCSF type
response.

No hypermetabolic abdominopelvic lymph nodes. No enlarged
abdominopelvic nodes. Normal liver.

There is diffuse activity within the gastric wall which is intense
with SUV max equal 8.2.

Incidental CT findings: None

SKELETON: Diffuse uniform intense activity within the entire marrow
space consistent GCSF type response.

Incidental CT findings: none
IMPRESSION: 1. Marked reduction in size and metabolic activity of the LEFT
axillary lymph node/mass with mild residual metabolic activity (
[HOSPITAL] 3).
2. No new hypermetabolic lymph nodes on skull base to thigh scan.
3. Increased metabolic activity normal volume spleen is favored GCSF
S type response.
4. Intense hypermetabolic activity throughout the stomach is favor
gastritis.
5. Uniform increase in marrow activity consistent wit GCSF type
response.

## 2021-10-16 ENCOUNTER — Other Ambulatory Visit (HOSPITAL_COMMUNITY): Payer: Self-pay

## 2021-10-17 ENCOUNTER — Telehealth: Payer: Self-pay

## 2021-10-17 NOTE — Telephone Encounter (Signed)
Patient came in to the office and wanted the results of his EGD. Patient also stated that nobody called him and he continues to have dysphagia. Patient also stated that he had a PET Scan done and that was the reason that an EGD was ordered to find out what would be found on his EGD. Patient stated that he wanted answers. Please advise. Should he contact his [place] doctor-Dr. Tasia Catchings? ?

## 2021-10-17 NOTE — Telephone Encounter (Signed)
Patient was contacted and he understood and agreed on coming in to discuss results and what the next steps will be. Appointment is for tomorrow 10/18/2021 at 2:15 PM. ?

## 2021-10-18 ENCOUNTER — Other Ambulatory Visit: Payer: Self-pay

## 2021-10-18 ENCOUNTER — Ambulatory Visit: Payer: Medicare HMO | Admitting: Gastroenterology

## 2021-10-18 ENCOUNTER — Encounter: Payer: Self-pay | Admitting: Gastroenterology

## 2021-10-18 VITALS — BP 117/65 | HR 70 | Temp 98.3°F | Wt 169.0 lb

## 2021-10-18 DIAGNOSIS — Z8719 Personal history of other diseases of the digestive system: Secondary | ICD-10-CM | POA: Diagnosis not present

## 2021-10-18 DIAGNOSIS — K2960 Other gastritis without bleeding: Secondary | ICD-10-CM

## 2021-10-18 DIAGNOSIS — R1084 Generalized abdominal pain: Secondary | ICD-10-CM | POA: Diagnosis not present

## 2021-10-18 DIAGNOSIS — Z8579 Personal history of other malignant neoplasms of lymphoid, hematopoietic and related tissues: Secondary | ICD-10-CM

## 2021-10-18 NOTE — Progress Notes (Signed)
?  ?Jonathon Bellows MD, MRCP(U.K) ?[address]  ?[address]  ?[place], [place] [address]  ?Main: [phone]  ?Fax: [phone] ? ? ?Gastroenterology Consultation ? ?Referring Provider:     Nobie Putnam * ?Primary Care Physician:  Olin Hauser, DO ?Primary Gastroenterologist:  Dr. Jonathon Bellows  ?Reason for Consultation:     Discuss results of recent upper endoscopy ?      ? HPI:   ?Justin Wallace is a 76 y.o. y/o male referred for consultation & management  by Dr. Parks Ranger, Devonne Doughty, DO.   ? 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 Tasia Catchings in oncology.  Recently underwent a PET scan in February 2023 and found that there was diffuse hypermetabolic activity throughout the stomach recommended an EGD that I performed on 10/03/2021.  Endoscopically noted diffuse moderate mucosal changes consisting of congestion and nodularity was seen in the greater curvature the stomach, gastric antrum and prepyloric area biopsies were taken biopsies also were taken from the duodenal bulb.  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 ? ?He states he has a history of celiac disease is on a gluten-free diet and has been pretty consistent with his diet denies any GI symptoms at this point of time denies any abdominal pain. ? ? ?Past Medical History:  ?Diagnosis Date  ? Celiac disease   ? 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 HIV (human immunodeficiency virus infection) ([place])   ? Lyme disease   ? Lymphoma ([place])   ? ? ?Past Surgical History:  ?Procedure Laterality Date  ? ESOPHAGOGASTRODUODENOSCOPY (EGD) WITH PROPOFOL N/A 10/03/2021  ? Procedure: ESOPHAGOGASTRODUODENOSCOPY (EGD) WITH PROPOFOL;  Surgeon: Jonathon Bellows, MD;  Location: [facility] ENDOSCOPY;  Service: Gastroenterology;  Laterality: N/A;  ? PORTA CATH INSERTION N/A 04/25/2020  ? Procedure: PORTA  CATH INSERTION;  Surgeon: Algernon Huxley, MD;  Location: [facility];  Service: Cardiovascular;  Laterality: N/A;  ? ? ?Prior to Admission medications   ?Medication Sig Start Date End Date Taking? Authorizing Provider  ?azithromycin (ZITHROMAX) 250 MG tablet Take 2 tablets on day 1 and for Day 2-Day 5  take 1 tablet daily 09/08/21   Earlie Server, MD  ?bictegravir-emtricitabine-tenofovir AF (BIKTARVY) 50-200-25 MG TABS tablet TAKE 1 TABLET BY MOUTH DAILY. 05/15/21   Mignon Pine, DO  ? ? ?Family History  ?Problem Relation Age of Onset  ? Hyperlipidemia Mother   ? Brain cancer Father   ?  ? ?Social History  ? ?Tobacco Use  ? Smoking status: Never  ? Smokeless tobacco: Never  ?Vaping Use  ? Vaping Use: Never used  ?Substance Use Topics  ? Alcohol use: Yes  ?  Comment: occasional  ? Drug use: Never  ? ? ?Allergies as of 10/18/2021  ? (No Known Allergies)  ? ? ?Review of Systems:    ?All systems reviewed and negative except where noted in HPI. ? ? Physical Exam:  ?There were no vitals taken for this visit. ?No LMP for male patient. ?Psych:  Alert and cooperative. Normal mood and affect. ?General:   Alert,  Well-developed, well-nourished, pleasant and cooperative in NAD ?Head:  Normocephalic and atraumatic. ?Eyes:  Sclera clear, no icterus.   Conjunctiva pink.  ?Neurologic:  Alert and oriented x3;  grossly normal neurologically. ?Psych:  Alert and cooperative. Normal mood and affect. ? ?Imaging Studies: ?No  results found. ? ?Assessment and Plan:  ? ?Justin Wallace is a 76 y.o. y/o male has been referred for an abnormal PET scan that showed abnormality in the stomach.  I performed an endoscopy and noted nodularity in his greater curvature and antrum that I biopsied that revealed Lymphocytic gastritis.  This is a condition that could be secondary to HIV, celiac disease and H. pylori it can also be etiopathic.  He does have HIV as well as celiac disease. ? ?Plan ?1.  Check H. pylori breath test ?2.  Celiac serology to  check if he has been gluten compliant and has not had any inadvertent contamination of his food. ?3.  Next visit we will discuss about healthcare maintenance for celiac disease ? ? ?Follow up in 3 months ? ?Dr Jonathon Bellows MD,MRCP(U.K) ? ?

## 2021-10-19 ENCOUNTER — Other Ambulatory Visit (HOSPITAL_COMMUNITY): Payer: Self-pay

## 2021-10-19 ENCOUNTER — Inpatient Hospital Stay: Payer: Medicare HMO | Attending: Oncology

## 2021-10-19 ENCOUNTER — Inpatient Hospital Stay: Payer: Medicare HMO

## 2021-10-19 DIAGNOSIS — Z95828 Presence of other vascular implants and grafts: Secondary | ICD-10-CM

## 2021-10-19 DIAGNOSIS — C8510 Unspecified B-cell lymphoma, unspecified site: Secondary | ICD-10-CM | POA: Insufficient documentation

## 2021-10-19 DIAGNOSIS — Z452 Encounter for adjustment and management of vascular access device: Secondary | ICD-10-CM | POA: Insufficient documentation

## 2021-10-19 LAB — H. PYLORI BREATH TEST: H pylori Breath Test: NEGATIVE

## 2021-10-19 MED ORDER — HEPARIN SOD (PORK) LOCK FLUSH 100 UNIT/ML IV SOLN
500.0000 [IU] | Freq: Once | INTRAVENOUS | Status: AC
  Administered 2021-10-19: 500 [IU] via INTRAVENOUS
  Filled 2021-10-19: qty 5

## 2021-10-19 MED ORDER — SODIUM CHLORIDE 0.9% FLUSH
10.0000 mL | INTRAVENOUS | Status: DC | PRN
  Administered 2021-10-19: 10 mL via INTRAVENOUS
  Filled 2021-10-19: qty 10

## 2021-10-22 LAB — CELIAC DISEASE PANEL
Endomysial IgA: NEGATIVE
IgA/Immunoglobulin A, Serum: 198 mg/dL (ref 61–437)
Transglutaminase IgA: <2 U/mL (ref 0–3)

## 2021-11-10 IMAGING — RF DG FLUORO GUIDE SPINAL/SI JT INJ*L*
1 series · 1 of 1 positions shown · non-contrast
Comparison: none

CLINICAL DATA: Intrathecal methotrexate for high-grade B-cell
lymphoma

[Series 1: cp_standard · 0.27mm/px · 1 of 1 slices shown]
[im 1/1]
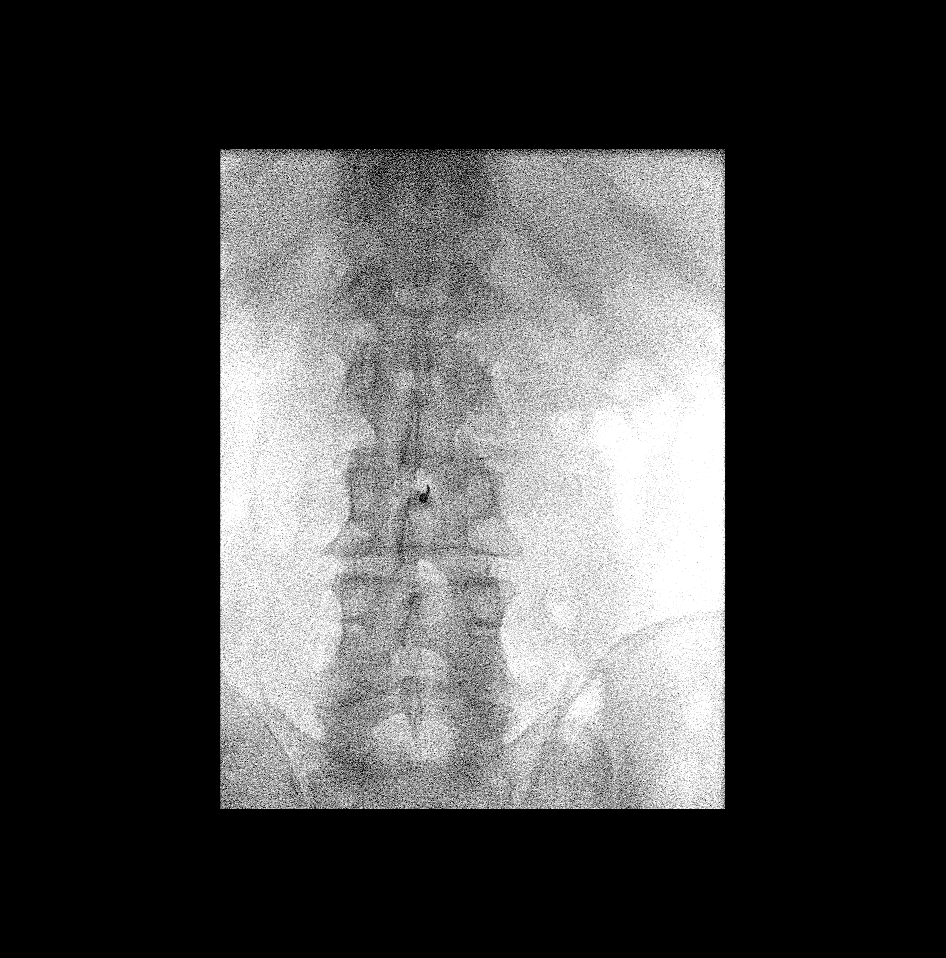

[1 of 1 positions shown; findings below may reference images not displayed]

EXAM:
DIAGNOSTIC LUMBAR PUNCTURE UNDER FLUOROSCOPIC GUIDANCE

FLUOROSCOPY TIME:  Fluoroscopy Time:  0.2 minute

Radiation Exposure Index (if provided by the fluoroscopic device):
0.2 mGy

Number of Acquired Spot Images: 0

PROCEDURE:
Informed consent was obtained from the patient prior to the
procedure, including potential complications of headache, allergy,
and pain. With the patient prone, the lower back was prepped with
Betadine. 1% Lidocaine was used for local anesthesia. Lumbar
puncture was performed at the L2-3 level using a 22 gauge needle
with return of clear CSF. 12 mg of methotrexate was injected into
the subarachnoid space. The patient tolerated the procedure well and
there were no apparent complications.

No immediate complications.
IMPRESSION: Successful fluoroscopic guided lumbar puncture for intrathecal
chemotherapy.

## 2021-11-13 ENCOUNTER — Other Ambulatory Visit (HOSPITAL_COMMUNITY): Payer: Self-pay

## 2021-11-13 ENCOUNTER — Other Ambulatory Visit: Payer: Self-pay | Admitting: Internal Medicine

## 2021-11-13 MED ORDER — BIKTARVY 50-200-25 MG PO TABS
1.0000 | ORAL_TABLET | Freq: Every day | ORAL | 5 refills | Status: DC
  Filled 2021-11-13: qty 30, 30d supply, fill #0
  Filled 2021-12-26: qty 30, 30d supply, fill #1
  Filled 2022-01-19: qty 30, 30d supply, fill #2
  Filled 2022-02-16: qty 30, 30d supply, fill #3
  Filled 2022-03-19: qty 30, 30d supply, fill #4
  Filled 2022-04-17: qty 30, 30d supply, fill #5

## 2021-11-14 ENCOUNTER — Other Ambulatory Visit: Payer: Self-pay

## 2021-11-14 ENCOUNTER — Telehealth: Payer: Self-pay

## 2021-11-14 DIAGNOSIS — C8510 Unspecified B-cell lymphoma, unspecified site: Secondary | ICD-10-CM

## 2021-11-14 NOTE — Telephone Encounter (Signed)
Per Dr. Tasia Catchings would like to follow up with patient. Please schedule patient for labs/MD next available. Please notify patient of follow up appt. Thanks.  ?

## 2021-11-15 NOTE — Telephone Encounter (Signed)
Hey, can you schedule patient for labs/MD next available please.  ?

## 2021-11-24 ENCOUNTER — Other Ambulatory Visit (HOSPITAL_COMMUNITY): Payer: Self-pay

## 2021-12-11 ENCOUNTER — Encounter: Payer: Self-pay | Admitting: Oncology

## 2021-12-11 ENCOUNTER — Inpatient Hospital Stay: Payer: Medicare HMO | Attending: Oncology

## 2021-12-11 ENCOUNTER — Inpatient Hospital Stay: Payer: Medicare HMO | Admitting: Oncology

## 2021-12-11 VITALS — BP 106/66 | HR 62 | Temp 97.5°F | Resp 16 | Ht 69.0 in | Wt 168.7 lb

## 2021-12-11 DIAGNOSIS — Z452 Encounter for adjustment and management of vascular access device: Secondary | ICD-10-CM | POA: Diagnosis not present

## 2021-12-11 DIAGNOSIS — G620 Drug-induced polyneuropathy: Secondary | ICD-10-CM | POA: Diagnosis not present

## 2021-12-11 DIAGNOSIS — C8510 Unspecified B-cell lymphoma, unspecified site: Secondary | ICD-10-CM

## 2021-12-11 DIAGNOSIS — B20 Human immunodeficiency virus [HIV] disease: Secondary | ICD-10-CM | POA: Diagnosis not present

## 2021-12-11 DIAGNOSIS — L989 Disorder of the skin and subcutaneous tissue, unspecified: Secondary | ICD-10-CM | POA: Diagnosis not present

## 2021-12-11 DIAGNOSIS — R222 Localized swelling, mass and lump, trunk: Secondary | ICD-10-CM | POA: Insufficient documentation

## 2021-12-11 DIAGNOSIS — Z95828 Presence of other vascular implants and grafts: Secondary | ICD-10-CM

## 2021-12-11 DIAGNOSIS — A6920 Lyme disease, unspecified: Secondary | ICD-10-CM | POA: Insufficient documentation

## 2021-12-11 DIAGNOSIS — R2231 Localized swelling, mass and lump, right upper limb: Secondary | ICD-10-CM

## 2021-12-11 DIAGNOSIS — T451X5A Adverse effect of antineoplastic and immunosuppressive drugs, initial encounter: Secondary | ICD-10-CM | POA: Diagnosis not present

## 2021-12-11 LAB — CBC WITH DIFFERENTIAL/PLATELET
Abs Immature Granulocytes: 0.01 10*3/uL (ref 0.00–0.07)
Basophils Absolute: 0.1 10*3/uL (ref 0.0–0.1)
Basophils Relative: 1 %
Eosinophils Absolute: 0.1 10*3/uL (ref 0.0–0.5)
Eosinophils Relative: 2 %
HCT: 38.4 % — ABNORMAL LOW (ref 39.0–52.0)
Hemoglobin: 13.1 g/dL (ref 13.0–17.0)
Immature Granulocytes: 0 %
Lymphocytes Relative: 39 %
Lymphs Abs: 2.1 10*3/uL (ref 0.7–4.0)
MCH: 31.3 pg (ref 26.0–34.0)
MCHC: 34.1 g/dL (ref 30.0–36.0)
MCV: 91.9 fL (ref 80.0–100.0)
Monocytes Absolute: 0.6 10*3/uL (ref 0.1–1.0)
Monocytes Relative: 11 %
Neutro Abs: 2.5 10*3/uL (ref 1.7–7.7)
Neutrophils Relative %: 47 %
Platelets: 187 10*3/uL (ref 150–400)
RBC: 4.18 MIL/uL — ABNORMAL LOW (ref 4.22–5.81)
RDW: 13.8 % (ref 11.5–15.5)
WBC: 5.3 10*3/uL (ref 4.0–10.5)
nRBC: 0 % (ref 0.0–0.2)

## 2021-12-11 LAB — COMPREHENSIVE METABOLIC PANEL
ALT: 12 U/L (ref 0–44)
AST: 18 U/L (ref 15–41)
Albumin: 3.4 g/dL — ABNORMAL LOW (ref 3.5–5.0)
Alkaline Phosphatase: 67 U/L (ref 38–126)
Anion gap: 3 — ABNORMAL LOW (ref 5–15)
BUN: 27 mg/dL — ABNORMAL HIGH (ref 8–23)
CO2: 24 mmol/L (ref 22–32)
Calcium: 8.1 mg/dL — ABNORMAL LOW (ref 8.9–10.3)
Chloride: 110 mmol/L (ref 98–111)
Creatinine, Ser: 1.14 mg/dL (ref 0.61–1.24)
GFR, Estimated: >60 mL/min (ref >60)
Glucose, Bld: 101 mg/dL — ABNORMAL HIGH (ref 70–99)
Potassium: 3.9 mmol/L (ref 3.5–5.1)
Sodium: 137 mmol/L (ref 135–145)
Total Bilirubin: 0.5 mg/dL (ref 0.3–1.2)
Total Protein: 6.2 g/dL — ABNORMAL LOW (ref 6.5–8.1)

## 2021-12-11 LAB — LACTATE DEHYDROGENASE: LDH: 121 U/L (ref 98–192)

## 2021-12-11 MED ORDER — HEPARIN SOD (PORK) LOCK FLUSH 100 UNIT/ML IV SOLN
500.0000 [IU] | Freq: Once | INTRAVENOUS | Status: AC
  Administered 2021-12-11: 500 [IU] via INTRAVENOUS
  Filled 2021-12-11: qty 5

## 2021-12-11 MED ORDER — SODIUM CHLORIDE 0.9% FLUSH
10.0000 mL | Freq: Once | INTRAVENOUS | Status: AC
  Administered 2021-12-11: 10 mL via INTRAVENOUS
  Filled 2021-12-11: qty 10

## 2021-12-11 NOTE — Progress Notes (Signed)
Patient tolerated port flush well today, port flushes well. Blood return noted. No concerns voiced, labs drawn. Patient over to see MD. ?

## 2021-12-11 NOTE — Progress Notes (Signed)
?Hematology/Oncology Progress note ?[phone] [phone] ?  ? ? ? ?Patient Care Team: ?Olin Hauser, DO as PCP - General (Family Medicine) ?Earlie Server, MD as Consulting Physician (Hematology and Oncology) ? ?REFERRING PROVIDER: ?Nobie Putnam *  ?CHIEF COMPLAINTS/REASON FOR VISIT:  ?Follow up for HIV related high-grade B-cell lymphoma  ? ?HISTORY OF PRESENTING ILLNESS:  ? ?Justin Wallace is a  76 y.o.  male with PMH listed below was seen in consultation at the request of  Nobie Putnam *  for evaluation of symptomatic anemia ?Patient reported history of sudden onset of diffuse body aches, decreased appetite, headache low-grade fever, profound weakness/fatigue about 2 weeks ago.  Prior to the onset of symptoms, he went to a funeral as well as applicable clinic.  He reports that he has received COVID-19 vaccination previously.  He moved from [place].  Daughter is an Therapist, sports and told him to get COVID-19 PCR checked and he was tested negative. ?Patient also reports a sudden drop of weight since the onset of symptoms.  Patient has a history of celiac disease.  No colonoscopy records or pathology records in current EMR.  Diagnosis was done by [facility] gastroenterology. ? ?Patient reports that he was in his usual state of health until the onset of symptoms.  ?03/10/2020, patient was evaluated by primary care provider.  Blood work showed mild anemia with hemoglobin of 13.1, ?Iron panel showed decreased saturation of 8, ferritin 218, TIBC 291, patient was referred to hematology for evaluation of symptomatic anemia and iron deficiency. ? ?Patient reports that her body aches, fever and headache symptoms have improved.  However he continues to feel very tired and fatigued.  Appetite has decreased and weight remains low. ? ?# Physical examination showed left axillary mass ? ultrasound confirmed a left axillary large soft tissue mass up to 6.8 cm with internal vascularity. ?03/30/2020 CT  chest with contrast showed soft tissue mass/enlarged lymph node in the left axilla 6.3 cm, concerning for primary mass or metastatic lymphadenopathy.  There are additional prominent although much smaller right axillary lymph nodes, pretracheal and superior mediastinal lymph nodes.  Findings are concerning for malignancy such as lymphoma or nodal metastatic disease. ? ?# treated for Lymes disease, finished doxycycline 152m BID for 21 days course. 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 he has started treatment with Biktarvy, also started on Bactrim daily.  ?04/26/2020 PET scan showed large intensely hypermetabolic left axillary mass SUV 28.  Additional small bilateral axilla lymph node with mild metabolic activity SUV 2.  Metabolic activity through the porta hepatis region without enlarged nodes identified.  No bone lesions. ?05/02/2020 bone marrow biopsy showed hypercellular marrow for age with trilineage hematopoiesis.  Increased number of CD10 positive B cells, cannot rule out minimal or subtle involvement of marrow by the high-grade B-cell lymphoma. ?Normal cytogenetics. ?04/13/2020, left axillary lymph node biopsy showed high-grade B-cell lymphoma with Burkitt morphology.  Positive for BCL6 and MYC expression.  FISH testing showed negative for BCL-2/BCL6/MYC gene rearrangement.   ?Trisomy 3 and 134were identified and are nonspecific findings seen in clonal lymphoid neoplasm.  Final results were signed out on 05/30/2020-due to the delay of ? FISH results.  [place] [place] [facility] [place] [place] lymphoma is best classified as high-grade B-cell lymphoma not otherwise specified.  It does not meet morphological criteria for diffuse large B-cell lymphoma nor molecular criteria for Burkitt lymphoma.  11 q. alteration testing has been sent.  Results are pending. ?04/26/2020, MUGA testing showed  normal LVEF of 56% with normal LV wall motion.   ?05/13/2020, echocardiogram showed low normal end ejection fraction of 50 to  55%. ? ?#04/19/2020 Mediport placed by Dr. Lucky Cowboy ?# Chemotherapy ?# 06/06/2020 cycle 1 R-EPOCH +GCSF                    Intrathecal MTX ?# 06/27/2020 cycle 2 R- EPOCH +GCSF [+ 1 level]  Intrathecal MTX ?# 07/18/2020 cycle 3 R -EPOCH +GCSF [+ 1 level]  Intrathecal MTX ?# 08/03/2020 PET negative. Deuville 3 ?# 08/08/2020 cycle 4 R EPOCH +GCSF  [+ 2 level]  Intrathecal MTX- did not tolerate due to cytopenia and mucositis ?# 08/29/2020 cycle 5 R -EPOCH +GCSF [+ 1 level]  Intrathecal MTX ?# 09/19/2020 cycle 6 R -EPOCH +GCSF [+ 1 level]  Intrathecal MTX ? ?5/5/ 2022 Post treatment PET  ?- Reduced size and activity of the prior left axillary lymph node, currently Deauville 2 ?- Deauville 5 activity in a low-density collection adjacent to the right proximal biceps muscle ?- New Deauville 4 left inguinal lymph nodes  ? ?02/20/2021 follow up PET scan- Deauville 2 ?-Continued decrease in size and abnormal FDG activity in the index left axillary lymph node ?-near complete resolution of the abnormal FDG avidity in the left inguinal ?lymph nodes ?- Decreased hypermetabolic activity associated with the low-density fluid collection along the right proximal bicep muscle, favored to ?represent synovitis/bursitis  ? ?INTERVAL HISTORY ?Justin Wallace is a 76 y.o. male who has above history reviewed by me today presents for follow up visit for high-grade B-cell lymphoma  ?Patient is here by himself.  He reports feeling well. ?#10/03/2021, upper endoscopy showed congested and nodular mucosa in the greater curvature, antrum and prepyloric region of the stomach.  Biopsied. 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 ? ?Patient reports feeling well.  Appetite is fair.  No night sweats.   ?He has lost 7 pounds since January 2023. ?+ Right axillary  cystic mass increasing in size and tender. ?+ Scalp lesions for about 1 year. ? ? Review of Systems  ?Constitutional:  Positive for fatigue. Negative for appetite change, chills, fever and unexpected weight change.  ?HENT:   Negative for hearing loss, mouth sores and voice change.   ?Eyes:  Negative for eye problems and icterus.  ?Respiratory:  Negative for chest tightness, cough and shortness of breath.   ?Cardiovascular:  Negative for chest pain and leg swelling.  ?Gastrointestinal:  Negative for abdominal distention and abdominal pain.  ?Endocrine: Negative for hot flashes.  ?Genitourinary:  Negative for difficulty urinating, dysuria and frequency.   ?Musculoskeletal:  Negative for arthralgias.  ?Skin:  Negative for itching and rash.  ?Neurological:  Positive for numbness. Negative for light-headedness.  ?Hematological:  Negative for adenopathy. Does not bruise/bleed easily.  ?Psychiatric/Behavioral:  Negative for confusion.   ? ?MEDICAL HISTORY:  ?Past Medical History:  ?Diagnosis Date  ? Celiac disease   ? 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[place]) 05/27/2020  ? HIV (human immunodeficiency virus infection) ([place])   ? Lyme disease   ? Lymphoma ([place])   ? ? ?SURGICAL HISTORY: ?Past Surgical History:  ?Procedure Laterality Date  ? ESOPHAGOGASTRODUODENOSCOPY (EGD) WITH PROPOFOL N/A 10/03/2021  ? Procedure: ESOPHAGOGASTRODUODENOSCOPY (EGD) WITH PROPOFOL;  Surgeon: Jonathon Bellows, MD;  Location: [facility] ENDOSCOPY;  Service: Gastroenterology;  Laterality: N/A;  ? PORTA CATH INSERTION  N/A 04/25/2020  ? Procedure: PORTA CATH INSERTION;  Surgeon: Algernon Huxley, MD;  Location: [facility];  Service: Cardiovascular;  Laterality: N/A;  ? ? ?SOCIAL HISTORY: ?Social History  ? ?Socioeconomic History  ? Marital status: Married  ?  Spouse name: Opal Sidles   ? Number of children: 2  ? Years of education: Not on file  ? Highest education level: Not on file  ?Occupational History  ? Not on file  ?Tobacco Use  ? Smoking status: Never  ? Smokeless  tobacco: Never  ?Vaping Use  ? Vaping Use: Never used  ?Substance and Sexual Activity  ? Alcohol use: Yes  ?  Comment: occasional  ? Drug use: Never  ? Sexual activity: Never  ?Other Topics Concern  ? Not on file

## 2021-12-12 ENCOUNTER — Other Ambulatory Visit (HOSPITAL_COMMUNITY): Payer: Self-pay

## 2021-12-13 LAB — COMP PANEL: LEUKEMIA/LYMPHOMA

## 2021-12-19 ENCOUNTER — Ambulatory Visit
Admission: RE | Admit: 2021-12-19 | Discharge: 2021-12-19 | Disposition: A | Discharge status: Home / Self Care | Payer: Medicare HMO | Source: Ambulatory Visit | Attending: Oncology | Admitting: Oncology

## 2021-12-19 DIAGNOSIS — C8510 Unspecified B-cell lymphoma, unspecified site: Secondary | ICD-10-CM | POA: Diagnosis not present

## 2021-12-19 DIAGNOSIS — R2231 Localized swelling, mass and lump, right upper limb: Secondary | ICD-10-CM | POA: Diagnosis not present

## 2021-12-20 ENCOUNTER — Telehealth: Payer: Self-pay

## 2021-12-20 NOTE — Telephone Encounter (Signed)
Attempted to fax referral to [facility] dermatology multiple times but fax failed. Called office and verified fax and number was correct.   Called pt and advised him to call dermatology office and set up appt. He verbalized understanding. [facility] ph number provided to pt.

## 2021-12-22 ENCOUNTER — Telehealth: Payer: Self-pay

## 2021-12-22 DIAGNOSIS — C8510 Unspecified B-cell lymphoma, unspecified site: Secondary | ICD-10-CM

## 2021-12-22 DIAGNOSIS — R2231 Localized swelling, mass and lump, right upper limb: Secondary | ICD-10-CM

## 2021-12-22 NOTE — Telephone Encounter (Signed)
-----   Message from Earlie Server, MD sent at 12/22/2021  8:00 AM EDT ----- Please arrange patient to get biopsy ASAP.

## 2021-12-22 NOTE — Telephone Encounter (Signed)
IR Dr. Denna Haggard has reveiwed Justin Wallace images and recommends MRI Shoulder first. Per Dr. Tasia Catchings, ok to get MRI shoulder. Called pt to inform him of the change in plans but he did not answer. I did leave a detailed message letting him know.   Dr. Tasia Catchings, Is MRI w/wo? And so you want this STAT or routine?

## 2021-12-22 NOTE — Telephone Encounter (Addendum)
Request for biospy of Rt axillary mass has been faxed to IR scheduling. Fax confirmation received. Will contact pt with appt details once scheduled.

## 2021-12-22 NOTE — Telephone Encounter (Signed)
Unable to reach pt by phone, detailed VM left and mychart message sent.

## 2021-12-26 ENCOUNTER — Other Ambulatory Visit (HOSPITAL_COMMUNITY): Payer: Self-pay

## 2021-12-26 ENCOUNTER — Other Ambulatory Visit: Payer: Self-pay

## 2021-12-26 DIAGNOSIS — D485 Neoplasm of uncertain behavior of skin: Secondary | ICD-10-CM | POA: Diagnosis not present

## 2021-12-26 DIAGNOSIS — L570 Actinic keratosis: Secondary | ICD-10-CM | POA: Diagnosis not present

## 2021-12-26 DIAGNOSIS — C4442 Squamous cell carcinoma of skin of scalp and neck: Secondary | ICD-10-CM | POA: Diagnosis not present

## 2021-12-26 DIAGNOSIS — L578 Other skin changes due to chronic exposure to nonionizing radiation: Secondary | ICD-10-CM | POA: Diagnosis not present

## 2021-12-26 DIAGNOSIS — R2232 Localized swelling, mass and lump, left upper limb: Secondary | ICD-10-CM

## 2021-12-26 NOTE — Telephone Encounter (Signed)
Please schedule patient of MRI of right shoulder STAT. Please inform patient of appt.

## 2021-12-27 ENCOUNTER — Other Ambulatory Visit (HOSPITAL_COMMUNITY): Payer: Self-pay

## 2021-12-27 ENCOUNTER — Ambulatory Visit
Admission: RE | Admit: 2021-12-27 | Discharge: 2021-12-27 | Disposition: A | Discharge status: Home / Self Care | Payer: Medicare HMO | Source: Ambulatory Visit | Attending: Oncology | Admitting: Oncology

## 2021-12-27 DIAGNOSIS — Z8572 Personal history of non-Hodgkin lymphomas: Secondary | ICD-10-CM | POA: Diagnosis not present

## 2021-12-27 DIAGNOSIS — M19011 Primary osteoarthritis, right shoulder: Secondary | ICD-10-CM | POA: Diagnosis not present

## 2021-12-27 DIAGNOSIS — L0291 Cutaneous abscess, unspecified: Secondary | ICD-10-CM | POA: Diagnosis not present

## 2021-12-27 DIAGNOSIS — S43431A Superior glenoid labrum lesion of right shoulder, initial encounter: Secondary | ICD-10-CM | POA: Diagnosis not present

## 2021-12-27 DIAGNOSIS — R2232 Localized swelling, mass and lump, left upper limb: Secondary | ICD-10-CM | POA: Insufficient documentation

## 2021-12-27 MED ORDER — GADOBUTROL 1 MMOL/ML IV SOLN
7.5000 mL | Freq: Once | INTRAVENOUS | Status: AC | PRN
  Administered 2021-12-27: 7.5 mL via INTRAVENOUS

## 2021-12-28 ENCOUNTER — Encounter: Payer: Self-pay | Admitting: Oncology

## 2021-12-28 ENCOUNTER — Telehealth: Payer: Self-pay

## 2021-12-28 DIAGNOSIS — R2231 Localized swelling, mass and lump, right upper limb: Secondary | ICD-10-CM

## 2021-12-28 NOTE — Telephone Encounter (Signed)
Pt informed of MRI results and verbalized understanding. Referral for orthopedics placed.

## 2021-12-28 NOTE — Telephone Encounter (Signed)
-----   Message from Earlie Server, MD sent at 12/28/2021  9:48 AM EDT ----- Please let him know that MRI showed that the axillary mass could be a cyst or abscess.  Recommend orthopedic evaluation. Refer please.  I will send a message to his ID physician as well.

## 2021-12-29 ENCOUNTER — Encounter: Payer: Self-pay | Admitting: Oncology

## 2022-01-10 DIAGNOSIS — C4442 Squamous cell carcinoma of skin of scalp and neck: Secondary | ICD-10-CM | POA: Diagnosis not present

## 2022-01-13 ENCOUNTER — Other Ambulatory Visit: Payer: Self-pay | Admitting: Nurse Practitioner

## 2022-01-17 ENCOUNTER — Telehealth: Payer: Self-pay

## 2022-01-17 DIAGNOSIS — C4442 Squamous cell carcinoma of skin of scalp and neck: Secondary | ICD-10-CM | POA: Diagnosis not present

## 2022-01-17 NOTE — Telephone Encounter (Signed)
Received call from [facility] at [facility] clinic stating that MD has reviewed patient's chart and he will need to be referred to [facility] orthopedic oncology. Margreta Journey will call pt to notify him of this.   Referral faxed to [facility] orthopedic onc Ph: [phone] Fax: [phone]

## 2022-01-18 DIAGNOSIS — D225 Melanocytic nevi of trunk: Secondary | ICD-10-CM | POA: Diagnosis not present

## 2022-01-18 DIAGNOSIS — L570 Actinic keratosis: Secondary | ICD-10-CM | POA: Diagnosis not present

## 2022-01-18 DIAGNOSIS — L578 Other skin changes due to chronic exposure to nonionizing radiation: Secondary | ICD-10-CM | POA: Diagnosis not present

## 2022-01-18 DIAGNOSIS — Z872 Personal history of diseases of the skin and subcutaneous tissue: Secondary | ICD-10-CM | POA: Diagnosis not present

## 2022-01-18 DIAGNOSIS — L821 Other seborrheic keratosis: Secondary | ICD-10-CM | POA: Diagnosis not present

## 2022-01-18 DIAGNOSIS — Z859 Personal history of malignant neoplasm, unspecified: Secondary | ICD-10-CM | POA: Diagnosis not present

## 2022-01-19 ENCOUNTER — Other Ambulatory Visit (HOSPITAL_COMMUNITY): Payer: Self-pay

## 2022-01-24 NOTE — Telephone Encounter (Signed)
Spoke to referral coordinator, Herbert Spires, who states that referral has been received and she will be calling pt today to set up appt.

## 2022-01-31 ENCOUNTER — Other Ambulatory Visit (HOSPITAL_COMMUNITY): Payer: Self-pay

## 2022-02-01 ENCOUNTER — Ambulatory Visit: Payer: Medicare HMO | Admitting: Gastroenterology

## 2022-02-02 ENCOUNTER — Other Ambulatory Visit: Payer: Self-pay

## 2022-02-02 DIAGNOSIS — Z113 Encounter for screening for infections with a predominantly sexual mode of transmission: Secondary | ICD-10-CM

## 2022-02-02 DIAGNOSIS — B20 Human immunodeficiency virus [HIV] disease: Secondary | ICD-10-CM

## 2022-02-02 DIAGNOSIS — Z79899 Other long term (current) drug therapy: Secondary | ICD-10-CM

## 2022-02-05 ENCOUNTER — Inpatient Hospital Stay: Payer: Medicare HMO | Attending: Oncology

## 2022-02-05 DIAGNOSIS — C8510 Unspecified B-cell lymphoma, unspecified site: Secondary | ICD-10-CM | POA: Insufficient documentation

## 2022-02-05 DIAGNOSIS — Z95828 Presence of other vascular implants and grafts: Secondary | ICD-10-CM

## 2022-02-05 DIAGNOSIS — K900 Celiac disease: Secondary | ICD-10-CM | POA: Diagnosis not present

## 2022-02-05 DIAGNOSIS — R222 Localized swelling, mass and lump, trunk: Secondary | ICD-10-CM | POA: Diagnosis not present

## 2022-02-05 DIAGNOSIS — Z452 Encounter for adjustment and management of vascular access device: Secondary | ICD-10-CM | POA: Insufficient documentation

## 2022-02-05 DIAGNOSIS — B20 Human immunodeficiency virus [HIV] disease: Secondary | ICD-10-CM | POA: Insufficient documentation

## 2022-02-05 DIAGNOSIS — G620 Drug-induced polyneuropathy: Secondary | ICD-10-CM | POA: Diagnosis not present

## 2022-02-05 MED ORDER — SODIUM CHLORIDE 0.9% FLUSH
10.0000 mL | Freq: Once | INTRAVENOUS | Status: AC
  Administered 2022-02-05: 10 mL via INTRAVENOUS
  Filled 2022-02-05: qty 10

## 2022-02-05 MED ORDER — HEPARIN SOD (PORK) LOCK FLUSH 100 UNIT/ML IV SOLN
500.0000 [IU] | Freq: Once | INTRAVENOUS | Status: AC
  Administered 2022-02-05: 500 [IU] via INTRAVENOUS
  Filled 2022-02-05: qty 5

## 2022-02-06 DIAGNOSIS — D481 Neoplasm of uncertain behavior of connective and other soft tissue: Secondary | ICD-10-CM | POA: Diagnosis not present

## 2022-02-08 ENCOUNTER — Other Ambulatory Visit (HOSPITAL_COMMUNITY): Payer: Self-pay

## 2022-02-08 ENCOUNTER — Other Ambulatory Visit: Payer: Self-pay

## 2022-02-08 ENCOUNTER — Ambulatory Visit: Payer: Medicare HMO

## 2022-02-08 ENCOUNTER — Encounter: Payer: Self-pay | Admitting: Pharmacist

## 2022-02-08 ENCOUNTER — Telehealth: Payer: Self-pay

## 2022-02-08 ENCOUNTER — Other Ambulatory Visit (HOSPITAL_COMMUNITY)
Admission: RE | Admit: 2022-02-08 | Discharge: 2022-02-08 | Disposition: A | Discharge status: Home / Self Care | Payer: Medicare HMO | Source: Ambulatory Visit | Attending: Internal Medicine | Admitting: Internal Medicine

## 2022-02-08 ENCOUNTER — Other Ambulatory Visit: Payer: Medicare HMO

## 2022-02-08 DIAGNOSIS — Z113 Encounter for screening for infections with a predominantly sexual mode of transmission: Secondary | ICD-10-CM | POA: Diagnosis not present

## 2022-02-08 DIAGNOSIS — B20 Human immunodeficiency virus [HIV] disease: Secondary | ICD-10-CM

## 2022-02-08 DIAGNOSIS — Z79899 Other long term (current) drug therapy: Secondary | ICD-10-CM | POA: Diagnosis not present

## 2022-02-08 NOTE — Telephone Encounter (Signed)
[facility] Patient Advocate Encounter   I was successful in securing patient a $ 3774.14 grant from [employer] to provide copayment coverage for Biktarvy.  The patient's out of pocket cost will be $5.00 monthly.     I have spoken with the patient.    The billing information is as follows and has been shared with [facility].       Patient knows to call the office with questions or concerns.  Ileene Patrick, [facility] Patient [facility] for Infectious Disease Phone: [phone] Fax:  [phone]

## 2022-02-09 LAB — URINE CYTOLOGY ANCILLARY ONLY
Chlamydia: NEGATIVE
Comment: NEGATIVE
Comment: NORMAL
Neisseria Gonorrhea: NEGATIVE

## 2022-02-09 LAB — T-HELPER CELL (CD4) - (RCID CLINIC ONLY)
CD4 % Helper T Cell: 17 % — ABNORMAL LOW (ref 33–65)
CD4 T Cell Abs: 382 /uL — ABNORMAL LOW (ref 400–1790)

## 2022-02-12 ENCOUNTER — Encounter: Payer: Self-pay | Admitting: Internal Medicine

## 2022-02-12 LAB — CBC WITH DIFFERENTIAL/PLATELET
Absolute Monocytes: 499 cells/uL (ref 200–950)
Basophils Absolute: 38 cells/uL (ref 0–200)
Basophils Relative: 0.8 %
Eosinophils Absolute: 67 cells/uL (ref 15–500)
Eosinophils Relative: 1.4 %
HCT: 40.6 % (ref 38.5–50.0)
Hemoglobin: 13.6 g/dL (ref 13.2–17.1)
Lymphs Abs: 2366 cells/uL (ref 850–3900)
MCH: 31.9 pg (ref 27.0–33.0)
MCHC: 33.5 g/dL (ref 32.0–36.0)
MCV: 95.1 fL (ref 80.0–100.0)
MPV: 9.4 fL (ref 7.5–12.5)
Monocytes Relative: 10.4 %
Neutro Abs: 1829 cells/uL (ref 1500–7800)
Neutrophils Relative %: 38.1 %
Platelets: 192 10*3/uL (ref 140–400)
RBC: 4.27 10*6/uL (ref 4.20–5.80)
RDW: 13.3 % (ref 11.0–15.0)
Total Lymphocyte: 49.3 %
WBC: 4.8 10*3/uL (ref 3.8–10.8)

## 2022-02-12 LAB — LIPID PANEL
Cholesterol: 151 mg/dL (ref <200)
HDL: 35 mg/dL — ABNORMAL LOW (ref >=40)
LDL Cholesterol (Calc): 99 mg/dL (calc)
Non-HDL Cholesterol (Calc): 116 mg/dL (calc) (ref <130)
Total CHOL/HDL Ratio: 4.3 (calc) (ref <5.0)
Triglycerides: 78 mg/dL (ref <150)

## 2022-02-12 LAB — COMPLETE METABOLIC PANEL WITH GFR
AG Ratio: 1.8 (calc) (ref 1.0–2.5)
ALT: 11 U/L (ref 9–46)
AST: 14 U/L (ref 10–35)
Albumin: 3.7 g/dL (ref 3.6–5.1)
Alkaline phosphatase (APISO): 60 U/L (ref 35–144)
BUN: 22 mg/dL (ref 7–25)
CO2: 26 mmol/L (ref 20–32)
Calcium: 8.6 mg/dL (ref 8.6–10.3)
Chloride: 107 mmol/L (ref 98–110)
Creat: 1.28 mg/dL (ref 0.70–1.28)
Globulin: 2.1 g/dL (calc) (ref 1.9–3.7)
Glucose, Bld: 91 mg/dL (ref 65–99)
Potassium: 4.2 mmol/L (ref 3.5–5.3)
Sodium: 141 mmol/L (ref 135–146)
Total Bilirubin: 0.5 mg/dL (ref 0.2–1.2)
Total Protein: 5.8 g/dL — ABNORMAL LOW (ref 6.1–8.1)
eGFR: 59 mL/min/{1.73_m2} — ABNORMAL LOW (ref >=60)

## 2022-02-12 LAB — RPR: RPR Ser Ql: NONREACTIVE

## 2022-02-12 LAB — HIV-1 RNA QUANT-NO REFLEX-BLD
HIV 1 RNA Quant: NOT DETECTED Copies/mL
HIV-1 RNA Quant, Log: NOT DETECTED Log cps/mL

## 2022-02-16 ENCOUNTER — Other Ambulatory Visit (HOSPITAL_COMMUNITY): Payer: Self-pay

## 2022-02-19 ENCOUNTER — Other Ambulatory Visit: Payer: Medicare HMO

## 2022-02-19 ENCOUNTER — Encounter
Admission: RE | Admit: 2022-02-19 | Discharge: 2022-02-19 | Disposition: A | Discharge status: Home / Self Care | Payer: Medicare HMO | Source: Ambulatory Visit | Attending: Oncology | Admitting: Oncology

## 2022-02-19 DIAGNOSIS — C8511 Unspecified B-cell lymphoma, lymph nodes of head, face, and neck: Secondary | ICD-10-CM | POA: Diagnosis not present

## 2022-02-19 DIAGNOSIS — C8510 Unspecified B-cell lymphoma, unspecified site: Secondary | ICD-10-CM | POA: Diagnosis not present

## 2022-02-19 LAB — GLUCOSE, CAPILLARY: Glucose-Capillary: 92 mg/dL (ref 70–99)

## 2022-02-19 MED ORDER — FLUDEOXYGLUCOSE F - 18 (FDG) INJECTION
8.8000 | Freq: Once | INTRAVENOUS | Status: AC | PRN
  Administered 2022-02-19: 8.68 via INTRAVENOUS

## 2022-02-22 ENCOUNTER — Encounter: Payer: Self-pay | Admitting: Oncology

## 2022-02-22 ENCOUNTER — Inpatient Hospital Stay (HOSPITAL_BASED_OUTPATIENT_CLINIC_OR_DEPARTMENT_OTHER): Payer: Medicare HMO | Admitting: Oncology

## 2022-02-22 ENCOUNTER — Ambulatory Visit: Payer: Medicare HMO | Admitting: Internal Medicine

## 2022-02-22 ENCOUNTER — Other Ambulatory Visit: Payer: Self-pay

## 2022-02-22 ENCOUNTER — Inpatient Hospital Stay: Payer: Medicare HMO

## 2022-02-22 ENCOUNTER — Other Ambulatory Visit (HOSPITAL_COMMUNITY): Payer: Self-pay

## 2022-02-22 ENCOUNTER — Encounter: Payer: Self-pay | Admitting: Internal Medicine

## 2022-02-22 VITALS — BP 92/60 | HR 60 | Temp 98.0°F | Resp 18 | Ht 69.0 in | Wt 162.5 lb

## 2022-02-22 DIAGNOSIS — C8510 Unspecified B-cell lymphoma, unspecified site: Secondary | ICD-10-CM | POA: Diagnosis not present

## 2022-02-22 DIAGNOSIS — R222 Localized swelling, mass and lump, trunk: Secondary | ICD-10-CM | POA: Diagnosis not present

## 2022-02-22 DIAGNOSIS — G620 Drug-induced polyneuropathy: Secondary | ICD-10-CM

## 2022-02-22 DIAGNOSIS — R2231 Localized swelling, mass and lump, right upper limb: Secondary | ICD-10-CM | POA: Diagnosis not present

## 2022-02-22 DIAGNOSIS — T451X5A Adverse effect of antineoplastic and immunosuppressive drugs, initial encounter: Secondary | ICD-10-CM | POA: Diagnosis not present

## 2022-02-22 DIAGNOSIS — K900 Celiac disease: Secondary | ICD-10-CM | POA: Diagnosis not present

## 2022-02-22 DIAGNOSIS — B20 Human immunodeficiency virus [HIV] disease: Secondary | ICD-10-CM

## 2022-02-22 DIAGNOSIS — Z95828 Presence of other vascular implants and grafts: Secondary | ICD-10-CM

## 2022-02-22 DIAGNOSIS — R2232 Localized swelling, mass and lump, left upper limb: Secondary | ICD-10-CM

## 2022-02-22 DIAGNOSIS — Z452 Encounter for adjustment and management of vascular access device: Secondary | ICD-10-CM | POA: Diagnosis not present

## 2022-02-22 LAB — COMPREHENSIVE METABOLIC PANEL
ALT: 15 U/L (ref 0–44)
AST: 22 U/L (ref 15–41)
Albumin: 3.6 g/dL (ref 3.5–5.0)
Alkaline Phosphatase: 62 U/L (ref 38–126)
Anion gap: 5 (ref 5–15)
BUN: 25 mg/dL — ABNORMAL HIGH (ref 8–23)
CO2: 26 mmol/L (ref 22–32)
Calcium: 8.8 mg/dL — ABNORMAL LOW (ref 8.9–10.3)
Chloride: 108 mmol/L (ref 98–111)
Creatinine, Ser: 1.15 mg/dL (ref 0.61–1.24)
GFR, Estimated: >60 mL/min (ref >60)
Glucose, Bld: 125 mg/dL — ABNORMAL HIGH (ref 70–99)
Potassium: 4.4 mmol/L (ref 3.5–5.1)
Sodium: 139 mmol/L (ref 135–145)
Total Bilirubin: 0.9 mg/dL (ref 0.3–1.2)
Total Protein: 6.2 g/dL — ABNORMAL LOW (ref 6.5–8.1)

## 2022-02-22 LAB — CBC WITH DIFFERENTIAL/PLATELET
Abs Immature Granulocytes: 0.01 10*3/uL (ref 0.00–0.07)
Basophils Absolute: 0.1 10*3/uL (ref 0.0–0.1)
Basophils Relative: 1 %
Eosinophils Absolute: 0.1 10*3/uL (ref 0.0–0.5)
Eosinophils Relative: 2 %
HCT: 39 % (ref 39.0–52.0)
Hemoglobin: 13.5 g/dL (ref 13.0–17.0)
Immature Granulocytes: 0 %
Lymphocytes Relative: 39 %
Lymphs Abs: 2.1 10*3/uL (ref 0.7–4.0)
MCH: 32.3 pg (ref 26.0–34.0)
MCHC: 34.6 g/dL (ref 30.0–36.0)
MCV: 93.3 fL (ref 80.0–100.0)
Monocytes Absolute: 0.6 10*3/uL (ref 0.1–1.0)
Monocytes Relative: 11 %
Neutro Abs: 2.5 10*3/uL (ref 1.7–7.7)
Neutrophils Relative %: 47 %
Platelets: 178 10*3/uL (ref 150–400)
RBC: 4.18 MIL/uL — ABNORMAL LOW (ref 4.22–5.81)
RDW: 13.4 % (ref 11.5–15.5)
WBC: 5.4 10*3/uL (ref 4.0–10.5)
nRBC: 0 % (ref 0.0–0.2)

## 2022-02-22 LAB — LACTATE DEHYDROGENASE: LDH: 127 U/L (ref 98–192)

## 2022-02-22 NOTE — Assessment & Plan Note (Addendum)
Patient developed a axillary mass since prior follow up with imaging consistent with abscess vs cyst vs soft tissue sarcoma.  He has no symptoms really associated with this.  Seen by orthopedic oncology at [facility] on 02/06/22 and recommended biopsy which will be done 03/09/22.  Will continue to follow up with [facility] for now pending biopsy results.

## 2022-02-22 NOTE — Assessment & Plan Note (Signed)
Patient follows with oncology Dr Tasia Catchings.  Recent PET scan done this week and follow up with Dr Tasia Catchings later today.

## 2022-02-22 NOTE — Assessment & Plan Note (Signed)
Currently on Biktarvy and taking consistently without adherence, tolerance, or access issues.  He had labs drawn 02/08/22 and his VL was undetectable, CD 4 count 382 (17%).  He has shown evidence of immune reconstitution since starting ART and his HIV is now under good control.  Will continue daily Biktarvy as ordered.  Follow up again in 6 months.

## 2022-02-22 NOTE — Progress Notes (Signed)
Hematology/Oncology Progress note [phone] [phone]      Patient Care Team: Justin Hauser, DO as PCP - General (Family Medicine) Justin Server, MD as Consulting Physician (Hematology and Oncology)  REFERRING PROVIDER: Nobie Wallace *  CHIEF COMPLAINTS/REASON FOR VISIT:  Follow up for HIV related high-grade B-cell lymphoma   HISTORY OF PRESENTING ILLNESS:   Justin Wallace is a  76 y.o.  male with PMH listed below was seen in consultation at the request of  Justin Wallace *  for evaluation of symptomatic anemia Patient reported history of sudden onset of diffuse body aches, decreased appetite, headache low-grade fever, profound weakness/fatigue about 2 weeks ago.  Prior to the onset of symptoms, he went to a funeral as well as applicable clinic.  He reports that he has received COVID-19 vaccination previously.  He moved from [place].  Daughter is an Therapist, sports and told him to get COVID-19 PCR checked and he was tested negative. Patient also reports a sudden drop of weight since the onset of symptoms.  Patient has a history of celiac disease.  No colonoscopy records or pathology records in current EMR.  Diagnosis was done by [facility] gastroenterology.  Patient reports that he was in his usual state of health until the onset of symptoms.  03/10/2020, patient was evaluated by primary care provider.  Blood work showed mild anemia with hemoglobin of 13.1, Iron panel showed decreased saturation of 8, ferritin 218, TIBC 291, patient was referred to hematology for evaluation of symptomatic anemia and iron deficiency.  Patient reports that her body aches, fever and headache symptoms have improved.  However he continues to feel very tired and fatigued.  Appetite has decreased and weight remains low.  # Physical examination showed left axillary mass  ultrasound confirmed a left axillary large soft tissue mass up to 6.8 cm with internal vascularity. 03/30/2020 CT  chest with contrast showed soft tissue mass/enlarged lymph node in the left axilla 6.3 cm, concerning for primary mass or metastatic lymphadenopathy.  There are additional prominent although much smaller right axillary lymph nodes, pretracheal and superior mediastinal lymph nodes.  Findings are concerning for malignancy such as lymphoma or nodal metastatic disease.  # treated for Lymes disease, finished doxycycline 160m BID for 21 days course. 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 he has started treatment with Biktarvy, also started on Bactrim daily.  04/26/2020 PET scan showed large intensely hypermetabolic left axillary mass SUV 28.  Additional small bilateral axilla lymph node with mild metabolic activity SUV 2.  Metabolic activity through the porta hepatis region without enlarged nodes identified.  No bone lesions. 05/02/2020 bone marrow biopsy showed hypercellular marrow for age with trilineage hematopoiesis.  Increased number of CD10 positive B cells, cannot rule out minimal or subtle involvement of marrow by the high-grade B-cell lymphoma. Normal cytogenetics. 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  Positive for BCL6 and MYC expression.  FISH testing showed negative for BCL-2/BCL6/MYC gene rearrangement.   Trisomy 3 and 18 were identified and are nonspecific findings seen in clonal lymphoid neoplasm.  Final results were signed out on 05/30/2020-due to the delay of  FISH results.  [place], lymphoma is best classified as high-grade B-cell lymphoma not otherwise specified.  It does not meet morphological criteria for diffuse large B-cell lymphoma nor molecular criteria for Burkitt lymphoma.  11 q. alteration testing has been sent.  Results are pending. 04/26/2020, MUGA testing showed  normal LVEF of 56% with normal LV wall motion.   05/13/2020, echocardiogram showed low normal end ejection fraction of 50 to  55%.  #04/19/2020 Mediport placed by Dr. Lucky Cowboy # Chemotherapy # 06/06/2020 cycle 1 R-EPOCH +GCSF                    Intrathecal MTX # 06/27/2020 cycle 2 R- EPOCH +GCSF [+ 1 level]  Intrathecal MTX # 07/18/2020 cycle 3 R -EPOCH +GCSF [+ 1 level]  Intrathecal MTX # 08/03/2020 PET negative. Deuville 3 # 08/08/2020 cycle 4 R EPOCH +GCSF  [+ 2 level]  Intrathecal MTX- did not tolerate due to cytopenia and mucositis # 08/29/2020 cycle 5 R -EPOCH +GCSF [+ 1 level]  Intrathecal MTX # 09/19/2020 cycle 6 R -EPOCH +GCSF [+ 1 level]  Intrathecal MTX  5/5/ 2022 Post treatment PET  - Reduced size and activity of the prior left axillary lymph node, currently Deauville 2 - Deauville 5 activity in a low-density collection adjacent to the right proximal biceps muscle - [place] Deauville 4 left inguinal lymph nodes   02/20/2021 follow up PET scan- Deauville 2 -Continued decrease in size and abnormal FDG activity in the index left axillary lymph node -near complete resolution of the abnormal FDG avidity in the left inguinal lymph nodes - Decreased hypermetabolic activity associated with the low-density fluid collection along the right proximal bicep muscle, favored to represent synovitis/bursitis   #10/03/2021, upper endoscopy showed congested and nodular mucosa in the greater curvature, antrum and prepyloric region of the stomach.  Biopsied. 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  09/05/2021, PET showed diffuse hypermetabolic activity throughout the stomach.-Endoscopy biopsy showed gastritis. Low-density peripheral hypermetabolic lesion in the right shoulder anterior to the biceps muscle-this was previously thought to be bursitis/synovitis.-I will obtain right axillary ultrasound and likely needle biopsy. New  segmental nodular airspace in the right upper lobe-likely secondary to infection, patient was treated with a course of antibiotics and symptom resolved.  INTERVAL HISTORY Emett Stapel is a 76 y.o. male who has above history reviewed by me today presents for follow up visit for high-grade B-cell lymphoma  Patient is here by himself.  He reports feeling well. + Right axillary cystic mass, was seen by [facility] orthopedic surgeon and scheduled for biopsy in August 2023. Denies constitutional symptoms.   Review of Systems  Constitutional:  Positive for fatigue. Negative for appetite change, chills, fever and unexpected weight change.  HENT:   Negative for hearing loss, mouth sores and voice change.   Eyes:  Negative for eye problems and icterus.  Respiratory:  Negative for chest tightness, cough and shortness of breath.   Cardiovascular:  Negative for chest pain and leg swelling.  Gastrointestinal:  Negative for abdominal distention and abdominal pain.  Endocrine: Negative for hot flashes.  Genitourinary:  Negative for difficulty urinating, dysuria and frequency.   Musculoskeletal:  Negative for arthralgias.  Skin:  Negative for itching and rash.  Neurological:  Positive for numbness. Negative for light-headedness.  Hematological:  Negative for adenopathy. Does not bruise/bleed easily.  Psychiatric/Behavioral:  Negative for confusion.     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[place]) 05/27/2020   HIV (human immunodeficiency virus infection) ([place])    Lyme disease    Lymphoma ([place])  SURGICAL HISTORY: Past Surgical History:  Procedure Laterality Date   ESOPHAGOGASTRODUODENOSCOPY (EGD) WITH PROPOFOL N/A 10/03/2021   Procedure: ESOPHAGOGASTRODUODENOSCOPY (EGD) WITH PROPOFOL;  Surgeon: Jonathon Bellows, MD;  Location: [facility] ENDOSCOPY;  Service: Gastroenterology;  Laterality: N/A;   PORTA CATH INSERTION N/A 04/25/2020   Procedure: PORTA  CATH INSERTION;  Surgeon: Algernon Huxley, MD;  Location: [place] [facility] LAB;  Service: Cardiovascular;  Laterality: N/A;    SOCIAL HISTORY: Social History   Socioeconomic History   Marital status: Married    Spouse name: Opal Sidles    Number of children: 2   Years of education: Not on file   Highest education level: Not on file  Occupational History   Not on file  Tobacco Use   Smoking status: Never   Smokeless tobacco: Never  Vaping Use   Vaping Use: Never used  Substance and Sexual Activity   Alcohol use: Yes    Comment: occasional   Drug use: Never   Sexual activity: Never  Other Topics Concern   Not on file  Social History Narrative   Not on file   Social Determinants of Health   Financial Resource Strain: Not on file  Food Insecurity: Not on file  Transportation Needs: Not on file  Physical Activity: Not on file  Stress: Not on file  Social Connections: Not on file  Intimate Partner Violence: Not on file    FAMILY HISTORY: Family History  Problem Relation Age of Onset   Hyperlipidemia Mother    Brain cancer Father     ALLERGIES:  has No Known Allergies.  MEDICATIONS:  Current Outpatient Medications  Medication Sig Dispense Refill   bictegravir-emtricitabine-tenofovir AF (BIKTARVY) 50-200-25 MG TABS tablet TAKE 1 TABLET BY MOUTH DAILY. 30 tablet 5   Multiple Vitamin (MULTIVITAMIN ADULT PO) Take 1 tablet by mouth daily.     No current facility-administered medications for this visit.     PHYSICAL EXAMINATION: ECOG PERFORMANCE STATUS: 1 - Symptomatic but completely ambulatory Vitals:   02/22/22 1049  BP: 92/60  Pulse: 60  Resp: 18  Temp: 98 F (36.7 C)   Filed Weights   02/22/22 1049  Weight: 162 lb 8 oz (73.7 kg)    Physical Exam Constitutional:      General: He is not in acute distress. HENT:     Head: Normocephalic and atraumatic.  Eyes:     General: No scleral icterus. Cardiovascular:     Rate and Rhythm: Normal rate and regular  rhythm.     Heart sounds: Normal heart sounds.  Pulmonary:     Effort: Pulmonary effort is normal. No respiratory distress.     Breath sounds: No wheezing.  Abdominal:     General: Bowel sounds are normal. There is no distension.     Palpations: Abdomen is soft.  Musculoskeletal:        General: No deformity. Normal range of motion.     Cervical back: Normal range of motion and neck supple.     Comments: Right axillary mass.  Skin:    General: Skin is warm and dry.     Findings: No erythema or rash.  Neurological:     Mental Status: He is alert and oriented to person, place, and time. Mental status is at baseline.     Cranial Nerves: No cranial nerve deficit.     Coordination: Coordination normal.  Psychiatric:        Mood and Affect: Mood normal.       LABORATORY DATA:  I have reviewed the data as listed Lab Results  Component Value Date   WBC 5.4 02/22/2022   HGB 13.5 02/22/2022   HCT 39.0 02/22/2022   MCV 93.3 02/22/2022   PLT 178 02/22/2022   Recent Labs    08/28/21 1255 12/11/21 1429 02/08/22 0855 02/22/22 1041  NA 137 137 141 139  K 4.2 3.9 4.2 4.4  CL 106 110 107 108  CO2 25 24 26 26   GLUCOSE 117* 101* 91 125*  BUN 25* 27* 22 25*  CREATININE 0.97 1.14 1.28 1.15  CALCIUM 8.4* 8.1* 8.6 8.8*  GFRNONAA >60 >60  --  >60  PROT 6.1* 6.2* 5.8* 6.2*  ALBUMIN 3.2* 3.4*  --  3.6  AST 18 18 14 22   ALT 12 12 11 15   ALKPHOS 77 67  --  62  BILITOT 0.3 0.5 0.5 0.9    Iron/TIBC/Ferritin/ %Sat    Component Value Date/Time   IRON 22 (L) 03/17/2020 1029   IRON 17 02/17/2018 0000   TIBC 286 03/17/2020 1029   TIBC 425 02/17/2018 0000   FERRITIN 284 03/17/2020 1029   IRONPCTSAT 8 (L) 03/17/2020 1029   IRONPCTSAT 8 (L) 03/10/2020 1113      RADIOGRAPHIC STUDIES: I have personally reviewed the radiological images as listed and agreed with the findings in the report. NM PET Image Restag (PS) Skull Base To Thigh  Result Date: 02/21/2022 CLINICAL DATA:   Subsequent treatment strategy for high-grade B-cell lymphoma. EXAM: NUCLEAR MEDICINE PET SKULL BASE TO THIGH TECHNIQUE: 8.7 mCi F-18 FDG was injected intravenously. Full-ring PET imaging was performed from the skull base to thigh after the radiotracer. CT data was obtained and used for attenuation correction and anatomic localization. Fasting blood glucose: 92 mg/dl COMPARISON:  Multiple exams, including 09/05/2021 FINDINGS: Mediastinal blood pool activity: SUV max 1.7 Liver activity: SUV max 2.3 NECK: Right level IIa lymph node on image 37 series 2 measures 0.6 cm in short axis and has a maximum SUV of 4.9, Deauville 4. This previously measured 0.7 cm in short axis with maximum SUV of 4.6. Two small left level IIa lymph nodes on image 38 of series 2 are present, the larger 0.6 cm in short axis, maximum SUV 3.0 (Deauville 4). These previously had a long-axis dimension of 0.7 cm with maximum SUV of 6.9. Incidental CT findings: none CHEST: The previous hypermetabolic airspace opacity in the right upper lobe has resolved, and probably represented pneumonia. Fluid density lesion anterior to the right upper latissimus dorsi and abutting the neurovascular structures the axilla, 3.3 by 2.0 cm on image 70 of series 2, mildly accentuated metabolic activity along its margins with maximum SUV 2.9 (Deauville 4). This was previously of similar size and had a maximum SUV of 5.5. Incidental CT findings: Atherosclerotic calcification of the thoracic aorta and left anterior descending coronary artery. Right Port-A-Cath tip: Right atrium. ABDOMEN/PELVIS: Diffuse gastric activity without definite CT correlate, probably physiologic, maximum SUV currently 5.0 and previously about 9.5 lymphomatous infiltration of the stomach is a differential diagnostic consideration Incidental CT findings: Photopenic left renal cysts. Atherosclerosis is present, including aortoiliac atherosclerotic disease. Prominent stool throughout the colon favors  constipation. SKELETON: No significant abnormal hypermetabolic activity in this region. Incidental CT findings: none IMPRESSION: 1. Small Deauville 4 lymph nodes in the neck, a right level IIa lymph node has slightly increased activity compared to prior exam although the small left level IIa lymph nodes are substantially reduced in activity compared to prior. 2. Previous hypermetabolic airspace  opacity in the right upper lobe has resolved. 3. Fluid density lesion anterior to the right upper low to Smith's dorsi along the axilla is stable in size, and reduced in activity compared to the prior exam. This could be a ganglion cyst with marginal inflammation, a cystic lymph node is difficult to exclude. 4. Diffuse gastric activity is noted but substantially improved from prior. This is most likely physiologic and less likely to be due to lymphomatous infiltration of the stomach. 5. Other imaging findings of potential clinical significance: Left renal cysts. Aortic Atherosclerosis (ICD10-I70.0). Prominent stool throughout the colon favors constipation. Coronary atherosclerosis. Electronically Signed   By: Van Clines M.D.   On: 02/21/2022 10:28    NM PET Image Restag (PS) Skull Base To Thigh  Result Date: 02/21/2022 CLINICAL DATA:  Subsequent treatment strategy for high-grade B-cell lymphoma. EXAM: NUCLEAR MEDICINE PET SKULL BASE TO THIGH TECHNIQUE: 8.7 mCi F-18 FDG was injected intravenously. Full-ring PET imaging was performed from the skull base to thigh after the radiotracer. CT data was obtained and used for attenuation correction and anatomic localization. Fasting blood glucose: 92 mg/dl COMPARISON:  Multiple exams, including 09/05/2021 FINDINGS: Mediastinal blood pool activity: SUV max 1.7 Liver activity: SUV max 2.3 NECK: Right level IIa lymph node on image 37 series 2 measures 0.6 cm in short axis and has a maximum SUV of 4.9, Deauville 4. This previously measured 0.7 cm in short axis with maximum SUV  of 4.6. Two small left level IIa lymph nodes on image 38 of series 2 are present, the larger 0.6 cm in short axis, maximum SUV 3.0 (Deauville 4). These previously had a long-axis dimension of 0.7 cm with maximum SUV of 6.9. Incidental CT findings: none CHEST: The previous hypermetabolic airspace opacity in the right upper lobe has resolved, and probably represented pneumonia. Fluid density lesion anterior to the right upper latissimus dorsi and abutting the neurovascular structures the axilla, 3.3 by 2.0 cm on image 70 of series 2, mildly accentuated metabolic activity along its margins with maximum SUV 2.9 (Deauville 4). This was previously of similar size and had a maximum SUV of 5.5. Incidental CT findings: Atherosclerotic calcification of the thoracic aorta and left anterior descending coronary artery. Right Port-A-Cath tip: Right atrium. ABDOMEN/PELVIS: Diffuse gastric activity without definite CT correlate, probably physiologic, maximum SUV currently 5.0 and previously about 9.5 lymphomatous infiltration of the stomach is a differential diagnostic consideration Incidental CT findings: Photopenic left renal cysts. Atherosclerosis is present, including aortoiliac atherosclerotic disease. Prominent stool throughout the colon favors constipation. SKELETON: No significant abnormal hypermetabolic activity in this region. Incidental CT findings: none IMPRESSION: 1. Small Deauville 4 lymph nodes in the neck, a right level IIa lymph node has slightly increased activity compared to prior exam although the small left level IIa lymph nodes are substantially reduced in activity compared to prior. 2. Previous hypermetabolic airspace opacity in the right upper lobe has resolved. 3. Fluid density lesion anterior to the right upper low to Smith's dorsi along the axilla is stable in size, and reduced in activity compared to the prior exam. This could be a ganglion cyst with marginal inflammation, a cystic lymph node is  difficult to exclude. 4. Diffuse gastric activity is noted but substantially improved from prior. This is most likely physiologic and less likely to be due to lymphomatous infiltration of the stomach. 5. Other imaging findings of potential clinical significance: Left renal cysts. Aortic Atherosclerosis (ICD10-I70.0). Prominent stool throughout the colon favors constipation. Coronary atherosclerosis. Electronically Signed  By: Van Clines M.D.   On: 02/21/2022 10:28   MR SHOULDER RIGHT W WO CONTRAST  Result Date: 12/27/2021 CLINICAL DATA:  Evaluate right axillary mass. History of B-cell lymphoma. EXAM: MRI OF THE RIGHT SHOULDER WITHOUT AND WITH CONTRAST TECHNIQUE: Multiplanar, multisequence MR imaging of the right shoulder was performed before and after the administration of intravenous contrast. CONTRAST:  7.62m GADAVIST GADOBUTROL 1 MMOL/ML IV SOLN COMPARISON:  Prior PET CTs and recent ultrasound 12/19/2021 FINDINGS: Rotator cuff:  Intact.  Mild tendinopathy. Muscles: The shoulder musculature is unremarkable. No muscle mass, myositis or muscle tear. Biceps long head:  Intact Acromioclavicular Joint: Mild degenerative changes. Type 2 acromion. No lateral downsloping or subacromial spurring. Glenohumeral Joint: The articular cartilage is intact. No joint effusion. Labrum: Superior labral tear and associated paralabral cyst. The anterior and posterior labrum are intact. Bones:  No acute bony findings.  No bone lesions. Other: The right axillary lesion has a teardrop shape and measures a maximum 5.5 x 2.4 x 2.0 cm. It has intermediate to slightly increased T1 signal intensity and high T2 signal intensity. After contrast enhancement there is moderately thick rim like enhancement. No internal enhancement is demonstrated. Findings could suggest a synovial cyst with internal proteinaceous debris, a largely liquified hematoma or in the right clinical situation an abscess. This has been present since the PET-CT  of 12/01/2020 and showed mild rim like hypermetabolism. IMPRESSION: 1. 5.5 x 2.4 x 2.0 cm right axillary lesion. MR findings could suggest a synovial cyst with internal proteinaceous debris, a largely liquified hematoma or in the right clinical situation an chronic walled-off abscess. This has been present since the PET-CT of 12/01/2020 and showed mild rim like hypermetabolism. 2. Superior labral tear and associated paralabral cyst. 3. Intact rotator cuff tendons. 4. No bone lesions. Electronically Signed   By: PMarijo SanesM.D.   On: 12/27/2021 12:55   [place] UPPER EXTREM LTD SOFT TISSUE NON VASCULAR  Result Date: 12/20/2021 CLINICAL DATA:  Cystic mass in the right axilla. History of B-cell lymphoma. EXAM: ULTRASOUND RIGHT UPPER EXTREMITY LIMITED TECHNIQUE: Ultrasound examination of the upper extremity soft tissues was performed in the area of clinical concern. COMPARISON:  PET CT scan 09/05/2021. FINDINGS: Scanning was directed to the region of concern as indicated by the patient. A mass measuring 3.5 by 2.4 by 3.0 cm is identified. The lesion is solid. No flow within the lesion is seen on Doppler imaging. Although its exact location is difficult to determine it appears to correlate with the lesion seen on the prior PET CT scan. IMPRESSION: Lesion in the region of concern appears to be solid on ultrasound and cannot be characterized. Biopsy is recommended further evaluation. Electronically Signed   By: TInge RiseM.D.   On: 12/20/2021 08:05       ASSESSMENT & PLAN:  1. 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[place]   2. Axillary mass, right   3. 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 IHC MYC+, BCL 6+, negative translocation. Status post 6 cycles of dose adjusted EPOCH with rituximab.  Patient had +1 dose level increase for cycle  2/cycle 3, +2 dose level increase for cycle 4, +1 dose level for cycle 5 and 6. Intrathecal MTX Patient is doing well.  No constitutional symptoms 02/19/2022, PET scan showed small dural 4 lymph nodes in  the neck-right level IIA lymph node has slightly increased metabolic activity compared to prior examination.  Slightly decreased in size.  Small left level IIA lymph nodes substantially reducing activity compared to prior.  Previous right upper lobe hypermetabolic airspace opacity resolved.   Right axilla mass has reduced activity compared to prior examination.  Diffuse gastric activity is substantially improved from prior.  Other chronic changes. PET scan imaging was reviewed by me and discussed with patient and wife. Very small neck lymph nodes with stable size and relatively stable or improved activities. Recommend continue observation and repeat PET scan in 3 months.    # Port Cath in place. Port flush every 8 weeks.   #Chemotherapy-induced neuropathy, patient reports that symptom has improved.  Off gabapentin.  Patient feels that chiropractor treatments are helpful.  # AIDS patient follows with ID physician.   continue antiretroviral therapy   Supportive care measures are necessary for patient well-being and will be provided as necessary. We spent sufficient time to discuss many aspect of care, questions were answered to patient and wife's satisfaction.     All questions were answered. The patient knows to call the clinic with any problems questions or concerns.  cc Justin Wallace *  Follow-up  3 months repeat PET, lab MD after  Justin Server, MD, PhD Hematology Oncology  02/22/2022

## 2022-02-22 NOTE — Progress Notes (Signed)
[place] for Infectious Disease   CHIEF COMPLAINT    HIV follow up.    SUBJECTIVE:    Justin Wallace is a 76 y.o. male with PMHx as below who presents to the clinic for HIV follow up.   He is doing well today.  Having more energy after starting a healthier eating program last month.  No issues with his Biktarvy.  Please see A&P for the details of today's visit and status of the patient's medical problems.   Patient's Medications  New Prescriptions   No medications on file  Previous Medications   BICTEGRAVIR-EMTRICITABINE-TENOFOVIR AF (BIKTARVY) 50-200-25 MG TABS TABLET    TAKE 1 TABLET BY MOUTH DAILY.  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   Respiratory: Negative.    Cardiovascular: Negative.   Gastrointestinal: Negative.      OBJECTIVE:    Vitals:   02/22/22 0925  BP: 101/61  Pulse: 68  Resp: 16  SpO2: 98%  Weight: 161 lb 4.8 oz (73.2 kg)  Height: 5' 9"  (1.753 m)     Body mass index is 23.82 kg/m.  Physical Exam Constitutional:      Appearance: Normal appearance.  HENT:     Head: Normocephalic and atraumatic.  Pulmonary:     Effort: Pulmonary effort is normal. No respiratory distress.  Abdominal:     General: There is no distension.     Palpations: Abdomen is soft.  Skin:    General: Skin is warm and  dry.  Neurological:     General: No focal deficit present.     Mental Status: He is alert and oriented to person, place, and time.     Labs and Microbiology:    Latest Ref Rng & Units 02/08/2022    8:55 AM 12/11/2021    2:29 PM 08/28/2021   12:55 PM  CMP  Glucose 65 - 99 mg/dL 91  101  117   BUN 7 - 25 mg/dL 22  27  25    Creatinine 0.70 - 1.28 mg/dL 1.28  1.14  0.97   Sodium 135 - 146 mmol/L 141  137  137   Potassium 3.5 - 5.3 mmol/L 4.2  3.9  4.2   Chloride 98 - 110 mmol/L 107  110  106   CO2 20 - 32 mmol/L 26  24  25    Calcium 8.6 - 10.3 mg/dL 8.6  8.1  8.4   Total Protein 6.1 - 8.1 g/dL 5.8  6.2  6.1   Total Bilirubin 0.2 - 1.2 mg/dL 0.5  0.5  0.3   Alkaline Phos 38 - 126 U/L  67  77   AST 10 - 35 U/L 14  18  18   ALT 9 - 46 U/L 11  12  12        Latest Ref Rng & Units 02/08/2022    8:55 AM 12/11/2021    2:29 PM 08/28/2021   12:55 PM  CBC  WBC 3.8 - 10.8 Thousand/uL 4.8  5.3  5.5   Hemoglobin 13.2 - 17.1 g/dL 13.6  13.1  13.0   Hematocrit 38.5 - 50.0 % 40.6  38.4  38.1   Platelets 140 - 400 Thousand/uL 192  187  164      Lab Results  Component Value Date   HIV1RNAQUANT Not Detected 02/08/2022   HIV1RNAQUANT 28 (H) 07/18/2021   HIV1RNAQUANT 43 (H) 04/24/2021   CD4TABS 382 (L) 02/08/2022   CD4TABS 392 (L) 07/18/2021   CD4TABS 329 (L) 04/24/2021    RPR and STI: Lab Results  Component Value Date   LABRPR NON-REACTIVE 02/08/2022   LABRPR NON-REACTIVE 07/18/2021   LABRPR NON-REACTIVE 05/05/2020    STI Results GC CT  02/08/2022  9:05 AM Negative  Negative   07/18/2021 11:16 AM Negative  Negative   05/05/2020  9:53 AM Negative  Negative     Hepatitis B: Lab Results  Component Value Date   HEPBSAB NON-REACTIVE 05/05/2020   HEPBSAG NON-REACTIVE 05/05/2020   HEPBCAB NON-REACTIVE 05/05/2020   Hepatitis C: Lab Results  Component Value Date   HEPCAB NON-REACTIVE 05/05/2020   Hepatitis A: Lab Results  Component Value Date   HAV REACTIVE (A) 05/05/2020    Lipids: Lab Results  Component Value Date   CHOL 151 02/08/2022   TRIG 78 02/08/2022   HDL 35 (L) 02/08/2022   CHOLHDL 4.3 02/08/2022   LDLCALC 99 02/08/2022       ASSESSMENT & PLAN:    HIV disease ([place]) Currently on Biktarvy and taking consistently without adherence, tolerance, or access issues.  He had labs drawn 02/08/22 and his VL was undetectable, CD 4 count 382 (17%).  He has shown evidence of immune reconstitution since starting ART and his HIV is now under good control.  Will continue daily Biktarvy as ordered.  Follow up again in 6 months.   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  He has no symptoms really associated with this.  Seen by orthopedic oncology at [facility] on 02/06/22 and recommended biopsy which will be done 03/09/22.  Will continue to follow up with [facility] for now pending biopsy results.  Lymphoma [facility]) Patient follows with oncology Dr Tasia Catchings.  Recent PET scan done this week and follow up with Dr Tasia Catchings later today.    [name] for Infectious Disease [facility] 02/22/2022, 9:39 AM

## 2022-02-23 ENCOUNTER — Other Ambulatory Visit (HOSPITAL_COMMUNITY): Payer: Self-pay

## 2022-02-23 ENCOUNTER — Telehealth: Payer: Self-pay

## 2022-02-23 DIAGNOSIS — C4442 Squamous cell carcinoma of skin of scalp and neck: Secondary | ICD-10-CM | POA: Diagnosis not present

## 2022-02-23 NOTE — Telephone Encounter (Signed)
Please schedule patient for port flush every 8 weeks x 6. Please notify patient of appt. Thanks

## 2022-02-23 NOTE — Telephone Encounter (Signed)
-----   Message from Earlie Server, MD sent at 02/22/2022  9:09 PM EDT ----- Please schedule patient to get port flush Q8 weeks x 6

## 2022-02-26 ENCOUNTER — Ambulatory Visit: Payer: Medicare HMO | Admitting: Oncology

## 2022-02-26 ENCOUNTER — Encounter: Payer: Self-pay | Admitting: Family Medicine

## 2022-02-26 ENCOUNTER — Other Ambulatory Visit: Payer: Medicare HMO

## 2022-02-26 ENCOUNTER — Other Ambulatory Visit (HOSPITAL_COMMUNITY): Payer: Self-pay

## 2022-02-26 DIAGNOSIS — Z85828 Personal history of other malignant neoplasm of skin: Secondary | ICD-10-CM | POA: Insufficient documentation

## 2022-03-05 ENCOUNTER — Ambulatory Visit: Payer: Medicare HMO | Admitting: Oncology

## 2022-03-05 ENCOUNTER — Other Ambulatory Visit: Payer: Medicare HMO

## 2022-03-09 DIAGNOSIS — D481 Neoplasm of uncertain behavior of connective and other soft tissue: Secondary | ICD-10-CM | POA: Diagnosis not present

## 2022-03-09 DIAGNOSIS — L928 Other granulomatous disorders of the skin and subcutaneous tissue: Secondary | ICD-10-CM | POA: Diagnosis not present

## 2022-03-09 DIAGNOSIS — Z8572 Personal history of non-Hodgkin lymphomas: Secondary | ICD-10-CM | POA: Diagnosis not present

## 2022-03-09 DIAGNOSIS — M7989 Other specified soft tissue disorders: Secondary | ICD-10-CM | POA: Diagnosis not present

## 2022-03-16 ENCOUNTER — Other Ambulatory Visit (HOSPITAL_COMMUNITY): Payer: Self-pay

## 2022-03-19 ENCOUNTER — Other Ambulatory Visit (HOSPITAL_COMMUNITY): Payer: Self-pay

## 2022-03-23 DIAGNOSIS — Z21 Asymptomatic human immunodeficiency virus [HIV] infection status: Secondary | ICD-10-CM | POA: Diagnosis not present

## 2022-03-23 DIAGNOSIS — C4442 Squamous cell carcinoma of skin of scalp and neck: Secondary | ICD-10-CM | POA: Diagnosis not present

## 2022-03-23 DIAGNOSIS — Z1331 Encounter for screening for depression: Secondary | ICD-10-CM | POA: Diagnosis not present

## 2022-03-23 DIAGNOSIS — R590 Localized enlarged lymph nodes: Secondary | ICD-10-CM | POA: Diagnosis not present

## 2022-03-26 ENCOUNTER — Inpatient Hospital Stay: Payer: Medicare HMO | Attending: Oncology

## 2022-03-26 DIAGNOSIS — Z452 Encounter for adjustment and management of vascular access device: Secondary | ICD-10-CM | POA: Insufficient documentation

## 2022-03-26 DIAGNOSIS — Z95828 Presence of other vascular implants and grafts: Secondary | ICD-10-CM

## 2022-03-26 DIAGNOSIS — C8510 Unspecified B-cell lymphoma, unspecified site: Secondary | ICD-10-CM | POA: Insufficient documentation

## 2022-03-26 MED ORDER — SODIUM CHLORIDE 0.9% FLUSH
10.0000 mL | Freq: Once | INTRAVENOUS | Status: AC
  Administered 2022-03-26: 10 mL via INTRAVENOUS
  Filled 2022-03-26: qty 10

## 2022-03-26 MED ORDER — HEPARIN SOD (PORK) LOCK FLUSH 100 UNIT/ML IV SOLN
500.0000 [IU] | Freq: Once | INTRAVENOUS | Status: AC
  Administered 2022-03-26: 500 [IU] via INTRAVENOUS
  Filled 2022-03-26: qty 5

## 2022-03-28 ENCOUNTER — Other Ambulatory Visit (HOSPITAL_COMMUNITY): Payer: Self-pay

## 2022-04-03 DIAGNOSIS — R590 Localized enlarged lymph nodes: Secondary | ICD-10-CM | POA: Diagnosis not present

## 2022-04-03 DIAGNOSIS — A159 Respiratory tuberculosis unspecified: Secondary | ICD-10-CM | POA: Diagnosis not present

## 2022-04-03 DIAGNOSIS — D481 Neoplasm of uncertain behavior of connective and other soft tissue: Secondary | ICD-10-CM | POA: Diagnosis not present

## 2022-04-17 ENCOUNTER — Other Ambulatory Visit (HOSPITAL_COMMUNITY): Payer: Self-pay

## 2022-04-26 ENCOUNTER — Other Ambulatory Visit (HOSPITAL_COMMUNITY): Payer: Self-pay

## 2022-05-04 DIAGNOSIS — Z1331 Encounter for screening for depression: Secondary | ICD-10-CM | POA: Diagnosis not present

## 2022-05-04 DIAGNOSIS — R599 Enlarged lymph nodes, unspecified: Secondary | ICD-10-CM | POA: Diagnosis not present

## 2022-05-21 ENCOUNTER — Other Ambulatory Visit (HOSPITAL_COMMUNITY): Payer: Self-pay

## 2022-05-21 ENCOUNTER — Other Ambulatory Visit: Payer: Self-pay | Admitting: Internal Medicine

## 2022-05-21 ENCOUNTER — Inpatient Hospital Stay: Payer: Medicare HMO

## 2022-05-21 MED ORDER — BIKTARVY 50-200-25 MG PO TABS
1.0000 | ORAL_TABLET | Freq: Every day | ORAL | 5 refills | Status: DC
  Filled 2022-05-21: qty 30, 30d supply, fill #0
  Filled 2022-06-20: qty 30, 30d supply, fill #1
  Filled 2022-07-19: qty 30, 30d supply, fill #2
  Filled 2022-08-15: qty 30, 30d supply, fill #3

## 2022-05-23 ENCOUNTER — Other Ambulatory Visit (HOSPITAL_COMMUNITY): Payer: Self-pay

## 2022-05-28 ENCOUNTER — Encounter (INDEPENDENT_AMBULATORY_CARE_PROVIDER_SITE_OTHER): Payer: Self-pay

## 2022-05-28 ENCOUNTER — Telehealth: Payer: Self-pay | Admitting: *Deleted

## 2022-05-28 ENCOUNTER — Inpatient Hospital Stay: Payer: Medicare HMO | Attending: Oncology

## 2022-05-28 DIAGNOSIS — Z452 Encounter for adjustment and management of vascular access device: Secondary | ICD-10-CM | POA: Diagnosis not present

## 2022-05-28 DIAGNOSIS — C8510 Unspecified B-cell lymphoma, unspecified site: Secondary | ICD-10-CM | POA: Diagnosis not present

## 2022-05-28 DIAGNOSIS — Z95828 Presence of other vascular implants and grafts: Secondary | ICD-10-CM

## 2022-05-28 MED ORDER — HEPARIN SOD (PORK) LOCK FLUSH 100 UNIT/ML IV SOLN
500.0000 [IU] | Freq: Once | INTRAVENOUS | Status: AC
  Administered 2022-05-28: 500 [IU] via INTRAVENOUS
  Filled 2022-05-28: qty 5

## 2022-05-28 MED ORDER — SODIUM CHLORIDE 0.9% FLUSH
10.0000 mL | Freq: Once | INTRAVENOUS | Status: AC
  Administered 2022-05-28: 10 mL via INTRAVENOUS
  Filled 2022-05-28: qty 10

## 2022-05-28 NOTE — Patient Outreach (Signed)
  Care Coordination   05/28/2022 Name: Justin Wallace MRN: [id number] DOB: 19-Nov-1945   Care Coordination Outreach Attempts:  An unsuccessful telephone outreach was attempted today to offer the patient information about available care coordination services as a benefit of their health plan.   Follow Up Plan:  No further outreach attempts will be made at this time. We have been unable to contact the patient to offer or enroll patient in care coordination services Noted that PCP has changed.  Encounter Outcome:  No Answer  Care Coordination Interventions Activated:  No   Care Coordination Interventions:  No, not indicated    Valente David, RN, MSN, [facility] Care Management Care Management Coordinator [phone]

## 2022-05-29 ENCOUNTER — Other Ambulatory Visit (HOSPITAL_COMMUNITY): Payer: Self-pay

## 2022-06-20 ENCOUNTER — Other Ambulatory Visit (HOSPITAL_COMMUNITY): Payer: Self-pay

## 2022-06-25 ENCOUNTER — Inpatient Hospital Stay: Payer: Medicare HMO | Attending: Oncology

## 2022-06-25 ENCOUNTER — Encounter
Admission: RE | Admit: 2022-06-25 | Discharge: 2022-06-25 | Disposition: A | Discharge status: Home / Self Care | Payer: Medicare HMO | Source: Ambulatory Visit | Attending: Oncology | Admitting: Oncology

## 2022-06-25 DIAGNOSIS — K900 Celiac disease: Secondary | ICD-10-CM | POA: Insufficient documentation

## 2022-06-25 DIAGNOSIS — C8511 Unspecified B-cell lymphoma, lymph nodes of head, face, and neck: Secondary | ICD-10-CM | POA: Diagnosis not present

## 2022-06-25 DIAGNOSIS — C8510 Unspecified B-cell lymphoma, unspecified site: Secondary | ICD-10-CM | POA: Diagnosis not present

## 2022-06-25 DIAGNOSIS — B20 Human immunodeficiency virus [HIV] disease: Secondary | ICD-10-CM | POA: Diagnosis not present

## 2022-06-25 LAB — CBC WITH DIFFERENTIAL/PLATELET
Abs Immature Granulocytes: 0.02 10*3/uL (ref 0.00–0.07)
Basophils Absolute: 0 10*3/uL (ref 0.0–0.1)
Basophils Relative: 1 %
Eosinophils Absolute: 0.1 10*3/uL (ref 0.0–0.5)
Eosinophils Relative: 2 %
HCT: 42.6 % (ref 39.0–52.0)
Hemoglobin: 14.6 g/dL (ref 13.0–17.0)
Immature Granulocytes: 0 %
Lymphocytes Relative: 34 %
Lymphs Abs: 1.8 10*3/uL (ref 0.7–4.0)
MCH: 32.2 pg (ref 26.0–34.0)
MCHC: 34.3 g/dL (ref 30.0–36.0)
MCV: 94 fL (ref 80.0–100.0)
Monocytes Absolute: 0.5 10*3/uL (ref 0.1–1.0)
Monocytes Relative: 10 %
Neutro Abs: 2.7 10*3/uL (ref 1.7–7.7)
Neutrophils Relative %: 53 %
Platelets: 218 10*3/uL (ref 150–400)
RBC: 4.53 MIL/uL (ref 4.22–5.81)
RDW: 13.2 % (ref 11.5–15.5)
WBC: 5.1 10*3/uL (ref 4.0–10.5)
nRBC: 0 % (ref 0.0–0.2)

## 2022-06-25 LAB — COMPREHENSIVE METABOLIC PANEL
ALT: 11 U/L (ref 0–44)
AST: 17 U/L (ref 15–41)
Albumin: 3.6 g/dL (ref 3.5–5.0)
Alkaline Phosphatase: 76 U/L (ref 38–126)
Anion gap: 3 — ABNORMAL LOW (ref 5–15)
BUN: 24 mg/dL — ABNORMAL HIGH (ref 8–23)
CO2: 27 mmol/L (ref 22–32)
Calcium: 8.7 mg/dL — ABNORMAL LOW (ref 8.9–10.3)
Chloride: 108 mmol/L (ref 98–111)
Creatinine, Ser: 1.12 mg/dL (ref 0.61–1.24)
GFR, Estimated: >60 mL/min (ref >60)
Glucose, Bld: 103 mg/dL — ABNORMAL HIGH (ref 70–99)
Potassium: 4.3 mmol/L (ref 3.5–5.1)
Sodium: 138 mmol/L (ref 135–145)
Total Bilirubin: 0.5 mg/dL (ref 0.3–1.2)
Total Protein: 6.8 g/dL (ref 6.5–8.1)

## 2022-06-25 LAB — GLUCOSE, CAPILLARY: Glucose-Capillary: 95 mg/dL (ref 70–99)

## 2022-06-25 LAB — LACTATE DEHYDROGENASE: LDH: 114 U/L (ref 98–192)

## 2022-06-25 MED ORDER — FLUDEOXYGLUCOSE F - 18 (FDG) INJECTION
8.4000 | Freq: Once | INTRAVENOUS | Status: AC
  Administered 2022-06-25: 9.1 via INTRAVENOUS

## 2022-06-27 LAB — COMP PANEL: LEUKEMIA/LYMPHOMA

## 2022-06-28 ENCOUNTER — Other Ambulatory Visit (HOSPITAL_COMMUNITY): Payer: Self-pay

## 2022-06-28 ENCOUNTER — Inpatient Hospital Stay: Payer: Medicare HMO | Admitting: Oncology

## 2022-06-28 ENCOUNTER — Encounter: Payer: Self-pay | Admitting: Oncology

## 2022-06-28 VITALS — BP 114/68 | HR 54 | Temp 96.7°F | Wt 171.7 lb

## 2022-06-28 DIAGNOSIS — K900 Celiac disease: Secondary | ICD-10-CM | POA: Diagnosis not present

## 2022-06-28 DIAGNOSIS — Z95828 Presence of other vascular implants and grafts: Secondary | ICD-10-CM | POA: Insufficient documentation

## 2022-06-28 DIAGNOSIS — R2231 Localized swelling, mass and lump, right upper limb: Secondary | ICD-10-CM | POA: Diagnosis not present

## 2022-06-28 DIAGNOSIS — B20 Human immunodeficiency virus [HIV] disease: Secondary | ICD-10-CM | POA: Diagnosis not present

## 2022-06-28 DIAGNOSIS — R590 Localized enlarged lymph nodes: Secondary | ICD-10-CM | POA: Diagnosis not present

## 2022-06-28 DIAGNOSIS — C8510 Unspecified B-cell lymphoma, unspecified site: Secondary | ICD-10-CM

## 2022-06-28 NOTE — Progress Notes (Signed)
Patient is here for follow-up of PET Scan results.

## 2022-06-28 NOTE — Assessment & Plan Note (Signed)
AIDS patient follows with ID physician.   continue antiretroviral therapy

## 2022-06-28 NOTE — Assessment & Plan Note (Signed)
Biopsy showed necrotizing granuloma. + AFB stating negative growth.  Not lymphoma. Follow up with ID

## 2022-06-28 NOTE — Assessment & Plan Note (Signed)
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 Status post 6 cycles of dose adjusted EPOCH with rituximab.  Patient had +1 dose level increase for cycle 2/cycle 3, +2 dose level increase for cycle 4, +1 dose level for cycle 5 and 6. Intrathecal MTX Patient is doing well.  No constitutional symptoms PET scan imaging was reviewed by me and discussed with patient and wife. Very small neck lymph nodes with stable size and but increased activities. Recommend continue observation and repeat CT neck in 3 months. Repeat PET in 6 months.

## 2022-06-28 NOTE — Progress Notes (Signed)
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 IHC MYC+, BCL 6+, negative translocation. Status post 6 cycles of dose adjusted EPOCH with rituximab.  Patient had +1 dose level increase for cycle 2/cycle 3, +2 dose level increase for cycle 4, +1 dose level for cycle 5 and 6. Intrathecal MTX Patient is doing well.  No constitutional symptoms PET scan imaging was reviewed by me and discussed with patient and wife. Very small neck lymph nodes with stable size and but increased activities. Recommend continue observation and repeat CT neck in 3 months. Repeat PET in 6 months.   HIV disease ([place]) AIDS patient follows with ID physician.   continue antiretroviral therapy   Axillary mass, right Biopsy showed necrotizing granuloma. + AFB stating negative growth.  Not lymphoma. Follow up with ID  Port-A-Cath in place Port flush every 8 weeks.   Orders Placed This Encounter  Procedures   CT SOFT TISSUE NECK W WO CONTRAST    Standing Status:   Future    Standing Expiration Date:   06/29/2023    Order Specific Question:   If indicated for the ordered procedure, I authorize the administration of contrast media per Radiology protocol    Answer:   Yes    Order Specific Question:   Does the patient have a contrast media/X-ray dye allergy?    Answer:   Yes    Order Specific Question:   Preferred imaging location?    Answer:   [facility]   NM PET Image Restag (PS) Skull Base To Thigh    Standing Status:   Future    Standing Expiration Date:   06/28/2023    Order Specific Question:   If indicated for the ordered procedure, I authorize the administration of a  radiopharmaceutical per Radiology protocol    Answer:   Yes    Order Specific Question:   Preferred imaging location?    Answer:   [facility]   CBC with Differential/Platelet    Standing Status:   Future    Standing Expiration Date:   06/29/2023   Comprehensive metabolic panel    Standing Status:   Future    Standing Expiration Date:   06/28/2023   Lactate dehydrogenase    Standing Status:   Future    Standing Expiration Date:   06/29/2023   Flow cytometry panel-leukemia/lymphoma work-up    Standing Status:   Future    Standing Expiration Date:   06/29/2023   Follow up per LOS All questions were answered. The patient knows to call the clinic with any problems, questions or concerns.  Earlie Server, MD, PhD [facility] Hematology Oncology 06/28/2022   CHIEF COMPLAINTS/REASON FOR VISIT:  Follow up for HIV related high-grade B-cell lymphoma   HISTORY OF PRESENTING ILLNESS:   Justin Wallace is a  76 y.o.  male with PMH listed below was seen in consultation at the request of  Nobie Putnam *  for evaluation of symptomatic anemia Patient reported history of sudden onset of diffuse body aches, decreased appetite, headache low-grade fever, profound weakness/fatigue about 2 weeks ago.  Prior to the onset of symptoms, he went to a funeral as well as applicable clinic.  He reports that he has  received COVID-19 vaccination previously.  He moved from [place].  Daughter is an Therapist, sports and told him to get COVID-19 PCR checked and he was tested negative. Patient also reports a sudden drop of weight since the onset of symptoms.  Patient has a history of celiac disease.  No colonoscopy records or pathology records in current EMR.  Diagnosis was done by [facility] gastroenterology.  Patient reports that he was in his usual state of health until the onset of symptoms.  03/10/2020, patient was evaluated by primary care provider.  Blood work showed mild anemia with hemoglobin of 13.1, Iron panel showed  decreased saturation of 8, ferritin 218, TIBC 291, patient was referred to hematology for evaluation of symptomatic anemia and iron deficiency.  Patient reports that her body aches, fever and headache symptoms have improved.  However he continues to feel very tired and fatigued.  Appetite has decreased and weight remains low.  # Physical examination showed left axillary mass  ultrasound confirmed a left axillary large soft tissue mass up to 6.8 cm with internal vascularity. 03/30/2020 CT chest with contrast showed soft tissue mass/enlarged lymph node in the left axilla 6.3 cm, concerning for primary mass or metastatic lymphadenopathy.  There are additional prominent although much smaller right axillary lymph nodes, pretracheal and superior mediastinal lymph nodes.  Findings are concerning for malignancy such as lymphoma or nodal metastatic disease.  # treated for Lymes disease, finished doxycycline 140m BID for 21 days course. 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 he has started treatment with Biktarvy, also started on Bactrim daily.  04/26/2020 PET scan showed large intensely hypermetabolic left axillary mass SUV 28.  Additional small bilateral axilla lymph node with mild metabolic activity SUV 2.  Metabolic activity through the porta hepatis region without enlarged nodes identified.  No bone lesions. 05/02/2020 bone marrow biopsy showed hypercellular marrow for age with trilineage hematopoiesis.  Increased number of CD10 positive B cells, cannot rule out minimal or subtle involvement of marrow by the high-grade B-cell lymphoma. Normal cytogenetics. 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  Positive for BCL6 and MYC expression.  FISH testing showed negative for BCL-2/BCL6/MYC gene rearrangement.   Trisomy 3 and 18 were identified and are nonspecific findings seen in clonal lymphoid neoplasm.  Final results were signed out on  05/30/2020-due to the delay of  FISH results.  [place], lymphoma is best classified as high-grade B-cell lymphoma not otherwise specified.  It does not meet morphological criteria for diffuse large B-cell lymphoma nor molecular criteria for Burkitt lymphoma.  11 q. alteration testing has been sent.  Results are pending. 04/26/2020, MUGA testing showed normal LVEF of 56% with normal LV wall motion.   05/13/2020, echocardiogram showed low normal end ejection fraction of 50 to 55%.  #04/19/2020 Mediport placed by Dr. DLucky Cowboy# Chemotherapy # 06/06/2020 cycle 1 R-EPOCH +GCSF                    Intrathecal MTX # 06/27/2020 cycle 2 R- EPOCH +GCSF [+ 1 level]  Intrathecal MTX # 07/18/2020 cycle 3 R -EPOCH +GCSF [+ 1 level]  Intrathecal MTX # 08/03/2020 PET negative. Deuville 3 # 08/08/2020 cycle 4 R EPOCH +GCSF  [+ 2 level]  Intrathecal MTX- did not tolerate due to cytopenia and mucositis # 08/29/2020 cycle 5 R -EPOCH +GCSF [+ 1 level]  Intrathecal MTX # 09/19/2020 cycle 6 R -EPOCH +GCSF [+ 1 level]  Intrathecal MTX  5/5/ 2022 Post treatment PET  - Reduced size and activity of the prior left axillary lymph node, currently Deauville 2 - Deauville 5 activity in a low-density collection adjacent to the right proximal biceps muscle - [place] Deauville 4 left inguinal lymph nodes   02/20/2021 follow up PET scan- Deauville 2 -Continued decrease in size and abnormal FDG activity in the index left axillary lymph node -near complete resolution of the abnormal FDG avidity in the left inguinal lymph nodes - Decreased hypermetabolic activity associated with the low-density fluid collection along the right proximal bicep muscle, favored to represent synovitis/bursitis   #10/03/2021, upper endoscopy showed congested and nodular mucosa in the greater curvature, antrum and prepyloric region of the stomach.  Biopsied. 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  09/05/2021, PET showed diffuse hypermetabolic activity throughout the stomach.-Endoscopy biopsy showed gastritis. Low-density peripheral hypermetabolic lesion in the right shoulder anterior to the biceps muscle-this was previously thought to be bursitis/synovitis.-I will obtain right axillary ultrasound and likely needle biopsy. New segmental nodular airspace in the right upper lobe-likely secondary to infection, patient was treated with a course of antibiotics and symptom resolved.  02/19/2022, PET scan showed small dural 4 lymph nodes in the neck-right level IIA lymph node has slightly increased metabolic activity compared to prior examination.  Slightly decreased in size.  Small left level IIA lymph nodes substantially reducing activity compared to prior.  Previous right upper lobe hypermetabolic airspace opacity resolved.   Right axilla mass has reduced activity compared to prior examination.  Diffuse gastric activity is substantially improved from prior.  Other chronic changes.  INTERVAL HISTORY Justin Wallace is a 76 y.o. male who has above history reviewed by me today presents for follow up visit for high-grade B-cell lymphoma  He reports feeling well. + Right axillary hypermetabolic cystic mass. 03/09/2022, patient underwent right axillary cystic mass biopsy, pathology showed necrotizing granulomatous, AFB stain positive for mycobacteria. 04/03/2022, repeat biopsy was performed and stained positive but culture without growth. 05/04/2022, he reported to ID doctor that he had pus drained from the node after his September 2023 biopsy and this relieved some of his symptoms.  Pain is much better now.  The mass has decreased in size.  He is now currently has minimal drainage.  Patient has gained weight, no night sweats, or fever.  06/25/22 PET showed   1. Similar size of hypermetabolic small cervical nodes. The right-sided node is slightly increased in hypermetabolism today. (Deauville) 5  2. No new sites of disease identified. 3. Increased hypermetabolism within the stomach, without CT correlate, suggesting gastritis. 4. Persistent hypermetabolism about the anterior right deltoid muscle, at the site of less well-defined fluid/edema than on the prior exam, nonspecific. Possibly a ganglion cyst. 5. Incidental findings, including: Prostatomegaly. Aortic Atherosclerosis    Review of Systems  Constitutional:  Negative for appetite change, chills, fatigue, fever and unexpected weight change.  HENT:   Negative for hearing loss, mouth sores and voice change.   Eyes:  Negative for eye problems and icterus.  Respiratory:  Negative for chest tightness, cough and shortness of breath.   Cardiovascular:  Negative for chest pain and leg swelling.  Gastrointestinal:  Negative for abdominal distention and abdominal pain.  Endocrine: Negative for hot flashes.  Genitourinary:  Negative for difficulty urinating, dysuria and frequency.   Musculoskeletal:  Negative for arthralgias.  Skin:  Negative for itching and rash.  Neurological:  Positive for numbness. Negative for light-headedness.  Hematological:  Negative for adenopathy. Does not bruise/bleed easily.  Psychiatric/Behavioral:  Negative for confusion.     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[place]) 05/27/2020   HIV (human immunodeficiency virus infection) ([place])    Lyme disease    Lymphoma ([place])     SURGICAL HISTORY: Past Surgical History:  Procedure Laterality Date   ESOPHAGOGASTRODUODENOSCOPY (EGD) WITH PROPOFOL N/A 10/03/2021   Procedure: ESOPHAGOGASTRODUODENOSCOPY (EGD) WITH PROPOFOL;  Surgeon: Jonathon Bellows, MD;  [facility] ENDOSCOPY;  Service: Gastroenterology;  Laterality: N/A;   PORTA CATH INSERTION N/A 04/25/2020    Procedure: PORTA CATH INSERTION;  Surgeon: Algernon Huxley, MD;  Location: [facility];  Service: Cardiovascular;  Laterality: N/A;    SOCIAL HISTORY: Social History   Socioeconomic History   Marital status: Married    Spouse name: Opal Sidles    Number of children: 2   Years of education: Not on file   Highest education level: Not on file  Occupational History   Not on file  Tobacco Use   Smoking status: Never   Smokeless tobacco: Never  Vaping Use   Vaping Use: Never used  Substance and Sexual Activity   Alcohol use: Yes    Comment: occasional   Drug use: Never   Sexual activity: Never  Other Topics Concern   Not on file  Social History Narrative   Not on file   Social Determinants of Health   Financial Resource Strain: Not on file  Food Insecurity: Not on file  Transportation Needs: Not on file  Physical Activity: Not on file  Stress: Not on file  Social Connections: Not on file  Intimate Partner Violence: Not on file    FAMILY HISTORY: Family History  Problem Relation Age of Onset   Hyperlipidemia Mother    Brain cancer Father     ALLERGIES:  has No Known Allergies.  MEDICATIONS:  Current Outpatient Medications  Medication Sig Dispense Refill   bictegravir-emtricitabine-tenofovir AF (BIKTARVY) 50-200-25 MG TABS tablet TAKE 1 TABLET BY MOUTH DAILY. 30 tablet 5   Multiple Vitamin (MULTIVITAMIN ADULT PO) Take 1 tablet by mouth daily.     No current facility-administered medications for this visit.     PHYSICAL EXAMINATION: ECOG PERFORMANCE STATUS: 1 - Symptomatic but completely ambulatory Vitals:   06/28/22 0949  BP: 114/68  Pulse: (!) 54  Temp: (!) 96.7 F (35.9 C)  SpO2: 99%   Filed Weights   06/28/22 0949  Weight: 171 lb 11.2 oz (77.9 kg)    Physical Exam Constitutional:      General: He is not in acute distress. HENT:     Head: Normocephalic and atraumatic.  Eyes:     General: No scleral icterus. Cardiovascular:     Rate and  Rhythm: Normal rate and regular rhythm.     Heart sounds: Normal heart sounds.  Pulmonary:     Effort: Pulmonary effort is normal. No respiratory distress.     Breath sounds: No wheezing.  Abdominal:     General: Bowel sounds are normal. There is no distension.     Palpations: Abdomen is soft.  Musculoskeletal:        General: No deformity. Normal range of motion.     Cervical back: Normal range of motion and neck supple.  Comments: Right axillary mass has decreased in size, minimal clear drainage from previous biopsy site.  Skin:    General: Skin is warm and dry.     Findings: No erythema or rash.  Neurological:     Mental Status: He is alert and oriented to person, place, and time. Mental status is at baseline.     Cranial Nerves: No cranial nerve deficit.     Coordination: Coordination normal.  Psychiatric:        Mood and Affect: Mood normal.       LABORATORY DATA:  I have reviewed the data as listed    Latest Ref Rng & Units 06/25/2022    8:46 AM 02/22/2022   10:41 AM 02/08/2022    8:55 AM  CBC  WBC 4.0 - 10.5 K/uL 5.1  5.4  4.8   Hemoglobin 13.0 - 17.0 g/dL 14.6  13.5  13.6   Hematocrit 39.0 - 52.0 % 42.6  39.0  40.6   Platelets 150 - 400 K/uL 218  178  192        Latest Ref Rng & Units 06/25/2022    8:46 AM 02/22/2022   10:41 AM 02/08/2022    8:55 AM  CMP  Glucose 70 - 99 mg/dL 103  125  91   BUN 8 - 23 mg/dL _0 Creatinine 0.61 - 1.24 mg/dL 1.12  1.15  1.28   Sodium 135 - 145 mmol/L 138  139  141   Potassium 3.5 - 5.1 mmol/L 4.3  4.4  4.2   Chloride 98 - 111 mmol/L 108  108  107   CO2 22 - 32 mmol/L _1 Calcium 8.9 - 10.3 mg/dL 8.7  8.8  8.6   Total Protein 6.5 - 8.1 g/dL 6.8  6.2  5.8   Total Bilirubin 0.3 - 1.2 mg/dL 0.5  0.9  0.5   Alkaline Phos 38 - 126 U/L 76  62    AST 15 - 41 U/L _2 ALT 0 - 44 U/L _3 RADIOGRAPHIC STUDIES: I have personally reviewed the radiological images as listed and agreed with  the findings in the report. NM PET Image Restag (PS) Skull Base To Thigh  Result Date: 06/25/2022 CLINICAL DATA:  Subsequent treatment strategy for B-cell lymphoma, status post right axillary node biopsy. EXAM: NUCLEAR MEDICINE PET SKULL BASE TO THIGH TECHNIQUE: 9.1 mCi F-18 FDG was injected intravenously. Full-ring PET imaging was performed from the skull base to thigh after the radiotracer. CT data was obtained and used for attenuation correction and anatomic localization. Fasting blood glucose: 95 mg/dl COMPARISON:  02/19/2022 FINDINGS: Mediastinal blood pool activity: SUV max 1.1 Liver activity: SUV max 2.0 NECK: Right level 2A hypermetabolic node measures 6 mm and a S.U.V. max of 5.1 on 40/306. Compare similar in size and a S.U.V. max of 3.0 on the prior. Left-sided level 2A nodes measure maximally 5 mm and a S.U.V. max of 3.0 on 38/306 versus similar in size and similar in hypermetabolism on the prior. Incidental CT findings: No cervical adenopathy. CHEST: No pulmonary parenchymal or thoracic nodal hypermetabolism. Incidental CT findings: Right Port-A-Cath tip mid right atrium. Mild cardiomegaly. Aortic atherosclerosis. ABDOMEN/PELVIS: No abdominopelvic nodal hypermetabolism. Proximal gastric hypermetabolism without well-defined mass. Example at a S.U.V. max of 8.3 today versus a S.U.V. max of 5.0 on the prior. Incidental CT findings:  Normal adrenal glands. Fluid density left renal lesions are likely cysts. A minimally complex Bosniak 2 cyst of 6.4 cm on 159/2 is similar in size and morphology to on the prior . In the absence of clinically indicated signs/symptoms require(s) no independent follow-up. Abdominal aortic atherosclerosis. Prostatomegaly. SKELETON: Hypermetabolism about the anterior aspect of the right deltoid musculature corresponds to ill-defined fluid and edema. Example at a S.U.V. max of 2.8 on 75/2. This is at the site of more well-defined fluid on the prior exam, where it measured a S.U.V.  max of 2.9. No marrow hypermetabolism identified. Incidental CT findings: None. IMPRESSION: 1. Similar size of hypermetabolic small cervical nodes. The right-sided node is slightly increased in hypermetabolism today. (Deauville) 5 2. No new sites of disease identified. 3. Increased hypermetabolism within the stomach, without CT correlate, suggesting gastritis. 4. Persistent hypermetabolism about the anterior right deltoid muscle, at the site of less well-defined fluid/edema than on the prior exam, nonspecific. Possibly a ganglion cyst. 5. Incidental findings, including: Prostatomegaly. Aortic Atherosclerosis (ICD10-I70.0). Electronically Signed   By: Abigail Miyamoto M.D.   On: 06/25/2022 16:20    NM PET Image Restag (PS) Skull Base To Thigh  Result Date: 06/25/2022 CLINICAL DATA:  Subsequent treatment strategy for B-cell lymphoma, status post right axillary node biopsy. EXAM: NUCLEAR MEDICINE PET SKULL BASE TO THIGH TECHNIQUE: 9.1 mCi F-18 FDG was injected intravenously. Full-ring PET imaging was performed from the skull base to thigh after the radiotracer. CT data was obtained and used for attenuation correction and anatomic localization. Fasting blood glucose: 95 mg/dl COMPARISON:  02/19/2022 FINDINGS: Mediastinal blood pool activity: SUV max 1.1 Liver activity: SUV max 2.0 NECK: Right level 2A hypermetabolic node measures 6 mm and a S.U.V. max of 5.1 on 40/306. Compare similar in size and a S.U.V. max of 3.0 on the prior. Left-sided level 2A nodes measure maximally 5 mm and a S.U.V. max of 3.0 on 38/306 versus similar in size and similar in hypermetabolism on the prior. Incidental CT findings: No cervical adenopathy. CHEST: No pulmonary parenchymal or thoracic nodal hypermetabolism. Incidental CT findings: Right Port-A-Cath tip mid right atrium. Mild cardiomegaly. Aortic atherosclerosis. ABDOMEN/PELVIS: No abdominopelvic nodal hypermetabolism. Proximal gastric hypermetabolism without well-defined mass. Example  at a S.U.V. max of 8.3 today versus a S.U.V. max of 5.0 on the prior. Incidental CT findings: Normal adrenal glands. Fluid density left renal lesions are likely cysts. A minimally complex Bosniak 2 cyst of 6.4 cm on 159/2 is similar in size and morphology to on the prior . In the absence of clinically indicated signs/symptoms require(s) no independent follow-up. Abdominal aortic atherosclerosis. Prostatomegaly. SKELETON: Hypermetabolism about the anterior aspect of the right deltoid musculature corresponds to ill-defined fluid and edema. Example at a S.U.V. max of 2.8 on 75/2. This is at the site of more well-defined fluid on the prior exam, where it measured a S.U.V. max of 2.9. No marrow hypermetabolism identified. Incidental CT findings: None. IMPRESSION: 1. Similar size of hypermetabolic small cervical nodes. The right-sided node is slightly increased in hypermetabolism today. (Deauville) 5 2. No new sites of disease identified. 3. Increased hypermetabolism within the stomach, without CT correlate, suggesting gastritis. 4. Persistent hypermetabolism about the anterior right deltoid muscle, at the site of less well-defined fluid/edema than on the prior exam, nonspecific. Possibly a ganglion cyst. 5. Incidental findings, including: Prostatomegaly. Aortic Atherosclerosis (ICD10-I70.0). Electronically Signed   By: Abigail Miyamoto M.D.   On: 06/25/2022 16:20

## 2022-06-28 NOTE — Assessment & Plan Note (Signed)
Port flush every 8 weeks.

## 2022-07-16 ENCOUNTER — Inpatient Hospital Stay: Payer: Medicare HMO | Attending: Oncology

## 2022-07-16 DIAGNOSIS — C8510 Unspecified B-cell lymphoma, unspecified site: Secondary | ICD-10-CM | POA: Insufficient documentation

## 2022-07-16 DIAGNOSIS — Z452 Encounter for adjustment and management of vascular access device: Secondary | ICD-10-CM | POA: Insufficient documentation

## 2022-07-16 DIAGNOSIS — Z95828 Presence of other vascular implants and grafts: Secondary | ICD-10-CM

## 2022-07-16 MED ORDER — HEPARIN SOD (PORK) LOCK FLUSH 100 UNIT/ML IV SOLN
500.0000 [IU] | Freq: Once | INTRAVENOUS | Status: AC
  Administered 2022-07-16: 500 [IU] via INTRAVENOUS
  Filled 2022-07-16: qty 5

## 2022-07-16 MED ORDER — SODIUM CHLORIDE 0.9% FLUSH
10.0000 mL | Freq: Once | INTRAVENOUS | Status: AC
  Administered 2022-07-16: 10 mL via INTRAVENOUS
  Filled 2022-07-16: qty 10

## 2022-07-17 ENCOUNTER — Other Ambulatory Visit (HOSPITAL_COMMUNITY): Payer: Self-pay

## 2022-07-19 ENCOUNTER — Other Ambulatory Visit: Payer: Self-pay

## 2022-07-25 ENCOUNTER — Other Ambulatory Visit: Payer: Self-pay

## 2022-07-26 ENCOUNTER — Other Ambulatory Visit (HOSPITAL_COMMUNITY): Payer: Self-pay

## 2022-08-15 ENCOUNTER — Other Ambulatory Visit (HOSPITAL_COMMUNITY): Payer: Self-pay

## 2022-08-22 ENCOUNTER — Ambulatory Visit: Payer: Medicare HMO | Admitting: Internal Medicine

## 2022-08-22 ENCOUNTER — Encounter: Payer: Self-pay | Admitting: Internal Medicine

## 2022-08-22 ENCOUNTER — Other Ambulatory Visit (HOSPITAL_COMMUNITY): Payer: Self-pay

## 2022-08-22 ENCOUNTER — Other Ambulatory Visit: Payer: Self-pay

## 2022-08-22 VITALS — BP 106/69 | HR 65 | Temp 97.7°F | Ht 69.0 in | Wt 174.0 lb

## 2022-08-22 DIAGNOSIS — B20 Human immunodeficiency virus [HIV] disease: Secondary | ICD-10-CM | POA: Diagnosis not present

## 2022-08-22 DIAGNOSIS — R2231 Localized swelling, mass and lump, right upper limb: Secondary | ICD-10-CM

## 2022-08-22 MED ORDER — BIKTARVY 50-200-25 MG PO TABS
1.0000 | ORAL_TABLET | Freq: Every day | ORAL | 11 refills | Status: DC
  Filled 2022-08-22 – 2022-09-04 (×4): qty 30, 30d supply, fill #0
  Filled 2022-09-24: qty 30, 30d supply, fill #1
  Filled 2022-10-30: qty 30, 30d supply, fill #2
  Filled 2022-11-27: qty 30, 30d supply, fill #3
  Filled 2023-01-02: qty 30, 30d supply, fill #4
  Filled 2023-02-05 – 2023-02-07 (×2): qty 30, 30d supply, fill #5
  Filled 2023-03-13: qty 30, 30d supply, fill #6
  Filled 2023-04-10: qty 30, 30d supply, fill #7
  Filled 2023-05-06: qty 30, 30d supply, fill #8
  Filled 2023-06-12: qty 30, 30d supply, fill #9
  Filled 2023-07-16: qty 30, 30d supply, fill #10
  Filled 2023-08-20: qty 30, 30d supply, fill #11

## 2022-08-22 NOTE — Assessment & Plan Note (Signed)
Doing well on Biktarvy and no issues noted.  Will obtain repeat labs today but was undetectable with CD4 count of 382 in July 2023.  Refills sent today as well and follow up in 12 months.

## 2022-08-22 NOTE — Progress Notes (Signed)
[facility]   CHIEF COMPLAINT    HIV follow up.    SUBJECTIVE:    Justin Wallace is a 77 y.o. male with PMHx as below who presents to the clinic for HIV follow up.   Please see A&P for the details of today's visit and status of the patient's medical problems.   Patient's Medications  New Prescriptions   No medications on file  Previous Medications   MULTIPLE VITAMIN (MULTIVITAMIN ADULT PO)    Take 1 tablet by mouth daily.  Modified Medications   Modified Medication Previous Medication   BICTEGRAVIR-EMTRICITABINE-TENOFOVIR AF (BIKTARVY) 50-200-25 MG TABS TABLET bictegravir-emtricitabine-tenofovir AF (BIKTARVY) 50-200-25 MG TABS tablet      TAKE 1 TABLET BY MOUTH DAILY.    TAKE 1 TABLET BY MOUTH DAILY.  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   Respiratory: Negative.    Cardiovascular: Negative.   Gastrointestinal: Negative.      OBJECTIVE:    Vitals:   08/22/22 0844  BP: 106/69  Pulse: 65  Temp: 97.7 F (36.5 C)  TempSrc: Temporal  Weight: 174 lb (78.9 kg)  Height: '5\' 9"'$  (1.753 m)     Body mass index is 25.7 kg/m.  Physical Exam Constitutional:      Appearance: Normal appearance.  HENT:     Head: Normocephalic and atraumatic.  Abdominal:     General: There is no distension.     Palpations: Abdomen is soft.  Musculoskeletal:         General: Normal range of motion.     Cervical back: Normal range of motion and neck supple.  Skin:    General: Skin is warm and dry.  Neurological:     General: No focal deficit present.     Mental Status: He is alert and oriented to person, place, and time.     Labs and Microbiology:    Latest Ref Rng & Units 06/25/2022    8:46 AM 02/22/2022   10:41 AM 02/08/2022    8:55 AM  CMP  Glucose 70 - 99 mg/dL 103  125  91   BUN 8 - 23 mg/dL '24  25  22   '$ Creatinine 0.61 - 1.24 mg/dL 1.12  1.15  1.28   Sodium 135 - 145 mmol/L 138  139  141   Potassium 3.5 - 5.1 mmol/L 4.3  4.4  4.2   Chloride 98 - 111 mmol/L 108  108  107   CO2 22 - 32 mmol/L '27  26  26   '$ Calcium 8.9 - 10.3 mg/dL 8.7  8.8  8.6   Total Protein 6.5 - 8.1 g/dL 6.8  6.2  5.8   Total Bilirubin 0.3 - 1.2 mg/dL 0.5  0.9  0.5   Alkaline Phos 38 - 126 U/L 76  62    AST 15 - 41 U/L '17  22  14   '$ ALT 0 - 44 U/L '11  15  11       '$ Latest Ref Rng & Units 06/25/2022    8:46 AM 02/22/2022   10:41 AM 02/08/2022    8:55 AM  CBC  WBC 4.0 - 10.5 K/uL 5.1  5.4  4.8   Hemoglobin 13.0 - 17.0 g/dL 14.6  13.5  13.6   Hematocrit 39.0 - 52.0 % 42.6  39.0  40.6   Platelets 150 - 400 K/uL 218  178  192      Lab Results  Component Value Date   HIV1RNAQUANT Not Detected 02/08/2022   HIV1RNAQUANT 28 (H) 07/18/2021   HIV1RNAQUANT 43 (H) 04/24/2021   CD4TABS 382 (L) 02/08/2022   CD4TABS 392 (L) 07/18/2021   CD4TABS 329 (L) 04/24/2021    RPR and STI: Lab Results  Component Value Date   LABRPR NON-REACTIVE 02/08/2022   LABRPR NON-REACTIVE 07/18/2021   LABRPR NON-REACTIVE 05/05/2020    STI Results GC CT  02/08/2022  9:05 AM Negative  Negative   07/18/2021 11:16 AM Negative  Negative   05/05/2020  9:53 AM Negative  Negative     Hepatitis B: Lab Results  Component Value Date   HEPBSAB NON-REACTIVE 05/05/2020   HEPBSAG NON-REACTIVE 05/05/2020   HEPBCAB NON-REACTIVE 05/05/2020   Hepatitis C: Lab Results  Component Value Date    HEPCAB NON-REACTIVE 05/05/2020   Hepatitis A: Lab Results  Component Value Date   HAV REACTIVE (A) 05/05/2020   Lipids: Lab Results  Component Value Date   CHOL 151 02/08/2022   TRIG 78 02/08/2022   HDL 35 (L) 02/08/2022   CHOLHDL 4.3 02/08/2022   [place] 99 02/08/2022    Imaging:    ASSESSMENT & PLAN:    HIV disease ([place]) Doing well on Biktarvy and no issues noted.  Will obtain repeat labs today but was undetectable with CD4 count of 382 in July 2023.  Refills sent today as well and follow up in 12 months.  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  He was seen by [facility], Dr Payton Emerald, on 03/23/22 whom recommended repeat biopsy which was done on 04/03/22.  Stain was positive again but culture without growth.  Seen by Dr Olena Leatherwood again on 05/04/22 and reported drainage of pus from node after the 2nd biopsy.  He was referred to surgery to consider excision with plan to follow up with Dr Olena Leatherwood in 6 weeks.  Per patient, he was told there was not enough information from the biopsy and testing to determine what course of treatment would be indicated at that time.  Patient reports today that the drainage has ceased and this small nodule is no longer bothersome and he does not notice it.  For now, he cancelled his follow up at [facility] due to this resolution.  He states he will re-engage with [facility] if this becomes a concern for him in the future.   Orders Placed This Encounter  Procedures   HIV-1 RNA quant-no reflex-bld   T-helper cell (CD4)- ([facility] clinic only)         [name] for Infectious [facility] 08/22/2022, 9:07 AM

## 2022-08-22 NOTE — Assessment & Plan Note (Signed)
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  He was seen by [facility], Dr Payton Emerald, on 03/23/22 whom recommended repeat biopsy which was done on 04/03/22.  Stain was positive again but culture without growth.  Seen by Dr Olena Leatherwood again on 05/04/22 and reported drainage of pus from node after the 2nd biopsy.  He was referred to surgery to consider excision with plan to follow up with Dr Olena Leatherwood in 6 weeks.  Per patient, he was told there was not enough information from the biopsy and testing to determine what course of treatment would be indicated at that time.  Patient reports today that the drainage has ceased and this small nodule is no longer bothersome and he does not notice it.  For now, he cancelled his follow up at [facility] due to this resolution.  He states he will re-engage with [facility] if this becomes a concern for him in the future.

## 2022-08-23 LAB — T-HELPER CELL (CD4) - (RCID CLINIC ONLY)
CD4 % Helper T Cell: 18 % — ABNORMAL LOW (ref 33–65)
CD4 T Cell Abs: 333 /uL — ABNORMAL LOW (ref 400–1790)

## 2022-08-25 LAB — HIV-1 RNA QUANT-NO REFLEX-BLD
HIV 1 RNA Quant: <20 Copies/mL — ABNORMAL HIGH
HIV-1 RNA Quant, Log: <1.3 Log cps/mL — ABNORMAL HIGH

## 2022-08-30 ENCOUNTER — Other Ambulatory Visit (HOSPITAL_COMMUNITY): Payer: Self-pay

## 2022-08-31 ENCOUNTER — Other Ambulatory Visit (HOSPITAL_COMMUNITY): Payer: Self-pay

## 2022-09-03 ENCOUNTER — Other Ambulatory Visit (HOSPITAL_COMMUNITY): Payer: Self-pay

## 2022-09-04 ENCOUNTER — Other Ambulatory Visit: Payer: Self-pay

## 2022-09-04 ENCOUNTER — Telehealth: Payer: Self-pay

## 2022-09-04 ENCOUNTER — Other Ambulatory Visit (HOSPITAL_COMMUNITY): Payer: Self-pay

## 2022-09-04 NOTE — Telephone Encounter (Signed)
[facility] Patient Advocate Encounter   I was successful in securing patient a $ 10,000.00 grant from [employer] to provide copayment coverage for Biktarvy.  The patient's out of pocket cost will be $10.00 monthly.     I have spoken with the patient.    The billing information is as follows and has been shared with [facility].          Dates of Eligibility: 09/04/22 through 07/30/23  Patient knows to call the office with questions or concerns.  Ileene Patrick, [facility] Patient [facility] Phone: [phone] Fax:  [phone]

## 2022-09-24 ENCOUNTER — Other Ambulatory Visit (HOSPITAL_COMMUNITY): Payer: Self-pay

## 2022-09-25 ENCOUNTER — Ambulatory Visit
Admission: RE | Admit: 2022-09-25 | Discharge: 2022-09-25 | Disposition: A | Discharge status: Home / Self Care | Payer: Medicare HMO | Source: Ambulatory Visit | Attending: Oncology | Admitting: Oncology

## 2022-09-25 DIAGNOSIS — C8510 Unspecified B-cell lymphoma, unspecified site: Secondary | ICD-10-CM | POA: Diagnosis not present

## 2022-09-25 DIAGNOSIS — R590 Localized enlarged lymph nodes: Secondary | ICD-10-CM

## 2022-09-25 MED ORDER — IOHEXOL 300 MG/ML  SOLN
75.0000 mL | Freq: Once | INTRAMUSCULAR | Status: AC | PRN
  Administered 2022-09-25: 75 mL via INTRAVENOUS

## 2022-10-03 ENCOUNTER — Other Ambulatory Visit (HOSPITAL_COMMUNITY): Payer: Self-pay

## 2022-10-04 ENCOUNTER — Telehealth: Payer: Self-pay

## 2022-10-04 NOTE — Telephone Encounter (Signed)
-----   Message from Earlie Server, MD sent at 10/04/2022  9:03 AM EST ----- CT scan showed stable results.  I recommend PET restaging at end of May, at least 3 days prior to his MD visit with me. Thank you.  My chart message about the plan sent to him.

## 2022-10-04 NOTE — Telephone Encounter (Signed)
PET scan and follow already scheduled.

## 2022-10-30 ENCOUNTER — Other Ambulatory Visit (HOSPITAL_COMMUNITY): Payer: Self-pay

## 2022-11-05 ENCOUNTER — Other Ambulatory Visit: Payer: Self-pay

## 2022-11-05 ENCOUNTER — Inpatient Hospital Stay: Payer: Medicare HMO | Attending: Oncology

## 2022-11-05 DIAGNOSIS — Z452 Encounter for adjustment and management of vascular access device: Secondary | ICD-10-CM | POA: Diagnosis not present

## 2022-11-05 DIAGNOSIS — L578 Other skin changes due to chronic exposure to nonionizing radiation: Secondary | ICD-10-CM | POA: Diagnosis not present

## 2022-11-05 DIAGNOSIS — Z859 Personal history of malignant neoplasm, unspecified: Secondary | ICD-10-CM | POA: Diagnosis not present

## 2022-11-05 DIAGNOSIS — Z872 Personal history of diseases of the skin and subcutaneous tissue: Secondary | ICD-10-CM | POA: Diagnosis not present

## 2022-11-05 DIAGNOSIS — D485 Neoplasm of uncertain behavior of skin: Secondary | ICD-10-CM | POA: Diagnosis not present

## 2022-11-05 DIAGNOSIS — D0439 Carcinoma in situ of skin of other parts of face: Secondary | ICD-10-CM | POA: Diagnosis not present

## 2022-11-05 DIAGNOSIS — C8510 Unspecified B-cell lymphoma, unspecified site: Secondary | ICD-10-CM | POA: Insufficient documentation

## 2022-11-05 DIAGNOSIS — D044 Carcinoma in situ of skin of scalp and neck: Secondary | ICD-10-CM | POA: Diagnosis not present

## 2022-11-05 DIAGNOSIS — L570 Actinic keratosis: Secondary | ICD-10-CM | POA: Diagnosis not present

## 2022-11-05 DIAGNOSIS — Z95828 Presence of other vascular implants and grafts: Secondary | ICD-10-CM

## 2022-11-05 MED ORDER — SODIUM CHLORIDE 0.9% FLUSH
10.0000 mL | Freq: Once | INTRAVENOUS | Status: AC
  Administered 2022-11-05: 10 mL via INTRAVENOUS
  Filled 2022-11-05: qty 10

## 2022-11-05 MED ORDER — HEPARIN SOD (PORK) LOCK FLUSH 100 UNIT/ML IV SOLN
500.0000 [IU] | Freq: Once | INTRAVENOUS | Status: AC
  Administered 2022-11-05: 500 [IU] via INTRAVENOUS
  Filled 2022-11-05: qty 5

## 2022-11-19 DIAGNOSIS — C4442 Squamous cell carcinoma of skin of scalp and neck: Secondary | ICD-10-CM | POA: Diagnosis not present

## 2022-11-19 DIAGNOSIS — D044 Carcinoma in situ of skin of scalp and neck: Secondary | ICD-10-CM | POA: Diagnosis not present

## 2022-11-27 ENCOUNTER — Other Ambulatory Visit: Payer: Self-pay

## 2022-11-27 ENCOUNTER — Other Ambulatory Visit (HOSPITAL_COMMUNITY): Payer: Self-pay

## 2022-12-03 ENCOUNTER — Other Ambulatory Visit: Payer: Self-pay

## 2022-12-03 DIAGNOSIS — D0439 Carcinoma in situ of skin of other parts of face: Secondary | ICD-10-CM | POA: Diagnosis not present

## 2022-12-03 DIAGNOSIS — C44329 Squamous cell carcinoma of skin of other parts of face: Secondary | ICD-10-CM | POA: Diagnosis not present

## 2022-12-18 DIAGNOSIS — L570 Actinic keratosis: Secondary | ICD-10-CM | POA: Diagnosis not present

## 2022-12-25 ENCOUNTER — Other Ambulatory Visit (HOSPITAL_COMMUNITY): Payer: Self-pay

## 2022-12-26 ENCOUNTER — Other Ambulatory Visit: Payer: Medicare HMO

## 2022-12-28 ENCOUNTER — Ambulatory Visit: Payer: Medicare HMO | Admitting: Oncology

## 2022-12-28 ENCOUNTER — Other Ambulatory Visit (HOSPITAL_COMMUNITY): Payer: Self-pay

## 2022-12-31 ENCOUNTER — Other Ambulatory Visit (HOSPITAL_COMMUNITY): Payer: Self-pay

## 2023-01-02 ENCOUNTER — Other Ambulatory Visit (HOSPITAL_COMMUNITY): Payer: Self-pay

## 2023-01-07 ENCOUNTER — Other Ambulatory Visit (HOSPITAL_COMMUNITY): Payer: Self-pay

## 2023-01-08 ENCOUNTER — Other Ambulatory Visit: Payer: Medicare HMO

## 2023-01-08 ENCOUNTER — Inpatient Hospital Stay: Payer: Medicare HMO | Attending: Oncology

## 2023-01-08 ENCOUNTER — Ambulatory Visit
Admission: RE | Admit: 2023-01-08 | Discharge: 2023-01-08 | Disposition: A | Discharge status: Home / Self Care | Payer: Medicare HMO | Source: Ambulatory Visit | Attending: Oncology | Admitting: Oncology

## 2023-01-08 DIAGNOSIS — B20 Human immunodeficiency virus [HIV] disease: Secondary | ICD-10-CM | POA: Diagnosis not present

## 2023-01-08 DIAGNOSIS — R590 Localized enlarged lymph nodes: Secondary | ICD-10-CM

## 2023-01-08 DIAGNOSIS — C8510 Unspecified B-cell lymphoma, unspecified site: Secondary | ICD-10-CM | POA: Insufficient documentation

## 2023-01-08 DIAGNOSIS — I700 Atherosclerosis of aorta: Secondary | ICD-10-CM | POA: Insufficient documentation

## 2023-01-08 DIAGNOSIS — Z95828 Presence of other vascular implants and grafts: Secondary | ICD-10-CM

## 2023-01-08 DIAGNOSIS — Z452 Encounter for adjustment and management of vascular access device: Secondary | ICD-10-CM | POA: Insufficient documentation

## 2023-01-08 DIAGNOSIS — N400 Enlarged prostate without lower urinary tract symptoms: Secondary | ICD-10-CM | POA: Insufficient documentation

## 2023-01-08 DIAGNOSIS — C8590 Non-Hodgkin lymphoma, unspecified, unspecified site: Secondary | ICD-10-CM | POA: Diagnosis not present

## 2023-01-08 LAB — CBC WITH DIFFERENTIAL/PLATELET
Abs Immature Granulocytes: 0.02 10*3/uL (ref 0.00–0.07)
Basophils Absolute: 0 10*3/uL (ref 0.0–0.1)
Basophils Relative: 1 %
Eosinophils Absolute: 0.1 10*3/uL (ref 0.0–0.5)
Eosinophils Relative: 3 %
HCT: 39.4 % (ref 39.0–52.0)
Hemoglobin: 13.5 g/dL (ref 13.0–17.0)
Immature Granulocytes: 0 %
Lymphocytes Relative: 41 %
Lymphs Abs: 2.1 10*3/uL (ref 0.7–4.0)
MCH: 31.9 pg (ref 26.0–34.0)
MCHC: 34.3 g/dL (ref 30.0–36.0)
MCV: 93.1 fL (ref 80.0–100.0)
Monocytes Absolute: 0.5 10*3/uL (ref 0.1–1.0)
Monocytes Relative: 10 %
Neutro Abs: 2.4 10*3/uL (ref 1.7–7.7)
Neutrophils Relative %: 45 %
Platelets: 177 10*3/uL (ref 150–400)
RBC: 4.23 MIL/uL (ref 4.22–5.81)
RDW: 12.8 % (ref 11.5–15.5)
WBC: 5.2 10*3/uL (ref 4.0–10.5)
nRBC: 0 % (ref 0.0–0.2)

## 2023-01-08 LAB — GLUCOSE, CAPILLARY: Glucose-Capillary: 80 mg/dL (ref 70–99)

## 2023-01-08 LAB — COMPREHENSIVE METABOLIC PANEL
ALT: 14 U/L (ref 0–44)
AST: 19 U/L (ref 15–41)
Albumin: 3.5 g/dL (ref 3.5–5.0)
Alkaline Phosphatase: 66 U/L (ref 38–126)
Anion gap: 6 (ref 5–15)
BUN: 17 mg/dL (ref 8–23)
CO2: 23 mmol/L (ref 22–32)
Calcium: 8.3 mg/dL — ABNORMAL LOW (ref 8.9–10.3)
Chloride: 110 mmol/L (ref 98–111)
Creatinine, Ser: 1.04 mg/dL (ref 0.61–1.24)
GFR, Estimated: >60 mL/min (ref >60)
Glucose, Bld: 95 mg/dL (ref 70–99)
Potassium: 4 mmol/L (ref 3.5–5.1)
Sodium: 139 mmol/L (ref 135–145)
Total Bilirubin: 0.9 mg/dL (ref 0.3–1.2)
Total Protein: 6.4 g/dL — ABNORMAL LOW (ref 6.5–8.1)

## 2023-01-08 LAB — LACTATE DEHYDROGENASE: LDH: 131 U/L (ref 98–192)

## 2023-01-08 MED ORDER — SODIUM CHLORIDE 0.9% FLUSH
10.0000 mL | INTRAVENOUS | Status: DC | PRN
  Administered 2023-01-08: 10 mL via INTRAVENOUS
  Filled 2023-01-08: qty 10

## 2023-01-08 MED ORDER — HEPARIN SOD (PORK) LOCK FLUSH 100 UNIT/ML IV SOLN
500.0000 [IU] | Freq: Once | INTRAVENOUS | Status: AC
  Administered 2023-01-08: 500 [IU] via INTRAVENOUS
  Filled 2023-01-08: qty 5

## 2023-01-08 MED ORDER — FLUDEOXYGLUCOSE F - 18 (FDG) INJECTION
9.0000 | Freq: Once | INTRAVENOUS | Status: AC | PRN
  Administered 2023-01-08: 9.77 via INTRAVENOUS

## 2023-01-08 NOTE — Patient Instructions (Signed)

## 2023-01-10 LAB — COMP PANEL: LEUKEMIA/LYMPHOMA

## 2023-01-11 ENCOUNTER — Inpatient Hospital Stay: Payer: Medicare HMO | Admitting: Oncology

## 2023-01-11 ENCOUNTER — Encounter: Payer: Self-pay | Admitting: Oncology

## 2023-01-11 VITALS — BP 108/63 | HR 66 | Temp 96.0°F | Resp 18 | Wt 171.9 lb

## 2023-01-11 DIAGNOSIS — R2231 Localized swelling, mass and lump, right upper limb: Secondary | ICD-10-CM | POA: Diagnosis not present

## 2023-01-11 DIAGNOSIS — B20 Human immunodeficiency virus [HIV] disease: Secondary | ICD-10-CM | POA: Diagnosis not present

## 2023-01-11 DIAGNOSIS — Z452 Encounter for adjustment and management of vascular access device: Secondary | ICD-10-CM | POA: Diagnosis not present

## 2023-01-11 DIAGNOSIS — Z95828 Presence of other vascular implants and grafts: Secondary | ICD-10-CM

## 2023-01-11 DIAGNOSIS — C8510 Unspecified B-cell lymphoma, unspecified site: Secondary | ICD-10-CM

## 2023-01-11 NOTE — Assessment & Plan Note (Signed)
AIDS patient follows with ID physician.   continue antiretroviral therapy  

## 2023-01-11 NOTE — Assessment & Plan Note (Signed)
Biopsy showed necrotizing granuloma. + AFB stating negative growth.  Not lymphoma. Follow up with ID 

## 2023-01-11 NOTE — Progress Notes (Signed)
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 IHC MYC+, BCL 6+, negative translocation. Status post 6 cycles of dose adjusted EPOCH with rituximab.  Patient had +1 dose level increase for cycle 2/cycle 3, +2 dose level increase for cycle 4, +1 dose level for cycle 5 and 6. Intrathecal MTX Patient is doing well.  No constitutional symptoms PET scan imaging was reviewed by me and discussed with patient and wife. Hypermetabolic small neck lymph nodes, persistent and some of these nodes are new. Size is still very small, or subcentimeters. His recent forehead and left cheek skin surgery/possible infection may contribute to some of the increased activities. Recommend repeat CT in 4 weeks.  Consider biopsy of cervical lymph node if any increased lymph nodes.  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   continue antiretroviral therapy   Axillary mass, right Biopsy showed necrotizing granuloma. + AFB stating negative growth.  Not lymphoma. Follow up with ID  Port-A-Cath in place Port flush every 8 weeks.   Orders Placed This Encounter  Procedures   CT SOFT TISSUE NECK W CONTRAST    Standing Status:   Future    Standing Expiration Date:   01/11/2024    Order Specific Question:   If indicated for the ordered procedure, I authorize the administration of contrast media per Radiology protocol    Answer:   Yes    Order Specific Question:   Preferred imaging location?    Answer:   [facility]   Follow up per LOS All questions were answered. The patient knows to call the clinic with any problems, questions or concerns.  Rickard Patience, MD, PhD [facility]  Health Hematology Oncology 01/11/2023   CHIEF COMPLAINTS/REASON FOR VISIT:  Follow up for HIV related high-grade B-cell lymphoma   HISTORY OF PRESENTING ILLNESS:   Justin Wallace is a  77 y.o.  male with PMH listed below was seen in consultation at the request of  Justin Wallace *  for evaluation of symptomatic anemia Patient reported history of sudden onset of diffuse body aches, decreased appetite, headache low-grade fever, profound weakness/fatigue about 2 weeks ago.  Prior to the onset of symptoms, he went to a funeral as well as applicable clinic.  He reports that he has received COVID-19 vaccination previously.  He moved from [place].  Daughter is an Charity fundraiser and told him to get COVID-19 PCR checked and he was tested negative. Patient also reports a sudden drop of weight since the onset of symptoms.  Patient has a history of celiac disease.  No colonoscopy records or pathology records in current EMR.  Diagnosis was done by [facility]ology.  Patient reports that he was in his usual state of health until the onset of symptoms.  03/10/2020, patient was evaluated by primary care provider.  Blood work showed mild anemia with hemoglobin of 13.1, Iron panel showed decreased saturation of 8, ferritin 218, TIBC 291, patient was referred to hematology for evaluation of symptomatic anemia and iron deficiency.  Patient reports that her body aches, fever and headache symptoms have improved.  However he continues to feel very tired and fatigued.  Appetite has decreased and weight remains low.  # Physical examination showed left axillary mass  ultrasound confirmed a left axillary large soft tissue mass up to 6.8 cm with internal vascularity. 03/30/2020 CT chest with contrast showed soft tissue mass/enlarged lymph node in the left axilla 6.3 cm, concerning for primary mass or metastatic lymphadenopathy.  There are additional prominent although much smaller right axillary lymph nodes, pretracheal and  superior mediastinal lymph nodes.  Findings are concerning for malignancy such as lymphoma or nodal metastatic disease.  # treated for Lymes disease, finished doxycycline 100mg  BID for 21 days course. 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 he has started treatment with Biktarvy, also started on Bactrim daily.  04/26/2020 PET scan showed large intensely hypermetabolic left axillary mass SUV 28.  Additional small bilateral axilla lymph node with mild metabolic activity SUV 2.  Metabolic activity through the porta hepatis region without enlarged nodes identified.  No bone lesions. 05/02/2020 bone marrow biopsy showed hypercellular marrow for age with trilineage hematopoiesis.  Increased number of CD10 positive B cells, cannot rule out minimal or subtle involvement of marrow by the high-grade B-cell lymphoma. Normal cytogenetics. 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  Positive for BCL6 and MYC expression.  FISH testing showed negative for BCL-2/BCL6/MYC gene rearrangement.   Trisomy 3 and 18 were identified and are nonspecific findings seen in clonal lymphoid neoplasm.  Final results were signed out on 05/30/2020-due to the delay of  FISH results.  Bsed on the FISH results, lymphoma is best classified as high-grade B-cell lymphoma not otherwise specified.  It does not meet morphological criteria for diffuse large B-cell lymphoma nor molecular criteria for Burkitt lymphoma.  11 q. alteration testing has been sent.  Results are pending. 04/26/2020, MUGA testing showed normal LVEF of 56% with normal LV wall motion.   05/13/2020, echocardiogram showed low normal end ejection fraction of 50 to 55%.  #04/19/2020 Mediport placed by Dr. Wyn Quaker # Chemotherapy # 06/06/2020 cycle 1 R-EPOCH +GCSF                    Intrathecal MTX # 06/27/2020 cycle 2 R- EPOCH +GCSF [+ 1 level]  Intrathecal MTX # 07/18/2020 cycle 3 R -EPOCH +GCSF [+ 1 level]   Intrathecal MTX # 08/03/2020 PET negative. Deuville 3 # 08/08/2020 cycle 4 R EPOCH +GCSF  [+ 2 level]  Intrathecal MTX- did not tolerate due to cytopenia and mucositis # 08/29/2020 cycle 5 R -EPOCH +GCSF [+ 1 level]  Intrathecal MTX # 09/19/2020 cycle 6 R -EPOCH +GCSF [+ 1 level]  Intrathecal MTX  5/5/ 2022 Post treatment PET  - Reduced size and activity of the prior left axillary lymph node, currently Deauville 2 - Deauville 5 activity in a low-density collection adjacent to the right proximal biceps muscle - New Deauville 4 left inguinal lymph nodes   02/20/2021 follow up PET scan- Deauville 2 -Continued decrease in size and abnormal FDG activity in the index left axillary lymph node -near complete resolution of the abnormal FDG avidity in the left inguinal lymph nodes - Decreased hypermetabolic activity associated with the low-density fluid collection along the right proximal bicep muscle, favored to represent synovitis/bursitis   #10/03/2021, upper endoscopy showed congested and nodular mucosa in the greater curvature, antrum and prepyloric region of the stomach.  Biopsied. 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  09/05/2021, PET showed  diffuse hypermetabolic activity throughout the stomach.-Endoscopy biopsy showed gastritis. Low-density peripheral hypermetabolic lesion in the right shoulder anterior to the biceps muscle-this was previously thought to be bursitis/synovitis.-I will obtain right axillary ultrasound and likely needle biopsy. New segmental nodular airspace in the right upper lobe-likely secondary to infection, patient was treated with a course of antibiotics and symptom resolved.  02/19/2022, PET scan showed small dural 4 lymph nodes in the neck-right level IIA lymph node has  slightly increased metabolic activity compared to prior examination.  Slightly decreased in size.  Small left level IIA lymph nodes substantially reducing activity compared to prior.  Previous right upper lobe hypermetabolic airspace opacity resolved.   Right axilla mass has reduced activity compared to prior examination.  Diffuse gastric activity is substantially improved from prior.  Other chronic changes.  + Right axillary hypermetabolic cystic mass. 03/09/2022, patient underwent right axillary cystic mass biopsy, pathology showed necrotizing granulomatous, AFB stain positive for mycobacteria. 04/03/2022, repeat biopsy was performed and stained positive but culture without growth. 05/04/2022, he reported to ID doctor that he had pus drained from the node after his September 2023 biopsy and this relieved some of his symptoms.  Pain is much better now.  The mass has decreased in size.  He is now currently has minimal drainage.  06/25/22 PET showed  1. Similar size of hypermetabolic small cervical nodes. The right-sided node is slightly increased in hypermetabolism today. (Deauville) 5  2. No new sites of disease identified. 3. Increased hypermetabolism within the stomach, without CT correlate, suggesting gastritis. 4. Persistent hypermetabolism about the anterior right deltoid muscle, at the site of less well-defined fluid/edema than on the prior exam, nonspecific. Possibly a ganglion cyst. 5. Incidental findings, including: Prostatomegaly. Aortic Atherosclerosis    INTERVAL HISTORY Justin Wallace is a 77 y.o. male who has above history reviewed by me today presents for follow up visit for high-grade B-cell lymphoma  He reports feeling well. Denies weight loss, fever, chills, fatigue, night sweats.  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 After his 5/6 forehead skin surgery, he developed swelling of right facial soft tissue, his dermatologist  prescribed amitotics which he finished around 12/23/2022. He is compliant with his HIV medications.  No new complaints. 01/08/2023 PET scan restaging showed Increasingly hypermetabolic cervical nodes Hypermetabolic right level II lymph node measures 8 mm (4/23), SUV max 8.0, compared to 7 mm and SUV max 5.1 previously. New hypermetabolic right submandibular lymph node measures 5 mm (4/26), SUV max 4.4. New inferior right level II lymph node measures 7 mm (4/28), SUV max 6.1. 6 mm left level II lymph node (4/24), SUV max 6.3, compared to 5 mm and SUV max 3.0 previously.  Residual borderline hypermetabolism associated with nodular thickening of right axilla. Persistent gastric uptake may be due to chronic gastritis.  Enlarged prostate.  Aortic atherosclerosis.   Review of Systems  Constitutional:  Negative for appetite change, chills, fatigue, fever and unexpected weight change.  HENT:   Negative for hearing loss, mouth sores and voice change.   Eyes:  Negative for eye problems and icterus.  Respiratory:  Negative for chest tightness, cough and shortness of breath.   Cardiovascular:  Negative for chest pain and leg swelling.  Gastrointestinal:  Negative for abdominal distention and abdominal pain.  Endocrine: Negative for hot flashes.  Genitourinary:  Negative for difficulty urinating, dysuria and frequency.   Musculoskeletal:  Negative for arthralgias.  Skin:  Negative for itching and  rash.  Neurological:  Positive for numbness. Negative for light-headedness.  Hematological:  Negative for adenopathy. Does not bruise/bleed easily.  Psychiatric/Behavioral:  Negative for confusion.     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HCC) 05/27/2020   HIV (human immunodeficiency virus infection) (HCC)    Lyme disease    Lymphoma (HCC)     SURGICAL HISTORY: Past Surgical History:  Procedure Laterality Date    ESOPHAGOGASTRODUODENOSCOPY (EGD) WITH PROPOFOL N/A 10/03/2021   Procedure: ESOPHAGOGASTRODUODENOSCOPY (EGD) WITH PROPOFOL;  Surgeon: Wyline Mood, MD;  Location: [facility] ENDOSCOPY;  Service: Gastroenterology;  Laterality: N/A;   PORTA CATH INSERTION N/A 04/25/2020   Procedure: PORTA CATH INSERTION;  Surgeon: Annice Needy, MD;  Location: [facility] INVASIVE CV LAB;  Service: Cardiovascular;  Laterality: N/A;    SOCIAL HISTORY: Social History   Socioeconomic History   Marital status: Married    Spouse name: Erskine Squibb    Number of children: 2   Years of education: Not on file   Highest education level: Not on file  Occupational History   Not on file  Tobacco Use   Smoking status: Never   Smokeless tobacco: Never  Vaping Use   Vaping Use: Never used  Substance and Sexual Activity   Alcohol use: Yes    Comment: occasional   Drug use: Never   Sexual activity: Never  Other Topics Concern   Not on file  Social History Narrative   Not on file   Social Determinants of Health   Financial Resource Strain: Not on file  Food Insecurity: Not on file  Transportation Needs: Not on file  Physical Activity: Not on file  Stress: Not on file  Social Connections: Not on file  Intimate Partner Violence: Not on file    FAMILY HISTORY: Family History  Problem Relation Age of Onset   Hyperlipidemia Mother    Brain cancer Father     ALLERGIES:  has No Known Allergies.  MEDICATIONS:  Current Outpatient Medications  Medication Sig Dispense Refill   bictegravir-emtricitabine-tenofovir AF (BIKTARVY) 50-200-25 MG TABS tablet TAKE 1 TABLET BY MOUTH DAILY. 30 tablet 11   Multiple Vitamin (MULTIVITAMIN ADULT PO) Take 1 tablet by mouth daily.     No current facility-administered medications for this visit.     PHYSICAL EXAMINATION: ECOG PERFORMANCE STATUS: 1 - Symptomatic but completely ambulatory Vitals:   01/11/23 1221  BP: 108/63  Pulse: 66  Resp: 18  Temp: (!) 96 F (35.6 C)  SpO2: 99%   Filed  Weights   01/11/23 1221  Weight: 171 lb 14.4 oz (78 kg)    Physical Exam Constitutional:      General: He is not in acute distress. HENT:     Head: Normocephalic and atraumatic.  Eyes:     General: No scleral icterus. Cardiovascular:     Rate and Rhythm: Normal rate.  Pulmonary:     Effort: Pulmonary effort is normal. No respiratory distress.  Abdominal:     General: There is no distension.  Musculoskeletal:        General: No deformity. Normal range of motion.     Cervical back: Normal range of motion and neck supple.  Skin:    Findings: No erythema or rash.  Neurological:     Mental Status: He is alert and oriented to person, place, and time. Mental status is at baseline.     Cranial Nerves: No cranial nerve deficit.  Psychiatric:        Mood and Affect: Mood normal.       LABORATORY DATA:  I have reviewed the data as listed    Latest Ref Rng & Units 01/08/2023   10:06 AM 06/25/2022    8:46 AM 02/22/2022   10:41 AM  CBC  WBC 4.0 - 10.5 K/uL 5.2  5.1  5.4   Hemoglobin 13.0 - 17.0 g/dL 40.9  81.1  91.4   Hematocrit 39.0 - 52.0 % 39.4  42.6  39.0   Platelets 150 - 400 K/uL 177  218  178        Latest Ref Rng & Units 01/08/2023   10:06 AM 06/25/2022    8:46 AM 02/22/2022   10:41 AM  CMP  Glucose 70 - 99 mg/dL 95  782  956   BUN 8 - 23 mg/dL 17  24  25    Creatinine 0.61 - 1.24 mg/dL 2.13  0.86  5.78   Sodium 135 - 145 mmol/L 139  138  139   Potassium 3.5 - 5.1 mmol/L 4.0  4.3  4.4   Chloride 98 - 111 mmol/L 110  108  108   CO2 22 - 32 mmol/L 23  27  26    Calcium 8.9 - 10.3 mg/dL 8.3  8.7  8.8   Total Protein 6.5 - 8.1 g/dL 6.4  6.8  6.2   Total Bilirubin 0.3 - 1.2 mg/dL 0.9  0.5  0.9   Alkaline Phos 38 - 126 U/L 66  76  62   AST 15 - 41 U/L 19  17  22    ALT 0 - 44 U/L 14  11  15         RADIOGRAPHIC STUDIES: I have personally reviewed the radiological images as listed and agreed with the findings in the report. NM PET Image Restag (PS) Skull Base To  Thigh  Result Date: 01/11/2023 CLINICAL DATA:  Subsequent treatment strategy for lymphoma. EXAM: NUCLEAR MEDICINE PET SKULL BASE TO THIGH TECHNIQUE: 9.8 mCi F-18 FDG was injected intravenously. Full-ring PET imaging was performed from the skull base to thigh after the radiotracer. CT data was obtained and used for attenuation correction and anatomic localization. Fasting blood glucose: 80 mg/dl COMPARISON:  CT neck 46/96/2952 and PET 06/25/2022. FINDINGS: Mediastinal blood pool activity: SUV max 2.5 Liver activity: SUV max 3.1 NECK: Hypermetabolic right level II lymph node measures 8 mm (4/23), SUV max 8.0, compared to 7 mm and SUV max 5.1 previously. New hypermetabolic right submandibular lymph node measures 5 mm (4/26), SUV max 4.4. New inferior right level II lymph node measures 7 mm (4/28), SUV max 6.1. 6 mm left level II lymph node (4/24), SUV max 6.3, compared to 5 mm and SUV max 3.0 previously. Incidental CT findings: None. CHEST: Residual borderline hypermetabolism associated with nodular thickening in the right axilla, overlying the right deltoid muscle (6 mm, SUV max 3.0), unchanged and possibly postprocedural in etiology. Otherwise, no abnormal hypermetabolism. Incidental CT findings: Right IJ Port-A-Cath terminates at the SVC RA junction. Atherosclerotic calcification of the aorta and coronary arteries. Heart is enlarged. No pericardial or pleural effusion. ABDOMEN/PELVIS: Persistent gastric uptake, SUV max 6.4, similar. Left common iliac lymph node measures 9 mm (4/125), unchanged, and does not show metabolism above blood pool, SUV max 2.2. No additional abnormal hypermetabolism. Incidental CT findings: Probable cyst in the left hepatic lobe. Liver, gallbladder, adrenal glands and right kidney are otherwise grossly unremarkable. Low-attenuation lesions in the left kidney.  No specific follow-up necessary. Spleen, pancreas, stomach and bowel are otherwise grossly unremarkable. Enlarged nodular prostate  indents the bladder. Bladder wall thickening. SKELETON: No abnormal hypermetabolism. Incidental CT findings: Degenerative changes in the spine. IMPRESSION: 1. New and increasingly hypermetabolic cervical lymph nodes, indicative of disease progression (Deauville 5). 2. Persistent gastric uptake may be due to chronic gastritis. 3. Enlarged prostate. Associated bladder wall thickening is indicative of an element of outlet obstruction. 4.  Aortic atherosclerosis (ICD10-I70.0). Electronically Signed   By: Leanna Battles M.D.   On: 01/11/2023 08:56    NM PET Image Restag (PS) Skull Base To Thigh  Result Date: 01/11/2023 CLINICAL DATA:  Subsequent treatment strategy for lymphoma. EXAM: NUCLEAR MEDICINE PET SKULL BASE TO THIGH TECHNIQUE: 9.8 mCi F-18 FDG was injected intravenously. Full-ring PET imaging was performed from the skull base to thigh after the radiotracer. CT data was obtained and used for attenuation correction and anatomic localization. Fasting blood glucose: 80 mg/dl COMPARISON:  CT neck 56/21/3086 and PET 06/25/2022. FINDINGS: Mediastinal blood pool activity: SUV max 2.5 Liver activity: SUV max 3.1 NECK: Hypermetabolic right level II lymph node measures 8 mm (4/23), SUV max 8.0, compared to 7 mm and SUV max 5.1 previously. New hypermetabolic right submandibular lymph node measures 5 mm (4/26), SUV max 4.4. New inferior right level II lymph node measures 7 mm (4/28), SUV max 6.1. 6 mm left level II lymph node (4/24), SUV max 6.3, compared to 5 mm and SUV max 3.0 previously. Incidental CT findings: None. CHEST: Residual borderline hypermetabolism associated with nodular thickening in the right axilla, overlying the right deltoid muscle (6 mm, SUV max 3.0), unchanged and possibly postprocedural in etiology. Otherwise, no abnormal hypermetabolism. Incidental CT findings: Right IJ Port-A-Cath terminates at the SVC RA junction. Atherosclerotic calcification of the aorta and coronary arteries. Heart is  enlarged. No pericardial or pleural effusion. ABDOMEN/PELVIS: Persistent gastric uptake, SUV max 6.4, similar. Left common iliac lymph node measures 9 mm (4/125), unchanged, and does not show metabolism above blood pool, SUV max 2.2. No additional abnormal hypermetabolism. Incidental CT findings: Probable cyst in the left hepatic lobe. Liver, gallbladder, adrenal glands and right kidney are otherwise grossly unremarkable. Low-attenuation lesions in the left kidney. No specific follow-up necessary. Spleen, pancreas, stomach and bowel are otherwise grossly unremarkable. Enlarged nodular prostate indents the bladder. Bladder wall thickening. SKELETON: No abnormal hypermetabolism. Incidental CT findings: Degenerative changes in the spine. IMPRESSION: 1. New and increasingly hypermetabolic cervical lymph nodes, indicative of disease progression (Deauville 5). 2. Persistent gastric uptake may be due to chronic gastritis. 3. Enlarged prostate. Associated bladder wall thickening is indicative of an element of outlet obstruction. 4.  Aortic atherosclerosis (ICD10-I70.0). Electronically Signed   By: Leanna Battles M.D.   On: 01/11/2023 08:56

## 2023-01-11 NOTE — Assessment & Plan Note (Signed)
Port flush every 8 weeks.  

## 2023-01-11 NOTE — Assessment & Plan Note (Addendum)
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 Status post 6 cycles of dose adjusted EPOCH with rituximab.  Patient had +1 dose level increase for cycle 2/cycle 3, +2 dose level increase for cycle 4, +1 dose level for cycle 5 and 6. Intrathecal MTX Patient is doing well.  No constitutional symptoms PET scan imaging was reviewed by me and discussed with patient and wife. Hypermetabolic small neck lymph nodes, persistent and some of these nodes are new. Size is still very small, or subcentimeters. His recent forehead and left cheek skin surgery/possible infection may contribute to some of the increased activities. Recommend repeat CT in 4 weeks.  Consider biopsy of cervical lymph node if any increased lymph nodes.

## 2023-02-05 ENCOUNTER — Other Ambulatory Visit (HOSPITAL_COMMUNITY): Payer: Self-pay

## 2023-02-06 ENCOUNTER — Other Ambulatory Visit (HOSPITAL_COMMUNITY): Payer: Self-pay

## 2023-02-07 ENCOUNTER — Other Ambulatory Visit (HOSPITAL_COMMUNITY): Payer: Self-pay

## 2023-02-07 ENCOUNTER — Other Ambulatory Visit: Payer: Self-pay

## 2023-02-08 ENCOUNTER — Ambulatory Visit
Admission: RE | Admit: 2023-02-08 | Discharge: 2023-02-08 | Disposition: A | Discharge status: Home / Self Care | Payer: Medicare HMO | Source: Ambulatory Visit | Attending: Oncology | Admitting: Oncology

## 2023-02-08 DIAGNOSIS — C8510 Unspecified B-cell lymphoma, unspecified site: Secondary | ICD-10-CM | POA: Diagnosis not present

## 2023-02-08 DIAGNOSIS — R599 Enlarged lymph nodes, unspecified: Secondary | ICD-10-CM | POA: Diagnosis not present

## 2023-02-08 DIAGNOSIS — I6521 Occlusion and stenosis of right carotid artery: Secondary | ICD-10-CM | POA: Diagnosis not present

## 2023-02-08 DIAGNOSIS — J320 Chronic maxillary sinusitis: Secondary | ICD-10-CM | POA: Diagnosis not present

## 2023-02-08 DIAGNOSIS — C8590 Non-Hodgkin lymphoma, unspecified, unspecified site: Secondary | ICD-10-CM | POA: Diagnosis not present

## 2023-02-08 MED ORDER — IOHEXOL 300 MG/ML  SOLN
75.0000 mL | Freq: Once | INTRAMUSCULAR | Status: AC | PRN
  Administered 2023-02-08: 75 mL via INTRAVENOUS

## 2023-02-15 ENCOUNTER — Inpatient Hospital Stay: Payer: Medicare HMO | Admitting: Oncology

## 2023-02-18 ENCOUNTER — Encounter: Payer: Self-pay | Admitting: Oncology

## 2023-02-18 ENCOUNTER — Inpatient Hospital Stay: Payer: Medicare HMO | Attending: Oncology | Admitting: Oncology

## 2023-02-18 ENCOUNTER — Telehealth: Payer: Self-pay

## 2023-02-18 VITALS — BP 99/62 | HR 59 | Temp 96.1°F | Resp 18 | Wt 172.3 lb

## 2023-02-18 DIAGNOSIS — B20 Human immunodeficiency virus [HIV] disease: Secondary | ICD-10-CM | POA: Insufficient documentation

## 2023-02-18 DIAGNOSIS — C8510 Unspecified B-cell lymphoma, unspecified site: Secondary | ICD-10-CM | POA: Diagnosis not present

## 2023-02-18 DIAGNOSIS — R590 Localized enlarged lymph nodes: Secondary | ICD-10-CM

## 2023-02-18 DIAGNOSIS — Z95828 Presence of other vascular implants and grafts: Secondary | ICD-10-CM

## 2023-02-18 DIAGNOSIS — R222 Localized swelling, mass and lump, trunk: Secondary | ICD-10-CM | POA: Diagnosis not present

## 2023-02-18 NOTE — Telephone Encounter (Signed)
Request for CT biopsy faxed to IR.

## 2023-02-18 NOTE — Assessment & Plan Note (Signed)
AIDS patient follows with ID physician.   continue antiretroviral therapy  

## 2023-02-18 NOTE — Progress Notes (Signed)
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 IHC MYC+, BCL 6+, negative translocation. Status post 6 cycles of dose adjusted EPOCH with rituximab.  Patient had +1 dose level increase for cycle 2/cycle 3, +2 dose level increase for cycle 4, +1 dose level for cycle 5 and 6. Intrathecal MTX Patient is doing well.  No constitutional symptoms PET scan imaging was reviewed by me and discussed with patient and wife. Hypermetabolic small neck lymph nodes, persistent and some of these nodes are new. Growing size on recent PET  Recommend CT guided cervical lymphadenopathy biopsy.    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   continue antiretroviral therapy   Port-A-Cath in place Port flush every 8 weeks.   Orders Placed This Encounter  Procedures   CT BIOPSY    Standing Status:   Future    Standing Expiration Date:   02/18/2024    Order Specific Question:   Reason for exam:    Answer:   cervical lymphadenopathy    Order Specific Question:   Preferred imaging location?    Answer:   [facility]   Follow up per LOS All questions were answered. The patient knows to call the clinic with any problems, questions or concerns.  Rickard Patience, MD, PhD [facility] Hematology Oncology 02/18/2023   CHIEF COMPLAINTS/REASON FOR VISIT:  Follow up for HIV related high-grade B-cell lymphoma   HISTORY OF PRESENTING ILLNESS:   Justin Wallace is a  77 y.o.  male with PMH listed below was seen in consultation at the request of  Saralyn Pilar *  for evaluation of symptomatic anemia Patient reported history of sudden onset of diffuse body aches, decreased  appetite, headache low-grade fever, profound weakness/fatigue about 2 weeks ago.  Prior to the onset of symptoms, he went to a funeral as well as applicable clinic.  He reports that he has received COVID-19 vaccination previously.  He moved from [place].  Daughter is an Charity fundraiser and told him to get COVID-19 PCR checked and he was tested negative. Patient also reports a sudden drop of weight since the onset of symptoms.  Patient has a history of celiac disease.  No colonoscopy records or pathology records in current EMR.  Diagnosis was done by [facility].  Patient reports that he was in his usual state of health until the onset of symptoms.  03/10/2020, patient was evaluated by primary care provider.  Blood work showed mild anemia with hemoglobin of 13.1, Iron panel showed decreased saturation of 8, ferritin 218, TIBC 291, patient was referred to hematology for evaluation of symptomatic anemia and iron deficiency.  Patient reports that her body aches, fever and headache symptoms have improved.  However he continues to feel very tired and fatigued.  Appetite has decreased and weight remains low.  # Physical examination showed left axillary mass  ultrasound confirmed a left axillary large soft tissue mass up to 6.8 cm with internal vascularity. 03/30/2020 CT chest with contrast showed soft tissue mass/enlarged lymph node in the left axilla 6.3 cm, concerning for primary mass or metastatic lymphadenopathy.  There are additional prominent although much smaller right axillary lymph nodes, pretracheal and superior mediastinal lymph nodes.  Findings are concerning for  malignancy such as lymphoma or nodal metastatic disease.  # treated for Lymes disease, finished doxycycline 100mg  BID for 21 days course. 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 he has started treatment with Biktarvy, also started on Bactrim daily.  04/26/2020 PET scan showed large intensely hypermetabolic left axillary mass  SUV 28.  Additional small bilateral axilla lymph node with mild metabolic activity SUV 2.  Metabolic activity through the porta hepatis region without enlarged nodes identified.  No bone lesions. 05/02/2020 bone marrow biopsy showed hypercellular marrow for age with trilineage hematopoiesis.  Increased number of CD10 positive B cells, cannot rule out minimal or subtle involvement of marrow by the high-grade B-cell lymphoma. Normal cytogenetics. 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  Positive for BCL6 and MYC expression.  FISH testing showed negative for BCL-2/BCL6/MYC gene rearrangement.   Trisomy 3 and 18 were identified and are nonspecific findings seen in clonal lymphoid neoplasm.  Final results were signed out on 05/30/2020-due to the delay of  FISH results.  Bsed on the FISH results, lymphoma is best classified as high-grade B-cell lymphoma not otherwise specified.  It does not meet morphological criteria for diffuse large B-cell lymphoma nor molecular criteria for Burkitt lymphoma.  11 q. alteration testing has been sent.  Results are pending. 04/26/2020, MUGA testing showed normal LVEF of 56% with normal LV wall motion.   05/13/2020, echocardiogram showed low normal end ejection fraction of 50 to 55%.  #04/19/2020 Mediport placed by Dr. Wyn Quaker # Chemotherapy # 06/06/2020 cycle 1 R-EPOCH +GCSF                    Intrathecal MTX # 06/27/2020 cycle 2 R- EPOCH +GCSF [+ 1 level]  Intrathecal MTX # 07/18/2020 cycle 3 R -EPOCH +GCSF [+ 1 level]  Intrathecal MTX # 08/03/2020 PET negative. Deuville 3 # 08/08/2020 cycle 4 R EPOCH +GCSF  [+ 2 level]  Intrathecal MTX- did not tolerate due to cytopenia and mucositis # 08/29/2020 cycle 5 R -EPOCH +GCSF [+ 1 level]  Intrathecal MTX # 09/19/2020 cycle 6 R -EPOCH +GCSF [+ 1 level]  Intrathecal MTX  5/5/ 2022 Post treatment PET  - Reduced size and activity of the prior left axillary lymph node, currently  Deauville 2 - Deauville 5 activity in a low-density collection adjacent to the right proximal biceps muscle - New Deauville 4 left inguinal lymph nodes   02/20/2021 follow up PET scan- Deauville 2 -Continued decrease in size and abnormal FDG activity in the index left axillary lymph node -near complete resolution of the abnormal FDG avidity in the left inguinal lymph nodes - Decreased hypermetabolic activity associated with the low-density fluid collection along the right proximal bicep muscle, favored to represent synovitis/bursitis   #10/03/2021, upper endoscopy showed congested and nodular mucosa in the greater curvature, antrum and prepyloric region of the stomach.  Biopsied. 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  09/05/2021, PET showed diffuse hypermetabolic activity throughout the stomach.-Endoscopy biopsy showed gastritis. Low-density peripheral hypermetabolic lesion in the right shoulder anterior to the biceps muscle-this was previously thought to be bursitis/synovitis.-I will obtain right axillary ultrasound and likely needle biopsy. New segmental nodular airspace in the right upper lobe-likely secondary to infection, patient was treated with a course of antibiotics and symptom resolved.  02/19/2022, PET  scan showed small dural 4 lymph nodes in the neck-right level IIA lymph node has slightly increased metabolic activity compared to prior examination.  Slightly decreased in size.  Small left level IIA lymph nodes substantially reducing activity compared to prior.  Previous right upper lobe hypermetabolic airspace opacity resolved.   Right axilla mass has reduced activity compared to prior examination.  Diffuse gastric activity is substantially improved from prior.  Other chronic  changes.  + Right axillary hypermetabolic cystic mass. 03/09/2022, patient underwent right axillary cystic mass biopsy, pathology showed necrotizing granulomatous, AFB stain positive for mycobacteria. 04/03/2022, repeat biopsy was performed and stained positive but culture without growth. 05/04/2022, he reported to ID doctor that he had pus drained from the node after his September 2023 biopsy and this relieved some of his symptoms.  Pain is much better now.  The mass has decreased in size.  He is now currently has minimal drainage.  06/25/22 PET showed  1. Similar size of hypermetabolic small cervical nodes. The right-sided node is slightly increased in hypermetabolism today. (Deauville) 5  2. No new sites of disease identified. 3. Increased hypermetabolism within the stomach, without CT correlate, suggesting gastritis. 4. Persistent hypermetabolism about the anterior right deltoid muscle, at the site of less well-defined fluid/edema than on the prior exam, nonspecific. Possibly a ganglion cyst. 5. Incidental findings, including: Prostatomegaly. 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 After his 5/6 forehead skin surgery, he developed swelling of right facial soft tissue, his dermatologist prescribed amitotics which he finished around 12/23/2022. He is compliant with his HIV medications.  No new complaints. 01/08/2023 PET scan restaging showed Increasingly hypermetabolic cervical nodes Hypermetabolic right level II lymph node measures 8 mm (4/23), SUV max 8.0, compared to 7 mm and SUV max 5.1 previously. New hypermetabolic right submandibular lymph node measures 5 mm (4/26), SUV max 4.4. New inferior right level II lymph node measures 7 mm (4/28), SUV max 6.1. 6 mm left level II lymph node (4/24), SUV max 6.3, compared to 5 mm and SUV max 3.0 previously.  Residual borderline hypermetabolism associated with nodular thickening of right  axilla. Persistent gastric uptake may be due to chronic gastritis.  Enlarged prostate.  Aortic atherosclerosis   INTERVAL HISTORY Justin Wallace is a 77 y.o. male who has above history reviewed by me today presents for follow up visit for high-grade B-cell lymphoma  He reports feeling well. Denies weight loss, fever, chills, fatigue, night sweats.  He has no new complaints.    Review of Systems  Constitutional:  Negative for appetite change, chills, fatigue, fever and unexpected weight change.  HENT:   Negative for hearing loss, mouth sores and voice change.   Eyes:  Negative for eye problems and icterus.  Respiratory:  Negative for chest tightness, cough and shortness of breath.   Cardiovascular:  Negative for chest pain and leg swelling.  Gastrointestinal:  Negative for abdominal distention and abdominal pain.  Endocrine: Negative for hot flashes.  Genitourinary:  Negative for difficulty urinating, dysuria and frequency.   Musculoskeletal:  Negative for arthralgias.  Skin:  Negative for itching and rash.  Neurological:  Positive for numbness. Negative for light-headedness.  Hematological:  Negative for adenopathy. Does not bruise/bleed easily.  Psychiatric/Behavioral:  Negative for confusion.     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HCC) 05/27/2020   HIV (human immunodeficiency virus infection) (  HCC)    Lyme disease    Lymphoma (HCC)     SURGICAL HISTORY: Past Surgical History:  Procedure Laterality Date   ESOPHAGOGASTRODUODENOSCOPY (EGD) WITH PROPOFOL N/A 10/03/2021   Procedure: ESOPHAGOGASTRODUODENOSCOPY (EGD) WITH PROPOFOL;  Surgeon: Wyline Mood, MD;  Location: [facility] ENDOSCOPY;  Service: Gastroenterology;  Laterality: N/A;   PORTA CATH INSERTION N/A 04/25/2020   Procedure: PORTA CATH INSERTION;  Surgeon: Annice Needy, MD;  Location: [facility] INVASIVE CV LAB;  Service: Cardiovascular;  Laterality: N/A;    SOCIAL  HISTORY: Social History   Socioeconomic History   Marital status: Married    Spouse name: Erskine Squibb    Number of children: 2   Years of education: Not on file   Highest education level: Not on file  Occupational History   Not on file  Tobacco Use   Smoking status: Never   Smokeless tobacco: Never  Vaping Use   Vaping status: Never Used  Substance and Sexual Activity   Alcohol use: Yes    Comment: occasional   Drug use: Never   Sexual activity: Never  Other Topics Concern   Not on file  Social History Narrative   Not on file   Social Determinants of Health   Financial Resource Strain: Not on file  Food Insecurity: No Food Insecurity (05/04/2022)   Received from [facility], [facility]   Hunger Vital Sign    Worried About Running Out of Food in the Last Year: Never true    Ran Out of Food in the Last Year: Never true  Transportation Needs: Not on file  Physical Activity: Not on file  Stress: Not on file  Social Connections: Not on file  Intimate Partner Violence: Not on file    FAMILY HISTORY: Family History  Problem Relation Age of Onset   Hyperlipidemia Mother    Brain cancer Father     ALLERGIES:  has No Known Allergies.  MEDICATIONS:  Current Outpatient Medications  Medication Sig Dispense Refill   bictegravir-emtricitabine-tenofovir AF (BIKTARVY) 50-200-25 MG TABS tablet TAKE 1 TABLET BY MOUTH DAILY. 30 tablet 11   Multiple Vitamin (MULTIVITAMIN ADULT PO) Take 1 tablet by mouth daily.     No current facility-administered medications for this visit.     PHYSICAL EXAMINATION: ECOG PERFORMANCE STATUS: 1 - Symptomatic but completely ambulatory Vitals:   02/18/23 0946  BP: 99/62  Pulse: (!) 59  Resp: 18  Temp: (!) 96.1 F (35.6 C)  SpO2: 98%   Filed Weights   02/18/23 0946  Weight: 172 lb 4.8 oz (78.2 kg)    Physical Exam Constitutional:      General: He is not in acute distress. HENT:     Head: Normocephalic  and atraumatic.  Eyes:     General: No scleral icterus. Neck:     Comments: I am not able to appreciate the cervical lymphadenopathy Cardiovascular:     Rate and Rhythm: Normal rate.  Pulmonary:     Effort: Pulmonary effort is normal. No respiratory distress.  Abdominal:     General: There is no distension.  Musculoskeletal:        General: No deformity. Normal range of motion.     Cervical back: Normal range of motion and neck supple.  Skin:    Findings: No erythema or rash.  Neurological:     Mental Status: He is alert and oriented to person, place, and time. Mental status is at baseline.     Cranial Nerves: No cranial  nerve deficit.  Psychiatric:        Mood and Affect: Mood normal.       LABORATORY DATA:  I have reviewed the data as listed    Latest Ref Rng & Units 01/08/2023   10:06 AM 06/25/2022    8:46 AM 02/22/2022   10:41 AM  CBC  WBC 4.0 - 10.5 K/uL 5.2  5.1  5.4   Hemoglobin 13.0 - 17.0 g/dL 29.5  62.1  30.8   Hematocrit 39.0 - 52.0 % 39.4  42.6  39.0   Platelets 150 - 400 K/uL 177  218  178        Latest Ref Rng & Units 01/08/2023   10:06 AM 06/25/2022    8:46 AM 02/22/2022   10:41 AM  CMP  Glucose 70 - 99 mg/dL 95  657  846   BUN 8 - 23 mg/dL 17  24  25    Creatinine 0.61 - 1.24 mg/dL 9.62  9.52  8.41   Sodium 135 - 145 mmol/L 139  138  139   Potassium 3.5 - 5.1 mmol/L 4.0  4.3  4.4   Chloride 98 - 111 mmol/L 110  108  108   CO2 22 - 32 mmol/L 23  27  26    Calcium 8.9 - 10.3 mg/dL 8.3  8.7  8.8   Total Protein 6.5 - 8.1 g/dL 6.4  6.8  6.2   Total Bilirubin 0.3 - 1.2 mg/dL 0.9  0.5  0.9   Alkaline Phos 38 - 126 U/L 66  76  62   AST 15 - 41 U/L 19  17  22    ALT 0 - 44 U/L 14  11  15         RADIOGRAPHIC STUDIES: I have personally reviewed the radiological images as listed and agreed with the findings in the report. CT SOFT TISSUE NECK W CONTRAST  Result Date: 02/14/2023 CLINICAL DATA:  Lymphadenopathy.  Lymphoma. EXAM: CT NECK WITH CONTRAST  TECHNIQUE: Multidetector CT imaging of the neck was performed using the standard protocol following the bolus administration of intravenous contrast. RADIATION DOSE REDUCTION: This exam was performed according to the departmental dose-optimization program which includes automated exposure control, adjustment of the mA and/or kV according to patient size and/or use of iterative reconstruction technique. CONTRAST:  75mL OMNIPAQUE IOHEXOL 300 MG/ML  SOLN COMPARISON:  PET scan 01/08/2023.  CT neck with contrast 09/25/22. FINDINGS: Pharynx and larynx: No focal mucosal or submucosal lesions are present. The nasopharynx is clear. Soft palate and tongue base are within normal limits. Vallecula and epiglottis are within normal limits. Aryepiglottic folds and piriform sinuses are clear. Vocal cords are midline and symmetric. Trachea is clear. Salivary glands: The submandibular and parotid glands and ducts are within normal limits. Thyroid: Normal. Lymph nodes: A right level 2 a lymph node is slightly increased in size, now measuring 12 x 7 mm on the sagittal series. This corresponds with the FDG avid node. A left level 2A lymph node is also increased slightly in size, now measuring 12 x 6 mm on the sagittal series. No new nodes are present. Vascular: Atherosclerotic calcifications are present in the proximal right ICA without a significant stenosis relative to the more distal vessel. Limited intracranial: Within normal limits. Visualized orbits: Bilateral lens replacements are noted. Globes and orbits are otherwise unremarkable. Mastoids and visualized paranasal sinuses: Shrunken maxillary sinuses are opacified bilaterally. The paranasal sinuses and mastoid air cells are otherwise clear. Skeleton: Chronic loss of disc  height is present at C5-6, unchanged. Vertebral body heights and alignment are otherwise normal. Straightening of the normal cervical lordosis is within normal limits. Upper chest: The lung apices are clear. Mild  dependent atelectasis is present both lungs. IMPRESSION: 1. Slight increase in size of bilateral level 2A lymph nodes, right greater than left. 2. No new nodes. 3. Chronic loss of disc height at C5-6 is unchanged. 4. Chronic bilateral maxillary sinus disease. 5. Atherosclerosis in the proximal right ICA without a significant stenosis relative to the more distal vessel. Electronically Signed   By: Marin Roberts M.D.   On: 02/14/2023 16:46    CT SOFT TISSUE NECK W CONTRAST  Result Date: 02/14/2023 CLINICAL DATA:  Lymphadenopathy.  Lymphoma. EXAM: CT NECK WITH CONTRAST TECHNIQUE: Multidetector CT imaging of the neck was performed using the standard protocol following the bolus administration of intravenous contrast. RADIATION DOSE REDUCTION: This exam was performed according to the departmental dose-optimization program which includes automated exposure control, adjustment of the mA and/or kV according to patient size and/or use of iterative reconstruction technique. CONTRAST:  75mL OMNIPAQUE IOHEXOL 300 MG/ML  SOLN COMPARISON:  PET scan 01/08/2023.  CT neck with contrast 09/25/22. FINDINGS: Pharynx and larynx: No focal mucosal or submucosal lesions are present. The nasopharynx is clear. Soft palate and tongue base are within normal limits. Vallecula and epiglottis are within normal limits. Aryepiglottic folds and piriform sinuses are clear. Vocal cords are midline and symmetric. Trachea is clear. Salivary glands: The submandibular and parotid glands and ducts are within normal limits. Thyroid: Normal. Lymph nodes: A right level 2 a lymph node is slightly increased in size, now measuring 12 x 7 mm on the sagittal series. This corresponds with the FDG avid node. A left level 2A lymph node is also increased slightly in size, now measuring 12 x 6 mm on the sagittal series. No new nodes are present. Vascular: Atherosclerotic calcifications are present in the proximal right ICA without a significant stenosis  relative to the more distal vessel. Limited intracranial: Within normal limits. Visualized orbits: Bilateral lens replacements are noted. Globes and orbits are otherwise unremarkable. Mastoids and visualized paranasal sinuses: Shrunken maxillary sinuses are opacified bilaterally. The paranasal sinuses and mastoid air cells are otherwise clear. Skeleton: Chronic loss of disc height is present at C5-6, unchanged. Vertebral body heights and alignment are otherwise normal. Straightening of the normal cervical lordosis is within normal limits. Upper chest: The lung apices are clear. Mild dependent atelectasis is present both lungs. IMPRESSION: 1. Slight increase in size of bilateral level 2A lymph nodes, right greater than left. 2. No new nodes. 3. Chronic loss of disc height at C5-6 is unchanged. 4. Chronic bilateral maxillary sinus disease. 5. Atherosclerosis in the proximal right ICA without a significant stenosis relative to the more distal vessel. Electronically Signed   By: Marin Roberts M.D.   On: 02/14/2023 16:46   NM PET Image Restag (PS) Skull Base To Thigh  Result Date: 01/11/2023 CLINICAL DATA:  Subsequent treatment strategy for lymphoma. EXAM: NUCLEAR MEDICINE PET SKULL BASE TO THIGH TECHNIQUE: 9.8 mCi F-18 FDG was injected intravenously. Full-ring PET imaging was performed from the skull base to thigh after the radiotracer. CT data was obtained and used for attenuation correction and anatomic localization. Fasting blood glucose: 80 mg/dl COMPARISON:  CT neck 16/04/9603 and PET 06/25/2022. FINDINGS: Mediastinal blood pool activity: SUV max 2.5 Liver activity: SUV max 3.1 NECK: Hypermetabolic right level II lymph node measures 8 mm (4/23), SUV max 8.0,  compared to 7 mm and SUV max 5.1 previously. New hypermetabolic right submandibular lymph node measures 5 mm (4/26), SUV max 4.4. New inferior right level II lymph node measures 7 mm (4/28), SUV max 6.1. 6 mm left level II lymph node (4/24), SUV max  6.3, compared to 5 mm and SUV max 3.0 previously. Incidental CT findings: None. CHEST: Residual borderline hypermetabolism associated with nodular thickening in the right axilla, overlying the right deltoid muscle (6 mm, SUV max 3.0), unchanged and possibly postprocedural in etiology. Otherwise, no abnormal hypermetabolism. Incidental CT findings: Right IJ Port-A-Cath terminates at the SVC RA junction. Atherosclerotic calcification of the aorta and coronary arteries. Heart is enlarged. No pericardial or pleural effusion. ABDOMEN/PELVIS: Persistent gastric uptake, SUV max 6.4, similar. Left common iliac lymph node measures 9 mm (4/125), unchanged, and does not show metabolism above blood pool, SUV max 2.2. No additional abnormal hypermetabolism. Incidental CT findings: Probable cyst in the left hepatic lobe. Liver, gallbladder, adrenal glands and right kidney are otherwise grossly unremarkable. Low-attenuation lesions in the left kidney. No specific follow-up necessary. Spleen, pancreas, stomach and bowel are otherwise grossly unremarkable. Enlarged nodular prostate indents the bladder. Bladder wall thickening. SKELETON: No abnormal hypermetabolism. Incidental CT findings: Degenerative changes in the spine. IMPRESSION: 1. New and increasingly hypermetabolic cervical lymph nodes, indicative of disease progression (Deauville 5). 2. Persistent gastric uptake may be due to chronic gastritis. 3. Enlarged prostate. Associated bladder wall thickening is indicative of an element of outlet obstruction. 4.  Aortic atherosclerosis (ICD10-I70.0). Electronically Signed   By: Leanna Battles M.D.   On: 01/11/2023 08:56

## 2023-02-18 NOTE — Assessment & Plan Note (Signed)
Port flush every 8 weeks.  

## 2023-02-18 NOTE — Assessment & Plan Note (Addendum)
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 Status post 6 cycles of dose adjusted EPOCH with rituximab.  Patient had +1 dose level increase for cycle 2/cycle 3, +2 dose level increase for cycle 4, +1 dose level for cycle 5 and 6. Intrathecal MTX Patient is doing well.  No constitutional symptoms PET scan imaging was reviewed by me and discussed with patient and wife. Hypermetabolic small neck lymph nodes, persistent and some of these nodes are new. Growing size on recent PET  Recommend CT guided cervical lymphadenopathy biopsy.

## 2023-02-19 NOTE — Telephone Encounter (Deleted)
Pt scheduled for Tues 7/30 at 8:30a an arrive at 7:30. Pt aware of appt.

## 2023-02-21 ENCOUNTER — Telehealth: Payer: Self-pay | Admitting: Oncology

## 2023-02-21 ENCOUNTER — Other Ambulatory Visit: Payer: Self-pay | Admitting: Oncology

## 2023-02-21 DIAGNOSIS — R590 Localized enlarged lymph nodes: Secondary | ICD-10-CM

## 2023-02-21 NOTE — Progress Notes (Signed)
[name], MD sent to Justin Wallace Approved for US guided right neck LN core vs FNA. Sedation per pt preference.       Previous Messages    ----- Message ----- From: Justin Wallace Sent: 02/19/2023   1:09 PM EDT To: [facility] Procedure Requests Subject: US Biopsy                                      IR Approval Request:   Procedure:   US guided core RT LN biopsy  Reason:       New and increasingly hypermetabolic cervical lymph nodes, indicative of disease progression   History:       PET scan   01/08/2023   CT soft tissue neck w/contrast   02/08/2023  Provider:     Dr Rickard Patience, MD  Provider Contact #    [facility]   [phone]

## 2023-02-21 NOTE — Telephone Encounter (Signed)
Pt left a vm, stating that he was calling to tell Lanora Manis that he was confirming an appt on tues. Aug 13 and he will see appt in mychart.    Please advise if I need to schedule anything, thank you

## 2023-02-21 NOTE — Telephone Encounter (Signed)
Pt will be scheduled Tues 8/13 at 1p an arrive at 12:30p.

## 2023-03-06 ENCOUNTER — Other Ambulatory Visit (HOSPITAL_COMMUNITY): Payer: Self-pay

## 2023-03-08 ENCOUNTER — Other Ambulatory Visit (HOSPITAL_COMMUNITY): Payer: Self-pay

## 2023-03-11 ENCOUNTER — Other Ambulatory Visit (HOSPITAL_COMMUNITY): Payer: Self-pay

## 2023-03-11 ENCOUNTER — Encounter (HOSPITAL_COMMUNITY): Payer: Self-pay

## 2023-03-11 NOTE — Progress Notes (Signed)
Patient for US guided Core RT Cervical LN Biopsy on Tuesday 03/12/2023, I called and LVM for the patient on the phone and gave pre-procedure instructions. VM made pt aware to be here at 12:30p and check in at the [facility]. Called 03/11/2023

## 2023-03-12 ENCOUNTER — Ambulatory Visit
Admission: RE | Admit: 2023-03-12 | Discharge: 2023-03-12 | Disposition: A | Discharge status: Home / Self Care | Payer: Medicare HMO | Source: Ambulatory Visit | Attending: Oncology | Admitting: Oncology

## 2023-03-12 VITALS — BP 125/66 | HR 44

## 2023-03-12 DIAGNOSIS — R590 Localized enlarged lymph nodes: Secondary | ICD-10-CM | POA: Insufficient documentation

## 2023-03-12 DIAGNOSIS — R591 Generalized enlarged lymph nodes: Secondary | ICD-10-CM | POA: Diagnosis not present

## 2023-03-12 DIAGNOSIS — D360 Benign neoplasm of lymph nodes: Secondary | ICD-10-CM | POA: Diagnosis not present

## 2023-03-12 MED ORDER — LIDOCAINE HCL (PF) 1 % IJ SOLN
10.0000 mL | Freq: Once | INTRAMUSCULAR | Status: AC
  Administered 2023-03-12: 10 mL via INTRADERMAL
  Filled 2023-03-12: qty 10

## 2023-03-13 ENCOUNTER — Other Ambulatory Visit (HOSPITAL_COMMUNITY): Payer: Self-pay

## 2023-03-14 LAB — SURGICAL PATHOLOGY

## 2023-03-15 ENCOUNTER — Other Ambulatory Visit (HOSPITAL_COMMUNITY): Payer: Self-pay

## 2023-03-18 ENCOUNTER — Telehealth: Payer: Self-pay

## 2023-03-18 NOTE — Telephone Encounter (Signed)
Please schedule and inform pt of appt:   MD only (for biopsy results), (Wednesday this week look like a better day)

## 2023-03-18 NOTE — Telephone Encounter (Signed)
-----   Message from Rickard Patience sent at 03/16/2023 11:52 AM EDT ----- Please arrange him to see me for discussion ASAP MD only thanks.

## 2023-03-25 ENCOUNTER — Inpatient Hospital Stay: Payer: Medicare HMO | Attending: Oncology | Admitting: Oncology

## 2023-03-25 ENCOUNTER — Encounter: Payer: Self-pay | Admitting: Oncology

## 2023-03-25 VITALS — BP 105/57 | HR 55 | Resp 18 | Wt 171.0 lb

## 2023-03-25 DIAGNOSIS — Z95828 Presence of other vascular implants and grafts: Secondary | ICD-10-CM | POA: Diagnosis not present

## 2023-03-25 DIAGNOSIS — B20 Human immunodeficiency virus [HIV] disease: Secondary | ICD-10-CM | POA: Insufficient documentation

## 2023-03-25 DIAGNOSIS — C8510 Unspecified B-cell lymphoma, unspecified site: Secondary | ICD-10-CM

## 2023-03-25 DIAGNOSIS — C8518 Unspecified B-cell lymphoma, lymph nodes of multiple sites: Secondary | ICD-10-CM | POA: Diagnosis not present

## 2023-03-25 NOTE — Progress Notes (Signed)
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 IHC MYC+, BCL 6+, negative translocation. Status post 6 cycles of dose adjusted EPOCH with rituximab.  Patient had +1 dose level increase for cycle 2/cycle 3, +2 dose level increase for cycle 4, +1 dose level for cycle 5 and 6. Intrathecal MTX Patient is doing well.  No constitutional symptoms PET scan imaging was reviewed by me and discussed with patient and wife. Hypermetabolic small neck lymph nodes, persistent and some of these nodes are new. Growing size on recent PET and CT CT guided cervical lymphadenopathy biopsy pathology results were reviewed and discussed with patient.  Limited lymphoid tissue, flowcytometry showed a small fraction of B cells (17%) shows CD10 expression and kappa light chain excess. The B-cell changes are limited but in view of the history of B-cell lymphoma, a B-cell lymphoproliferative process is not excluded. Previous high grade lymphoma is CD10 positive. Possible recurrence.  excisional biopsy may help to get additional tissue to confirm diagnosis.  Recommend second opinion at tertiary center. 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   continue antiretroviral therapy   Port-A-Cath in place Port flush every 8 weeks.   No orders of the defined types were placed in this encounter.  Follow up TBD All questions were answered. The patient knows to call the clinic with any problems, questions or concerns.  Rickard Patience, MD, PhD [facility] Hematology Oncology 03/25/2023   CHIEF COMPLAINTS/REASON FOR VISIT:  Follow up for HIV  related high-grade B-cell lymphoma   HISTORY OF PRESENTING ILLNESS:   Justin Wallace is a  77 y.o.  male with PMH listed below was seen in consultation at the request of  Saralyn Pilar *  for evaluation of symptomatic anemia Patient reported history of sudden onset of diffuse body aches, decreased appetite, headache low-grade fever, profound weakness/fatigue about 2 weeks ago.  Prior to the onset of symptoms, he went to a funeral as well as applicable clinic.  He reports that he has received COVID-19 vaccination previously.  He moved from [place].  Daughter is an Charity fundraiser and told him to get COVID-19 PCR checked and he was tested negative. Patient also reports a sudden drop of weight since the onset of symptoms.  Patient has a history of celiac disease.  No colonoscopy records or pathology records in current EMR.  Diagnosis was done by [facility] gastroenterology.  Patient reports that he was in his usual state of health until the onset of symptoms.  03/10/2020, patient was evaluated by primary care provider.  Blood work showed mild anemia with hemoglobin of 13.1, Iron panel showed decreased saturation of 8, ferritin 218, TIBC 291, patient was referred to hematology for evaluation of symptomatic anemia and iron deficiency.  Patient reports that her body aches, fever and headache symptoms have improved.  However he continues to feel very tired and fatigued.  Appetite has decreased and weight remains low.  # Physical examination showed left axillary mass  ultrasound confirmed a left axillary large soft tissue mass up to 6.8 cm with internal vascularity. 03/30/2020 CT chest with contrast showed soft tissue mass/enlarged lymph node in the left axilla 6.3 cm, concerning  for primary mass or metastatic lymphadenopathy.  There are additional prominent although much smaller right axillary lymph nodes, pretracheal and superior mediastinal lymph nodes.  Findings are concerning for malignancy such as lymphoma or  nodal metastatic disease.  # treated for Lymes disease, finished doxycycline 100mg  BID for 21 days course. 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 he has started treatment with Biktarvy, also started on Bactrim daily.  04/26/2020 PET scan showed large intensely hypermetabolic left axillary mass SUV 28.  Additional small bilateral axilla lymph node with mild metabolic activity SUV 2.  Metabolic activity through the porta hepatis region without enlarged nodes identified.  No bone lesions. 05/02/2020 bone marrow biopsy showed hypercellular marrow for age with trilineage hematopoiesis.  Increased number of CD10 positive B cells, cannot rule out minimal or subtle involvement of marrow by the high-grade B-cell lymphoma. Normal cytogenetics. 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  Positive for BCL6 and MYC expression.  FISH testing showed negative for BCL-2/BCL6/MYC gene rearrangement.   Trisomy 3 and 18 were identified and are nonspecific findings seen in clonal lymphoid neoplasm.  Final results were signed out on 05/30/2020-due to the delay of  FISH results.  Bsed on the FISH results, lymphoma is best classified as high-grade B-cell lymphoma not otherwise specified.  It does not meet morphological criteria for diffuse large B-cell lymphoma nor molecular criteria for Burkitt lymphoma.  11 q. alteration testing has been sent.  Results are pending. 04/26/2020, MUGA testing showed normal LVEF of 56% with normal LV wall motion.   05/13/2020, echocardiogram showed low normal end ejection fraction of 50 to 55%.  #04/19/2020 Mediport placed by Dr. Wyn Quaker # Chemotherapy # 06/06/2020 cycle 1 R-EPOCH +GCSF                    Intrathecal MTX # 06/27/2020 cycle 2 R- EPOCH +GCSF [+ 1 level]  Intrathecal MTX # 07/18/2020 cycle 3 R -EPOCH +GCSF [+ 1 level]  Intrathecal MTX # 08/03/2020 PET negative. Deuville 3 # 08/08/2020 cycle 4 R EPOCH +GCSF  [+ 2  level]  Intrathecal MTX- did not tolerate due to cytopenia and mucositis # 08/29/2020 cycle 5 R -EPOCH +GCSF [+ 1 level]  Intrathecal MTX # 09/19/2020 cycle 6 R -EPOCH +GCSF [+ 1 level]  Intrathecal MTX  5/5/ 2022 Post treatment PET  - Reduced size and activity of the prior left axillary lymph node, currently Deauville 2 - Deauville 5 activity in a low-density collection adjacent to the right proximal biceps muscle - [place] Deauville 4 left inguinal lymph nodes   02/20/2021 follow up PET scan- Deauville 2 -Continued decrease in size and abnormal FDG activity in the index left axillary lymph node -near complete resolution of the abnormal FDG avidity in the left inguinal lymph nodes - Decreased hypermetabolic activity associated with the low-density fluid collection along the right proximal bicep muscle, favored to represent synovitis/bursitis   #10/03/2021, upper endoscopy showed congested and nodular mucosa in the greater curvature, antrum and prepyloric region of the stomach.  Biopsied. 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  09/05/2021, PET showed diffuse hypermetabolic activity throughout the stomach.-Endoscopy biopsy showed gastritis. Low-density peripheral hypermetabolic lesion in the right shoulder anterior to the biceps muscle-this was previously thought to be bursitis/synovitis.-I will obtain right axillary ultrasound and  likely needle biopsy. New segmental nodular airspace in the right upper lobe-likely secondary to infection, patient was treated with a course of antibiotics and symptom resolved.  02/19/2022, PET scan showed small dural 4 lymph nodes in the neck-right level IIA lymph node has slightly increased metabolic activity compared to prior examination.  Slightly decreased in  size.  Small left level IIA lymph nodes substantially reducing activity compared to prior.  Previous right upper lobe hypermetabolic airspace opacity resolved.   Right axilla mass has reduced activity compared to prior examination.  Diffuse gastric activity is substantially improved from prior.  Other chronic changes.  + Right axillary hypermetabolic cystic mass. 03/09/2022, patient underwent right axillary cystic mass biopsy, pathology showed necrotizing granulomatous, AFB stain positive for mycobacteria. 04/03/2022, repeat biopsy was performed and stained positive but culture without growth. 05/04/2022, he reported to ID doctor that he had pus drained from the node after his September 2023 biopsy and this relieved some of his symptoms.  Pain is much better now.  The mass has decreased in size.  He is now currently has minimal drainage.  06/25/22 PET showed  1. Similar size of hypermetabolic small cervical nodes. The right-sided node is slightly increased in hypermetabolism today. (Deauville) 5  2. No new sites of disease identified. 3. Increased hypermetabolism within the stomach, without CT correlate, suggesting gastritis. 4. Persistent hypermetabolism about the anterior right deltoid muscle, at the site of less well-defined fluid/edema than on the prior exam, nonspecific. Possibly a ganglion cyst. 5. Incidental findings, including: Prostatomegaly. 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 After his 5/6 forehead skin surgery, he developed swelling of right facial soft tissue, his dermatologist prescribed amitotics which he finished around 12/23/2022. He is compliant with his HIV medications.  No new complaints. 01/08/2023 PET scan restaging showed Increasingly hypermetabolic cervical nodes Hypermetabolic right level II lymph node measures 8 mm (4/23), SUV max 8.0, compared to 7 mm and SUV max 5.1 previously. New hypermetabolic right  submandibular lymph node measures 5 mm (4/26), SUV max 4.4. New inferior right level II lymph node measures 7 mm (4/28), SUV max 6.1. 6 mm left level II lymph node (4/24), SUV max 6.3, compared to 5 mm and SUV max 3.0 previously.  Residual borderline hypermetabolism associated with nodular thickening of right axilla. Persistent gastric uptake may be due to chronic gastritis.  Enlarged prostate.  Aortic atherosclerosis   INTERVAL HISTORY Justin Wallace is a 77 y.o. male who has above history reviewed by me today presents for follow up visit for high-grade B-cell lymphoma  He reports feeling well. Denies weight loss, fever, chills, fatigue, night sweats. S/p US guided FNA of right cervical lymphadenopathy He has no new complaints.    Review of Systems  Constitutional:  Negative for appetite change, chills, fatigue, fever and unexpected weight change.  HENT:   Negative for hearing loss, mouth sores and voice change.   Eyes:  Negative for eye problems and icterus.  Respiratory:  Negative for chest tightness, cough and shortness of breath.   Cardiovascular:  Negative for chest pain and leg swelling.  Gastrointestinal:  Negative for abdominal distention and abdominal pain.  Endocrine: Negative for hot flashes.  Genitourinary:  Negative for difficulty urinating, dysuria and frequency.   Musculoskeletal:  Negative for arthralgias.  Skin:  Negative for itching and rash.  Neurological:  Positive for numbness. Negative for light-headedness.  Hematological:  Negative for adenopathy. Does not bruise/bleed easily.  Psychiatric/Behavioral:  Negative for confusion.     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HCC) 05/27/2020   HIV (human immunodeficiency virus infection) (HCC)    Lyme disease    Lymphoma (HCC)     SURGICAL HISTORY: Past Surgical History:  Procedure Laterality Date   ESOPHAGOGASTRODUODENOSCOPY (EGD) WITH PROPOFOL N/A  10/03/2021   Procedure: ESOPHAGOGASTRODUODENOSCOPY (EGD) WITH PROPOFOL;  Surgeon: Wyline Mood, MD;  Location[facility] ENDOSCOPY;  Service: Gastroenterology;  Laterality: N/A;   PORTA CATH INSERTION N/A 04/25/2020   Procedure: PORTA CATH INSERTION;  Surgeon: Annice Needy, MD;  Location: [facility] INVASIVE CV LAB;  Service: Cardiovascular;  Laterality: N/A;    SOCIAL HISTORY: Social History   Socioeconomic History   Marital status: Married    Spouse name: Erskine Squibb    Number of children: 2   Years of education: Not on file   Highest education level: Not on file  Occupational History   Not on file  Tobacco Use   Smoking status: Never   Smokeless tobacco: Never  Vaping Use   Vaping status: Never Used  Substance and Sexual Activity   Alcohol use: Yes    Comment: occasional   Drug use: Never   Sexual activity: Never  Other Topics Concern   Not on file  Social History Narrative   Not on file   Social Determinants of Health   Financial Resource Strain: Not on file  Food Insecurity: No Food Insecurity (05/04/2022)   Received [facility], [facility]   Hunger Vital Sign    Worried About Running Out of Food in the Last Year: Never true    Ran Out of Food in the Last Year: Never true  Transportation Needs: Not on file  Physical Activity: Not on file  Stress: Not on file  Social Connections: Not on file  Intimate Partner Violence: Not on file    FAMILY HISTORY: Family History  Problem Relation Age of Onset   Hyperlipidemia Mother    Brain cancer Father     ALLERGIES:  has No Known Allergies.  MEDICATIONS:  Current Outpatient Medications  Medication Sig Dispense Refill   bictegravir-emtricitabine-tenofovir AF (BIKTARVY) 50-200-25 MG TABS tablet TAKE 1 TABLET BY MOUTH DAILY. 30 tablet 11   Multiple Vitamin (MULTIVITAMIN ADULT PO) Take 1 tablet by mouth daily.     No current facility-administered medications for this visit.     PHYSICAL  EXAMINATION: ECOG PERFORMANCE STATUS: 1 - Symptomatic but completely ambulatory Vitals:   03/25/23 1051 03/25/23 1055  BP:  (!) 105/57  Pulse: (!) 55   Resp: 18   SpO2: 100%    Filed Weights   03/25/23 1051  Weight: 171 lb (77.6 kg)    Physical Exam Constitutional:      General: He is not in acute distress. HENT:     Head: Normocephalic and atraumatic.  Eyes:     General: No scleral icterus. Neck:     Comments: I am not able to appreciate the cervical lymphadenopathy Cardiovascular:     Rate and Rhythm: Normal rate.  Pulmonary:     Effort: Pulmonary effort is normal. No respiratory distress.  Abdominal:     General: There is no distension.  Musculoskeletal:        General: No deformity. Normal range of motion.     Cervical back: Normal range of motion and neck supple.  Skin:    Findings: No  erythema or rash.  Neurological:     Mental Status: He is alert and oriented to person, place, and time. Mental status is at baseline.     Cranial Nerves: No cranial nerve deficit.  Psychiatric:        Mood and Affect: Mood normal.       LABORATORY DATA:  I have reviewed the data as listed    Latest Ref Rng & Units 01/08/2023   10:06 AM 06/25/2022    8:46 AM 02/22/2022   10:41 AM  CBC  WBC 4.0 - 10.5 K/uL 5.2  5.1  5.4   Hemoglobin 13.0 - 17.0 g/dL 40.1  02.7  25.3   Hematocrit 39.0 - 52.0 % 39.4  42.6  39.0   Platelets 150 - 400 K/uL 177  218  178        Latest Ref Rng & Units 01/08/2023   10:06 AM 06/25/2022    8:46 AM 02/22/2022   10:41 AM  CMP  Glucose 70 - 99 mg/dL 95  664  403   BUN 8 - 23 mg/dL 17  24  25    Creatinine 0.61 - 1.24 mg/dL 4.74  2.59  5.63   Sodium 135 - 145 mmol/L 139  138  139   Potassium 3.5 - 5.1 mmol/L 4.0  4.3  4.4   Chloride 98 - 111 mmol/L 110  108  108   CO2 22 - 32 mmol/L 23  27  26    Calcium 8.9 - 10.3 mg/dL 8.3  8.7  8.8   Total Protein 6.5 - 8.1 g/dL 6.4  6.8  6.2   Total Bilirubin 0.3 - 1.2 mg/dL 0.9  0.5  0.9   Alkaline Phos  38 - 126 U/L 66  76  62   AST 15 - 41 U/L 19  17  22    ALT 0 - 44 U/L 14  11  15         RADIOGRAPHIC STUDIES: I have personally reviewed the radiological images as listed and agreed with the findings in the report. [place] FNA SOFT TISSUE  Result Date: 03/12/2023 INDICATION: Cervical lymphadenopathy EXAM: Ultrasound-guided fine-needle aspiration of right cervical lymph node MEDICATIONS: None. ANESTHESIA/SEDATION: Local analgesia COMPLICATIONS: None immediate. PROCEDURE: Informed written consent was obtained from the patient after a thorough discussion of the procedural risks, benefits and alternatives. All questions were addressed. Maximal Sterile Barrier Technique was utilized including caps, mask, sterile gowns, sterile gloves, sterile drape, hand hygiene and skin antiseptic. A timeout was performed prior to the initiation of the procedure. The patient was placed supine on the exam table. Ultrasound of the right neck demonstrated borderline enlarged right cervical lymph node compatible with recent PET-CT. Skin entry site was marked, and the overlying skin was prepped draped in the standard sterile fashion. Local analgesia was obtained with 1% lidocaine. Using ultrasound guidance, fine needle aspiration of the identified right cervical lymph node was performed using 25 gauge needle x5 total passes. Specimens were submitted to the cytotechnologist for further handling. Sample adequacy was determined. Limited postprocedure imaging demonstrated no hematoma. A clean dressing was placed after manual hemostasis. The patient tolerated the procedure well without immediate complication. IMPRESSION: Successful ultrasound-guided fine-needle aspiration of borderline enlarged right cervical lymph node corresponding to recent PET-CT. Electronically Signed   By: Olive Bass M.D.   On: 03/12/2023 16:15    [place] FNA SOFT TISSUE  Result Date: 03/12/2023 INDICATION: Cervical lymphadenopathy EXAM: Ultrasound-guided fine-needle  aspiration of right cervical lymph node MEDICATIONS: None.  ANESTHESIA/SEDATION: Local analgesia COMPLICATIONS: None immediate. PROCEDURE: Informed written consent was obtained from the patient after a thorough discussion of the procedural risks, benefits and alternatives. All questions were addressed. Maximal Sterile Barrier Technique was utilized including caps, mask, sterile gowns, sterile gloves, sterile drape, hand hygiene and skin antiseptic. A timeout was performed prior to the initiation of the procedure. The patient was placed supine on the exam table. Ultrasound of the right neck demonstrated borderline enlarged right cervical lymph node compatible with recent PET-CT. Skin entry site was marked, and the overlying skin was prepped draped in the standard sterile fashion. Local analgesia was obtained with 1% lidocaine. Using ultrasound guidance, fine needle aspiration of the identified right cervical lymph node was performed using 25 gauge needle x5 total passes. Specimens were submitted to the cytotechnologist for further handling. Sample adequacy was determined. Limited postprocedure imaging demonstrated no hematoma. A clean dressing was placed after manual hemostasis. The patient tolerated the procedure well without immediate complication. IMPRESSION: Successful ultrasound-guided fine-needle aspiration of borderline enlarged right cervical lymph node corresponding to recent PET-CT. Electronically Signed   By: Olive Bass M.D.   On: 03/12/2023 16:15   CT SOFT TISSUE NECK W CONTRAST  Result Date: 02/14/2023 CLINICAL DATA:  Lymphadenopathy.  Lymphoma. EXAM: CT NECK WITH CONTRAST TECHNIQUE: Multidetector CT imaging of the neck was performed using the standard protocol following the bolus administration of intravenous contrast. RADIATION DOSE REDUCTION: This exam was performed according to the departmental dose-optimization program which includes automated exposure control, adjustment of the mA and/or kV  according to patient size and/or use of iterative reconstruction technique. CONTRAST:  75mL OMNIPAQUE IOHEXOL 300 MG/ML  SOLN COMPARISON:  PET scan 01/08/2023.  CT neck with contrast 09/25/22. FINDINGS: Pharynx and larynx: No focal mucosal or submucosal lesions are present. The nasopharynx is clear. Soft palate and tongue base are within normal limits. Vallecula and epiglottis are within normal limits. Aryepiglottic folds and piriform sinuses are clear. Vocal cords are midline and symmetric. Trachea is clear. Salivary glands: The submandibular and parotid glands and ducts are within normal limits. Thyroid: Normal. Lymph nodes: A right level 2 a lymph node is slightly increased in size, now measuring 12 x 7 mm on the sagittal series. This corresponds with the FDG avid node. A left level 2A lymph node is also increased slightly in size, now measuring 12 x 6 mm on the sagittal series. No new nodes are present. Vascular: Atherosclerotic calcifications are present in the proximal right ICA without a significant stenosis relative to the more distal vessel. Limited intracranial: Within normal limits. Visualized orbits: Bilateral lens replacements are noted. Globes and orbits are otherwise unremarkable. Mastoids and visualized paranasal sinuses: Shrunken maxillary sinuses are opacified bilaterally. The paranasal sinuses and mastoid air cells are otherwise clear. Skeleton: Chronic loss of disc height is present at C5-6, unchanged. Vertebral body heights and alignment are otherwise normal. Straightening of the normal cervical lordosis is within normal limits. Upper chest: The lung apices are clear. Mild dependent atelectasis is present both lungs. IMPRESSION: 1. Slight increase in size of bilateral level 2A lymph nodes, right greater than left. 2. No new nodes. 3. Chronic loss of disc height at C5-6 is unchanged. 4. Chronic bilateral maxillary sinus disease. 5. Atherosclerosis in the proximal right ICA without a significant  stenosis relative to the more distal vessel. Electronically Signed   By: Marin Roberts M.D.   On: 02/14/2023 16:46   NM PET Image Restag (PS) Skull Base To Thigh  Result  Date: 01/11/2023 CLINICAL DATA:  Subsequent treatment strategy for lymphoma. EXAM: NUCLEAR MEDICINE PET SKULL BASE TO THIGH TECHNIQUE: 9.8 mCi F-18 FDG was injected intravenously. Full-ring PET imaging was performed from the skull base to thigh after the radiotracer. CT data was obtained and used for attenuation correction and anatomic localization. Fasting blood glucose: 80 mg/dl COMPARISON:  CT neck 29/56/2130 and PET 06/25/2022. FINDINGS: Mediastinal blood pool activity: SUV max 2.5 Liver activity: SUV max 3.1 NECK: Hypermetabolic right level II lymph node measures 8 mm (4/23), SUV max 8.0, compared to 7 mm and SUV max 5.1 previously. New hypermetabolic right submandibular lymph node measures 5 mm (4/26), SUV max 4.4. New inferior right level II lymph node measures 7 mm (4/28), SUV max 6.1. 6 mm left level II lymph node (4/24), SUV max 6.3, compared to 5 mm and SUV max 3.0 previously. Incidental CT findings: None. CHEST: Residual borderline hypermetabolism associated with nodular thickening in the right axilla, overlying the right deltoid muscle (6 mm, SUV max 3.0), unchanged and possibly postprocedural in etiology. Otherwise, no abnormal hypermetabolism. Incidental CT findings: Right IJ Port-A-Cath terminates at the SVC RA junction. Atherosclerotic calcification of the aorta and coronary arteries. Heart is enlarged. No pericardial or pleural effusion. ABDOMEN/PELVIS: Persistent gastric uptake, SUV max 6.4, similar. Left common iliac lymph node measures 9 mm (4/125), unchanged, and does not show metabolism above blood pool, SUV max 2.2. No additional abnormal hypermetabolism. Incidental CT findings: Probable cyst in the left hepatic lobe. Liver, gallbladder, adrenal glands and right kidney are otherwise grossly unremarkable.  Low-attenuation lesions in the left kidney. No specific follow-up necessary. Spleen, pancreas, stomach and bowel are otherwise grossly unremarkable. Enlarged nodular prostate indents the bladder. Bladder wall thickening. SKELETON: No abnormal hypermetabolism. Incidental CT findings: Degenerative changes in the spine. IMPRESSION: 1. New and increasingly hypermetabolic cervical lymph nodes, indicative of disease progression (Deauville 5). 2. Persistent gastric uptake may be due to chronic gastritis. 3. Enlarged prostate. Associated bladder wall thickening is indicative of an element of outlet obstruction. 4.  Aortic atherosclerosis (ICD10-I70.0). Electronically Signed   By: Leanna Battles M.D.   On: 01/11/2023 08:56

## 2023-03-25 NOTE — Assessment & Plan Note (Addendum)
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 Status post 6 cycles of dose adjusted EPOCH with rituximab.  Patient had +1 dose level increase for cycle 2/cycle 3, +2 dose level increase for cycle 4, +1 dose level for cycle 5 and 6. Intrathecal MTX Patient is doing well.  No constitutional symptoms PET scan imaging was reviewed by me and discussed with patient and wife. Hypermetabolic small neck lymph nodes, persistent and some of these nodes are new. Growing size on recent PET and CT CT guided cervical lymphadenopathy biopsy pathology results were reviewed and discussed with patient.  Limited lymphoid tissue, flowcytometry showed a small fraction of B cells (17%) shows CD10 expression and kappa light chain excess. The B-cell changes are limited but in view of the history of B-cell lymphoma, a B-cell lymphoproliferative process is not excluded. Previous high grade lymphoma is CD10 positive. Possible recurrence.  excisional biopsy may help to get additional tissue to confirm diagnosis.  Recommend second opinion at tertiary center. He prefers [facility], will refer to [facility]

## 2023-03-25 NOTE — Assessment & Plan Note (Signed)
AIDS patient follows with ID physician.   continue antiretroviral therapy  

## 2023-03-25 NOTE — Assessment & Plan Note (Signed)
Port flush every 8 weeks.  

## 2023-04-09 ENCOUNTER — Other Ambulatory Visit (HOSPITAL_COMMUNITY): Payer: Self-pay

## 2023-04-10 ENCOUNTER — Other Ambulatory Visit (HOSPITAL_COMMUNITY): Payer: Self-pay

## 2023-04-10 DIAGNOSIS — C8590 Non-Hodgkin lymphoma, unspecified, unspecified site: Secondary | ICD-10-CM | POA: Diagnosis not present

## 2023-04-12 ENCOUNTER — Other Ambulatory Visit (HOSPITAL_COMMUNITY): Payer: Self-pay

## 2023-04-23 DIAGNOSIS — Z872 Personal history of diseases of the skin and subcutaneous tissue: Secondary | ICD-10-CM | POA: Diagnosis not present

## 2023-04-23 DIAGNOSIS — Z79899 Other long term (current) drug therapy: Secondary | ICD-10-CM | POA: Diagnosis not present

## 2023-04-23 DIAGNOSIS — C8374 Burkitt lymphoma, lymph nodes of axilla and upper limb: Secondary | ICD-10-CM | POA: Diagnosis not present

## 2023-04-23 DIAGNOSIS — B20 Human immunodeficiency virus [HIV] disease: Secondary | ICD-10-CM | POA: Diagnosis not present

## 2023-04-23 DIAGNOSIS — Z23 Encounter for immunization: Secondary | ICD-10-CM | POA: Diagnosis not present

## 2023-05-06 ENCOUNTER — Other Ambulatory Visit: Payer: Self-pay

## 2023-05-06 NOTE — Progress Notes (Signed)
Specialty Pharmacy Refill Coordination Note  Justin Wallace is a 77 y.o. male contacted today regarding refills of specialty medication(s) Bictegravir-Emtricitab-Tenofov   Patient requested Delivery   Delivery date: 05/22/23   Verified address: [address]   Medication will be filled on 05/21/23.

## 2023-05-06 NOTE — Progress Notes (Signed)
Specialty Pharmacy Ongoing Clinical Assessment Note  Justin Wallace is a 77 y.o. male who is being followed by the specialty pharmacy service for RxSp HIV   Patient's specialty medication(s) reviewed today: Bictegravir-Emtricitab-Tenofov   Missed doses in the last 4 weeks: 2   Patient did not have any additional questions or concerns.   Therapeutic benefit summary: Patient is achieving benefit   Adverse events/side effects summary: No adverse events/side effects   Patient's therapy is appropriate to: Continue    Goals Addressed             This Visit's Progress    Achieve Undetectable HIV Viral Load < 20       Patient is on track. Patient will maintain adherence         Follow up:  6 months

## 2023-06-06 ENCOUNTER — Other Ambulatory Visit (HOSPITAL_COMMUNITY): Payer: Self-pay

## 2023-06-10 ENCOUNTER — Other Ambulatory Visit: Payer: Self-pay

## 2023-06-12 ENCOUNTER — Other Ambulatory Visit (HOSPITAL_COMMUNITY): Payer: Self-pay | Admitting: Pharmacy Technician

## 2023-06-12 ENCOUNTER — Other Ambulatory Visit: Payer: Self-pay

## 2023-06-12 ENCOUNTER — Other Ambulatory Visit (HOSPITAL_COMMUNITY): Payer: Self-pay

## 2023-06-12 NOTE — Progress Notes (Signed)
Specialty Pharmacy Refill Coordination Note  Justin Wallace is a 77 y.o. male contacted today regarding refills of specialty medication(s) Bictegravir-Emtricitab-Tenofov   Patient requested Delivery   Delivery date: 06/25/23   Verified address: Patient address [address]  [place]   Medication will be filled on 06/24/23.

## 2023-06-24 ENCOUNTER — Other Ambulatory Visit: Payer: Self-pay

## 2023-07-10 ENCOUNTER — Other Ambulatory Visit: Payer: Self-pay

## 2023-07-16 ENCOUNTER — Other Ambulatory Visit: Payer: Self-pay

## 2023-07-16 NOTE — Progress Notes (Signed)
Specialty Pharmacy Refill Coordination Note  Justin Wallace is a 77 y.o. male contacted today regarding refills of specialty medication(s) Bictegravir-Emtricitab-Tenofov [name]   Patient requested Delivery   Delivery date: 07/26/23   Verified address: Patient address [address]  [place]   Medication will be filled on 07/25/23.

## 2023-08-13 ENCOUNTER — Other Ambulatory Visit: Payer: Self-pay

## 2023-08-20 ENCOUNTER — Other Ambulatory Visit: Payer: Self-pay

## 2023-08-20 NOTE — Progress Notes (Signed)
Specialty Pharmacy Refill Coordination Note  Justin Wallace is a 78 y.o. male contacted today regarding refills of specialty medication(s) Bictegravir-Emtricitab-Tenofov [name]   Patient requested Delivery   Delivery date: 08/23/23   Verified address: [address]   [place] [address]   Medication will be filled on 08/22/23.

## 2023-08-22 ENCOUNTER — Other Ambulatory Visit (HOSPITAL_COMMUNITY): Payer: Self-pay

## 2023-08-22 ENCOUNTER — Other Ambulatory Visit: Payer: Self-pay

## 2023-09-03 ENCOUNTER — Other Ambulatory Visit: Payer: Self-pay

## 2023-09-03 ENCOUNTER — Ambulatory Visit: Payer: Medicare HMO | Admitting: Internal Medicine

## 2023-09-03 ENCOUNTER — Encounter: Payer: Self-pay | Admitting: Internal Medicine

## 2023-09-03 VITALS — BP 119/68 | HR 63 | Temp 98.5°F | Wt 181.0 lb

## 2023-09-03 DIAGNOSIS — T451X5A Adverse effect of antineoplastic and immunosuppressive drugs, initial encounter: Secondary | ICD-10-CM | POA: Diagnosis not present

## 2023-09-03 DIAGNOSIS — B20 Human immunodeficiency virus [HIV] disease: Secondary | ICD-10-CM

## 2023-09-03 DIAGNOSIS — G620 Drug-induced polyneuropathy: Secondary | ICD-10-CM

## 2023-09-03 DIAGNOSIS — Z7185 Encounter for immunization safety counseling: Secondary | ICD-10-CM

## 2023-09-03 MED ORDER — GABAPENTIN 100 MG PO CAPS: 200.0000 mg | ORAL_CAPSULE | Freq: Every day | ORAL | 3 refills | Status: DC | PRN

## 2023-09-03 MED ORDER — BIKTARVY 50-200-25 MG PO TABS: 1.0000 | ORAL_TABLET | Freq: Every day | ORAL | 11 refills | Status: DC

## 2023-09-03 NOTE — Progress Notes (Signed)
Patient has ADAP and must fill through [facility].

## 2023-09-03 NOTE — Progress Notes (Signed)
   Subjective:    Patient ID: Justin Wallace, male    DOB: 10-22-1945, 78 y.o.   MRN: [id number]  HPI He is here for follow-up of HIV He continues on Biktarvy  and denies any missed doses.  He is here for his yearly follow-up.  No new issues since last visit.  He did have some concern of lymphadenopathy in his neck and went to [facility] and did not find any concerns.   Review of Systems  Constitutional:  Negative for fatigue.  Gastrointestinal:  Negative for diarrhea.       Objective:   Physical Exam Eyes:     General: No scleral icterus. Pulmonary:     Effort: Pulmonary effort is normal.  Neurological:     Mental Status: He is alert.   SH: no tobacco        Assessment & Plan:

## 2023-09-03 NOTE — Assessment & Plan Note (Signed)
Just his neuropathy and occasional gabapentin use.  Did refill this for him and he can take as needed.

## 2023-09-03 NOTE — Assessment & Plan Note (Signed)
Discussed the flu shot with him and he declined

## 2023-09-03 NOTE — Assessment & Plan Note (Signed)
He continues to do well on Biktarvy and no changes indicated.  Reviewed previous labs with him.  Will check his labs today as well and he can return in 11 to 12 months.

## 2023-09-05 LAB — T-HELPER CELL (CD4) - (RCID CLINIC ONLY)
CD4 % Helper T Cell: 22 % — ABNORMAL LOW (ref 33–65)
CD4 T Cell Abs: 378 /uL — ABNORMAL LOW (ref 400–1790)

## 2023-09-05 LAB — HIV-1 RNA QUANT-NO REFLEX-BLD
HIV 1 RNA Quant: <20 {copies}/mL — ABNORMAL HIGH
HIV-1 RNA Quant, Log: <1.3 {Log} — ABNORMAL HIGH

## 2023-10-08 ENCOUNTER — Other Ambulatory Visit: Payer: Self-pay

## 2023-10-08 ENCOUNTER — Other Ambulatory Visit: Payer: Self-pay | Admitting: Pharmacist

## 2023-10-08 ENCOUNTER — Other Ambulatory Visit (HOSPITAL_COMMUNITY): Payer: Self-pay

## 2023-10-08 DIAGNOSIS — B20 Human immunodeficiency virus [HIV] disease: Secondary | ICD-10-CM

## 2023-10-08 MED ORDER — BIKTARVY 50-200-25 MG PO TABS
1.0000 | ORAL_TABLET | Freq: Every day | ORAL | 11 refills | Status: DC
  Filled 2023-10-08: qty 30, 30d supply, fill #0
  Filled 2023-10-31 (×2): qty 30, 30d supply, fill #1
  Filled 2023-11-27: qty 30, 30d supply, fill #2
  Filled 2024-01-10: qty 30, 30d supply, fill #3
  Filled 2024-02-06 – 2024-02-17 (×2): qty 30, 30d supply, fill #4
  Filled 2024-03-16: qty 30, 30d supply, fill #5
  Filled 2024-04-13: qty 30, 30d supply, fill #6
  Filled 2024-05-18 – 2024-05-21 (×2): qty 30, 30d supply, fill #7
  Filled 2024-06-18 – 2024-06-19 (×2): qty 30, 30d supply, fill #8
  Filled 2024-07-22 – 2024-08-03 (×3): qty 30, 30d supply, fill #9
  Filled 2024-08-27: qty 30, 30d supply, fill #10

## 2023-10-08 NOTE — Progress Notes (Signed)
 Specialty Pharmacy Initiation Note   Justin Wallace is a 78 y.o. male who will be followed by the specialty pharmacy service for RxSp HIV    Review of administration, indication, effectiveness, safety, potential side effects, storage/disposable, and missed dose instructions occurred today for patient's specialty medication(s) Bictegravir-Emtricitab-Tenofov [facility])     Patient/Caregiver did not have any additional questions or concerns.   Patient's therapy is appropriate to: Continue    Goals Addressed   None     Jennette Kettle Specialty Pharmacist

## 2023-10-08 NOTE — Progress Notes (Signed)
 Specialty Pharmacy Refill Coordination Note  Justin Wallace is a 78 y.o. male contacted today regarding refills of specialty medication(s) Bictegravir-Emtricitab-Tenofov [name]   Patient requested Delivery   Delivery date: 10/10/23   Verified address: [address] [place] [address]   Medication will be filled on 10/09/23.

## 2023-10-08 NOTE — Progress Notes (Signed)
 Resending [employer] script to [facility] per patient preference.  Margarite Gouge, PharmD, CPP, BCIDP, AAHIVP Clinical Pharmacist Practitioner Infectious Diseases Clinical Pharmacist [facility]

## 2023-10-09 ENCOUNTER — Other Ambulatory Visit: Payer: Self-pay

## 2023-10-15 DIAGNOSIS — Z859 Personal history of malignant neoplasm, unspecified: Secondary | ICD-10-CM | POA: Diagnosis not present

## 2023-10-15 DIAGNOSIS — L570 Actinic keratosis: Secondary | ICD-10-CM | POA: Diagnosis not present

## 2023-10-15 DIAGNOSIS — Z872 Personal history of diseases of the skin and subcutaneous tissue: Secondary | ICD-10-CM | POA: Diagnosis not present

## 2023-10-15 DIAGNOSIS — L578 Other skin changes due to chronic exposure to nonionizing radiation: Secondary | ICD-10-CM | POA: Diagnosis not present

## 2023-10-24 ENCOUNTER — Other Ambulatory Visit: Payer: Self-pay

## 2023-10-28 ENCOUNTER — Other Ambulatory Visit (HOSPITAL_COMMUNITY): Payer: Self-pay

## 2023-10-31 ENCOUNTER — Other Ambulatory Visit (HOSPITAL_COMMUNITY): Payer: Self-pay

## 2023-10-31 ENCOUNTER — Other Ambulatory Visit: Payer: Self-pay

## 2023-10-31 NOTE — Progress Notes (Signed)
 Specialty Pharmacy Refill Coordination Note  Justin Wallace is a 78 y.o. male contacted today regarding refills of specialty medication(s) Bictegravir-Emtricitab-Tenofov [name]   Patient requested Delivery   Delivery date: 11/05/23   Verified address: [address]   [place] [address]   Medication will be filled on 11/04/23.

## 2023-11-04 ENCOUNTER — Other Ambulatory Visit: Payer: Self-pay

## 2023-11-27 ENCOUNTER — Other Ambulatory Visit (HOSPITAL_COMMUNITY): Payer: Self-pay

## 2023-11-27 ENCOUNTER — Other Ambulatory Visit (HOSPITAL_COMMUNITY): Payer: Self-pay | Admitting: Pharmacy Technician

## 2023-11-27 NOTE — Progress Notes (Signed)
 Specialty Pharmacy Refill Coordination Note  Justin Wallace is a 78 y.o. male contacted today regarding refills of specialty medication(s) [name]   Patient requested Delivery   Delivery date: 11/29/23   Verified address: [address] [name] [address]   Medication will be filled on 11/28/23.

## 2023-12-11 NOTE — Progress Notes (Signed)
 The 10-year ASCVD risk score (Arnett DK, et al., 2019) is: 22.7%   Values used to calculate the score:     Age: 78 years     Sex: Male     Is Non-Hispanic African American: No     Diabetic: No     Tobacco smoker: No     Systolic Blood Pressure: 119 mmHg     Is BP treated: No     HDL Cholesterol: 35 mg/dL     Total Cholesterol: 151 mg/dL  Arlon Bergamo, BSN, RN

## 2024-01-01 ENCOUNTER — Other Ambulatory Visit: Payer: Self-pay

## 2024-01-03 ENCOUNTER — Other Ambulatory Visit: Payer: Self-pay

## 2024-01-06 ENCOUNTER — Other Ambulatory Visit: Payer: Self-pay

## 2024-01-10 ENCOUNTER — Other Ambulatory Visit: Payer: Self-pay

## 2024-01-10 NOTE — Progress Notes (Signed)
 Specialty Pharmacy Refill Coordination Note  Justin Wallace is a 78 y.o. male contacted today regarding refills of specialty medication(s) [name]   Patient requested Delivery   Delivery date: 01/14/24   Verified address: [address] [name]. [address]   Medication will be filled on 06.16.25.

## 2024-02-06 ENCOUNTER — Other Ambulatory Visit: Payer: Self-pay | Admitting: Pharmacy Technician

## 2024-02-06 ENCOUNTER — Other Ambulatory Visit: Payer: Self-pay

## 2024-02-06 NOTE — Progress Notes (Signed)
 Specialty Pharmacy Refill Coordination Note  Justin Wallace is a 78 y.o. male contacted today regarding refills of specialty medication(s) [name]   Patient requested Delivery   Delivery date: 02/18/24   Verified address: [address]   [name] [address]   Medication will be filled on 02/17/24.

## 2024-02-17 ENCOUNTER — Other Ambulatory Visit (HOSPITAL_COMMUNITY): Payer: Self-pay

## 2024-02-17 ENCOUNTER — Other Ambulatory Visit: Payer: Self-pay

## 2024-03-16 ENCOUNTER — Other Ambulatory Visit: Payer: Self-pay

## 2024-03-16 NOTE — Progress Notes (Signed)
 Specialty Pharmacy Refill Coordination Note  Bobbi Kozakiewicz is a 78 y.o. male contacted today regarding refills of specialty medication(s) [name]   Patient requested Delivery   Delivery date: 03/19/24   Verified address: [address]   [name] [address]   Medication will be filled on 03/18/24.

## 2024-03-16 NOTE — Progress Notes (Signed)
 Specialty Pharmacy Ongoing Clinical Assessment Note  Justin Wallace is a 78 y.o. male who is being followed by the specialty pharmacy service for RxSp HIV   Patient's specialty medication(s) reviewed today: [name]   Missed doses in the last 4 weeks: 0   Patient/Caregiver did not have any additional questions or concerns.   Therapeutic benefit summary: Patient is achieving benefit   Adverse events/side effects summary: No adverse events/side effects   Patient's therapy is appropriate to: Continue    Goals Addressed             This Visit's Progress    Achieve Undetectable HIV Viral Load < 20   On track    Patient is on track. Patient will maintain adherence.  Patient's viral load remains undetectable as of 09/03/23.          Follow up: 6 months  [name] Dolly Specialty Pharmacist

## 2024-03-17 ENCOUNTER — Other Ambulatory Visit: Payer: Self-pay

## 2024-03-19 ENCOUNTER — Other Ambulatory Visit: Payer: Self-pay

## 2024-04-13 ENCOUNTER — Other Ambulatory Visit: Payer: Self-pay

## 2024-04-13 NOTE — Progress Notes (Signed)
 Specialty Pharmacy Refill Coordination Note  Justin Wallace is a 78 y.o. male contacted today regarding refills of specialty medication(s) [name]   Patient requested Delivery   Delivery date: 04/22/24   Verified address: [address]   [name] [address]   Medication will be filled on 04/21/24.

## 2024-04-20 ENCOUNTER — Ambulatory Visit: Payer: Self-pay

## 2024-04-20 NOTE — Telephone Encounter (Signed)
 FYI Only or Action Required?: FYI only for provider.  Patient was last seen in primary care on N/A, new patient.  Called Nurse Triage reporting Rectal Pain.  Symptoms began several years ago.  Interventions attempted: OTC medications: Destin, Preparation H.  Symptoms are: constipation (strains to have BM, 1 BM every few days. Last BM today); sore/irritated skin at rectum gradually worsening.  Triage Disposition: See PCP Within 2 Weeks- patient scheduled for new patient appointment 05/01/24  Patient/caregiver understands and will follow disposition?: Yes                   Message from [facility] P sent at 04/20/2024 10:13 AM EDT  Pt called saying he Has a sore spot around his anal area and wanted to schedule an appt at [facility]  there were no appts this week.   Reason for Disposition  Mild rectal pain  Constipation is a chronic symptom (recurrent or ongoing AND present > 4 weeks)  Answer Assessment - Initial Assessment Questions 1. SYMPTOM:  What's the main symptom you're concerned about? (e.g., pain, itching, swelling, rash)     Pain, sensitive spot (describes it like raw skin/macerated at times)  2. ONSET: When did the pain/sore spot  start?     X 1-1.5 years.  3. RECTAL PAIN: Do you have any pain around your rectum? How bad is the pain?  (Scale 0-10; or none, mild, moderate, severe)     Yes; varies depending on how bad it is or if it flares up.  4. RECTAL ITCHING: Do you have any itching in this area? How bad is the itching?  (Scale 0-10; or none, mild, moderate, severe)     No.  5. CONSTIPATION: Do you have constipation? If Yes, ask: How often do you have a bowel movement (BM)?  (Normal range: 3 times a day to every 3 days)  When was your last BM?       Yes. Once every 2-3 days. Last BM was today. He states he feels like he strains. He states he has had constipation for months.  6. CAUSE: What do you think is causing the anus symptoms?     He  states it started when he was having diarrhea from cancer treatments. He states he has to wash and clean himself well and keep the area dry. He states moisture irritates it.  7. OTHER SYMPTOMS: Do you have any other symptoms?  (e.g., abdomen pain, fever, rectal bleeding, vomiting)     Denies rectal bleeding, open/deep wound, abdominal pain, fever, vomiting.  8. PREGNANCY: Is there any chance you are pregnant? When was your last menstrual period?     N/A.  Protocols used: Rectal Symptoms-A-AH, Constipation-A-AH

## 2024-04-21 ENCOUNTER — Other Ambulatory Visit: Payer: Self-pay

## 2024-05-01 ENCOUNTER — Ambulatory Visit (INDEPENDENT_AMBULATORY_CARE_PROVIDER_SITE_OTHER)

## 2024-05-01 VITALS — BP 108/60 | HR 66 | Ht 69.0 in | Wt 181.4 lb

## 2024-05-01 DIAGNOSIS — E782 Mixed hyperlipidemia: Secondary | ICD-10-CM | POA: Diagnosis not present

## 2024-05-01 DIAGNOSIS — G620 Drug-induced polyneuropathy: Secondary | ICD-10-CM

## 2024-05-01 DIAGNOSIS — Z125 Encounter for screening for malignant neoplasm of prostate: Secondary | ICD-10-CM | POA: Diagnosis not present

## 2024-05-01 DIAGNOSIS — K900 Celiac disease: Secondary | ICD-10-CM

## 2024-05-01 DIAGNOSIS — B20 Human immunodeficiency virus [HIV] disease: Secondary | ICD-10-CM | POA: Diagnosis not present

## 2024-05-01 DIAGNOSIS — T451X5A Adverse effect of antineoplastic and immunosuppressive drugs, initial encounter: Secondary | ICD-10-CM

## 2024-05-01 DIAGNOSIS — C8510 Unspecified B-cell lymphoma, unspecified site: Secondary | ICD-10-CM

## 2024-05-01 DIAGNOSIS — E46 Unspecified protein-calorie malnutrition: Secondary | ICD-10-CM

## 2024-05-01 DIAGNOSIS — L0591 Pilonidal cyst without abscess: Secondary | ICD-10-CM | POA: Diagnosis not present

## 2024-05-01 NOTE — Progress Notes (Signed)
 New Patient Visit   Physician: Kaye Mitro A Griselle Rufer, MD  Patient: Justin Wallace   DOB: [birth year]   78 y.o. Male  MRN: [id number] Visit Date: 05/01/2024   Chief Complaint  Patient presents with   New Patient (Initial Visit)    Establishing Care   Subjective  Justin Wallace is a 78 y.o. male who presents today as a new patient to establish care.   HPI  Discussed the use of AI scribe software for clinical note transcription with the patient, who gave verbal consent to proceed.  History of Present Illness   Justin Wallace is a 78 year old male with HIV who presents with a persistent sore near his buttock.  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 - Refer to general surgery for evaluation and potential drainage. - Discussed surgical intervention to prevent recurrence.  HIV infection HIV well-managed on Biktarvy  with good adherence. Annual follow-ups due to stable condition.  Peripheral neuropathy secondary to chemotherapy Peripheral neuropathy in feet managed effectively with chiropractic treatments.  Non-Hodgkin's lymphoma, in remission Non-Hodgkin's lymphoma in remission, no current lymph node issues.  Celiac disease Celiac disease managed with gluten-free diet, no current issues.  Weight needs ongoing monitoring  General Health Maintenance Declined flu vaccination. Discussed weight maintenance to prevent unintended loss during illness. - Schedule follow-up in 3-4 weeks to review lab results. - Order comprehensive blood work including kidney function, blood counts, and cholesterol before next visit.  Objective  BP 108/60   Pulse 66   Ht 5' 9 (1.753 m)   Wt 181 lb 6.4 oz (82.3 kg)   SpO2 96%   BMI 26.79 kg/m      Review of Systems  Constitutional:  Negative for chills, fever and weight loss.  Eyes:  Negative for blurred vision. h Respiratory:  Negative for cough and shortness of breath.   Cardiovascular:   Negative for chest pain and palpitations.  Skin:  Negative for rash.  Psychiatric/Behavioral:  Negative for depression. The patient is not nervous/anxious.      Physical Exam Physical Exam Vitals reviewed.  Constitutional:      Appearance: Normal appearance. Well-developed with normal weight.  HENT:     Head: Normocephalic and atraumatic.  Normal mucous membranes, no oral lesions Eyes:     Pupils: Pupils are equal, round, and reactive to light.  Neck:     Thyroid : No thyroid  mass or thyromegaly.  Cardiovascular:     Rate and Rhythm: Normal rate and regular rhythm. Normal heart sounds. Normal peripheral pulses Pulmonary:     Normal breath sounds with normal effort Abdominal:   Abdomen is soft, without tenderness or noted hepatosplenomegaly Musculoskeletal:        General: No swelling or edema  Lymphadenopathy:     Cervical: No cervical adenopathy.  Skin:    General: Skin is warm and dry without noticeable rash. Mid buttock 2x 3 cm raised firm abscess Neurological:     General: No focal deficit present.  Psychiatric:        Mood and Affect: Mood, behavior and cognition normal   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   HIV (human immunodeficiency virus infection) (HCC)    Lyme disease    Lymphoma (HCC)    Past Surgical History:  Procedure Laterality Date   ESOPHAGOGASTRODUODENOSCOPY (EGD) WITH PROPOFOL  N/A 10/03/2021   Procedure: ESOPHAGOGASTRODUODENOSCOPY (EGD) WITH PROPOFOL ;  Surgeon: Therisa Bi, MD;  Location: [facility] ENDOSCOPY;  Service: Gastroenterology;  Laterality: N/A;   PORTA CATH INSERTION N/A 04/25/2020   Procedure: PORTA CATH INSERTION;  Surgeon: Marea Selinda RAMAN, MD;  Location: [facility] INVASIVE CV LAB;  Service: Cardiovascular;  Laterality: N/A;   Family Status  Relation Name Status   Mother  Alive   Father  Deceased  No partnership data on file   Family History  Problem  Relation Age of Onset   Hyperlipidemia Mother    Brain cancer Father    Social History   Socioeconomic History   Marital status: Married    Spouse name: Justin Wallace    Number of children: 2   Years of education: Not on file   Highest education level: Doctorate  Occupational History   Not on file  Tobacco Use   Smoking status: Never   Smokeless tobacco: Never  Vaping Use   Vaping status: Never Used  Substance and Sexual Activity   Alcohol use: Yes    Comment: occasional   Drug use: Never   Sexual activity: Never  Other Topics Concern   Not on file  Social History Narrative   Not on file   Social Drivers of Health   Financial Resource Strain: Low Risk  (05/01/2024)   Overall Financial Resource Strain (CARDIA)    Difficulty of Paying Living Expenses: Not hard at all  Food Insecurity: No Food Insecurity (05/01/2024)   Hunger Vital Sign  Worried About Programme researcher, broadcasting/film/video in the Last Year: Never true    Ran Out of Food in the Last Year: Never true  Transportation Needs: No Transportation Needs (05/01/2024)   PRAPARE - Administrator, Civil Service (Medical): No    Lack of Transportation (Non-Medical): No  Physical Activity: Sufficiently Active (05/01/2024)   Exercise Vital Sign    Days of Exercise per Week: 6 days    Minutes of Exercise per Session: 60 min  Stress: No Stress Concern Present (05/01/2024)   [employer] of Occupational Health - Occupational Stress Questionnaire    Feeling of Stress: Only a little  Social Connections: Socially Integrated (05/01/2024)   Social Connection and Isolation Panel    Frequency of Communication with Friends and Family: Three times a week    Frequency of Social Gatherings with Friends and Family: Once a week    Attends Religious Services: More than 4 times per year    Active Member of Clubs or Organizations: Yes    Attends Engineer, structural: More than 4 times per year    Marital Status: Married   Outpatient  Medications Prior to Visit  Medication Sig   bictegravir-emtricitabine -tenofovir  AF (BIKTARVY ) 50-200-25 MG TABS tablet TAKE 1 TABLET BY MOUTH DAILY.   [DISCONTINUED] gabapentin  (NEURONTIN ) 100 MG capsule Take 2 capsules (200 mg total) by mouth daily as needed.   [DISCONTINUED] Multiple Vitamin (MULTIVITAMIN ADULT PO) Take 1 tablet by mouth daily. (Patient not taking: Reported on 09/03/2023)   No facility-administered medications prior to visit.   No Known Allergies  Immunization History  Administered Date(s) Administered   Fluad Quad(high Dose 65+) 04/19/2016, 05/27/2020   Hepb-cpg 08/11/2021, 09/08/2021   INFLUENZA, HIGH DOSE SEASONAL PF 04/19/2016   Meningococcal polysaccharide vaccine (MPSV4) 05/04/2003   PFIZER(Purple Top)SARS-COV-2 Vaccination 09/21/2019, 10/12/2019   Pneumococcal Conjugate-13 03/17/2015   Pneumococcal Polysaccharide-23 08/14/2017   Td 07/15/2003   Typhoid Inactivated 05/04/2003   Yellow Fever 05/04/2003    Health Maintenance  Topic Date Due   Zoster Vaccines- Shingrix (1 of 2) Never done   DTaP/Tdap/Td (2 - Tdap) 07/14/2013   Medicare Annual Wellness (AWV)  08/14/2018   COVID-19 Vaccine (3 - Pfizer risk series) 11/09/2019   Colonoscopy  08/16/2023   Influenza Vaccine  02/28/2024   Pneumococcal Vaccine: 50+ Years  Completed   Hepatitis C Screening  Completed   HPV VACCINES  Aged Out   Meningococcal B Vaccine  Aged Out   Hepatitis B Vaccines 19-59 Average Risk  Discontinued    Patient Care Team: Everlene Parris LABOR, MD as PCP - General (Family Medicine) Babara Call, MD as Consulting Physician (Hematology and Oncology)  Depression Screen    05/01/2024    3:32 PM 09/03/2023    9:59 AM 08/22/2022    8:45 AM 02/22/2022    9:32 AM  PHQ 2/9 Scores  PHQ - 2 Score 0 0 0 0  PHQ- 9 Score 0        Parris LABOR Everlene, MD  [facility] [phone] (phone) [phone] (fax)  [facility]

## 2024-05-14 ENCOUNTER — Other Ambulatory Visit (HOSPITAL_COMMUNITY): Payer: Self-pay

## 2024-05-18 ENCOUNTER — Other Ambulatory Visit (HOSPITAL_COMMUNITY): Payer: Self-pay

## 2024-05-18 ENCOUNTER — Encounter: Payer: Self-pay | Admitting: Surgery

## 2024-05-18 ENCOUNTER — Ambulatory Visit: Admitting: Surgery

## 2024-05-18 VITALS — BP 113/71 | HR 66 | Temp 98.0°F | Ht 69.0 in | Wt 178.0 lb

## 2024-05-18 DIAGNOSIS — B20 Human immunodeficiency virus [HIV] disease: Secondary | ICD-10-CM | POA: Diagnosis not present

## 2024-05-18 DIAGNOSIS — A630 Anogenital (venereal) warts: Secondary | ICD-10-CM

## 2024-05-18 DIAGNOSIS — K6289 Other specified diseases of anus and rectum: Secondary | ICD-10-CM

## 2024-05-18 NOTE — Progress Notes (Unsigned)
 05/18/2024  Reason for Referral:  Pilonidal cyst  Requesting Provider:  Parris Juneau, MD  History of Present Illness: Justin Wallace is a 78 y.o. male presenting for evaluation of a possible pilonidal cyst versus recurrent cyst.  The patient reports that he has had this area in the perianal region for about a year that resulted in intermittent pain and drainage.  Reports there are times where it hurts more and other times where it is easier.  He also reports having hemorrhoid issues since he had his chemotherapy for lymphoma reports sometimes these areas also have some tenderness.  Denies any bleeding or purulent drainage.  One of his main issues is that due to some increased moisture in the area, he finds it harder to stay clean.  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   HIV (human immunodeficiency virus infection) (HCC)    Lyme disease    Lymphoma (HCC)      Past Surgical History: Past Surgical History:  Procedure Laterality Date   ESOPHAGOGASTRODUODENOSCOPY (EGD) WITH PROPOFOL  N/A 10/03/2021   Procedure: ESOPHAGOGASTRODUODENOSCOPY (EGD) WITH PROPOFOL ;  Surgeon: Therisa Bi, MD;  Location: [facility] ENDOSCOPY;  Service: Gastroenterology;  Laterality: N/A;   PORTA CATH INSERTION N/A 04/25/2020   Procedure: PORTA CATH INSERTION;  Surgeon: Marea Selinda RAMAN, MD;  Location: [facility] INVASIVE CV LAB;  Service: Cardiovascular;  Laterality: N/A;    Home Medications: Prior to Admission medications   Medication Sig Start Date End Date Taking? Authorizing Provider  bictegravir-emtricitabine -tenofovir  AF (BIKTARVY ) 50-200-25 MG TABS tablet TAKE 1 TABLET BY MOUTH DAILY. 10/08/23   Waddell Alan PARAS, RPH-CPP    Allergies: No Known Allergies  Social History:  reports that he has never smoked. He has never used smokeless tobacco. He reports current alcohol use. He reports that he does not use  drugs.   Family History: Family History  Problem Relation Age of Onset   Hyperlipidemia Mother    Brain cancer Father     Review of Systems: Review of Systems  Constitutional:  Negative for chills and fever.  Respiratory:  Negative for shortness of breath.   Cardiovascular:  Negative for chest pain.  Gastrointestinal:  Negative for constipation, nausea and vomiting.       Perianal pain    Physical Exam BP 113/71   Pulse 66   Temp 98 F (36.7 C) (Oral)   Ht 5' 9 (1.753 m)   Wt 178 lb (80.7 kg)   SpO2 98%   BMI 26.29 kg/m  CONSTITUTIONAL: No acute distress HEENT:  Normocephalic, atraumatic, extraocular motion intact. RESPIRATORY:  Normal respiratory effort without pathologic use of accessory muscles. CARDIOVASCULAR: Regular rhythm and rate. RECTAL:  External exam reveals mildly enlarged external hemorrhoids in posterior aspect.  These are not inflamed and are non-tender.  He also has an area of about 2 x 1 cm in the right medial buttocks with a relatively flat condyloma, which is tender to palpation due to some skin excoriation.  He also has a smaller condyloma in the left medial buttocks of about 5 mm.  He also has a small perianal cyst in the medial left buttocks that is about 1 cm in size, which is not tender, but unclear if this is a site of a fistula or not.  There is no erythema or drainage. MUSCULOSKELETAL:  Normal muscle strength and tone in all four extremities.  No peripheral edema  or cyanosis. NEUROLOGIC:  Motor and sensation is grossly normal.  Cranial nerves are grossly intact. PSYCH:  Alert and oriented to person, place and time. Affect is normal.  Laboratory Analysis: No results found for this or any previous visit (from the past 24 hours).  Imaging: No results found.  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  -- Discussed with the patient the findings on exam today showing perianal condylomas.  There is no evidence of any  pilonidal cyst.  He does have a small perianal cyst in the medial left buttocks which is currently nontender.  However the area that he is tender at is the larger of the perianal condylomas on the right medial buttocks.  Discussed with patient that this could be potentially related to his HIV diagnosis or potentially related to chemotherapy as both of these can affect his immune system.  Discussed with him that based on these findings, it would be also more beneficial to refer the patient to a true colorectal surgeon at this may require more close monitoring and follow-up and potentially an anoscopy to make sure there are no other warts within the anal canal.  He is in agreement with this. - Will send referral to colorectal surgery with [facility] for further management. - Follow-up as needed.  I spent 30 minutes dedicated to the care of this patient on the date of this encounter to include pre-visit review of records, face-to-face time with the patient discussing diagnosis and management, and any post-visit coordination of care.   Aloysius Sheree Plant, MD [facility]

## 2024-05-18 NOTE — Patient Instructions (Signed)
 We will refer you a colorectal surgeon, they will call you for an appointment   Warts  Warts are small growths on the skin. They are common and can occur on many areas of the body. A person may have one wart or several warts. In many cases, warts do not need treatment. They usually go away on their own over a period of many months to a few years. If needed, warts that cause problems or do not go away on their own can be treated. What are the causes? Warts are caused by a type of virus called human papillomavirus (HPV). HPV can spread from person to person through direct contact. Warts can also spread to other areas of the body when a person scratches a wart and then scratches another area of his or her body. What increases the risk? You are more likely to develop this condition if: You are 58-64 years old. You have a weakened body defense system (immune system). You are Caucasian. What are the signs or symptoms? The main symptom of this condition is small growths on the skin. Warts may: Be round or oval or have an irregular shape. Have a rough surface. Range in color from skin color to light yellow, brown, or gray. Generally be less than  inch (1.3 cm) in size. Go away and then come back again. Most warts are painless, but some can be painful if they are large or occur in an area of the body where pressure is applied to them, such as the bottom of the foot. How is this diagnosed? A wart can usually be diagnosed based on its appearance. In some cases, a tissue sample may be removed (biopsy) to be looked at under a microscope. How is this treated? In many cases, warts do not need treatment. Sometimes treatment is wanted. If treatment is needed or wanted, options may include: Applying medicated solutions, creams, or patches to the wart. These may be over-the-counter or prescription medicines that make the skin soft so that layers will gradually shed away. In many cases, the medicine is  applied one or two times per day and covered with a bandage. Putting duct tape over the top of the wart (occlusion). You will leave the tape in place for as long as told by your health care provider and then replace it with a new strip of tape. This is done until the wart goes away. Freezing the wart with liquid nitrogen (cryotherapy). Burning the wart with: Laser treatment. An electrified probe (electrocautery). Injection of a medicine into the wart to help the body's immune system fight off the wart. Surgery to remove the wart. Follow these instructions at home: Medicines Use over-the-counter and prescription medicines only as told by your health care provider. Do not use over-the-counter wart medicines on your face or genitals unless your health care provider tells you to do that. Lifestyle Keep your immune system healthy. To do this: Eat a healthy, balanced diet. Get enough sleep. Do not use any products that contain nicotine or tobacco. These products include cigarettes, chewing tobacco, and vaping devices, such as e-cigarettes. If you need help quitting, ask your health care provider. General instructions  Wash your hands after you touch a wart. Do not scratch or pick at a wart. Avoid shaving hair that is over a wart. Keep all follow-up visits. This is important. Contact a health care provider if: Your warts do not improve after treatment. You have redness, swelling, or pain at the site of a  wart. You have bleeding from a wart that does not stop with light pressure. You have diabetes and you develop a wart. Summary Warts are small growths on the skin. They are common and can occur on many areas of the body. In many cases, warts do not need treatment. Sometimes treatment is wanted. If treatment is needed or wanted, there are several treatment options. Apply over-the-counter and prescription medicines only as told by your health care provider. Wash your hands after you touch a  wart. Keep all follow-up visits. This is important. This information is not intended to replace advice given to you by your health care provider. Make sure you discuss any questions you have with your health care provider. Document Revised: 08/18/2021 Document Reviewed: 08/18/2021 Elsevier Patient Education  2024 [employer].

## 2024-05-20 ENCOUNTER — Other Ambulatory Visit (HOSPITAL_COMMUNITY): Payer: Self-pay

## 2024-05-21 ENCOUNTER — Other Ambulatory Visit: Payer: Self-pay

## 2024-05-21 NOTE — Progress Notes (Signed)
 Specialty Pharmacy Refill Coordination Note  Marshel Golubski is a 78 y.o. male contacted today regarding refills of specialty medication(s) [name]   Patient requested Delivery   Delivery date: 05/28/24   Verified address: [address]   [name] [address]   Medication will be filled on 05/27/24.

## 2024-05-27 ENCOUNTER — Other Ambulatory Visit: Payer: Self-pay

## 2024-05-27 LAB — TSH+FREE T4: TSH W/REFLEX TO FT4: 3.87 m[IU]/L (ref 0.40–4.50)

## 2024-05-27 LAB — URINALYSIS, ROUTINE W REFLEX MICROSCOPIC
Bilirubin Urine: NEGATIVE
Glucose, UA: NEGATIVE
Hgb urine dipstick: NEGATIVE
Ketones, ur: NEGATIVE
Leukocytes,Ua: NEGATIVE
Nitrite: NEGATIVE
Protein, ur: NEGATIVE
Specific Gravity, Urine: 1.023 (ref 1.001–1.035)
pH: 6.5 (ref 5.0–8.0)

## 2024-05-27 LAB — LIPID PANEL
Cholesterol: 170 mg/dL (ref <200)
HDL: 36 mg/dL — ABNORMAL LOW (ref >=40)
LDL Cholesterol (Calc): 114 mg/dL — ABNORMAL HIGH
Non-HDL Cholesterol (Calc): 134 mg/dL — ABNORMAL HIGH (ref <130)
Total CHOL/HDL Ratio: 4.7 (calc) (ref <5.0)
Triglycerides: 96 mg/dL (ref <150)

## 2024-05-27 LAB — CBC WITH DIFFERENTIAL/PLATELET
Absolute Lymphocytes: 1544 {cells}/uL (ref 850–3900)
Absolute Monocytes: 541 {cells}/uL (ref 200–950)
Basophils Absolute: 52 {cells}/uL (ref 0–200)
Basophils Relative: 1 %
Eosinophils Absolute: 151 {cells}/uL (ref 15–500)
Eosinophils Relative: 2.9 %
HCT: 41.4 % (ref 38.5–50.0)
Hemoglobin: 13.9 g/dL (ref 13.2–17.1)
MCH: 32.1 pg (ref 27.0–33.0)
MCHC: 33.6 g/dL (ref 32.0–36.0)
MCV: 95.6 fL (ref 80.0–100.0)
MPV: 10.1 fL (ref 7.5–12.5)
Monocytes Relative: 10.4 %
Neutro Abs: 2912 {cells}/uL (ref 1500–7800)
Neutrophils Relative %: 56 %
Platelets: 189 Thousand/uL (ref 140–400)
RBC: 4.33 Million/uL (ref 4.20–5.80)
RDW: 13 % (ref 11.0–15.0)
Total Lymphocyte: 29.7 %
WBC: 5.2 Thousand/uL (ref 3.8–10.8)

## 2024-05-27 LAB — COMPREHENSIVE METABOLIC PANEL WITH GFR
AG Ratio: 1.5 (calc) (ref 1.0–2.5)
ALT: 8 U/L — ABNORMAL LOW (ref 9–46)
AST: 13 U/L (ref 10–35)
Albumin: 4.3 g/dL (ref 3.6–5.1)
Alkaline phosphatase (APISO): 89 U/L (ref 35–144)
BUN/Creatinine Ratio: 20 (calc) (ref 6–22)
BUN: 26 mg/dL — ABNORMAL HIGH (ref 7–25)
CO2: 25 mmol/L (ref 20–32)
Calcium: 9.5 mg/dL (ref 8.6–10.3)
Chloride: 106 mmol/L (ref 98–110)
Creat: 1.29 mg/dL — ABNORMAL HIGH (ref 0.70–1.28)
Globulin: 2.9 g/dL (ref 1.9–3.7)
Glucose, Bld: 88 mg/dL (ref 65–99)
Potassium: 4.3 mmol/L (ref 3.5–5.3)
Sodium: 140 mmol/L (ref 135–146)
Total Bilirubin: 0.6 mg/dL (ref 0.2–1.2)
Total Protein: 7.2 g/dL (ref 6.1–8.1)
eGFR: 58 mL/min/1.73m2 — ABNORMAL LOW (ref >=60)

## 2024-05-27 LAB — HEMOGLOBIN A1C
Hgb A1c MFr Bld: 5.4 % (ref <5.7)
Mean Plasma Glucose: 108 mg/dL
eAG (mmol/L): 6 mmol/L

## 2024-05-27 LAB — PSA: PSA: 1.77 ng/mL (ref <=4.00)

## 2024-05-29 ENCOUNTER — Ambulatory Visit

## 2024-05-29 DIAGNOSIS — E46 Unspecified protein-calorie malnutrition: Secondary | ICD-10-CM

## 2024-05-29 DIAGNOSIS — Z125 Encounter for screening for malignant neoplasm of prostate: Secondary | ICD-10-CM

## 2024-05-29 DIAGNOSIS — B20 Human immunodeficiency virus [HIV] disease: Secondary | ICD-10-CM

## 2024-06-18 ENCOUNTER — Other Ambulatory Visit (HOSPITAL_COMMUNITY): Payer: Self-pay

## 2024-06-19 ENCOUNTER — Other Ambulatory Visit (HOSPITAL_COMMUNITY): Payer: Self-pay

## 2024-06-23 ENCOUNTER — Other Ambulatory Visit: Payer: Self-pay

## 2024-06-24 ENCOUNTER — Other Ambulatory Visit: Payer: Self-pay

## 2024-06-24 ENCOUNTER — Other Ambulatory Visit (HOSPITAL_COMMUNITY): Payer: Self-pay

## 2024-06-24 NOTE — Progress Notes (Signed)
 Specialty Pharmacy Refill Coordination Note  Justin Wallace is a 78 y.o. male contacted today regarding refills of specialty medication(s) [name]   Patient requested Delivery   Delivery date: 07/01/24   Verified address: [address]   [name] [address]   Medication will be filled on: 06/30/24

## 2024-06-30 ENCOUNTER — Other Ambulatory Visit: Payer: Self-pay

## 2024-07-22 ENCOUNTER — Other Ambulatory Visit: Payer: Self-pay

## 2024-07-27 ENCOUNTER — Other Ambulatory Visit (HOSPITAL_COMMUNITY): Payer: Self-pay

## 2024-07-29 ENCOUNTER — Other Ambulatory Visit: Payer: Self-pay

## 2024-07-31 ENCOUNTER — Other Ambulatory Visit: Payer: Self-pay

## 2024-08-03 ENCOUNTER — Other Ambulatory Visit (HOSPITAL_COMMUNITY): Payer: Self-pay

## 2024-08-03 NOTE — Progress Notes (Addendum)
 Specialty Pharmacy Refill Coordination Note  Justin Wallace is a 79 y.o. male contacted today regarding refills of specialty medication(s) [name]   Patient requested Delivery   Delivery date: 08/05/24   Verified address: [address]   [name] [address]   Medication will be filled on: 08/04/24   Send bill for $5

## 2024-08-04 ENCOUNTER — Other Ambulatory Visit: Payer: Self-pay

## 2024-08-13 ENCOUNTER — Other Ambulatory Visit: Payer: Self-pay

## 2024-08-13 ENCOUNTER — Encounter (HOSPITAL_BASED_OUTPATIENT_CLINIC_OR_DEPARTMENT_OTHER): Payer: Self-pay | Admitting: Surgery

## 2024-08-19 ENCOUNTER — Ambulatory Visit: Payer: Self-pay | Admitting: Surgery

## 2024-08-19 DIAGNOSIS — Z01818 Encounter for other preprocedural examination: Secondary | ICD-10-CM

## 2024-08-19 DIAGNOSIS — Z01810 Encounter for preprocedural cardiovascular examination: Secondary | ICD-10-CM

## 2024-08-21 ENCOUNTER — Ambulatory Visit (HOSPITAL_BASED_OUTPATIENT_CLINIC_OR_DEPARTMENT_OTHER)
Admission: RE | Admit: 2024-08-21 | Discharge: 2024-08-21 | Disposition: A | Discharge status: Home / Self Care | Attending: Surgery | Admitting: Surgery

## 2024-08-21 ENCOUNTER — Ambulatory Visit (HOSPITAL_BASED_OUTPATIENT_CLINIC_OR_DEPARTMENT_OTHER): Payer: Self-pay | Admitting: Anesthesiology

## 2024-08-21 ENCOUNTER — Encounter (HOSPITAL_BASED_OUTPATIENT_CLINIC_OR_DEPARTMENT_OTHER)
Admission: RE | Disposition: A | Discharge status: Home / Self Care | Payer: Self-pay | Source: Home / Self Care | Attending: Surgery

## 2024-08-21 ENCOUNTER — Encounter (HOSPITAL_BASED_OUTPATIENT_CLINIC_OR_DEPARTMENT_OTHER): Payer: Self-pay | Admitting: Surgery

## 2024-08-21 ENCOUNTER — Other Ambulatory Visit: Payer: Self-pay

## 2024-08-21 DIAGNOSIS — K6289 Other specified diseases of anus and rectum: Secondary | ICD-10-CM | POA: Diagnosis present

## 2024-08-21 DIAGNOSIS — Z21 Asymptomatic human immunodeficiency virus [HIV] infection status: Secondary | ICD-10-CM | POA: Diagnosis not present

## 2024-08-21 DIAGNOSIS — Z01818 Encounter for other preprocedural examination: Secondary | ICD-10-CM

## 2024-08-21 DIAGNOSIS — D013 Carcinoma in situ of anus and anal canal: Secondary | ICD-10-CM | POA: Insufficient documentation

## 2024-08-21 DIAGNOSIS — K629 Disease of anus and rectum, unspecified: Secondary | ICD-10-CM | POA: Diagnosis not present

## 2024-08-21 DIAGNOSIS — A630 Anogenital (venereal) warts: Secondary | ICD-10-CM | POA: Diagnosis present

## 2024-08-21 LAB — CBC WITH DIFFERENTIAL/PLATELET
Abs Immature Granulocytes: 0.01 K/uL (ref 0.00–0.07)
Basophils Absolute: 0.1 K/uL (ref 0.0–0.1)
Basophils Relative: 1 %
Eosinophils Absolute: 0.1 K/uL (ref 0.0–0.5)
Eosinophils Relative: 3 %
HCT: 41.6 % (ref 39.0–52.0)
Hemoglobin: 14.5 g/dL (ref 13.0–17.0)
Immature Granulocytes: 0 %
Lymphocytes Relative: 29 %
Lymphs Abs: 1.4 K/uL (ref 0.7–4.0)
MCH: 32.2 pg (ref 26.0–34.0)
MCHC: 34.9 g/dL (ref 30.0–36.0)
MCV: 92.4 fL (ref 80.0–100.0)
Monocytes Absolute: 0.6 K/uL (ref 0.1–1.0)
Monocytes Relative: 11 %
Neutro Abs: 2.8 K/uL (ref 1.7–7.7)
Neutrophils Relative %: 56 %
Platelets: 172 K/uL (ref 150–400)
RBC: 4.5 MIL/uL (ref 4.22–5.81)
RDW: 13.3 % (ref 11.5–15.5)
WBC: 4.9 K/uL (ref 4.0–10.5)
nRBC: 0 % (ref 0.0–0.2)

## 2024-08-21 LAB — BASIC METABOLIC PANEL WITH GFR
Anion gap: 10 (ref 5–15)
BUN: 24 mg/dL — ABNORMAL HIGH (ref 8–23)
CO2: 24 mmol/L (ref 22–32)
Calcium: 9.3 mg/dL (ref 8.9–10.3)
Chloride: 108 mmol/L (ref 98–111)
Creatinine, Ser: 1.09 mg/dL (ref 0.61–1.24)
GFR, Estimated: >60 mL/min
Glucose, Bld: 107 mg/dL — ABNORMAL HIGH (ref 70–99)
Potassium: 4.2 mmol/L (ref 3.5–5.1)
Sodium: 142 mmol/L (ref 135–145)

## 2024-08-21 MED ORDER — EPHEDRINE SULFATE (PRESSORS) 25 MG/5ML IV SOSY
PREFILLED_SYRINGE | INTRAVENOUS | Status: DC | PRN
  Administered 2024-08-21: 10 mg via INTRAVENOUS

## 2024-08-21 MED ORDER — FLEET ENEMA RE ENEM
ENEMA | RECTAL | Status: AC
  Filled 2024-08-21: qty 1

## 2024-08-21 MED ORDER — EPHEDRINE 5 MG/ML INJ
INTRAVENOUS | Status: AC
  Filled 2024-08-21: qty 5

## 2024-08-21 MED ORDER — DEXAMETHASONE SODIUM PHOSPHATE 4 MG/ML IJ SOLN
INTRAMUSCULAR | Status: DC | PRN
  Administered 2024-08-21: 5 mg via INTRAVENOUS

## 2024-08-21 MED ORDER — 0.9 % SODIUM CHLORIDE (POUR BTL) OPTIME
TOPICAL | Status: DC | PRN
  Administered 2024-08-21: 700 mL

## 2024-08-21 MED ORDER — FENTANYL CITRATE (PF) 100 MCG/2ML IJ SOLN
INTRAMUSCULAR | Status: AC
  Filled 2024-08-21: qty 2

## 2024-08-21 MED ORDER — TRAMADOL HCL 50 MG PO TABS: 50.0000 mg | ORAL_TABLET | Freq: Four times a day (QID) | ORAL | 0 refills | Status: AC | PRN

## 2024-08-21 MED ORDER — ROCURONIUM BROMIDE 100 MG/10ML IV SOLN
INTRAVENOUS | Status: DC | PRN
  Administered 2024-08-21: 50 mg via INTRAVENOUS

## 2024-08-21 MED ORDER — AMISULPRIDE (ANTIEMETIC) 5 MG/2ML IV SOLN: 10.0000 mg | Freq: Once | INTRAVENOUS | Status: DC | PRN

## 2024-08-21 MED ORDER — LACTATED RINGERS IV SOLN: INTRAVENOUS | Status: DC

## 2024-08-21 MED ORDER — FENTANYL CITRATE (PF) 100 MCG/2ML IJ SOLN: 25.0000 ug | INTRAMUSCULAR | Status: DC | PRN

## 2024-08-21 MED ORDER — CHLORHEXIDINE GLUCONATE CLOTH 2 % EX PADS: 6.0000 | MEDICATED_PAD | Freq: Once | CUTANEOUS | Status: DC

## 2024-08-21 MED ORDER — KETOROLAC TROMETHAMINE 30 MG/ML IJ SOLN
INTRAMUSCULAR | Status: DC | PRN
  Administered 2024-08-21: 30 mg via INTRAVENOUS

## 2024-08-21 MED ORDER — ACETAMINOPHEN 500 MG PO TABS
1000.0000 mg | ORAL_TABLET | ORAL | Status: AC
  Administered 2024-08-21: 1000 mg via ORAL

## 2024-08-21 MED ORDER — ONDANSETRON HCL 4 MG/2ML IJ SOLN: 4.0000 mg | Freq: Once | INTRAMUSCULAR | Status: DC | PRN

## 2024-08-21 MED ORDER — PROPOFOL 500 MG/50ML IV EMUL
INTRAVENOUS | Status: AC
  Filled 2024-08-21: qty 50

## 2024-08-21 MED ORDER — CEFAZOLIN SODIUM-DEXTROSE 2-4 GM/100ML-% IV SOLN
INTRAVENOUS | Status: AC
  Filled 2024-08-21: qty 100

## 2024-08-21 MED ORDER — LIDOCAINE 2% (20 MG/ML) 5 ML SYRINGE
INTRAMUSCULAR | Status: AC
  Filled 2024-08-21: qty 5

## 2024-08-21 MED ORDER — BUPIVACAINE-EPINEPHRINE 0.5% -1:200000 IJ SOLN
INTRAMUSCULAR | Status: DC | PRN
  Administered 2024-08-21: 20 mL

## 2024-08-21 MED ORDER — KETOROLAC TROMETHAMINE 30 MG/ML IJ SOLN
INTRAMUSCULAR | Status: AC
  Filled 2024-08-21: qty 1

## 2024-08-21 MED ORDER — BUPIVACAINE-EPINEPHRINE (PF) 0.5% -1:200000 IJ SOLN
INTRAMUSCULAR | Status: AC
  Filled 2024-08-21: qty 30

## 2024-08-21 MED ORDER — CEFAZOLIN SODIUM-DEXTROSE 2-4 GM/100ML-% IV SOLN
2.0000 g | INTRAVENOUS | Status: AC
  Administered 2024-08-21: 2 g via INTRAVENOUS

## 2024-08-21 MED ORDER — SODIUM CHLORIDE (PF) 0.9 % IJ SOLN
INTRAMUSCULAR | Status: AC
  Filled 2024-08-21: qty 50

## 2024-08-21 MED ORDER — ONDANSETRON HCL 4 MG/2ML IJ SOLN
INTRAMUSCULAR | Status: DC | PRN
  Administered 2024-08-21: 4 mg via INTRAVENOUS

## 2024-08-21 MED ORDER — SUGAMMADEX SODIUM 200 MG/2ML IV SOLN
INTRAVENOUS | Status: DC | PRN
  Administered 2024-08-21: 200 mg via INTRAVENOUS

## 2024-08-21 MED ORDER — ROCURONIUM BROMIDE 10 MG/ML (PF) SYRINGE
PREFILLED_SYRINGE | INTRAVENOUS | Status: AC
  Filled 2024-08-21: qty 10

## 2024-08-21 MED ORDER — PROPOFOL 10 MG/ML IV BOLUS
INTRAVENOUS | Status: DC | PRN
  Administered 2024-08-21: 150 mg via INTRAVENOUS

## 2024-08-21 MED ORDER — ONDANSETRON HCL 4 MG/2ML IJ SOLN
INTRAMUSCULAR | Status: AC
  Filled 2024-08-21: qty 2

## 2024-08-21 MED ORDER — KETOROLAC TROMETHAMINE 15 MG/ML IJ SOLN: 15.0000 mg | Freq: Once | INTRAMUSCULAR | Status: DC | PRN

## 2024-08-21 MED ORDER — FENTANYL CITRATE (PF) 100 MCG/2ML IJ SOLN
INTRAMUSCULAR | Status: DC | PRN
  Administered 2024-08-21: 100 ug via INTRAVENOUS

## 2024-08-21 MED ORDER — DEXAMETHASONE SOD PHOSPHATE PF 10 MG/ML IJ SOLN
INTRAMUSCULAR | Status: AC
  Filled 2024-08-21: qty 1

## 2024-08-21 MED ORDER — ACETAMINOPHEN 500 MG PO TABS
ORAL_TABLET | ORAL | Status: AC
  Filled 2024-08-21: qty 2

## 2024-08-21 NOTE — Anesthesia Preprocedure Evaluation (Addendum)
"                                    Anesthesia Evaluation  Patient identified by MRN, date of birth, ID band Patient awake    Reviewed: Allergy & Precautions, NPO status , Patient's Chart, lab work & pertinent test results  Airway Mallampati: II       Dental no notable dental hx.    Pulmonary neg pulmonary ROS   Pulmonary exam normal        Cardiovascular negative cardio ROS Normal cardiovascular exam     Neuro/Psych  Neuromuscular disease    GI/Hepatic negative GI ROS, Neg liver ROS,,,  Endo/Other    Renal/GU negative Renal ROS     Musculoskeletal negative musculoskeletal ROS (+)    Abdominal   Peds  Hematology  (+) HIV  Anesthesia Other Findings PERIANAL MASS  ANAL WARTS    Reproductive/Obstetrics                              Anesthesia Physical Anesthesia Plan  ASA: 2  Anesthesia Plan: General   Post-op Pain Management:    Induction: Intravenous  PONV Risk Score and Plan: 2 and Ondansetron , Dexamethasone  and Treatment may vary due to age or medical condition  Airway Management Planned: Oral ETT  Additional Equipment:   Intra-op Plan:   Post-operative Plan: Extubation in OR  Informed Consent: I have reviewed the patients History and Physical, chart, labs and discussed the procedure including the risks, benefits and alternatives for the proposed anesthesia with the patient or authorized representative who has indicated his/her understanding and acceptance.     Dental advisory given  Plan Discussed with: CRNA  Anesthesia Plan Comments:          Anesthesia Quick Evaluation  "

## 2024-08-21 NOTE — Anesthesia Postprocedure Evaluation (Signed)
"   Anesthesia Post Note  Patient: Justin Wallace  Procedure(s) Performed: EXCISION, LESION, ANUS (Anus) EXAM UNDER ANESTHESIA, RECTUM (Anus)     Patient location during evaluation: PACU Anesthesia Type: General Level of consciousness: awake Pain management: pain level controlled Vital Signs Assessment: post-procedure vital signs reviewed and stable Respiratory status: spontaneous breathing, nonlabored ventilation and respiratory function stable Cardiovascular status: blood pressure returned to baseline and stable Postop Assessment: no apparent nausea or vomiting Anesthetic complications: no   No notable events documented.  Last Vitals:  Vitals:   08/21/24 0900 08/21/24 0909  BP: 105/61 116/65  Pulse: (!) 54 (!) 53  Resp: 19 16  Temp:  (!) 36.2 C  SpO2: 93% 96%    Last Pain:  Vitals:   08/21/24 0909  TempSrc:   PainSc: 0-No pain                 Liliann File P Zamauri Nez      "

## 2024-08-21 NOTE — H&P (Signed)
 "  CC: Here today for surgery  HPI: Justin Wallace is an 79 y.o. male with history of Celiac disease, Lymphoma, HIV, whom is seen in the office today as a referral by Dr. Desiderio for evaluation of perianal warts, possible cyst.   He reports a 2 to 57-month history of perianal lesion/mass that he has noticed that is somewhat uncomfortable and hurts particular with wiping. He has noticed it for many months prior but it has been increasing in size and symptoms over the last 2 to 3 months.  He denies any blood in his stool. He does report that he is followed previously with a gastroenterologist in [place]. He reports he had a colonoscopy 5+ years ago but is not sure of the findings but does not believe he ever had any polyps or other abnormal findings. He has followed locally with GI at [facility]-Dr. Therisa. Has not had a colonoscopy as far as I can tell but has had upper endoscopies.  CD4 ct 378 (08/2023) VL undetectable (08/2023) Follows with ID for mgmt of HIV -on Biktarvy    PMH: Celiac disease, Lymphoma, HIV  PSH: Denies any prior anorectal surgeries or procedures  FHx: Denies any known family history of colorectal, breast, endometrial or ovarian cancer  Social Hx: Denies use of tobacco/EtOH/illicit drug. 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 He is here today by himself.   He denies any changes in health or health history since we met in the office. No new medications/allergies. He states he is ready for surgery today.  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   HIV (human immunodeficiency virus infection) (HCC)    Lyme disease    Lymphoma (HCC)     Past Surgical History:  Procedure Laterality Date   ESOPHAGOGASTRODUODENOSCOPY (EGD) WITH PROPOFOL  N/A 10/03/2021   Procedure: ESOPHAGOGASTRODUODENOSCOPY (EGD) WITH  PROPOFOL ;  Surgeon: Therisa Bi, MD;  Location: [facility] ENDOSCOPY;  Service: Gastroenterology;  Laterality: N/A;   PORTA CATH INSERTION N/A 04/25/2020   Procedure: PORTA CATH INSERTION;  Surgeon: Marea Selinda RAMAN, MD;  Location: [facility] INVASIVE CV LAB;  Service: Cardiovascular;  Laterality: N/A;    Family History  Problem Relation Age of Onset   Hyperlipidemia Mother    Brain cancer Father     Social:  reports that he has never smoked. He has never used smokeless tobacco. He reports current alcohol use. He reports that he does not use drugs.  Allergies: Allergies[1]  Medications: I have reviewed the patient's current medications.  Results for orders placed or performed during the hospital encounter of 08/21/24 (from the past 48 hours)  CBC with Differential/Platelet     Status: None   Collection Time: 08/21/24  7:02 AM  Result Value Ref Range   WBC 4.9 4.0 - 10.5 K/uL   RBC 4.50 4.22 - 5.81 MIL/uL   Hemoglobin 14.5 13.0 - 17.0 g/dL   HCT 58.3 60.9 - 47.9 %   MCV 92.4 80.0 - 100.0 fL   MCH 32.2 26.0 - 34.0 pg   MCHC 34.9 30.0 - 36.0 g/dL   RDW 86.6 88.4 - 84.4 %   Platelets 172 150 - 400 K/uL   nRBC 0.0 0.0 - 0.2 %   Neutrophils Relative % 56 %   Neutro Abs 2.8 1.7 - 7.7 K/uL   Lymphocytes Relative 29 %   Lymphs  Abs 1.4 0.7 - 4.0 K/uL   Monocytes Relative 11 %   Monocytes Absolute 0.6 0.1 - 1.0 K/uL   Eosinophils Relative 3 %   Eosinophils Absolute 0.1 0.0 - 0.5 K/uL   Basophils Relative 1 %   Basophils Absolute 0.1 0.0 - 0.1 K/uL   Immature Granulocytes 0 %   Abs Immature Granulocytes 0.01 0.00 - 0.07 K/uL    Comment: Performed at [facility], [address] [place], [place] [address]    No results found.   PE Blood pressure 123/67, pulse (!) 56, temperature 97.7 F (36.5 C), temperature source Temporal, resp. rate 18, height 5' 9 (1.753 m), weight 81.1 kg, SpO2 97%. Constitutional: NAD; conversant Eyes: Moist conjunctiva; no lid lag; anicteric Lungs: Normal  respiratory effort CV: RRR Psychiatric: Appropriate affect  Results for orders placed or performed during the hospital encounter of 08/21/24 (from the past 48 hours)  CBC with Differential/Platelet     Status: None   Collection Time: 08/21/24  7:02 AM  Result Value Ref Range   WBC 4.9 4.0 - 10.5 K/uL   RBC 4.50 4.22 - 5.81 MIL/uL   Hemoglobin 14.5 13.0 - 17.0 g/dL   HCT 58.3 60.9 - 47.9 %   MCV 92.4 80.0 - 100.0 fL   MCH 32.2 26.0 - 34.0 pg   MCHC 34.9 30.0 - 36.0 g/dL   RDW 86.6 88.4 - 84.4 %   Platelets 172 150 - 400 K/uL   nRBC 0.0 0.0 - 0.2 %   Neutrophils Relative % 56 %   Neutro Abs 2.8 1.7 - 7.7 K/uL   Lymphocytes Relative 29 %   Lymphs Abs 1.4 0.7 - 4.0 K/uL   Monocytes Relative 11 %   Monocytes Absolute 0.6 0.1 - 1.0 K/uL   Eosinophils Relative 3 %   Eosinophils Absolute 0.1 0.0 - 0.5 K/uL   Basophils Relative 1 %   Basophils Absolute 0.1 0.0 - 0.1 K/uL   Immature Granulocytes 0 %   Abs Immature Granulocytes 0.01 0.00 - 0.07 K/uL    Comment: Performed at [facility], [address] [place], [place] [address]    No results found.  A/P: Justin Wallace is an 79 y.o. male with hx of Celiac disease, Lymphoma, HIV here for evaluation of right perianal mass and additionally perianal warts  - Referral to [facility]-Dr. Therisa or one of his partners for colonoscopy - We discussed the anatomy and physiology of the anorectal region and pathophysiology of anal masses and condyloma with associated illustrations using the American Society of Colon and Rectal Surgery trifold handout on anal warts - We have reviewed options going forward including further observation (with risk of progression and/or development of her lack to diagnose perianal skin cancer) vs surgery - excision of anal margin mass (2 x 2 cm); excision of perianal warts; anorectal exam under anesthesia - The planned procedure, material risks (including, but not limited to, pain, bleeding, infection, scarring, need for  blood transfusion, damage to anal sphincter, incontinence of gas and/or stool, need for additional procedures, anal stenosis, rare cases of pelvic sepsis which in severe cases may require things like a colostomy, recurrence, blood clot, pulmonary embolus, pneumonia, heart attack, stroke, death) benefits and alternatives to surgery were discussed at length.  - The patient's questions were answered to his satisfaction, he voiced understanding and elected to proceed with surgery. Additionally, we discussed typical postoperative expectations and the recovery process.    Lonni Pizza, MD  [facility]     [1] No Known Allergies  "

## 2024-08-21 NOTE — Op Note (Signed)
 08/21/2024  8:31 AM  PATIENT:  Justin Wallace  79 y.o. male  Patient Care Team: Zafirov, Clarissa A, MD as PCP - General (Family Medicine) Babara Call, MD as Consulting Physician (Hematology and Oncology)  PRE-OPERATIVE DIAGNOSIS:  Anal lesions  POST-OPERATIVE DIAGNOSIS:  Same  PROCEDURE:   Wide local excision of right lateral anal margin lesion 2 x 2 cm  Excision of left anterior anal margin lesion 1 x 1 cm Excision of external hemorrhoidal tag versus mass, left posterior Anorectal exam under anesthesia  SURGEON:  Surgeon(s): Teresa Lonni HERO, MD  ASSISTANT: OR Staff  ANESTHESIA:   local and general  SPECIMEN:   Right lateral anal margin lesion, short suture superior, long suture lateral Left anterior anal margin lesion  External hemorrhoidal tissue, left posterior  DISPOSITION OF SPECIMEN:  PATHOLOGY  COUNTS:  Sponge, needle, and instrument counts were reported correct x2 at conclusion.  EBL: 2 mL  Drains: none  PLAN OF CARE: Discharge to home after PACU  PATIENT DISPOSITION:  PACU - hemodynamically stable.  OR FINDINGS: 2 separate anal margin lesions were identified on meticulous evaluation of the perianal space.  Wide local excision of the largest lesion was done in the right lateral anal margin and this 1 measured 2 x 2 cm which has a somewhat verrucous like appearance.  Additional excision was performed of the left anterior anal margin lesion which has a somewhat verrucous like appearance and is 1 x 1 cm.  He also had a enlarged external hemorrhoidal tag versus mass in the left posterior position that was also excised for pathologic purposes.  Circumferential anoscopy demonstrates no other evident lesions within the anal canal or distal rectum.  DESCRIPTION: The patient was seen in the pre-op holding area. The risks, benefits, complications, treatment options, and expected outcomes were previously discussed with the patient. The patient agreed with the proposed plan  and has signed the informed consent form. The patient was brought to the operating room by the surgical team, identified as Justin Wallace, and the procedure verified. SCD's were applied. General anesthesia was induced without difficulty. The patient was then rolled onto the OR table in the prone jackknife position.  Pressure points were evaluated and padded.  Benzoin was applied to the buttocks and they were gently taped apart.  He was then prepped and draped in usual sterile fashion. A time out was completed and the above information confirmed and need for preoperative antibiotics.  A perianal block was then created using a dilute mixture of 0.5% Marcaine with epinephrine.  After ascertaining an appropriate level of anesthesia had been achieved, a well lubricated digital rectal exam was performed. This demonstrated no palpable abnormalities.  A Hill-Ferguson anoscope was into the anal canal and circumferential inspection demonstrated normal-appearing anoderm without any evident lesions within the anal canal or distal rectum he does have a small hypertrophic anal papilla noted in the posterior midline.    Externally, he does have 2 discrete evident lesions that have a somewhat verrucous like appearance and are flat.  There is no ulceration.  The largest is in the right lateral position within the anal margin.  Borders are marked with clearly normal tissue with at least 5 mm of surrounding normal skin.  A wide local excision is then carried out of this.  This was done in an elliptical type manner to facilitate subsequent closure.  The lesion is incised sharply and is then deepened down to the level of the ischial rectal fat and excision is  commenced sharply using a scalpel.  Orientation is maintained and the specimen is oriented using silk suture with a long suture in the lateralmost position and a short suture in the superiormost position (cephalad).  The specimen is passed off.  Hemostasis is achieved  electrocautery.  The defect is then closed using interrupted 3-0 chromic sutures without any tension and good apposition of the tissue.  Attention is then directed at the other lesion noted in the left anterior position.  This is also marked with normal borders circumferentially.  The lesion is excised in an elliptical manner in a similar fashion using a scalpel.  The lesion is excised with a full-thickness type excision down to the ischial rectal fat.  This is passed off as a separate specimen.  Hemostasis is then achieved electrocautery.  The wound is closed similarly using 3-0 chromic suture in an interrupted fashion with good apposition of tissue and no tension.  He does have an evident hemorrhoidal tag in the left posterior position which is enlarged and given the other findings on exam today, potentially concerning.  This was elevated with a forcep.  It is then incised sharply and dissected free of the overlying external sphincter muscle.  It is passed off as specimen.  Hemostasis is achieved electrocautery and the resultant defect is closed using interrupted 3-0 chromic suture with good apposition and no tension.  Digital rectal examination then confirms that the anal canal is widely patent.  All of our excisions have actually been fairly remote from the actual intra anal portion of the anal canal.  The area is washed and dried.  All sponge, needle, and instrument counts were reported correct.  The buttocks were untaped and a dressing consisting of 4 x 4's, ABD, and mesh underwear is ultimately placed.  He was rolled back onto a stretcher, awakened from anesthesia, extubated, transported to recovery in satisfactory condition.  DISPOSITION: PACU in satisfactory condition.

## 2024-08-21 NOTE — Anesthesia Procedure Notes (Signed)
 Procedure Name: Intubation Date/Time: 08/21/2024 7:43 AM  Performed by: Pam Macario BROCKS, CRNAPre-anesthesia Checklist: Patient identified, Emergency Drugs available, Suction available, Patient being monitored and Timeout performed Patient Re-evaluated:Patient Re-evaluated prior to induction Oxygen Delivery Method: Circle system utilized Preoxygenation: Pre-oxygenation with 100% oxygen Induction Type: IV induction Ventilation: Mask ventilation without difficulty Laryngoscope Size: Mac and 4 Grade View: Grade II Tube type: Oral Tube size: 7.5 mm Number of attempts: 1 Airway Equipment and Method: Stylet and Oral airway Placement Confirmation: ETT inserted through vocal cords under direct vision, positive ETCO2, breath sounds checked- equal and bilateral and CO2 detector Secured at: 22 cm Tube secured with: Tape Dental Injury: Teeth and Oropharynx as per pre-operative assessment

## 2024-08-21 NOTE — Discharge Instructions (Addendum)
 ANORECTAL SURGERY: POST OP INSTRUCTIONS  DIET: Follow a light bland diet the first 24 hours after arrival home, such as soup, liquids, crackers, etc.  Be sure to include lots of fluids daily.  Avoid fast food or heavy meals as your are more likely to get nauseated.  Eat a low fat diet the next few days after surgery.   Some bleeding with bowel movements is expected for the first couple of days but this should stop in between bowel movements  Take your usually prescribed home medications unless otherwise directed. No foreign bodies per rectum for the next 3 months (enemas, etc)  PAIN CONTROL: It is helpful to take an over-the-counter pain medication regularly for the first few days/weeks.  Choose from the following that works best for you: Ibuprofen (Advil, etc) Three 200mg  tabs every 6 hours as needed. Acetaminophen  (Tylenol , etc) 500-650mg  every 6 hours as needed NOTE: You may take both of these medications together - most patients find it most helpful when alternating between the two (i.e. Ibuprofen at 6am, tylenol  at 9am, ibuprofen at 12pm ..SABRA) A  prescription for pain medication may have been prescribed for you at discharge.  Take your pain medication as prescribed.  If you are having problems/concerns with the prescription medicine, please call us  for further advice.  Avoid getting constipated.  Between the surgery and the pain medications, it is common to experience some constipation.  Increasing fluid intake (64oz of water per day) and taking a fiber supplement (such as Metamucil, Citrucel, FiberCon) 1-2 times a day regularly will usually help prevent this problem from occurring.  Take Miralax (over the counter) 1-2x/day while taking a narcotic pain medication. If no bowel movement after 48hours, you may additionally take a laxative like a bottle of Milk of Magnesia which can be purchased over the counter. Avoid enemas.   Watch out for diarrhea.  If you have many loose bowel movements,  simplify your diet to bland foods.  Stop any stool softeners and decrease your fiber supplement. If this worsens or does not improve, please call us .  Wash / shower every day.  If you were discharged with a dressing, you may remove this the day after your surgery. You may shower normally, getting soap/water on your wound, particularly after bowel movements.  Soaking in a warm bath filled a couple inches (Sitz bath) is a great way to clean the area after a bowel movement and many patients find it is a way to soothe the area.  ACTIVITIES as tolerated:   You may resume regular (light) daily activities beginning the next day--such as daily self-care, walking, climbing stairs--gradually increasing activities as tolerated.  If you can walk 30 minutes without difficulty, it is safe to try more intense activity such as jogging, treadmill, bicycling, low-impact aerobics, etc. Refrain from any heavy lifting or straining for the first 2 weeks after your procedure, particularly if your surgery was for hemorrhoids. Avoid activities that make your pain worse You may drive when you are no longer taking prescription pain medication, you can comfortably wear a seatbelt, and you can safely maneuver your car and apply brakes.  FOLLOW UP in our office Please call [facility] at [phone] to set up an appointment to see your surgeon in the office for a follow-up appointment approximately 2 weeks after your surgery. Make sure that you call for this appointment the day you arrive home to insure a convenient appointment time.  9. If you have disability or family leave forms  that need to be completed, you may have them completed by your primary care physician's office; for return to work instructions, please ask our office staff and they will be happy to assist you in obtaining this documentation   When to call us  [phone]: Poor pain control Reactions / problems with new medications (rash/itching, etc)  Fever over  101.5 F (38.5 C) Inability to urinate Nausea/vomiting Worsening swelling or bruising Continued bleeding from incision. Increased pain, redness, or drainage from the incision  The clinic staff is available to answer your questions during regular business hours (8:30am-5pm).  Please dont hesitate to call and ask to speak to one of our nurses for clinical concerns.   A surgeon from [facility] is always on call at the hospitals   If you have a medical emergency, go to the nearest emergency room or call 911.   [facility] [address], [address], [place], [place]  [address] MAIN: [phone] FAX: [phone] [email or web address]    Post Anesthesia Home Care Instructions  Activity: Get plenty of rest for the remainder of the day. A responsible individual must stay with you for 24 hours following the procedure.  For the next 24 hours, DO NOT: -Drive a car -Advertising copywriter -Drink alcoholic beverages -Take any medication unless instructed by your physician -Make any legal decisions or sign important papers.  Meals: Start with liquid foods such as gelatin or soup. Progress to regular foods as tolerated. Avoid greasy, spicy, heavy foods. If nausea and/or vomiting occur, drink only clear liquids until the nausea and/or vomiting subsides. Call your physician if vomiting continues.  Special Instructions/Symptoms: Your throat may feel dry or sore from the anesthesia or the breathing tube placed in your throat during surgery. If this causes discomfort, gargle with warm salt water. The discomfort should disappear within 24 hours.  Tylenol  can be taken after 1 pm if needed  Ibuprofen can be taken after 2:30 pm if needed

## 2024-08-21 NOTE — Transfer of Care (Signed)
 Immediate Anesthesia Transfer of Care Note  Patient: Justin Wallace  Procedure(s) Performed: EXCISION, LESION, ANUS (Anus) EXAM UNDER ANESTHESIA, RECTUM (Anus)  Patient Location: PACU  Anesthesia Type:General  Level of Consciousness: awake and alert   Airway & Oxygen Therapy: Patient Spontanous Breathing and Patient connected to face mask oxygen  Post-op Assessment: Report given to RN and Post -op Vital signs reviewed and stable  Post vital signs: Reviewed and stable  Last Vitals:  Vitals Value Taken Time  BP 125/64 08/21/24 08:39  Temp    Pulse 64 08/21/24 08:41  Resp 19 08/21/24 08:41  SpO2 99 % 08/21/24 08:41  Vitals shown include unfiled device data.  Last Pain:  Vitals:   08/21/24 0652  TempSrc: Temporal  PainSc: 0-No pain         Complications: No notable events documented.

## 2024-08-22 ENCOUNTER — Encounter (HOSPITAL_BASED_OUTPATIENT_CLINIC_OR_DEPARTMENT_OTHER): Payer: Self-pay | Admitting: Surgery

## 2024-08-26 ENCOUNTER — Ambulatory Visit: Payer: Self-pay | Admitting: Surgery

## 2024-08-26 LAB — SURGICAL PATHOLOGY

## 2024-08-27 ENCOUNTER — Other Ambulatory Visit: Payer: Self-pay

## 2024-08-31 ENCOUNTER — Other Ambulatory Visit (HOSPITAL_COMMUNITY): Payer: Self-pay

## 2024-08-31 ENCOUNTER — Other Ambulatory Visit: Payer: Self-pay

## 2024-08-31 ENCOUNTER — Telehealth: Payer: Self-pay | Admitting: Internal Medicine

## 2024-08-31 DIAGNOSIS — B20 Human immunodeficiency virus [HIV] disease: Secondary | ICD-10-CM

## 2024-08-31 MED ORDER — BIKTARVY 50-200-25 MG PO TABS: 1.0000 | ORAL_TABLET | Freq: Every day | ORAL | 11 refills | Status: AC

## 2024-08-31 NOTE — Progress Notes (Unsigned)
"    ° ° °  Patient ID: Justin Wallace, male   DOB: [birth year], 79 y.o.   MRN: [id number]  HPI  [facility] -   Dr arlyss- in [place].for squamous cell ca screening   Outpatient Encounter Medications as of 08/31/2024  Medication Sig   bictegravir-emtricitabine -tenofovir  AF (BIKTARVY ) 50-200-25 MG TABS tablet TAKE 1 TABLET BY MOUTH DAILY.   No facility-administered encounter medications on file as of 08/31/2024.     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   Lab Results  Component Value Date   CD4TCELL 22 (L) 09/03/2023   Lab Results  Component Value Date   CD4TABS 378 (L) 09/03/2023   CD4TABS 333 (L) 08/22/2022   CD4TABS 382 (L) 02/08/2022   Lab Results  Component Value Date   HIV1RNAQUANT <20 (H) 09/03/2023   Lab Results  Component Value Date   HEPBSAB NON-REACTIVE 05/05/2020   Lab Results  Component Value Date   LABRPR NON-REACTIVE 02/08/2022    CBC Lab Results  Component Value Date   WBC 4.9 08/21/2024   RBC 4.50 08/21/2024   HGB 14.5 08/21/2024   HCT 41.6  08/21/2024   PLT 172 08/21/2024   MCV 92.4 08/21/2024   MCH 32.2 08/21/2024   MCHC 34.9 08/21/2024   RDW 13.3 08/21/2024   LYMPHSABS 1.4 08/21/2024   MONOABS 0.6 08/21/2024   EOSABS 0.1 08/21/2024    BMET Lab Results  Component Value Date   NA 142 08/21/2024   K 4.2 08/21/2024   CL 108 08/21/2024   CO2 24 08/21/2024   GLUCOSE 107 (H) 08/21/2024   BUN 24 (H) 08/21/2024   CREATININE 1.09 08/21/2024   CALCIUM 9.3 08/21/2024   GFRNONAA >60 08/21/2024   GFRAA 102 10/10/2020      Assessment and Plan  He will come in for hiv labs this week Will give 12 month refills   "

## 2024-08-31 NOTE — Progress Notes (Signed)
 Patient is getting his Biktarvy  filled at [facility]

## 2024-09-02 ENCOUNTER — Other Ambulatory Visit

## 2024-09-02 ENCOUNTER — Other Ambulatory Visit: Payer: Self-pay

## 2024-09-02 DIAGNOSIS — B20 Human immunodeficiency virus [HIV] disease: Secondary | ICD-10-CM

## 2024-09-02 DIAGNOSIS — Z79899 Other long term (current) drug therapy: Secondary | ICD-10-CM

## 2024-09-04 LAB — COMPLETE METABOLIC PANEL WITHOUT GFR
AG Ratio: 1.8 (calc) (ref 1.0–2.5)
ALT: 11 U/L (ref 9–46)
AST: 16 U/L (ref 10–35)
Albumin: 4.4 g/dL (ref 3.6–5.1)
Alkaline phosphatase (APISO): 74 U/L (ref 35–144)
BUN: 24 mg/dL (ref 7–25)
CO2: 28 mmol/L (ref 20–32)
Calcium: 9.5 mg/dL (ref 8.6–10.3)
Chloride: 106 mmol/L (ref 98–110)
Creat: 1.19 mg/dL (ref 0.70–1.28)
Globulin: 2.5 g/dL (ref 1.9–3.7)
Glucose, Bld: 97 mg/dL (ref 65–99)
Potassium: 4.3 mmol/L (ref 3.5–5.3)
Sodium: 141 mmol/L (ref 135–146)
Total Bilirubin: 0.6 mg/dL (ref 0.2–1.2)
Total Protein: 6.9 g/dL (ref 6.1–8.1)

## 2024-09-04 LAB — CBC WITH DIFFERENTIAL/PLATELET
Absolute Lymphocytes: 1625 {cells}/uL (ref 850–3900)
Absolute Monocytes: 470 {cells}/uL (ref 200–950)
Basophils Absolute: 49 {cells}/uL (ref 0–200)
Basophils Relative: 0.9 %
Eosinophils Absolute: 119 {cells}/uL (ref 15–500)
Eosinophils Relative: 2.2 %
HCT: 41.1 % (ref 39.4–51.1)
Hemoglobin: 13.9 g/dL (ref 13.2–17.1)
MCH: 32.3 pg (ref 27.0–33.0)
MCHC: 33.8 g/dL (ref 31.6–35.4)
MCV: 95.6 fL (ref 81.4–101.7)
MPV: 9.9 fL (ref 7.5–12.5)
Monocytes Relative: 8.7 %
Neutro Abs: 3137 {cells}/uL (ref 1500–7800)
Neutrophils Relative %: 58.1 %
Platelets: 203 10*3/uL (ref 140–400)
RBC: 4.3 Million/uL (ref 4.20–5.80)
RDW: 13.4 % (ref 11.0–15.0)
Total Lymphocyte: 30.1 %
WBC: 5.4 10*3/uL (ref 3.8–10.8)

## 2024-09-04 LAB — T-HELPER CELL (CD4) - (RCID CLINIC ONLY)
CD4 % Helper T Cell: 23 % — ABNORMAL LOW (ref 33–65)
CD4 T Cell Abs: 347 /uL — ABNORMAL LOW (ref 400–1790)

## 2024-09-04 LAB — HIV-1 RNA QUANT-NO REFLEX-BLD
HIV 1 RNA Quant: NOT DETECTED {copies}/mL
HIV-1 RNA Quant, Log: NOT DETECTED {Log_copies}/mL
